# Patient Record
Sex: Female | Born: 1985
Health system: Southern US, Community
[De-identification: ages and names within clinical notes are randomized; demographics above are authoritative.]

## PROBLEM LIST (undated history)

## (undated) ENCOUNTER — Inpatient Hospital Stay (HOSPITAL_COMMUNITY): Payer: Self-pay

## (undated) DIAGNOSIS — IMO0002 Reserved for concepts with insufficient information to code with codable children: Secondary | ICD-10-CM

## (undated) DIAGNOSIS — G621 Alcoholic polyneuropathy: Secondary | ICD-10-CM

## (undated) DIAGNOSIS — Z8489 Family history of other specified conditions: Secondary | ICD-10-CM

## (undated) DIAGNOSIS — F32A Depression, unspecified: Secondary | ICD-10-CM

## (undated) DIAGNOSIS — Z8042 Family history of malignant neoplasm of prostate: Secondary | ICD-10-CM

## (undated) DIAGNOSIS — R000 Tachycardia, unspecified: Secondary | ICD-10-CM

## (undated) DIAGNOSIS — F1090 Alcohol use, unspecified, uncomplicated: Secondary | ICD-10-CM

## (undated) DIAGNOSIS — Z800 Family history of malignant neoplasm of digestive organs: Secondary | ICD-10-CM

## (undated) DIAGNOSIS — F419 Anxiety disorder, unspecified: Secondary | ICD-10-CM

## (undated) DIAGNOSIS — K219 Gastro-esophageal reflux disease without esophagitis: Secondary | ICD-10-CM

## (undated) DIAGNOSIS — R51 Headache: Secondary | ICD-10-CM

## (undated) DIAGNOSIS — B009 Herpesviral infection, unspecified: Secondary | ICD-10-CM

## (undated) DIAGNOSIS — G610 Guillain-Barre syndrome: Secondary | ICD-10-CM

## (undated) DIAGNOSIS — Z803 Family history of malignant neoplasm of breast: Secondary | ICD-10-CM

## (undated) DIAGNOSIS — R87619 Unspecified abnormal cytological findings in specimens from cervix uteri: Secondary | ICD-10-CM

## (undated) DIAGNOSIS — Z973 Presence of spectacles and contact lenses: Secondary | ICD-10-CM

## (undated) DIAGNOSIS — S8980XA Other specified injuries of unspecified lower leg, initial encounter: Secondary | ICD-10-CM

## (undated) DIAGNOSIS — K589 Irritable bowel syndrome without diarrhea: Secondary | ICD-10-CM

## (undated) DIAGNOSIS — F988 Other specified behavioral and emotional disorders with onset usually occurring in childhood and adolescence: Secondary | ICD-10-CM

## (undated) HISTORY — DX: Guillain-Barre syndrome: G61.0

## (undated) HISTORY — DX: Herpesviral infection, unspecified: B00.9

## (undated) HISTORY — DX: Family history of malignant neoplasm of prostate: Z80.42

## (undated) HISTORY — DX: Anxiety disorder, unspecified: F41.9

## (undated) HISTORY — PX: BREAST SURGERY: SHX581

## (undated) HISTORY — DX: Other specified behavioral and emotional disorders with onset usually occurring in childhood and adolescence: F98.8

## (undated) HISTORY — DX: Unspecified abnormal cytological findings in specimens from cervix uteri: R87.619

## (undated) HISTORY — PX: WISDOM TOOTH EXTRACTION: SHX21

## (undated) HISTORY — DX: Family history of malignant neoplasm of digestive organs: Z80.0

## (undated) HISTORY — DX: Reserved for concepts with insufficient information to code with codable children: IMO0002

## (undated) HISTORY — DX: Other specified injuries of unspecified lower leg, initial encounter: S89.80XA

## (undated) HISTORY — DX: Tachycardia, unspecified: R00.0

## (undated) HISTORY — DX: Irritable bowel syndrome, unspecified: K58.9

## (undated) HISTORY — PX: BREAST BIOPSY: SHX20

## (undated) HISTORY — DX: Headache: R51

## (undated) HISTORY — DX: Family history of malignant neoplasm of breast: Z80.3

---
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
## 2003-09-29 ENCOUNTER — Other Ambulatory Visit: Admission: RE | Admit: 2003-09-29 | Discharge: 2003-09-29 | Payer: Self-pay | Admitting: Family Medicine

## 2004-04-02 ENCOUNTER — Emergency Department (HOSPITAL_COMMUNITY): Admission: EM | Admit: 2004-04-02 | Discharge: 2004-04-02 | Payer: Self-pay | Admitting: Family Medicine

## 2004-07-14 ENCOUNTER — Emergency Department (HOSPITAL_COMMUNITY): Admission: EM | Admit: 2004-07-14 | Discharge: 2004-07-15 | Payer: Self-pay | Admitting: Emergency Medicine

## 2004-07-20 ENCOUNTER — Encounter: Admission: RE | Admit: 2004-07-20 | Discharge: 2004-07-20 | Payer: Self-pay | Admitting: Family Medicine

## 2004-11-17 ENCOUNTER — Emergency Department (HOSPITAL_COMMUNITY): Admission: EM | Admit: 2004-11-17 | Discharge: 2004-11-17 | Payer: Self-pay | Admitting: Family Medicine

## 2005-01-24 ENCOUNTER — Ambulatory Visit: Payer: Self-pay | Admitting: Internal Medicine

## 2005-01-25 ENCOUNTER — Ambulatory Visit: Payer: Self-pay | Admitting: Internal Medicine

## 2005-02-08 ENCOUNTER — Ambulatory Visit: Payer: Self-pay | Admitting: Internal Medicine

## 2005-08-18 ENCOUNTER — Other Ambulatory Visit: Admission: RE | Admit: 2005-08-18 | Discharge: 2005-08-18 | Payer: Self-pay | Admitting: Family Medicine

## 2006-08-22 ENCOUNTER — Ambulatory Visit (HOSPITAL_COMMUNITY): Admission: RE | Admit: 2006-08-22 | Discharge: 2006-08-22 | Payer: Self-pay | Admitting: Family Medicine

## 2006-08-22 ENCOUNTER — Emergency Department (HOSPITAL_COMMUNITY): Admission: EM | Admit: 2006-08-22 | Discharge: 2006-08-22 | Payer: Self-pay | Admitting: Family Medicine

## 2006-09-05 ENCOUNTER — Other Ambulatory Visit: Admission: RE | Admit: 2006-09-05 | Discharge: 2006-09-05 | Payer: Self-pay | Admitting: Family Medicine

## 2006-10-11 ENCOUNTER — Emergency Department (HOSPITAL_COMMUNITY): Admission: EM | Admit: 2006-10-11 | Discharge: 2006-10-11 | Payer: Self-pay | Admitting: Family Medicine

## 2007-09-04 ENCOUNTER — Inpatient Hospital Stay (HOSPITAL_COMMUNITY): Admission: EM | Admit: 2007-09-04 | Discharge: 2007-09-06 | Payer: Self-pay | Admitting: Emergency Medicine

## 2007-09-04 ENCOUNTER — Ambulatory Visit: Payer: Self-pay | Admitting: Internal Medicine

## 2008-02-11 ENCOUNTER — Other Ambulatory Visit: Admission: RE | Admit: 2008-02-11 | Discharge: 2008-02-11 | Payer: Self-pay | Admitting: Family Medicine

## 2009-04-27 ENCOUNTER — Emergency Department (HOSPITAL_COMMUNITY): Admission: EM | Admit: 2009-04-27 | Discharge: 2009-04-27 | Payer: Self-pay | Admitting: Emergency Medicine

## 2009-05-01 ENCOUNTER — Other Ambulatory Visit: Admission: RE | Admit: 2009-05-01 | Discharge: 2009-05-01 | Payer: Self-pay | Admitting: Family Medicine

## 2009-06-23 ENCOUNTER — Encounter: Admission: RE | Admit: 2009-06-23 | Discharge: 2009-06-23 | Payer: Self-pay | Admitting: Family Medicine

## 2010-01-28 ENCOUNTER — Emergency Department (HOSPITAL_COMMUNITY): Admission: EM | Admit: 2010-01-28 | Discharge: 2010-01-29 | Payer: Self-pay | Admitting: Emergency Medicine

## 2010-04-06 ENCOUNTER — Other Ambulatory Visit: Admission: RE | Admit: 2010-04-06 | Discharge: 2010-04-06 | Payer: Self-pay | Admitting: Family Medicine

## 2010-06-01 ENCOUNTER — Ambulatory Visit: Payer: Self-pay | Admitting: Gynecology

## 2010-08-27 ENCOUNTER — Emergency Department (HOSPITAL_COMMUNITY): Admission: EM | Admit: 2010-08-27 | Discharge: 2010-08-27 | Payer: Self-pay | Admitting: Family Medicine

## 2010-10-18 ENCOUNTER — Ambulatory Visit: Payer: Self-pay | Admitting: Gynecology

## 2010-10-18 ENCOUNTER — Other Ambulatory Visit
Admission: RE | Admit: 2010-10-18 | Discharge: 2010-10-18 | Payer: Self-pay | Source: Home / Self Care | Admitting: Gynecology

## 2010-11-08 ENCOUNTER — Ambulatory Visit
Admission: RE | Admit: 2010-11-08 | Discharge: 2010-11-08 | Payer: Self-pay | Source: Home / Self Care | Attending: Gynecology | Admitting: Gynecology

## 2010-11-11 ENCOUNTER — Ambulatory Visit: Admit: 2010-11-11 | Payer: Self-pay | Admitting: Gynecology

## 2010-11-17 ENCOUNTER — Ambulatory Visit
Admission: RE | Admit: 2010-11-17 | Discharge: 2010-11-17 | Payer: Self-pay | Source: Home / Self Care | Attending: Gynecology | Admitting: Gynecology

## 2011-02-14 LAB — URINALYSIS, ROUTINE W REFLEX MICROSCOPIC
Bilirubin Urine: NEGATIVE
Glucose, UA: NEGATIVE mg/dL
Ketones, ur: NEGATIVE mg/dL
Leukocytes, UA: NEGATIVE
Nitrite: NEGATIVE
Protein, ur: NEGATIVE mg/dL
Specific Gravity, Urine: 1.002 — ABNORMAL LOW (ref 1.005–1.030)
Urobilinogen, UA: 0.2 mg/dL (ref 0.0–1.0)
pH: 5.5 (ref 5.0–8.0)

## 2011-02-14 LAB — DIFFERENTIAL
Basophils Absolute: 0.1 K/uL (ref 0.0–0.1)
Basophils Relative: 1 % (ref 0–1)
Eosinophils Absolute: 0.4 K/uL (ref 0.0–0.7)
Eosinophils Relative: 5 % (ref 0–5)
Lymphocytes Relative: 37 % (ref 12–46)
Lymphs Abs: 3.6 10*3/uL (ref 0.7–4.0)
Monocytes Absolute: 0.4 10*3/uL (ref 0.1–1.0)
Monocytes Relative: 5 % (ref 3–12)
Neutro Abs: 5.1 10*3/uL (ref 1.7–7.7)
Neutrophils Relative %: 53 % (ref 43–77)

## 2011-02-14 LAB — CBC
HCT: 42.9 % (ref 36.0–46.0)
Hemoglobin: 14.7 g/dL (ref 12.0–15.0)
MCHC: 34.2 g/dL (ref 30.0–36.0)
MCV: 98.7 fL (ref 78.0–100.0)
Platelets: 259 10*3/uL (ref 150–400)
RBC: 4.34 MIL/uL (ref 3.87–5.11)
RDW: 12.4 % (ref 11.5–15.5)
WBC: 9.6 K/uL (ref 4.0–10.5)

## 2011-02-14 LAB — COMPREHENSIVE METABOLIC PANEL
Albumin: 4.2 g/dL (ref 3.5–5.2)
Alkaline Phosphatase: 54 U/L (ref 39–117)
BUN: 8 mg/dL (ref 6–23)
Calcium: 9.4 mg/dL (ref 8.4–10.5)
Glucose, Bld: 96 mg/dL (ref 70–99)
Potassium: 3.7 mEq/L (ref 3.5–5.1)
Sodium: 144 mEq/L (ref 135–145)
Total Protein: 7.1 g/dL (ref 6.0–8.3)

## 2011-02-14 LAB — PROTIME-INR
INR: 1 (ref 0.00–1.49)
Prothrombin Time: 12.8 s (ref 11.6–15.2)

## 2011-02-14 LAB — URINE MICROSCOPIC-ADD ON

## 2011-02-14 LAB — POCT I-STAT, CHEM 8
Calcium, Ion: 1.14 mmol/L (ref 1.12–1.32)
Creatinine, Ser: 0.8 mg/dL (ref 0.4–1.2)
Glucose, Bld: 96 mg/dL (ref 70–99)
HCT: 45 % (ref 36.0–46.0)
Hemoglobin: 15.3 g/dL — ABNORMAL HIGH (ref 12.0–15.0)
TCO2: 22 mmol/L (ref 0–100)

## 2011-02-14 LAB — RAPID URINE DRUG SCREEN, HOSP PERFORMED
Amphetamines: NOT DETECTED
Barbiturates: NOT DETECTED
Benzodiazepines: POSITIVE — AB
Cocaine: NOT DETECTED
Opiates: NOT DETECTED
Tetrahydrocannabinol: NOT DETECTED

## 2011-02-14 LAB — COMPREHENSIVE METABOLIC PANEL WITH GFR
ALT: 15 U/L (ref 0–35)
AST: 20 U/L (ref 0–37)
CO2: 23 meq/L (ref 19–32)
Chloride: 113 meq/L — ABNORMAL HIGH (ref 96–112)
Creatinine, Ser: 0.72 mg/dL (ref 0.4–1.2)
GFR calc Af Amer: >60 mL/min (ref >60)
GFR calc non Af Amer: >60 mL/min (ref >60)
Total Bilirubin: 0.4 mg/dL (ref 0.3–1.2)

## 2011-02-14 LAB — PREGNANCY, URINE: Preg Test, Ur: NEGATIVE

## 2011-02-14 LAB — GLUCOSE, CAPILLARY: Glucose-Capillary: 92 mg/dL (ref 70–99)

## 2011-02-14 LAB — ETHANOL: Alcohol, Ethyl (B): 230 mg/dL — ABNORMAL HIGH (ref 0–10)

## 2011-02-14 LAB — APTT: aPTT: 28 seconds (ref 24–37)

## 2011-03-22 NOTE — Discharge Summary (Signed)
NAMEELEXUS, BARMAN            ACCOUNT NO.:  [id number]   MEDICAL RECORD NO.:  [id number]          PATIENT TYPE:  INP   LOCATION:  1231                         FACILITY:  [facility]   PHYSICIAN:  Corinna L. Lendell Caprice, MDDATE OF BIRTH:  1986/02/04   DATE OF ADMISSION:  09/04/2007  DATE OF DISCHARGE:  09/06/2007                               DISCHARGE SUMMARY   DISCHARGE DIAGNOSES:  1. Accidental overdose of Klonopin.  2. Ventilator-dependent respiratory failure secondary to above.  3. History of migraines.  4. History of attention-deficit disorder. (ADD)  5. History of panic attacks.  6. Urinary tract infection.   DISCHARGE MEDICATIONS:  1. Imitrex 50 mg as needed for migraine.  2. I have recommended that she call her psychiatrist and taper down or      off her Klonopin, but I have instructed her not to stop this      without the assistance of a physician due to likelihood of seizure.  3. Ciprofloxacin 500 mg p.o. b.i.d. until gone.   FOLLOW UP:  Follow up with psychiatrist, see above.  Follow up with Dr.  Valentina Lucks and/or neurologist of choice for migraines.   CONDITION ON DISCHARGE:  Stable.   CONSULTATIONS:  Critical care medicine.   PROCEDURES:  Intubation with mechanical ventilation.   DIET:  Regular.   ACTIVITY:  Ad lib.   LABORATORY DATA AND X-RAY FINDINGS:  Urine drug screen positive for  opiates, benzodiazepines and amphetamines.  Blood alcohol less than 5.  Initial ABG revealed a pH of 7.355, pCO2 41, pO2 79.  CBC on admission  significant for white blood cell count of 15,000, otherwise  unremarkable.  PT/PTT unremarkable.  Complete metabolic panel  unremarkable.  Urine pregnancy negative.  Acetaminophen and salicylate  levels negligible.  Urinalysis revealed positive nitrite, negative  leukocyte esterase, many bacteria, 0-2 wbc's.   CT of brain was normal.  Chest x-ray showed mild bibasilar atelectasis.  EKG showed normal sinus rhythm with low voltage.   HISTORY OF PRESENT ILLNESS:  Please see H&P for complete admission  details.  The patient is a 25 year old, white female who usually sees  Dr. Maurice Small or Corky Mull and was brought to [facility] with a migraine.  She has been suffering from headaches with  photophobia and nausea, but has not seen her primary care physician or  anyone else except for urgent care for this.  She had a migraine and had  taken a Vicodin a few days ago.  She takes Klonopin, but does not recall  taking this other than as prescribed.  She was prescribed Adderall, but  does not take it regularly, according to her mother.  The patient was  noted to be somnolent at the Urgent Care Center and was therefore  referred to the emergency room.  She had normal vital signs and she  became progressively more obtunded and was intubated by the ER physician  to protect her airway.  The EMS notes that she had filled the Klonopin  recently and there were 30 tablets missing.  Critical care was consulted  for vent management.  HOSPITAL COURSE:  The patient was able to be extubated very quickly and  after extubation, she denied depression or suicidal ideation.  She has  never had a history of depression or suicide attempt.  She does suffer  from anxiety and takes Klonopin for this, but she does not recall taking  this improperly.  I questioned whether she may have had a confusional  migraine and took too much Klonopin by accident or possibly the Klonopin  also made her confused.  Apparently, she has been on it daily x3 years.  Due to the severity of this accidental overdose,  I have recommended  that she call her psychiatrist and that they consider tapering down  and/or off her benzodiazepines.  I have also recommended that she follow  up with primary care versus a neurologist for her frequent migraines.   She also was found to have a urinary tract infection and did, in fact,  have dysuria and was started  on Cipro.      Corinna L. Lendell Caprice, MD  Electronically Signed     CLS/MEDQ  D:  09/06/2007  T:  09/07/2007  Job:  [id number]   cc:   Gretta Arab. Valentina Lucks, M.D.  Fax: [phone]

## 2011-03-22 NOTE — H&P (Signed)
Kimberly [name], Kimberly [name]            ACCOUNT NO.:  [id number]   MEDICAL RECORD NO.:  [id number]          PATIENT TYPE:  INP   LOCATION:  1231                         FACILITY:  [facility]   PHYSICIAN:  Della Goo, M.D. DATE OF BIRTH:  [birth year]   DATE OF ADMISSION:  09/04/2007  DATE OF DISCHARGE:                              HISTORY & PHYSICAL   PRIMARY CARE PHYSICIAN:  Dr. Maurice Small, [facility]   CHIEF COMPLAINT:  Unresponsive.   HISTORY OF PRESENT ILLNESS:  This a 25 year old female who was sent  emergently to the emergency department secondary to unresponsiveness  after being evaluated at the [facility].  The  patient has a migraine headache since 2 p.m. and presented to the Urgent  Care Center at 5:30 p.m. for evaluation and treatment.  The patient was  evaluated and treated; however, became increasingly groggy and then  unresponsive.  The patient was sent emergently to the emergency  department at [facility] for further evaluation and treatment.  In the emergency department, the patient was found to have stable vital  signs and stable O2 saturations without respiratory difficulty. However,  the patient initially was responsive to painful stimuli and  progressively became more unresponsive, then unresponsive to painful  stimuli, prompting concern for the patient's airway management, and the  patient was antedated in the emergency department for airway management.   Per the patient's family, who gives the history, the patient has had  migraines since August 2008, and the patient also has had an anxiety  disorder for many years.  They report that the patient just filled a  prescription for Klonopin 1 day ago.  A pill count was performed by the  evaluating ER physician, and 30 tablets are missing.  It is not known  whether the patient took this medication and if this is an overdose on  benzodiazepines.  The patient was administered  Romazicon x1 dose without  changes in her responsiveness.   PAST MEDICAL HISTORY:  Significant for:  1. Migraine headaches.  2. Allergies.  3. Anxiety disorder.   PAST SURGICAL HISTORY:  None.   MEDICATIONS:  Include Imitrex, Maxalt, Midrin, Zyrtec, and clonazepam.  The patient is also on Depo-Provera injections 150 mg every 3 months.   ALLERGIES:  No known drug allergies.   SOCIAL HISTORY:  The patient works in patient care. She is a smoker.  She is occasional drinker per report of her family.   REVIEW OF SYSTEMS:  Pertinent for mentioned above.   PHYSICAL EXAMINATION:  GENERAL:  This is 26 year old, well-nourished,  well-developed female in acute distress.  VITAL SIGNS:  Temperature 98.9, blood pressure 133/86, heart rate 88,  respirations 16, O2 saturation 98-100%.  HEENT:  Normocephalic, atraumatic.  Pupils are sluggish but reactive to  light. Unable to elicit volitional extraocular movements.  Oropharynx is  clear.  Oral mucosa moist.  No exudates or erythema.  NECK:  Supple.  No jugular venous distention.  No thyromegaly.  CARDIOVASCULAR:  Regular rate and rhythm.  No murmurs, gallops or rubs.  LUNGS:  Clear to auscultation bilaterally.  ABDOMEN:  Positive bowel sounds, soft, nontender, nondistended.  EXTREMITIES:  Without cyanosis, clubbing or edema.  NEUROLOGIC:  Unable to perform at this time secondary to the patient's  obtundation.   LABORATORY STUDIES:  White blood cell count 15.0, hemoglobin 12.9,  hematocrit 38.1, platelets 227, neutrophils 85%, MCV 89.6.  Urine  pregnancy negative.  Pro time 14.2, INR 1.1, PTT 29.  Acetaminophen  level 12.2.  Salicylates less than 4.0. Alcohol level less than 5.  Urine drug screen positive opiates, positive benzodiazepines, positive  amphetamines, negative cocaine, negative THC, and negative barbiturates.  Sodium level 135, potassium 3.8, chloride 105, carbon dioxide 20, BUN 8,  creatinine 0.73, glucose 121, albumin 4.0, AST  16, ALT 12. Urinalysis:  Small urine hemoglobin. positive urine nitrites.  Arterial blood gas  prior to intubation was pH 7.355, pCO2 41.4, pO2 79.2, bicarb 22.6 and  O2 saturation 93.4%.   EKG performed reveals a normal sinus rhythm.   Post intubation, the chest x-ray performed revealed bibasilar  atelectasis.   ASSESSMENT:  This is a 25 year old female being admitted with  1. Altered mental status.  2. Possible Klonopin overdose-benzodiazepine overdose.  3. Urinary tract infection.  4. Bibasilar atelectasis-pneumonia  5. Anxiety disorder.  6. History of environmental allergies.   PLAN:  The patient was intubated in the emergency department for airway  management.  She will be monitored for further neurologic changes.  The  patient has been placed on IV antibiotic therapy of Avelox secondary to  findings of bibasilar atelectasis and also secondary to the findings of  a positive urinary tract infection..  Further evaluation will ensue  pending the patient's clinical course.  The patient has been placed on  DVT and GI prophylaxes for now and has been made n.p.o. while she is  intubated.  IV fluids have been ordered for maintenance therapy.  Pulmonary critical care has been consulted for ventilator management  while the patient is intubated.  Further workup will ensue as mentioned  above.      Della Goo, M.D.  Electronically Signed     HJ/MEDQ  D:  09/05/2007  T:  09/05/2007  Job:  [id number]   cc:   Gretta Arab. Valentina Lucks, M.D.  Fax: [phone]

## 2011-03-22 NOTE — H&P (Signed)
NAMEMICAELA, STITH            ACCOUNT NO.:  [id number]   MEDICAL RECORD NO.:  [id number]          PATIENT TYPE:  INP   LOCATION:  1231                         FACILITY:  [facility]   PHYSICIAN:  Della Goo, M.D. DATE OF BIRTH:  Mar 03, 1986   DATE OF ADMISSION:  09/04/2007  DATE OF DISCHARGE:                              HISTORY & PHYSICAL   ADDENDUM:  A CT scan was performed in the emergency department secondary  to patient's unresponsiveness, results of which were negative for any  acute findings.      Della Goo, M.D.  Electronically Signed     HJ/MEDQ  D:  09/05/2007  T:  09/05/2007  Job:  [id number]   cc:   Gretta Arab. Valentina Lucks, M.D.  Fax: [phone]

## 2011-04-07 ENCOUNTER — Other Ambulatory Visit: Payer: Self-pay | Admitting: Gynecology

## 2011-05-12 ENCOUNTER — Encounter: Payer: Self-pay | Admitting: Family Medicine

## 2011-05-12 DIAGNOSIS — B009 Herpesviral infection, unspecified: Secondary | ICD-10-CM | POA: Insufficient documentation

## 2011-08-17 LAB — BLOOD GAS, ARTERIAL
Acid-base deficit: 2.3 — ABNORMAL HIGH
Bicarbonate: 21.5
Bicarbonate: 22.6
MECHVT: 500
O2 Saturation: 100.1
O2 Saturation: 93.4
PEEP: 5
Patient temperature: 98.6
Patient temperature: 98.7
TCO2: 20.4
pH, Arterial: 7.355

## 2011-08-17 LAB — DIFFERENTIAL
Basophils Absolute: 0
Basophils Relative: 0
Eosinophils Absolute: 0
Neutro Abs: 12.8 — ABNORMAL HIGH
Neutrophils Relative %: 85 — ABNORMAL HIGH

## 2011-08-17 LAB — COMPREHENSIVE METABOLIC PANEL
ALT: 12
Alkaline Phosphatase: 53
BUN: 8
CO2: 20
GFR calc non Af Amer: >60
Glucose, Bld: 121 — ABNORMAL HIGH
Potassium: 3.8
Sodium: 135

## 2011-08-17 LAB — URINALYSIS, ROUTINE W REFLEX MICROSCOPIC
Bilirubin Urine: NEGATIVE
Ketones, ur: NEGATIVE
Nitrite: POSITIVE — AB
Specific Gravity, Urine: 1.03
Urobilinogen, UA: 0.2

## 2011-08-17 LAB — PROTIME-INR
INR: 1.1
Prothrombin Time: 14.2

## 2011-08-17 LAB — CBC
HCT: 38.1
Hemoglobin: 12.9
MCHC: 34
RBC: 4.25

## 2011-08-17 LAB — RAPID URINE DRUG SCREEN, HOSP PERFORMED
Amphetamines: POSITIVE — AB
Barbiturates: NOT DETECTED
Cocaine: NOT DETECTED
Opiates: POSITIVE — AB
Tetrahydrocannabinol: NOT DETECTED

## 2011-08-17 LAB — ACETAMINOPHEN LEVEL: Acetaminophen (Tylenol), Serum: 12.2

## 2011-08-17 LAB — URINE MICROSCOPIC-ADD ON

## 2011-08-17 LAB — ETHANOL: Alcohol, Ethyl (B): <5

## 2012-02-20 ENCOUNTER — Ambulatory Visit (HOSPITAL_COMMUNITY): Payer: PRIVATE HEALTH INSURANCE

## 2012-02-20 ENCOUNTER — Ambulatory Visit: Payer: PRIVATE HEALTH INSURANCE | Admitting: Gynecology

## 2012-02-20 VITALS — BP 107/67 | Wt 138.0 lb

## 2012-02-20 DIAGNOSIS — O3680X0 Pregnancy with inconclusive fetal viability, not applicable or unspecified: Secondary | ICD-10-CM

## 2012-02-20 DIAGNOSIS — Z3400 Encounter for supervision of normal first pregnancy, unspecified trimester: Secondary | ICD-10-CM

## 2012-02-21 ENCOUNTER — Ambulatory Visit (HOSPITAL_COMMUNITY)
Admission: RE | Admit: 2012-02-21 | Discharge: 2012-02-21 | Disposition: A | Discharge status: Home / Self Care | Payer: PRIVATE HEALTH INSURANCE | Source: Ambulatory Visit | Attending: Family Medicine | Admitting: Family Medicine

## 2012-02-21 ENCOUNTER — Other Ambulatory Visit: Payer: Self-pay | Admitting: Family Medicine

## 2012-02-21 DIAGNOSIS — O30049 Twin pregnancy, dichorionic/diamniotic, unspecified trimester: Secondary | ICD-10-CM | POA: Insufficient documentation

## 2012-02-21 DIAGNOSIS — Z3689 Encounter for other specified antenatal screening: Secondary | ICD-10-CM | POA: Insufficient documentation

## 2012-02-21 DIAGNOSIS — O30009 Twin pregnancy, unspecified number of placenta and unspecified number of amniotic sacs, unspecified trimester: Secondary | ICD-10-CM | POA: Insufficient documentation

## 2012-02-21 DIAGNOSIS — O3680X0 Pregnancy with inconclusive fetal viability, not applicable or unspecified: Secondary | ICD-10-CM

## 2012-02-21 LAB — OBSTETRIC PANEL
Antibody Screen: NEGATIVE
Basophils Relative: 1 % (ref 0–1)
Eosinophils Absolute: 0.3 10*3/uL (ref 0.0–0.7)
HCT: 38.5 % (ref 36.0–46.0)
Hemoglobin: 12.8 g/dL (ref 12.0–15.0)
MCH: 29.9 pg (ref 26.0–34.0)
MCHC: 33.2 g/dL (ref 30.0–36.0)
Monocytes Absolute: 0.4 10*3/uL (ref 0.1–1.0)
Monocytes Relative: 6 % (ref 3–12)
Neutrophils Relative %: 57 % (ref 43–77)
Rh Type: NEGATIVE

## 2012-02-21 LAB — HIV ANTIBODY (ROUTINE TESTING W REFLEX): HIV: NONREACTIVE

## 2012-02-22 LAB — CULTURE, URINE COMPREHENSIVE: Colony Count: NO GROWTH

## 2012-02-29 ENCOUNTER — Encounter: Payer: Self-pay | Admitting: Obstetrics and Gynecology

## 2012-02-29 ENCOUNTER — Ambulatory Visit (INDEPENDENT_AMBULATORY_CARE_PROVIDER_SITE_OTHER): Payer: PRIVATE HEALTH INSURANCE | Admitting: Obstetrics and Gynecology

## 2012-02-29 VITALS — BP 103/62 | Wt 136.0 lb

## 2012-02-29 DIAGNOSIS — O30009 Twin pregnancy, unspecified number of placenta and unspecified number of amniotic sacs, unspecified trimester: Secondary | ICD-10-CM

## 2012-02-29 DIAGNOSIS — Z124 Encounter for screening for malignant neoplasm of cervix: Secondary | ICD-10-CM

## 2012-02-29 DIAGNOSIS — I499 Cardiac arrhythmia, unspecified: Secondary | ICD-10-CM

## 2012-02-29 DIAGNOSIS — Z113 Encounter for screening for infections with a predominantly sexual mode of transmission: Secondary | ICD-10-CM

## 2012-02-29 DIAGNOSIS — Z3400 Encounter for supervision of normal first pregnancy, unspecified trimester: Secondary | ICD-10-CM

## 2012-02-29 DIAGNOSIS — O0990 Supervision of high risk pregnancy, unspecified, unspecified trimester: Secondary | ICD-10-CM | POA: Insufficient documentation

## 2012-02-29 MED ORDER — PROMETHAZINE HCL 12.5 MG PO TABS: 12.5000 mg | ORAL_TABLET | Freq: Four times a day (QID) | ORAL | Status: DC | PRN

## 2012-02-29 MED ORDER — DOCUSATE SODIUM 100 MG PO CAPS: 100.0000 mg | ORAL_CAPSULE | Freq: Two times a day (BID) | ORAL | Status: AC | PRN

## 2012-02-29 NOTE — Patient Instructions (Signed)
Pregnancy - First Trimester  During sexual intercourse, millions of sperm go into the vagina. Only 1 sperm will penetrate and fertilize the female egg while it is in the Fallopian tube. One week later, the fertilized egg implants into the wall of the uterus. An embryo begins to develop into a baby. At 6 to 8 weeks, the eyes and face are formed and the heartbeat can be seen on ultrasound. At the end of 12 weeks (first trimester), all the baby's organs are formed. Now that you are pregnant, you will want to do everything you can to have a healthy baby. Two of the most important things are to get good prenatal care and follow your caregiver's instructions. Prenatal care is all the medical care you receive before the baby's birth. It is given to prevent, find, and treat problems during the pregnancy and childbirth.  PRENATAL EXAMS   During prenatal visits, your weight, blood pressure and urine are checked. This is done to make sure you are healthy and progressing normally during the pregnancy.   A pregnant woman should gain 25 to 35 pounds during the pregnancy. However, if you are over weight or underweight, your caregiver will advise you regarding your weight.   Your caregiver will ask and answer questions for you.   Blood work, cervical cultures, other necessary tests and a Pap test are done during your prenatal exams. These tests are done to check on your health and the probable health of your baby. Tests are strongly recommended and done for HIV with your permission. This is the virus that causes AIDS. These tests are done because medications can be given to help prevent your baby from being born with this infection should you have been infected without knowing it. Blood work is also used to find out your blood type, previous infections and follow your blood levels (hemoglobin).   Low hemoglobin (anemia) is common during pregnancy. Iron and vitamins are given to help prevent this. Later in the pregnancy, blood  tests for diabetes will be done along with any other tests if any problems develop. You may need tests to make sure you and the baby are doing well.   You may need other tests to make sure you and the baby are doing well.  CHANGES DURING THE FIRST TRIMESTER (THE FIRST 3 MONTHS OF PREGNANCY)  Your body goes through many changes during pregnancy. They vary from person to person. Talk to your caregiver about changes you notice and are concerned about. Changes can include:   Your menstrual period stops.   The egg and sperm carry the genes that determine what you look like. Genes from you and your partner are forming a baby. The female genes determine whether the baby is a boy or a girl.   Your body increases in girth and you may feel bloated.   Feeling sick to your stomach (nauseous) and throwing up (vomiting). If the vomiting is uncontrollable, call your caregiver.   Your breasts will begin to enlarge and become tender.   Your nipples may stick out more and become darker.   The need to urinate more. Painful urination may mean you have a bladder infection.   Tiring easily.   Loss of appetite.   Cravings for certain kinds of food.   At first, you may gain or lose a couple of pounds.   You may have changes in your emotions from day to day (excited to be pregnant or concerned something may go wrong with   the pregnancy and baby).   You may have more vivid and strange dreams.  HOME CARE INSTRUCTIONS    It is very important to avoid all smoking, alcohol and un-prescribed drugs during your pregnancy. These affect the formation and growth of the baby. Avoid chemicals while pregnant to ensure the delivery of a healthy infant.   Start your prenatal visits by the 12th week of pregnancy. They are usually scheduled monthly at first, then more often in the last 2 months before delivery. Keep your caregiver's appointments. Follow your caregiver's instructions regarding medication use, blood and lab tests, exercise, and  diet.   During pregnancy, you are providing food for you and your baby. Eat regular, well-balanced meals. Choose foods such as meat, fish, milk and other low fat dairy products, vegetables, fruits, and whole-grain breads and cereals. Your caregiver will tell you of the ideal weight gain.   You can help morning sickness by keeping soda crackers at the bedside. Eat a couple before arising in the morning. You may want to use the crackers without salt on them.   Eating 4 to 5 small meals rather than 3 large meals a day also may help the nausea and vomiting.   Drinking liquids between meals instead of during meals also seems to help nausea and vomiting.   A physical sexual relationship may be continued throughout pregnancy if there are no other problems. Problems may be early (premature) leaking of amniotic fluid from the membranes, vaginal bleeding, or belly (abdominal) pain.   Exercise regularly if there are no restrictions. Check with your caregiver or physical therapist if you are unsure of the safety of some of your exercises. Greater weight gain will occur in the last 2 trimesters of pregnancy. Exercising will help:   Control your weight.   Keep you in shape.   Prepare you for labor and delivery.   Help you lose your pregnancy weight after you deliver your baby.   Wear a good support or jogging bra for breast tenderness during pregnancy. This may help if worn during sleep too.   Ask when prenatal classes are available. Begin classes when they are offered.   Do not use hot tubs, steam rooms or saunas.   Wear your seat belt when driving. This protects you and your baby if you are in an accident.   Avoid raw meat, uncooked cheese, cat litter boxes and soil used by cats throughout the pregnancy. These carry germs that can cause birth defects in the baby.   The first trimester is a good time to visit your dentist for your dental health. Getting your teeth cleaned is OK. Use a softer toothbrush and brush  gently during pregnancy.   Ask for help if you have financial, counseling or nutritional needs during pregnancy. Your caregiver will be able to offer counseling for these needs as well as refer you for other special needs.   Do not take any medications or herbs unless told by your caregiver.   Inform your caregiver if there is any mental or physical domestic violence.   Make a list of emergency phone numbers of family, friends, hospital, and police and fire departments.   Write down your questions. Take them to your prenatal visit.   Do not douche.   Do not cross your legs.   If you have to stand for long periods of time, rotate you feet or take small steps in a circle.   You may have more vaginal secretions that may   tampons or scented sanitary pads.  MEDICATIONS AND DRUG USE IN PREGNANCY  Take prenatal vitamins as directed. The vitamin should contain 1 milligram of folic acid. Keep all vitamins out of reach of children. Only a couple vitamins or tablets containing iron may be fatal to a baby or young child when ingested.   Avoid use of all medications, including herbs, over-the-counter medications, not prescribed or suggested by your caregiver. Only take over-the-counter or prescription medicines for pain, discomfort, or fever as directed by your caregiver. Do not use aspirin, ibuprofen, or naproxen unless directed by your caregiver.   Let your caregiver also know about herbs you may be using.   Alcohol is related to a number of birth defects. This includes fetal alcohol syndrome. All alcohol, in any form, should be avoided completely. Smoking will cause low birth rate and premature babies.   Street or illegal drugs are very harmful to the baby. They are absolutely forbidden. A baby born to an addicted mother will be addicted at birth. The baby will go through the same withdrawal an adult does.   Let your  caregiver know about any medications that you have to take and for what reason you take them.  MISCARRIAGE IS COMMON DURING PREGNANCY A miscarriage does not mean you did something wrong. It is not a reason to worry about getting pregnant again. Your caregiver will help you with questions you may have. If you have a miscarriage, you may need minor surgery. SEEK MEDICAL CARE IF:  You have any concerns or worries during your pregnancy. It is better to call with your questions if you feel they cannot wait, rather than worry about them. SEEK IMMEDIATE MEDICAL CARE IF:   An unexplained oral temperature above 100.4 F (38 C) develops, or as your caregiver suggests.   You have leaking of fluid from the vagina (birth canal). If leaking membranes are suspected, take your temperature and inform your caregiver of this when you call.   There is vaginal spotting or bleeding. Notify your caregiver of the amount and how many pads are used.   You develop a bad smelling vaginal discharge with a change in the color.   You continue to feel sick to your stomach (nauseated) and have no relief from remedies suggested. You vomit blood or coffee ground-like materials.   You lose more than 2 pounds of weight in 1 week.   You gain more than 2 pounds of weight in 1 week and you notice swelling of your face, hands, feet, or legs.   You gain 5 pounds or more in 1 week (even if you do not have swelling of your hands, face, legs, or feet).   You get exposed to [place] measles and have never had them.   You are exposed to fifth disease or chickenpox.   You develop belly (abdominal) pain. Round ligament discomfort is a common non-cancerous (benign) cause of abdominal pain in pregnancy. Your caregiver still must evaluate this.   You develop headache, fever, diarrhea, pain with urination, or shortness of breath.   You fall or are in a car accident or have any kind of trauma.   There is mental or physical violence in  your home.  Document Released: 10/18/2001 Document Revised: 10/13/2011 Document Reviewed: 04/21/2009 [facility] Patient Information 2012 [place], [place]. Multiple Pregnancy A multiple pregnancy is when a woman is pregnant with two or more fetuses. Multiple pregnancies occur in about 3% of all births. The more babies in a pregnancy, the  greater the risks of problems to the babies and mother. This includes death. Since the use of Assisted Reproductive Technology (ART) and medications that can induce ovulation, multiple fetal gestation has increased.  RISKS TO THE MOTHER  Preeclampsia and eclampsia.   Postpartum bleeding (hemorrhage).   Kidney infection (pyelonephritis).   Develop anemia.   Develop diabetes.   Liver complications.   A blood clot blocks the artery, or branch of the artery leading to the lungs (pulmonary embolism).   Blood clots in the leg.   Placental separation.   Higher rate of Cesarean Section deliveries.   Women over 68 years old have a higher rate of Downs Syndrome babies.  RISKS TO THE BABY  Preterm labor with a premature baby.   Very low birth weight babies that are less than 3 pounds, especially with triplets or mores.   Premature rupture of the membranes.   Twin to twin blood transfusion with one baby anemic and the other baby with too much blood in its system. There may also be heart failure.   With triplets or more, one of the babies is at high risk for cerebral palsy or other neurologic problem.   There is a higher incidence of fetal death.  CARE OF MOTHERS WITH MULTIPLE FETAL GESTATION Multiple pregnancies need more care and special prenatal care.  You will see your caregiver more often.   You will have more tests including ultrasounds, nonstress tests and blood tests.   You will have special tests done called amniocentesis and a biophysical profile.   You may be hospitalized more often during the pregnancy.   You will be encouraged to eat a  balanced and healthy diet with vitamin and mineral supplements as directed.   You will be asked to get more rest and sleep to keep up your energy.   You will be asked to restrict your daily activities, exercise, work, household chores and sexual activity.   If you have preterm labor with small babies, you will be given a steroid injection to help the babies lungs mature and do better when born.   The delivery may have to be by Cesarean delivery, especially if there are triplets or more.   The delivery should be in a hospital with an intensive care nursery and Neonatologists (pediatrician for high risk babies) to care for the newborn babies.  HOME CARE INSTRUCTIONS   Follow the caregiver's recommendations regarding office visits, tests for you and the babies, diet, rest and medications.   Avoid a large amount of physical activity.   Arrange to have help after the babies are born and when you go home from the hospital.   Take classes on how to care for multiple babies before you deliver them.  SEEK IMMEDIATE MEDICAL CARE IF:   You develop a temperature of 100.4 F (38 C) or higher.   You are leaking fluid from the vagina.   You develop vaginal bleeding.   You develop uterine contractions.   You develop a severe headache, severe upper abdominal pain, visual problems or excessive swelling of your face, hands and feet.   You develop severe back pain or leg pain.   You develop severe tiredness.   You develop chest pain.   You have shortness of breath, fall down or pass out.  Document Released: 08/02/2008 Document Revised: 10/13/2011 Document Reviewed: 08/02/2008 [facility] Patient Information 2012 [place], [place].

## 2012-02-29 NOTE — Progress Notes (Signed)
   Subjective:    Kimberly [name] is a G1P0 [redacted]w[redacted]d being seen today for her first obstetrical visit.  Her obstetrical history is significant for Di-Di twin gestation. Patient also diagnosed with irregular heart beat in the past and was placed on Metoprolol for rate control. Patient self discontinued this medication a few months ago. Patient denies chest pain or shortness of breath. Patient does intend to breast feed. Pregnancy history fully reviewed.  Patient reports some nausea and emesis well controlled with Zofran. Patient is complaining of severe constipation.. Otherwise doing well.  Filed Vitals:   02/29/12 1000  BP: 103/62  Weight: 136 lb (61.689 kg)    HISTORY: OB History    Grav Para Term Preterm Abortions TAB SAB Ect Mult Living   1              # Outc Date GA Lbr Len/2nd Wgt Sex Del Anes PTL Lv   1 CUR              Past Medical History  Diagnosis Date  . HSV-1 infection   . Abnormal Pap smear   . Anxiety   . Headache    Past Surgical History  Procedure Date  . Wisdom tooth extraction     x4   Family History  Problem Relation Age of Onset  . Depression Mother     chronic  . Hearing loss Mother   . Hyperlipidemia Mother   . Vision loss Mother     beachets disease  . COPD Father   . Diabetes Father   . Cancer Maternal Grandmother 20    breast cancer  . Parkinsonism Maternal Grandmother   . Heart disease Maternal Grandmother   . Alzheimer's disease Paternal Grandmother   . Heart disease Paternal Grandfather   . Nephrolithiasis Paternal Grandfather   . Cancer Other 60    breast     Exam    Uterus:     Pelvic Exam:    Perineum: Normal Perineum   Vulva: normal   Vagina:  normal mucosa, normal discharge   pH:    Cervix: closed and long   Adnexa: no mass, fullness, tenderness   Bony Pelvis: adequate  System: Breast:  normal appearance, no masses or tenderness   Skin: normal coloration and turgor, no rashes    Neurologic: grossly non-focal   Extremities: no erythema, induration, or nodules   HEENT extra ocular movement intact   Mouth/Teeth mucous membranes moist, pharynx normal without lesions   Neck supple and no masses   Cardiovascular: regular rate and rhythm   Respiratory:  chest clear, no wheezing, crepitations, rhonchi, normal symmetric air entry   Abdomen: soft, non-tender; bowel sounds normal; no masses,  no organomegaly   Urinary:       Assessment:    Pregnancy: G1P0 Patient Active Problem List  Diagnoses  . HSV-1 infection  . Supervision of normal first pregnancy  . Twin pregnancy        Plan:     Initial labs drawn. Prenatal vitamins. Problem list reviewed and updated. Genetic Screening discussed First Screen: requested but would like to confirm coverage with insurance.  Ultrasound discussed; fetal survey: requested. Cardiology referral made to evaluate irregular heart beat  Follow up in 4 weeks.  30 min visit spent on counseling and coordination of care.    Glennon Kopko 02/29/2012

## 2012-02-29 NOTE — Progress Notes (Signed)
Addended by: Catalina Antigua on: 02/29/2012 11:31 AM   Modules accepted: Orders

## 2012-03-02 LAB — CULTURE, OB URINE: Colony Count: 55000

## 2012-03-09 ENCOUNTER — Encounter: Payer: Self-pay | Admitting: Cardiology

## 2012-03-09 ENCOUNTER — Ambulatory Visit (INDEPENDENT_AMBULATORY_CARE_PROVIDER_SITE_OTHER): Payer: PRIVATE HEALTH INSURANCE | Admitting: Cardiology

## 2012-03-09 VITALS — BP 108/74 | HR 113 | Ht 67.0 in | Wt 137.1 lb

## 2012-03-09 DIAGNOSIS — R000 Tachycardia, unspecified: Secondary | ICD-10-CM | POA: Insufficient documentation

## 2012-03-09 LAB — TSH: TSH: 0.12 u[IU]/mL — ABNORMAL LOW (ref 0.35–5.50)

## 2012-03-09 NOTE — Progress Notes (Signed)
HPI The patient presents for evaluation of palpitations. She's had a resting tachycardia for years. She reports wearing the monitor in the past. She reports thyroid studies but no other cardiovascular testing. She was told she had a rapid heart rate and was on a beta blocker. However, now she is [redacted] weeks pregnant with twins. She was taken off the beta blocker and sent here to followup. She does occasionally get isolated skipped heartbeats. This has been a stable pattern over years. She will occasionally get orthostatic type symptoms but she does not have presyncope or syncope. She doesn't feel her rapid heart rate. She is active at work and she denies chest discomfort, neck or arm discomfort. She has no excessive shortness of breath, PND or orthopnea. She has no weight gain or edema.  No Known Allergies  Current Outpatient Prescriptions  Medication Sig Dispense Refill  . almotriptan (AXERT) 6.25 MG tablet Take 6.25 mg by mouth as needed. may repeat in 2 hours if needed       . ALPRAZolam (XANAX) 0.5 MG tablet Take 0.5 mg by mouth at bedtime as needed.        . cetirizine (ZYRTEC) 10 MG tablet Take 10 mg by mouth daily.        . clobetasol (TEMOVATE) 0.05 % ointment Apply topically 2 (two) times daily.        . DiphenhydrAMINE HCl (BENADRYL ALLERGY PO) Take by mouth.      . docusate sodium (COLACE) 100 MG capsule Take 1 capsule (100 mg total) by mouth 2 (two) times daily as needed for constipation.  30 capsule  2  . drospirenone-ethinyl estradiol (YASMIN) 3-0.03 MG per tablet Take 1 tablet by mouth daily.        . methylphenidate (CONCERTA) 36 MG CR tablet Take 36 mg by mouth every morning.        . metoprolol tartrate (LOPRESSOR) 25 MG tablet Take 25 mg by mouth 2 (two) times daily.        . mometasone (NASONEX) 50 MCG/ACT nasal spray Place 2 sprays into the nose daily.        . ondansetron (ZOFRAN) 4 MG tablet Take 4 mg by mouth every 8 (eight) hours as needed.      . Prenatal Vit-Fe  Fumarate-FA (PRENAPLUS) 27-1 MG TABS Take by mouth.      . promethazine (PHENERGAN) 12.5 MG tablet Take 1 tablet (12.5 mg total) by mouth every 6 (six) hours as needed for nausea.  30 tablet  1    Past Medical History  Diagnosis Date  . HSV-1 infection   . Abnormal Pap smear   . Anxiety   . Headache     Past Surgical History  Procedure Date  . Wisdom tooth extraction     x4    Family History  Problem Relation Age of Onset  . Depression Mother     chronic  . Hearing loss Mother   . Hyperlipidemia Mother   . Vision loss Mother     beachets disease  . COPD Father   . Diabetes Father   . Cancer Maternal Grandmother 71    breast cancer  . Parkinsonism Maternal Grandmother   . Heart disease Maternal Grandmother   . Alzheimer's disease Paternal Grandmother   . Heart disease Paternal Grandfather   . Nephrolithiasis Paternal Grandfather   . Cancer Other 60    breast    History   Social History  . Marital Status: Single  Spouse Name: N/A    Number of Children: N/A  . Years of Education: N/A   Occupational History  . Not on file.   Social History Main Topics  . Smoking status: Former Smoker    Quit date: 01/31/2012  . Smokeless tobacco: Not on file  . Alcohol Use: No  . Drug Use: No  . Sexually Active: Yes    Birth Control/ Protection: None   Other Topics Concern  . Not on file   Social History Narrative  . No narrative on file    ROS:  Positive for Nausea, vomiting, constipation, IBS.   Otherwise as stated in the HPI and negative for all other systems.  PHYSICAL EXAM BP 140/92  Pulse 113  Ht 5\' 7"  (1.702 m)  Wt 137 lb 1.6 oz (62.188 kg)  BMI 21.47 kg/m2  LMP 01/02/2012 GENERAL:  Well appearing HEENT:  Pupils equal round and reactive, fundi not visualized, oral mucosa unremarkable NECK:  No jugular venous distention, waveform within normal limits, carotid upstroke brisk and symmetric, no bruits, no thyromegaly LYMPHATICS:  No cervical, inguinal  adenopathy LUNGS:  Clear to auscultation bilaterally BACK:  No CVA tenderness CHEST:  Unremarkable HEART:  PMI not displaced or sustained,S1 and S2 within normal limits, no S3, no S4, no clicks, no rubs, no murmurs ABD:  Flat, positive bowel sounds normal in frequency in pitch, no bruits, no rebound, no guarding, no midline pulsatile mass, no hepatomegaly, no splenomegaly EXT:  2 plus pulses throughout, no edema, no cyanosis no clubbing SKIN:  No rashes no nodules NEURO:  Cranial nerves II through XII grossly intact, motor grossly intact throughout PSYCH:  Cognitively intact, oriented to person place and time  EKG:  Sinus rhythm, rate 113, axis within normal limits, intervals within normal limits, no acute ST-T wave changes.  ASSESSMENT AND PLAN

## 2012-03-09 NOTE — Assessment & Plan Note (Addendum)
At this point I will start with a TSH and an echocardiogram. I would like to avoid a calcium channel blocker or beta blocker in this situation as her heart rate is really at baseline and she's not having any symptoms. However, we will keep a close eye on her for the development of symptoms.  I will plan to see her back in about 6 weeks in followup or sooner if there are any problems.  Of note her heart rate did go up to see if she may have some component of postural orthostatic tachycardia. We discussed this diagnosis.

## 2012-03-09 NOTE — Patient Instructions (Addendum)
The current medical regimen is effective;  continue present plan and medications.  Please have blood work today (TSH)  Your physician has requested that you have an echocardiogram. Echocardiography is a painless test that uses sound waves to create images of your heart. It provides your doctor with information about the size and shape of your heart and how well your heart's chambers and valves are working. This procedure takes approximately one hour. There are no restrictions for this procedure.  Follow up in 2 months with Dr Antoine Poche  Postural Orthostatic Tachycardia Syndrome Postural orthostatic tachycardia syndrome (POTS) is an increased heart rate when going from a lying (supine) position to a standing position. The heart rate may increase more than 30 beats per minute (BPM) above its resting rate when going from a lying to a standing position. POTS occurs more frequently in women than in men.  SYMPTOMS  POTS symptoms may be increased in the morning. Symptoms of POTS include:  Fainting or near fainting.   Inability to think clearly.   Extreme or chronic fatigue.   Exercise intolerance.   Chest pain.   Having the lower legs develop a reddish-blue color due to decreased blood flow (acrocyanosis).  CAUSES POTS can be caused by different conditions. Sometimes, it has no known cause (idiopathic). Some causes of POTS include:  Viral illness.   Pregnancy.   Autoimmune diseases.   Medications.   Major surgery.   Trauma such as a car accident or major injury.   Medical conditions such as anemia, dehydration, and hyperthyroidism.  DIAGNOSIS  POTS is diagnosed by:  Taking a complete history and physical exam.   Measuring the heart rate while lying and then upon standing.   Measuring blood pressure when going from a lying to a standing position. POTS is usually not associated with low blood pressure (othostatic hypotention) when going from a lying to standing position. While  standing, blood pressure should be taken 2, 5, and 10 minutes after getting up.  TREATMENT  Treatment of POTS depends upon the severity of the symptoms. Treatment includes:  Drinking plenty of fluids to avoid getting dehydrated.   Avoiding very hot environments to not get overheated.   Increasing your dietary salt intake as instructed by your caregiver.   Taking different types of medications as prescribed for POTS.   Avoiding some classes of medications such as vasodilators and diuretics.  SEEK IMMEDIATE MEDICAL CARE IF  You have severe chest pain that does not go away. Call your local emergency service immediately.   You feel your heart racing or beating rapidly.   You feel like passing out.   You have very confused thinking.  MAKE SURE YOU  Understand these instructions.   Will watch your condition.   Will get help right away if you are not doing well or get worse.  Document Released: 10/14/2002 Document Revised: 10/13/2011 Document Reviewed: 12/22/2010 [facility] Patient Information 2012 [place], [place].

## 2012-03-14 ENCOUNTER — Other Ambulatory Visit: Payer: Self-pay | Admitting: *Deleted

## 2012-03-14 DIAGNOSIS — R7989 Other specified abnormal findings of blood chemistry: Secondary | ICD-10-CM

## 2012-03-15 ENCOUNTER — Other Ambulatory Visit: Payer: Self-pay | Admitting: *Deleted

## 2012-03-15 ENCOUNTER — Other Ambulatory Visit: Payer: PRIVATE HEALTH INSURANCE

## 2012-03-15 DIAGNOSIS — R7989 Other specified abnormal findings of blood chemistry: Secondary | ICD-10-CM

## 2012-03-16 LAB — T4: T4, Total: 13.4 ug/dL — ABNORMAL HIGH (ref 5.0–12.5)

## 2012-03-20 ENCOUNTER — Telehealth: Payer: Self-pay | Admitting: Cardiology

## 2012-03-20 NOTE — Telephone Encounter (Signed)
New Problem:     Patient called in returning Dr. Jenene Slicker call. Please call back.

## 2012-03-20 NOTE — Telephone Encounter (Signed)
Per pt - sees [facility].  Has only seen Dr Jolayne Panther so far and isn't assigned to one MD in particular as fas as she knows.  Aware Dr Antoine Poche will contact them to discuss follow up of abnormal TSH, T3 and T4.

## 2012-03-20 NOTE — Telephone Encounter (Signed)
Follow up: ° ° ° °Patient called in wanting to speak with you. Please call back. °

## 2012-03-21 ENCOUNTER — Ambulatory Visit (HOSPITAL_COMMUNITY): Payer: PRIVATE HEALTH INSURANCE | Attending: Cardiology

## 2012-03-21 ENCOUNTER — Other Ambulatory Visit: Payer: Self-pay

## 2012-03-21 DIAGNOSIS — O99891 Other specified diseases and conditions complicating pregnancy: Secondary | ICD-10-CM | POA: Insufficient documentation

## 2012-03-21 DIAGNOSIS — R000 Tachycardia, unspecified: Secondary | ICD-10-CM

## 2012-03-22 ENCOUNTER — Ambulatory Visit (INDEPENDENT_AMBULATORY_CARE_PROVIDER_SITE_OTHER): Payer: PRIVATE HEALTH INSURANCE | Admitting: Obstetrics & Gynecology

## 2012-03-22 VITALS — BP 96/68 | Wt 138.0 lb

## 2012-03-22 DIAGNOSIS — O99280 Endocrine, nutritional and metabolic diseases complicating pregnancy, unspecified trimester: Secondary | ICD-10-CM | POA: Insufficient documentation

## 2012-03-22 DIAGNOSIS — E0590 Thyrotoxicosis, unspecified without thyrotoxic crisis or storm: Secondary | ICD-10-CM | POA: Insufficient documentation

## 2012-03-22 DIAGNOSIS — E079 Disorder of thyroid, unspecified: Secondary | ICD-10-CM

## 2012-03-22 DIAGNOSIS — O30009 Twin pregnancy, unspecified number of placenta and unspecified number of amniotic sacs, unspecified trimester: Secondary | ICD-10-CM

## 2012-03-22 DIAGNOSIS — O0990 Supervision of high risk pregnancy, unspecified, unspecified trimester: Secondary | ICD-10-CM

## 2012-03-22 MED ORDER — METHIMAZOLE 5 MG PO TABS: 5.0000 mg | ORAL_TABLET | Freq: Every day | ORAL | Status: DC

## 2012-03-22 NOTE — Patient Instructions (Signed)
Hyperthyroidism The thyroid is a large gland located in the lower front part of your neck. The thyroid helps control metabolism. Metabolism is how your body uses food. It controls metabolism with the hormone thyroxine. When the thyroid is overactive, it produces too much hormone. When this happens, these following problems may occur:   Nervousness   Heat intolerance   Weight loss (in spite of increase food intake)   Diarrhea   Change in hair or skin texture   Palpitations (heart skipping or having extra beats)   Tachycardia (rapid heart rate)   Loss of menstruation (amenorrhea)   Shaking of the hands  CAUSES  Grave's Disease (the immune system attacks the thyroid gland). This is the most common cause.   Inflammation of the thyroid gland.   Tumor (usually benign) in the thyroid gland or elsewhere.   Excessive use of thyroid medications (both prescription and 'natural').   Excessive ingestion of Iodine.  DIAGNOSIS  To prove hyperthyroidism, your caregiver may do blood tests and ultrasound tests. Sometimes the signs are hidden. It may be necessary for your caregiver to watch this illness with blood tests, either before or after diagnosis and treatment. TREATMENT Short-term treatment There are several treatments to control symptoms. Drugs called beta blockers may give some relief. Drugs that decrease hormone production will provide temporary relief in many people. These measures will usually not give permanent relief. Definitive therapy There are treatments available which can be discussed between you and your caregiver which will permanently treat the problem. These treatments range from surgery (removal of the thyroid), to the use of radioactive iodine (destroys the thyroid by radiation), to the use of antithyroid drugs (interfere with hormone synthesis). The first two treatments are permanent and usually successful. They most often require hormone replacement therapy for life.  This is because it is impossible to remove or destroy the exact amount of thyroid required to make a person euthyroid (normal). HOME CARE INSTRUCTIONS  See your caregiver if the problems you are being treated for get worse. Examples of this would be the problems listed above. SEEK MEDICAL CARE IF: Your general condition worsens. MAKE SURE YOU:   Understand these instructions.   Will watch your condition.   Will get help right away if you are not doing well or get worse.  Document Released: 10/24/2005 Document Revised: 10/13/2011 Document Reviewed: 03/07/2007  Thyroid Diseases Your thyroid is a butterfly-shaped gland in your neck. It is located just above your collarbone. It is one of your endocrine glands, which make hormones. The thyroid helps set your metabolism. Metabolism is how your body gets energy from the foods you eat.  Millions of people have thyroid diseases. Women experience thyroid problems more often than men. In fact, overactive thyroid problems (hyperthyroidism) occur in 1% of all women. If you have a thyroid disease, your body may use energy more slowly or quickly than it should.  Thyroid problems also include an immune disease where your body reacts against your thyroid gland (called thyroiditis). A different problem involves lumps and bumps (called nodules) that develop in the gland. The nodules are usually, but not always, noncancerous. THE MOST COMMON THYROID PROBLEMS AND CAUSES ARE DISCUSSED BELOW There are many causes for thyroid problems. Treatment depends upon the exact diagnosis and includes trying to reset your body's metabolism to a normal rate. Hyperthyroidism Too much thyroid hormone from an overactive thyroid gland is called hyperthyroidism. In hyperthyroidism, the body's metabolism speeds up. One of the most frequent forms of  hyperthyroidism is known as Graves' disease. Graves' disease tends to run in families. Although Graves' is thought to be caused by a problem  with the immune system, the exact nature of the genetic problem is unknown. Hypothyroidism Too little thyroid hormone from an underactive thyroid gland is called hypothyroidism. In hypothyroidism, the body's metabolism is slowed. Several things can cause this condition. Most causes affect the thyroid gland directly and hurt its ability to make enough hormone.  Rarely, there may be a pituitary gland tumor (located near the base of the brain). The tumor can block the pituitary from producing thyroid-stimulating hormone (TSH). Your body makes TSH to stimulate the thyroid to work properly. If the pituitary does not make enough TSH, the thyroid fails to make enough hormones needed for good health. Whether the problem is caused by thyroid conditions or by the pituitary gland, the result is that the thyroid is not making enough hormones. Hypothyroidism causes many physical and mental processes to become sluggish. The body consumes less oxygen and produces less body heat. Thyroid Nodules A thyroid nodule is a small swelling or lump in the thyroid gland. They are common. These nodules represent either a growth of thyroid tissue or a fluid-filled cyst. Both form a lump in the thyroid gland. Almost half of all people will have tiny thyroid nodules at some point in their lives. Typically, these are not noticeable until they become large and affect normal thyroid size. Larger nodules that are greater than a half inch across (about 1 centimeter) occur in about 5 percent of people. Although most nodules are not cancerous, people who have them should seek medical care to rule out cancer. Also, some thyroid nodules may produce too much thyroid hormone or become too large. Large nodules or a large gland can interfere with breathing or swallowing or may cause neck discomfort. Other problems Other thyroid problems include cancer and thyroiditis. Thyroiditis is a malfunction of the body's immune system. Normally, the immune  system works to defend the body against infection and other problems. When the immune system is not working properly, it may mistakenly attack normal cells, tissues, and organs. Examples of autoimmune diseases are Hashimoto's thyroiditis (which causes low thyroid function) and Graves' disease (which causes excess thyroid function). SYMPTOMS  Symptoms vary greatly depending upon the exact type of problem with the thyroid. Hyperthyroidism-is when your thyroid is too active and makes more thyroid hormone than your body needs. The most common cause is Graves' Disease. Too much thyroid hormone can cause some or all of the following symptoms:  Anxiety.   Irritability.   Difficulty sleeping.   Fatigue.   A rapid or irregular heartbeat.   A fine tremor of your hands or fingers.   An increase in perspiration.   Sensitivity to heat.   Weight loss, despite normal food intake.   Brittle hair.   Enlargement of your thyroid gland (goiter).   Light menstrual periods.   Frequent bowel movements.  Graves' disease can specifically cause eye and skin problems. The skin problems involve reddening and swelling of the skin, often on your shins and on the top of your feet. Eye problems can include the following:  Excess tearing and sensation of grit or sand in either or both eyes.   Reddened or inflamed eyes.   Widening of the space between your eyelids.   Swelling of the lids and tissues around the eyes.   Light sensitivity.   Ulcers on the cornea.   Double vision.  Limited eye movements.   Blurred or reduced vision.  Hypothyroidism- is when your thyroid gland is not active enough. This is more common than hyperthyroidism. Symptoms can vary a lot depending of the severity of the hormone deficiency. Symptoms may develop over a long period of time and can include several of the following:  Fatigue.   Sluggishness.   Increased sensitivity to cold.   Constipation.   Pale, dry skin.     A puffy face.   Hoarse voice.   High blood cholesterol level.   Unexplained weight gain.   Muscle aches, tenderness and stiffness.   Pain, stiffness or swelling in your joints.   Muscle weakness.   Heavier than normal menstrual periods.   Brittle fingernails and hair.   Depression.  Thyroid Nodules - most do not cause signs or symptoms. Occasionally, some may become so large that you can feel or even see the swelling at the base of your neck. You may realize a lump or swelling is there when you are shaving or putting on makeup. Men might become aware of a nodule when shirt collars suddenly feel too tight. Some nodules produce too much thyroid hormone. This can produce the same symptoms as hyperthyroidism (see above). Thyroid nodules are seldom cancerous. However, a nodule is more likely to be malignant (cancerous) if it:  Grows quickly or feels hard.   Causes you to become hoarse or to have trouble swallowing or breathing.   Causes enlarged lymph nodes under your jaw or in your neck.  DIAGNOSIS  Because there are so many possible thyroid conditions, your caregiver may ask for a number of tests. They will do this in order to narrow down the exact diagnosis. These tests can include:  Blood and antibody tests.   Special thyroid scans using small, safe amounts of radioactive iodine.   Ultrasound of the thyroid gland (particularly if there is a nodule or lump).   Biopsy. This is usually done with a special needle. A needle biopsy is a procedure to obtain a sample of cells from the thyroid. The tissue will be tested in a lab and examined under a microscope.  TREATMENT  Treatment depends on the exact diagnosis. Hyperthyroidism  Beta-blockers help relieve many of the symptoms.   Anti-thyroid medications prevent the thyroid from making excess hormones.   Radioactive iodine treatment can destroy overactive thyroid cells. The iodine can permanently decrease the amount of  hormone produced.   Surgery to remove the thyroid gland.   Treatments for eye problems that come from Graves' disease also include medications and special eye surgery, if felt to be appropriate.  Hypothyroidism Thyroid replacement with levothyroxine is the mainstay of treatment. Treatment with thyroid replacement is usually lifelong and will require monitoring and adjustment from time to time. Thyroid Nodules  Watchful waiting. If a small nodule causes no symptoms or signs of cancer on biopsy, then no treatment may be chosen at first. Re-exam and re-checking blood tests would be the recommended follow-up.   Anti-thyroid medications or radioactive iodine treatment may be recommended if the nodules produce too much thyroid hormone (see Treatment for Hyperthyroidism above).   Alcohol ablation. Injections of small amounts of ethyl alcohol (ethanol) can cause a non-cancerous nodule to shrink in size.   Surgery (see Treatment for Hyperthyroidism above).  HOME CARE INSTRUCTIONS   Take medications as instructed.   Follow through on recommended testing.  SEEK MEDICAL CARE IF:   You feel that you are developing symptoms of Hyperthyroidism  or Hypothyroidism as described above.   You develop a new lump/nodule in the neck/thyroid area that you had not noticed before.   You feel that you are having side effects from medicines prescribed.   You develop trouble breathing or swallowing.  SEEK IMMEDIATE MEDICAL CARE IF:   You develop a fever of 102 F (38.9 C) or higher.   You develop severe sweating.   You develop palpitations and/or rapid heart beat.   You develop shortness of breath.   You develop nausea and vomiting.   You develop extreme shakiness.   You develop agitation.   You develop lightheadedness or have a fainting episode.  Document Released: 08/21/2007 Document Revised: 10/13/2011 Document Reviewed: 08/21/2007 [facility] Patient Information 2012 [place], [place].

## 2012-03-22 NOTE — Progress Notes (Signed)
Patient was seen by [facility]. EKG showed sinus tachycardia, normal ECHO with EF of 65%.  03/15/12 TSH 0.12, T3 233.9, T4 13.4, which is diagnostic of hyperthyroidism.  Patient reports occasional palpitations; Cardiology did not want her on beta-blockers because of concern about SGA/IUGR infants.  Discussed patient with Dr. Tinnie Gens (Family Medicine/Obstetrician) and Dr. Rema Fendt (Maternal Fetal Medicine) who recommended starting Methimazole 5 mg po daily in second trimester and serial surveillance of thyroid function tests.  Referral to Endocrinology also ordered; she will also have formal consultation with MFM during her first trimester screening.  Patient denies any chest pain or SOB. Signs/symptoms of thyrotoxicosis reviewed.  Methimazole ordered to start at [redacted] weeks GA. Return to clinic in 2 weeks for further discussion and evaluation.

## 2012-03-27 ENCOUNTER — Telehealth: Payer: Self-pay | Admitting: *Deleted

## 2012-03-27 ENCOUNTER — Other Ambulatory Visit: Payer: Self-pay | Admitting: Obstetrics and Gynecology

## 2012-03-27 ENCOUNTER — Ambulatory Visit: Payer: PRIVATE HEALTH INSURANCE | Admitting: Cardiology

## 2012-03-27 DIAGNOSIS — Z3682 Encounter for antenatal screening for nuchal translucency: Secondary | ICD-10-CM

## 2012-03-27 NOTE — Telephone Encounter (Signed)
Called patient and left message for her to call us back regarding the medication Dr. Macon Large called in for her.  Also to let her know that we put in her referral to endocrinology and are awaiting a phone call back from them.

## 2012-03-30 ENCOUNTER — Ambulatory Visit (HOSPITAL_COMMUNITY)
Admission: RE | Admit: 2012-03-30 | Discharge: 2012-03-30 | Disposition: A | Discharge status: Home / Self Care | Payer: PRIVATE HEALTH INSURANCE | Source: Ambulatory Visit | Attending: Obstetrics and Gynecology | Admitting: Obstetrics and Gynecology

## 2012-03-30 ENCOUNTER — Other Ambulatory Visit: Payer: Self-pay | Admitting: Obstetrics and Gynecology

## 2012-03-30 VITALS — BP 101/65 | Wt 138.0 lb

## 2012-03-30 DIAGNOSIS — Z3682 Encounter for antenatal screening for nuchal translucency: Secondary | ICD-10-CM

## 2012-03-30 DIAGNOSIS — O30009 Twin pregnancy, unspecified number of placenta and unspecified number of amniotic sacs, unspecified trimester: Secondary | ICD-10-CM

## 2012-03-30 DIAGNOSIS — O3510X0 Maternal care for (suspected) chromosomal abnormality in fetus, unspecified, not applicable or unspecified: Secondary | ICD-10-CM | POA: Insufficient documentation

## 2012-03-30 DIAGNOSIS — O0990 Supervision of high risk pregnancy, unspecified, unspecified trimester: Secondary | ICD-10-CM

## 2012-03-30 DIAGNOSIS — E0590 Thyrotoxicosis, unspecified without thyrotoxic crisis or storm: Secondary | ICD-10-CM

## 2012-03-30 DIAGNOSIS — O351XX0 Maternal care for (suspected) chromosomal abnormality in fetus, not applicable or unspecified: Secondary | ICD-10-CM | POA: Insufficient documentation

## 2012-03-30 DIAGNOSIS — Z3689 Encounter for other specified antenatal screening: Secondary | ICD-10-CM | POA: Insufficient documentation

## 2012-04-09 ENCOUNTER — Other Ambulatory Visit: Payer: Self-pay | Admitting: Obstetrics and Gynecology

## 2012-04-09 DIAGNOSIS — O0990 Supervision of high risk pregnancy, unspecified, unspecified trimester: Secondary | ICD-10-CM

## 2012-04-19 ENCOUNTER — Ambulatory Visit (INDEPENDENT_AMBULATORY_CARE_PROVIDER_SITE_OTHER): Payer: PRIVATE HEALTH INSURANCE | Admitting: Obstetrics & Gynecology

## 2012-04-19 ENCOUNTER — Encounter: Payer: Self-pay | Admitting: Obstetrics & Gynecology

## 2012-04-19 ENCOUNTER — Other Ambulatory Visit: Payer: Self-pay | Admitting: Obstetrics & Gynecology

## 2012-04-19 VITALS — BP 116/64 | Wt 140.0 lb

## 2012-04-19 DIAGNOSIS — Z3480 Encounter for supervision of other normal pregnancy, unspecified trimester: Secondary | ICD-10-CM

## 2012-04-19 DIAGNOSIS — O30009 Twin pregnancy, unspecified number of placenta and unspecified number of amniotic sacs, unspecified trimester: Secondary | ICD-10-CM

## 2012-04-19 DIAGNOSIS — E0590 Thyrotoxicosis, unspecified without thyrotoxic crisis or storm: Secondary | ICD-10-CM

## 2012-04-19 DIAGNOSIS — E079 Disorder of thyroid, unspecified: Secondary | ICD-10-CM

## 2012-04-19 NOTE — Progress Notes (Signed)
Patient is here for routine prenatal check for twins.  Methamizole is helping her a lot with symptoms.

## 2012-04-19 NOTE — Progress Notes (Signed)
Routine visit. No complaints. Methimizole helping symptoms. Darl Pikes is calling again today to get a endocrinology appt. I will check TSH today as well as her MSAFP. I am ordering her anatomy scan for 18 1/[redacted] weeks EGA.

## 2012-04-25 LAB — ALPHA FETOPROTEIN, MATERNAL: MoM for AFP: 2.29

## 2012-04-26 ENCOUNTER — Other Ambulatory Visit: Payer: Self-pay | Admitting: Obstetrics and Gynecology

## 2012-05-14 ENCOUNTER — Ambulatory Visit (HOSPITAL_COMMUNITY)
Admission: RE | Admit: 2012-05-14 | Discharge: 2012-05-14 | Disposition: A | Discharge status: Home / Self Care | Payer: PRIVATE HEALTH INSURANCE | Source: Ambulatory Visit | Attending: Obstetrics & Gynecology | Admitting: Obstetrics & Gynecology

## 2012-05-14 DIAGNOSIS — Z363 Encounter for antenatal screening for malformations: Secondary | ICD-10-CM | POA: Insufficient documentation

## 2012-05-14 DIAGNOSIS — Z1389 Encounter for screening for other disorder: Secondary | ICD-10-CM | POA: Insufficient documentation

## 2012-05-14 DIAGNOSIS — O30009 Twin pregnancy, unspecified number of placenta and unspecified number of amniotic sacs, unspecified trimester: Secondary | ICD-10-CM

## 2012-05-14 DIAGNOSIS — O358XX0 Maternal care for other (suspected) fetal abnormality and damage, not applicable or unspecified: Secondary | ICD-10-CM | POA: Insufficient documentation

## 2012-05-15 ENCOUNTER — Ambulatory Visit: Payer: PRIVATE HEALTH INSURANCE | Admitting: Cardiology

## 2012-05-21 ENCOUNTER — Encounter: Payer: Self-pay | Admitting: Obstetrics & Gynecology

## 2012-05-22 ENCOUNTER — Ambulatory Visit (INDEPENDENT_AMBULATORY_CARE_PROVIDER_SITE_OTHER): Payer: PRIVATE HEALTH INSURANCE | Admitting: Obstetrics & Gynecology

## 2012-05-22 VITALS — BP 89/60 | Wt 147.4 lb

## 2012-05-22 DIAGNOSIS — O0990 Supervision of high risk pregnancy, unspecified, unspecified trimester: Secondary | ICD-10-CM

## 2012-05-22 DIAGNOSIS — O99280 Endocrine, nutritional and metabolic diseases complicating pregnancy, unspecified trimester: Secondary | ICD-10-CM

## 2012-05-22 DIAGNOSIS — E0590 Thyrotoxicosis, unspecified without thyrotoxic crisis or storm: Secondary | ICD-10-CM

## 2012-05-22 DIAGNOSIS — O30009 Twin pregnancy, unspecified number of placenta and unspecified number of amniotic sacs, unspecified trimester: Secondary | ICD-10-CM

## 2012-05-22 DIAGNOSIS — E079 Disorder of thyroid, unspecified: Secondary | ICD-10-CM

## 2012-05-22 NOTE — Progress Notes (Signed)
Patient was reassured that leaking from breast is normal during pregnancy.  Has not seen endocrinology yet due to insurance concerns, she will call them to find out which specialists are covered under her plan. Will check TSH today, continue Methimazole for now.  Normal anatomy scan x 2.  Patient asking for work restriction letter; she was advised to talk to her employer and bring back any pertinent paperwork for [name] to fill out.  Follow up growth scan scheduled for around [redacted] weeks GA at MFM.  No other complaints or concerns.  Fetal movement and labor precautions reviewed.

## 2012-05-22 NOTE — Progress Notes (Signed)
Started leaking out of breast.

## 2012-05-22 NOTE — Patient Instructions (Signed)
Breastfeeding BENEFITS OF BREASTFEEDING For the baby  The first milk (colostrum) helps the baby's digestive system function better.   There are antibodies from the mother in the milk that help the baby fight off infections.   The baby has a lower incidence of asthma, allergies, and SIDS (sudden infant death syndrome).   The nutrients in breast milk are better than formulas for the baby and helps the baby's brain grow better.   Babies who breastfeed have less gas, colic, and constipation.  For the mother  Breastfeeding helps develop a very special bond between mother and baby.   It is more convenient, always available at the correct temperature and cheaper than formula feeding.   It burns calories in the mother and helps with losing weight that was gained during pregnancy.   It makes the uterus contract back down to normal size faster and slows bleeding following delivery.   Breastfeeding mothers have a lower risk of developing breast cancer.  NURSE FREQUENTLY  A healthy, full-term baby may breastfeed as often as every hour or space his or her feedings to every 3 hours.   How often to nurse will vary from baby to baby. Watch your baby for signs of hunger, not the clock.   Nurse as often as the baby requests, or when you feel the need to reduce the fullness of your breasts.   Awaken the baby if it has been 3 to 4 hours since the last feeding.   Frequent feeding will help the mother make more milk and will prevent problems like sore nipples and engorgement of the breasts.  BABY'S POSITION AT THE BREAST  Whether lying down or sitting, be sure that the baby's tummy is facing your tummy.   Support the breast with 4 fingers underneath the breast and the thumb above. Make sure your fingers are well away from the nipple and baby's mouth.   Stroke the baby's lips and cheek closest to the breast gently with your finger or nipple.   When the baby's mouth is open wide enough, place all  of your nipple and as much of the dark area around the nipple as possible into your baby's mouth.   Pull the baby in close so the tip of the nose and the baby's cheeks touch the breast during the feeding.  FEEDINGS  The length of each feeding varies from baby to baby and from feeding to feeding.   The baby must suck about 2 to 3 minutes for your milk to get to him or her. This is called a "let down." For this reason, allow the baby to feed on each breast as long as he or she wants. Your baby will end the feeding when he or she has received the right balance of nutrients.   To break the suction, put your finger into the corner of the baby's mouth and slide it between his or her gums before removing your breast from his or her mouth. This will help prevent sore nipples.  REDUCING BREAST ENGORGEMENT  In the first week after your baby is born, you may experience signs of breast engorgement. When breasts are engorged, they feel heavy, warm, full, and may be tender to the touch. You can reduce engorgement if you:   Nurse frequently, every 2 to 3 hours. Mothers who breastfeed early and often have fewer problems with engorgement.   Place light ice packs on your breasts between feedings. This reduces swelling. Wrap the ice packs in a   lightweight towel to protect your skin.   Apply moist hot packs to your breast for 5 to 10 minutes before each feeding. This increases circulation and helps the milk flow.   Gently massage your breast before and during the feeding.   Make sure that the baby empties at least one breast at every feeding before switching sides.   Use a breast pump to empty the breasts if your baby is sleepy or not nursing well. You may also want to pump if you are returning to work or or you feel you are getting engorged.   Avoid bottle feeds, pacifiers or supplemental feedings of water or juice in place of breastfeeding.   Be sure the baby is latched on and positioned properly while  breastfeeding.   Prevent fatigue, stress, and anemia.   Wear a supportive bra, avoiding underwire styles.   Eat a balanced diet with enough fluids.  If you follow these suggestions, your engorgement should improve in 24 to 48 hours. If you are still experiencing difficulty, call your lactation consultant or caregiver. IS MY BABY GETTING ENOUGH MILK? Sometimes, mothers worry about whether their babies are getting enough milk. You can be assured that your baby is getting enough milk if:  The baby is actively sucking and you hear swallowing.   The baby nurses at least 8 to 12 times in a 24 hour time period. Nurse your baby until he or she unlatches or falls asleep at the first breast (at least 10 to 20 minutes), then offer the second side.   The baby is wetting 5 to 6 disposable diapers (6 to 8 cloth diapers) in a 24 hour period by 5 to 6 days of age.   The baby is having at least 2 to 3 stools every 24 hours for the first few months. Breast milk is all the food your baby needs. It is not necessary for your baby to have water or formula. In fact, to help your breasts make more milk, it is best not to give your baby supplemental feedings during the early weeks.   The stool should be soft and yellow.   The baby should gain 4 to 7 ounces per week after he is 4 days old.  TAKE CARE OF YOURSELF Take care of your breasts by:  Bathing or showering daily.   Avoiding the use of soaps on your nipples.   Start feedings on your left breast at one feeding and on your right breast at the next feeding.   You will notice an increase in your milk supply 2 to 5 days after delivery. You may feel some discomfort from engorgement, which makes your breasts very firm and often tender. Engorgement "peaks" out within 24 to 48 hours. In the meantime, apply warm moist towels to your breasts for 5 to 10 minutes before feeding. Gentle massage and expression of some milk before feeding will soften your breasts, making  it easier for your baby to latch on. Wear a well fitting nursing bra and air dry your nipples for 10 to 15 minutes after each feeding.   Only use cotton bra pads.   Only use pure lanolin on your nipples after nursing. You do not need to wash it off before nursing.  Take care of yourself by:   Eating well-balanced meals and nutritious snacks.   Drinking milk, fruit juice, and water to satisfy your thirst (about 8 glasses a day).   Getting plenty of rest.   Increasing calcium in   your diet (1200 mg a day).   Avoiding foods that you notice affect the baby in a bad way.  SEEK MEDICAL CARE IF:   You have any questions or difficulty with breastfeeding.   You need help.   You have a hard, red, sore area on your breast, accompanied by a fever of 100.5 F (38.1 C) or more.   Your baby is too sleepy to eat well or is having trouble sleeping.   Your baby is wetting less than 6 diapers per day, by 5 days of age.   Your baby's skin or white part of his or her eyes is more yellow than it was in the hospital.   You feel depressed.  Document Released: 10/24/2005 Document Revised: 10/13/2011 Document Reviewed: 06/08/2009 [employer] Patient Information 2012 [employer]. 

## 2012-05-23 ENCOUNTER — Encounter: Payer: Self-pay | Admitting: Obstetrics & Gynecology

## 2012-05-23 LAB — TSH: TSH: 3.438 u[IU]/mL (ref 0.350–4.500)

## 2012-05-31 ENCOUNTER — Encounter (HOSPITAL_COMMUNITY): Payer: Self-pay | Admitting: *Deleted

## 2012-05-31 ENCOUNTER — Inpatient Hospital Stay (HOSPITAL_COMMUNITY)
Admission: AD | Admit: 2012-05-31 | Discharge: 2012-05-31 | Disposition: A | Discharge status: Home / Self Care | Payer: PRIVATE HEALTH INSURANCE | Source: Ambulatory Visit | Attending: Obstetrics & Gynecology | Admitting: Obstetrics & Gynecology

## 2012-05-31 DIAGNOSIS — O2390 Unspecified genitourinary tract infection in pregnancy, unspecified trimester: Secondary | ICD-10-CM | POA: Insufficient documentation

## 2012-05-31 DIAGNOSIS — N949 Unspecified condition associated with female genital organs and menstrual cycle: Secondary | ICD-10-CM | POA: Insufficient documentation

## 2012-05-31 DIAGNOSIS — O0990 Supervision of high risk pregnancy, unspecified, unspecified trimester: Secondary | ICD-10-CM

## 2012-05-31 DIAGNOSIS — B3731 Acute candidiasis of vulva and vagina: Secondary | ICD-10-CM | POA: Insufficient documentation

## 2012-05-31 DIAGNOSIS — O30009 Twin pregnancy, unspecified number of placenta and unspecified number of amniotic sacs, unspecified trimester: Secondary | ICD-10-CM

## 2012-05-31 DIAGNOSIS — B373 Candidiasis of vulva and vagina: Secondary | ICD-10-CM

## 2012-05-31 DIAGNOSIS — E079 Disorder of thyroid, unspecified: Secondary | ICD-10-CM

## 2012-05-31 DIAGNOSIS — E0590 Thyrotoxicosis, unspecified without thyrotoxic crisis or storm: Secondary | ICD-10-CM

## 2012-05-31 LAB — WET PREP, GENITAL
Clue Cells Wet Prep HPF POC: NONE SEEN
Trich, Wet Prep: NONE SEEN

## 2012-05-31 MED ORDER — FLUCONAZOLE 150 MG PO TABS: 150.0000 mg | ORAL_TABLET | Freq: Once | ORAL | Status: AC

## 2012-05-31 NOTE — MAU Note (Signed)
Pt presents to MAU for watery discharge that started yesterday.  Pt says yesterday the discharge was running down her leg, but today there is just an increase in the discharge with damp pantyliners.

## 2012-05-31 NOTE — MAU Note (Signed)
Patient states she has had a lot of vaginal discharge for a while, has gotten a lot heavier and watery for the past 2 days. Patient is having twins. Denies any pain or bleeding.

## 2012-05-31 NOTE — MAU Provider Note (Signed)
Attestation of Attending Supervision of Advanced Practitioner (CNM/NP): Evaluation and management procedures were performed by the Advanced Practitioner under my supervision and collaboration.  I have reviewed the Advanced Practitioner's note and chart, and I agree with the management and plan.  Jaynie Collins, M.D. 05/31/2012 4:47 PM

## 2012-05-31 NOTE — MAU Provider Note (Signed)
Chief Complaint:  Vaginal Discharge   None     HPI  [name] is a 26 y.o. G1P0 at [redacted]w[redacted]d presenting with leakage of clear fluid since yesterday.  It has been enough to wet her underwear x2 and she is wearing a pantyliner but has not required a pad.  She reports good fetal movement, denies uterine cramping/contractions, vaginal bleeding, vaginal itching/burning, urinary symptoms, h/a, dizziness, n/v, or fever/chills.    Pregnancy Course: uncomplicated  Past Medical History: Past Medical History  Diagnosis Date  . HSV-1 infection   . Abnormal Pap smear   . Anxiety   . Headache   . Hyperthyroidism     Past Surgical History: Past Surgical History  Procedure Date  . Wisdom tooth extraction     x4    Family History: Family History  Problem Relation Age of Onset  . Depression Mother     chronic  . Hearing loss Mother   . Hyperlipidemia Mother   . Vision loss Mother     beachets disease  . COPD Father   . Diabetes Father   . Cancer Maternal Grandmother 31    breast cancer  . Parkinsonism Maternal Grandmother   . Heart disease Maternal Grandmother   . Alzheimer's disease Paternal Grandmother   . Heart disease Paternal Grandfather     Died age 86  . Nephrolithiasis Paternal Grandfather   . Cancer Other 60    breast    Social History: History  Substance Use Topics  . Smoking status: Former Smoker -- 1.0 packs/day for 10 years    Types: Cigarettes    Quit date: 01/31/2012  . Smokeless tobacco: Not on file  . Alcohol Use: No    Allergies: No Known Allergies  Meds:  Prescriptions prior to admission  Medication Sig Dispense Refill  . almotriptan (AXERT) 6.25 MG tablet Take 6.25 mg by mouth as needed. may repeat in 2 hours if needed       . ALPRAZolam (XANAX) 0.5 MG tablet Take 0.5 mg by mouth at bedtime as needed.        . cetirizine (ZYRTEC) 10 MG tablet Take 10 mg by mouth daily.        . clobetasol (TEMOVATE) 0.05 % ointment Apply topically 2 (two)  times daily.        . DiphenhydrAMINE HCl (BENADRYL ALLERGY PO) Take by mouth.      . drospirenone-ethinyl estradiol (YASMIN) 3-0.03 MG per tablet Take 1 tablet by mouth daily.        . methimazole (TAPAZOLE) 5 MG tablet Take 1 tablet (5 mg total) by mouth daily. Start on 03/26/12 ([redacted] weeks GA)  30 tablet  3  . methylphenidate (CONCERTA) 36 MG CR tablet Take 36 mg by mouth every morning.        . mometasone (NASONEX) 50 MCG/ACT nasal spray Place 2 sprays into the nose daily.        . ondansetron (ZOFRAN) 4 MG tablet Take 4 mg by mouth every 8 (eight) hours as needed.      . Prenatal Vit-Fe Fumarate-FA (PRENAPLUS) 27-1 MG TABS Take by mouth.      . promethazine (PHENERGAN) 12.5 MG tablet Take 1 tablet (12.5 mg total) by mouth every 6 (six) hours as needed for nausea.  30 tablet  1      Physical Exam  Blood pressure 124/81, pulse 103, temperature 98.7 F (37.1 C), temperature source Oral, resp. rate 16, height 5' 7.5" (1.715 m), weight 66.497  kg (146 lb 9.6 oz), last menstrual period 01/02/2012, SpO2 100.00%. GENERAL: Well-developed, well-nourished female in no acute distress.  HEENT: normocephalic, good dentition HEART: normal rate RESP: normal effort ABDOMEN: Soft, nontender, gravid appropriate for gestational age EXTREMITIES: Nontender, no edema NEURO: alert and oriented  Pelvic exam: Cervix pink, with some erythema, visually closed, scant white creamy discharge, no pooling of fluid, no fluid from cervical os when pt coughs/bears down, vaginal walls and external genitalia normal Cervix 0/long/high, firm, posterior   FHT Baby A 144,  Baby B 139   Labs: Results for orders placed during the hospital encounter of 05/31/12 (from the past 24 hour(s))  WET PREP, GENITAL     Status: Abnormal   Collection Time   05/31/12  2:10 PM      Component Value Range   Yeast Wet Prep HPF POC FEW (*) NONE SEEN   Trich, Wet Prep NONE SEEN  NONE SEEN   Clue Cells Wet Prep HPF POC NONE SEEN  NONE SEEN    WBC, Wet Prep HPF POC NONE SEEN  NONE SEEN     Assessment: Candida vaginitis   Plan: D/C home with PTL precautions Diflucan 150 mg x2 doses 48 hours apart F/U in [facility] Return to MAU as needed  LEFTWICH-KIRBY, [name] 7/25/20132:18 PM

## 2012-06-19 ENCOUNTER — Ambulatory Visit (HOSPITAL_COMMUNITY)
Admission: RE | Admit: 2012-06-19 | Discharge: 2012-06-19 | Disposition: A | Discharge status: Home / Self Care | Payer: PRIVATE HEALTH INSURANCE | Source: Ambulatory Visit | Attending: Obstetrics & Gynecology | Admitting: Obstetrics & Gynecology

## 2012-06-19 ENCOUNTER — Encounter (HOSPITAL_COMMUNITY): Payer: Self-pay

## 2012-06-19 ENCOUNTER — Encounter: Payer: PRIVATE HEALTH INSURANCE | Admitting: Obstetrics & Gynecology

## 2012-06-19 ENCOUNTER — Other Ambulatory Visit: Payer: Self-pay | Admitting: Obstetrics & Gynecology

## 2012-06-19 DIAGNOSIS — E079 Disorder of thyroid, unspecified: Secondary | ICD-10-CM | POA: Insufficient documentation

## 2012-06-19 DIAGNOSIS — O30009 Twin pregnancy, unspecified number of placenta and unspecified number of amniotic sacs, unspecified trimester: Secondary | ICD-10-CM

## 2012-06-19 DIAGNOSIS — Z1389 Encounter for screening for other disorder: Secondary | ICD-10-CM | POA: Insufficient documentation

## 2012-06-19 DIAGNOSIS — O99280 Endocrine, nutritional and metabolic diseases complicating pregnancy, unspecified trimester: Secondary | ICD-10-CM

## 2012-06-19 DIAGNOSIS — E0590 Thyrotoxicosis, unspecified without thyrotoxic crisis or storm: Secondary | ICD-10-CM

## 2012-06-19 DIAGNOSIS — Z363 Encounter for antenatal screening for malformations: Secondary | ICD-10-CM | POA: Insufficient documentation

## 2012-06-19 DIAGNOSIS — O0990 Supervision of high risk pregnancy, unspecified, unspecified trimester: Secondary | ICD-10-CM

## 2012-06-19 DIAGNOSIS — O358XX0 Maternal care for other (suspected) fetal abnormality and damage, not applicable or unspecified: Secondary | ICD-10-CM | POA: Insufficient documentation

## 2012-06-20 ENCOUNTER — Encounter: Payer: Self-pay | Admitting: Obstetrics & Gynecology

## 2012-06-20 ENCOUNTER — Ambulatory Visit (INDEPENDENT_AMBULATORY_CARE_PROVIDER_SITE_OTHER): Payer: PRIVATE HEALTH INSURANCE | Admitting: Obstetrics & Gynecology

## 2012-06-20 VITALS — BP 95/64 | Wt 151.0 lb

## 2012-06-20 DIAGNOSIS — O0990 Supervision of high risk pregnancy, unspecified, unspecified trimester: Secondary | ICD-10-CM

## 2012-06-20 DIAGNOSIS — O30009 Twin pregnancy, unspecified number of placenta and unspecified number of amniotic sacs, unspecified trimester: Secondary | ICD-10-CM

## 2012-06-20 MED ORDER — ONDANSETRON HCL 4 MG PO TABS: 4.0000 mg | ORAL_TABLET | Freq: Three times a day (TID) | ORAL | Status: DC | PRN

## 2012-06-20 NOTE — Progress Notes (Signed)
Occasional cramping

## 2012-06-20 NOTE — Progress Notes (Signed)
Routine visit. Good FM x 2. U/S 06/19/12 at MFM normal with concordant growth (42 and 35%).  1 hour glucola, labs, TSH, rhophylac, and TDAP at [facility]. She is requesting a refill of zofran.

## 2012-06-21 ENCOUNTER — Encounter: Payer: Self-pay | Admitting: Obstetrics & Gynecology

## 2012-06-21 DIAGNOSIS — O358XX0 Maternal care for other (suspected) fetal abnormality and damage, not applicable or unspecified: Secondary | ICD-10-CM | POA: Insufficient documentation

## 2012-07-04 ENCOUNTER — Ambulatory Visit (INDEPENDENT_AMBULATORY_CARE_PROVIDER_SITE_OTHER): Payer: PRIVATE HEALTH INSURANCE | Admitting: Obstetrics & Gynecology

## 2012-07-04 VITALS — BP 97/61 | Wt 152.0 lb

## 2012-07-04 DIAGNOSIS — Z3480 Encounter for supervision of other normal pregnancy, unspecified trimester: Secondary | ICD-10-CM

## 2012-07-04 DIAGNOSIS — O30009 Twin pregnancy, unspecified number of placenta and unspecified number of amniotic sacs, unspecified trimester: Secondary | ICD-10-CM

## 2012-07-04 DIAGNOSIS — O0990 Supervision of high risk pregnancy, unspecified, unspecified trimester: Secondary | ICD-10-CM

## 2012-07-04 DIAGNOSIS — Z23 Encounter for immunization: Secondary | ICD-10-CM

## 2012-07-04 DIAGNOSIS — O361190 Maternal care for Anti-A sensitization, unspecified trimester, not applicable or unspecified: Secondary | ICD-10-CM

## 2012-07-04 LAB — CBC
HCT: 33.5 % — ABNORMAL LOW (ref 36.0–46.0)
Hemoglobin: 11.3 g/dL — ABNORMAL LOW (ref 12.0–15.0)
MCH: 31.9 pg (ref 26.0–34.0)
MCHC: 33.7 g/dL (ref 30.0–36.0)
MCV: 94.6 fL (ref 78.0–100.0)

## 2012-07-04 MED ORDER — RHO D IMMUNE GLOBULIN 1500 UNIT/2ML IJ SOLN: 300.0000 ug | Freq: Once | INTRAMUSCULAR | Status: DC

## 2012-07-04 NOTE — Patient Instructions (Signed)

## 2012-07-04 NOTE — Addendum Note (Signed)
Addended by: Vinnie Langton C on: 07/04/2012 12:27 PM   Modules accepted: Orders

## 2012-07-04 NOTE — Progress Notes (Signed)
No c/o PTL symptoms. Good fetal movement.

## 2012-07-05 LAB — HIV ANTIBODY (ROUTINE TESTING W REFLEX): HIV: NONREACTIVE

## 2012-07-05 LAB — TSH: TSH: 3.205 u[IU]/mL (ref 0.350–4.500)

## 2012-07-19 ENCOUNTER — Other Ambulatory Visit (HOSPITAL_COMMUNITY): Payer: Self-pay | Admitting: Maternal and Fetal Medicine

## 2012-07-19 DIAGNOSIS — IMO0001 Reserved for inherently not codable concepts without codable children: Secondary | ICD-10-CM

## 2012-07-23 ENCOUNTER — Ambulatory Visit (HOSPITAL_COMMUNITY)
Admission: RE | Admit: 2012-07-23 | Discharge: 2012-07-23 | Disposition: A | Discharge status: Home / Self Care | Payer: PRIVATE HEALTH INSURANCE | Source: Ambulatory Visit | Attending: Obstetrics & Gynecology | Admitting: Obstetrics & Gynecology

## 2012-07-23 ENCOUNTER — Other Ambulatory Visit (HOSPITAL_COMMUNITY): Payer: Self-pay | Admitting: Maternal and Fetal Medicine

## 2012-07-23 VITALS — BP 105/70 | HR 115 | Wt 155.0 lb

## 2012-07-23 DIAGNOSIS — O0990 Supervision of high risk pregnancy, unspecified, unspecified trimester: Secondary | ICD-10-CM

## 2012-07-23 DIAGNOSIS — Z363 Encounter for antenatal screening for malformations: Secondary | ICD-10-CM | POA: Insufficient documentation

## 2012-07-23 DIAGNOSIS — E079 Disorder of thyroid, unspecified: Secondary | ICD-10-CM | POA: Insufficient documentation

## 2012-07-23 DIAGNOSIS — Z1389 Encounter for screening for other disorder: Secondary | ICD-10-CM | POA: Insufficient documentation

## 2012-07-23 DIAGNOSIS — IMO0002 Reserved for concepts with insufficient information to code with codable children: Secondary | ICD-10-CM

## 2012-07-23 DIAGNOSIS — IMO0001 Reserved for inherently not codable concepts without codable children: Secondary | ICD-10-CM

## 2012-07-23 DIAGNOSIS — E0590 Thyrotoxicosis, unspecified without thyrotoxic crisis or storm: Secondary | ICD-10-CM

## 2012-07-23 DIAGNOSIS — O358XX0 Maternal care for other (suspected) fetal abnormality and damage, not applicable or unspecified: Secondary | ICD-10-CM | POA: Insufficient documentation

## 2012-07-23 DIAGNOSIS — O99280 Endocrine, nutritional and metabolic diseases complicating pregnancy, unspecified trimester: Secondary | ICD-10-CM | POA: Insufficient documentation

## 2012-07-23 DIAGNOSIS — O30009 Twin pregnancy, unspecified number of placenta and unspecified number of amniotic sacs, unspecified trimester: Secondary | ICD-10-CM

## 2012-07-23 DIAGNOSIS — O365990 Maternal care for other known or suspected poor fetal growth, unspecified trimester, not applicable or unspecified: Secondary | ICD-10-CM | POA: Insufficient documentation

## 2012-07-23 DIAGNOSIS — O09299 Supervision of pregnancy with other poor reproductive or obstetric history, unspecified trimester: Secondary | ICD-10-CM

## 2012-07-23 DIAGNOSIS — O35EXX0 Maternal care for other (suspected) fetal abnormality and damage, fetal genitourinary anomalies, not applicable or unspecified: Secondary | ICD-10-CM

## 2012-07-23 NOTE — Progress Notes (Signed)
Kimberly [name]  was seen today for an ultrasound appointment.  See full report in [facility].  Alpha Gula, MD  DC/DA twin gestation with best dates of 29 0/7 weeks 25% growth discordance noted with Twin B being the smaller twin Normal amniotic fluid volume x 2 noted  A: cephalic presentation, posterior placenta     Fetal growth is appropriate (37th %tile)     Normal interval anatomy     Umbilical artery Doppler studies within normal limits for gestational age  B: transverse lie with fetal head on left, anterior placenta     Normal interval anatomy     IUGR noted (EFW at the 10th %tile), AC < 3rd % and remainder of biometry at or less that the 5th %tile     Elevated umbilical artery S/D ratios (> 97th %tile).  Intermittently absent diastolic flow noted, but no evidence of reversed flow seen  Recommend course of betamethasone - to be seen in [facility] and will be given first dose then 2x weekly BPPs and Doppler studies - will follow up here Thursday Follow up growth scan in 2-3 weeks Would recommend inpatient observation if persistent absent diastolic flow noted on Umbilical artery Dopplers

## 2012-07-24 ENCOUNTER — Encounter: Payer: Self-pay | Admitting: Obstetrics & Gynecology

## 2012-07-24 ENCOUNTER — Ambulatory Visit (INDEPENDENT_AMBULATORY_CARE_PROVIDER_SITE_OTHER): Payer: PRIVATE HEALTH INSURANCE | Admitting: Obstetrics & Gynecology

## 2012-07-24 VITALS — BP 114/63 | Wt 155.0 lb

## 2012-07-24 DIAGNOSIS — O0990 Supervision of high risk pregnancy, unspecified, unspecified trimester: Secondary | ICD-10-CM

## 2012-07-24 DIAGNOSIS — E079 Disorder of thyroid, unspecified: Secondary | ICD-10-CM

## 2012-07-24 DIAGNOSIS — O358XX0 Maternal care for other (suspected) fetal abnormality and damage, not applicable or unspecified: Secondary | ICD-10-CM

## 2012-07-24 DIAGNOSIS — O99280 Endocrine, nutritional and metabolic diseases complicating pregnancy, unspecified trimester: Secondary | ICD-10-CM

## 2012-07-24 DIAGNOSIS — O30009 Twin pregnancy, unspecified number of placenta and unspecified number of amniotic sacs, unspecified trimester: Secondary | ICD-10-CM

## 2012-07-24 DIAGNOSIS — O365990 Maternal care for other known or suspected poor fetal growth, unspecified trimester, not applicable or unspecified: Secondary | ICD-10-CM

## 2012-07-24 DIAGNOSIS — E0590 Thyrotoxicosis, unspecified without thyrotoxic crisis or storm: Secondary | ICD-10-CM

## 2012-07-24 DIAGNOSIS — Z1389 Encounter for screening for other disorder: Secondary | ICD-10-CM

## 2012-07-24 MED ORDER — BETAMETHASONE SOD PHOS & ACET 6 (3-3) MG/ML IJ SUSP: 12.5000 mg | Freq: Every day | INTRAMUSCULAR | Status: DC

## 2012-07-24 MED ORDER — BETAMETHASONE SOD PHOS & ACET 6 (3-3) MG/ML IJ SUSP
12.0000 mg | Freq: Every day | INTRAMUSCULAR | Status: AC
  Administered 2012-07-24: 12 mg via INTRAMUSCULAR

## 2012-07-24 NOTE — Progress Notes (Signed)
Having increased back pain and sometimes feels like she has to urinate very bad and can't.  Urine dip is negative for leukocytes and blood, patient  Is reassured.

## 2012-07-24 NOTE — Progress Notes (Signed)
Please see Dr. Fredda Hammed note on 07/23/12 for more detatils. New diagnosis of IUGR of Twin B with elevated umbilical artery dopplers, intermittent absent EDF. Undergoing twice weekly BPP and dopplers at MFM, next study scheduled on Thursday.  Betamethasone injection given today, to be repeated tomorrow.  Fetal movement and preterm labor precautions reviewed. Appropriate support given to patient.

## 2012-07-24 NOTE — Patient Instructions (Signed)
Return to clinic for any obstetric concerns or go to MAU for evaluation  

## 2012-07-24 NOTE — Addendum Note (Signed)
Addended by: Barbara Cower on: 07/24/2012 01:49 PM   Modules accepted: Orders

## 2012-07-25 ENCOUNTER — Ambulatory Visit (INDEPENDENT_AMBULATORY_CARE_PROVIDER_SITE_OTHER): Payer: PRIVATE HEALTH INSURANCE | Admitting: *Deleted

## 2012-07-25 DIAGNOSIS — O365990 Maternal care for other known or suspected poor fetal growth, unspecified trimester, not applicable or unspecified: Secondary | ICD-10-CM

## 2012-07-25 MED ORDER — BETAMETHASONE SOD PHOS & ACET 6 (3-3) MG/ML IJ SUSP
12.5000 mg | Freq: Once | INTRAMUSCULAR | Status: AC
  Administered 2012-07-25: 12.5 mg via INTRAMUSCULAR

## 2012-07-25 NOTE — Progress Notes (Signed)
Patient is here for second dose of betamethasone.

## 2012-07-26 ENCOUNTER — Ambulatory Visit (HOSPITAL_COMMUNITY)
Admission: RE | Admit: 2012-07-26 | Discharge: 2012-07-26 | Disposition: A | Discharge status: Home / Self Care | Payer: PRIVATE HEALTH INSURANCE | Source: Ambulatory Visit | Attending: Obstetrics & Gynecology | Admitting: Obstetrics & Gynecology

## 2012-07-26 ENCOUNTER — Other Ambulatory Visit (HOSPITAL_COMMUNITY): Payer: Self-pay | Admitting: Maternal and Fetal Medicine

## 2012-07-26 VITALS — BP 106/70 | HR 104 | Wt 155.8 lb

## 2012-07-26 DIAGNOSIS — O358XX0 Maternal care for other (suspected) fetal abnormality and damage, not applicable or unspecified: Secondary | ICD-10-CM

## 2012-07-26 DIAGNOSIS — IMO0001 Reserved for inherently not codable concepts without codable children: Secondary | ICD-10-CM

## 2012-07-26 DIAGNOSIS — O30049 Twin pregnancy, dichorionic/diamniotic, unspecified trimester: Secondary | ICD-10-CM | POA: Insufficient documentation

## 2012-07-26 DIAGNOSIS — O0990 Supervision of high risk pregnancy, unspecified, unspecified trimester: Secondary | ICD-10-CM

## 2012-07-26 DIAGNOSIS — O99280 Endocrine, nutritional and metabolic diseases complicating pregnancy, unspecified trimester: Secondary | ICD-10-CM

## 2012-07-26 DIAGNOSIS — O365990 Maternal care for other known or suspected poor fetal growth, unspecified trimester, not applicable or unspecified: Secondary | ICD-10-CM

## 2012-07-26 DIAGNOSIS — O30009 Twin pregnancy, unspecified number of placenta and unspecified number of amniotic sacs, unspecified trimester: Secondary | ICD-10-CM

## 2012-07-26 DIAGNOSIS — E079 Disorder of thyroid, unspecified: Secondary | ICD-10-CM | POA: Insufficient documentation

## 2012-07-26 DIAGNOSIS — E0590 Thyrotoxicosis, unspecified without thyrotoxic crisis or storm: Secondary | ICD-10-CM

## 2012-07-26 DIAGNOSIS — IMO0002 Reserved for concepts with insufficient information to code with codable children: Secondary | ICD-10-CM

## 2012-07-27 ENCOUNTER — Other Ambulatory Visit (HOSPITAL_COMMUNITY): Payer: Self-pay | Admitting: Maternal and Fetal Medicine

## 2012-07-27 DIAGNOSIS — IMO0001 Reserved for inherently not codable concepts without codable children: Secondary | ICD-10-CM

## 2012-07-30 ENCOUNTER — Encounter (HOSPITAL_COMMUNITY): Payer: Self-pay | Admitting: *Deleted

## 2012-07-30 ENCOUNTER — Ambulatory Visit (HOSPITAL_COMMUNITY)
Admission: RE | Admit: 2012-07-30 | Discharge: 2012-07-30 | Disposition: A | Discharge status: Home / Self Care | Payer: PRIVATE HEALTH INSURANCE | Source: Ambulatory Visit | Attending: Family Medicine | Admitting: Family Medicine

## 2012-07-30 ENCOUNTER — Observation Stay (HOSPITAL_COMMUNITY)
Admission: AD | Admit: 2012-07-30 | Discharge: 2012-07-30 | Disposition: A | Discharge status: Home / Self Care | Payer: PRIVATE HEALTH INSURANCE | Source: Ambulatory Visit | Attending: Obstetrics & Gynecology | Admitting: Obstetrics & Gynecology

## 2012-07-30 ENCOUNTER — Ambulatory Visit (HOSPITAL_COMMUNITY)
Admission: RE | Admit: 2012-07-30 | Discharge: 2012-07-30 | Disposition: A | Discharge status: Home / Self Care | Payer: PRIVATE HEALTH INSURANCE | Source: Ambulatory Visit | Attending: Obstetrics & Gynecology | Admitting: Obstetrics & Gynecology

## 2012-07-30 ENCOUNTER — Observation Stay (HOSPITAL_COMMUNITY): Payer: PRIVATE HEALTH INSURANCE

## 2012-07-30 VITALS — BP 110/67 | HR 110 | Wt 157.8 lb

## 2012-07-30 DIAGNOSIS — O99891 Other specified diseases and conditions complicating pregnancy: Secondary | ICD-10-CM

## 2012-07-30 DIAGNOSIS — O30009 Twin pregnancy, unspecified number of placenta and unspecified number of amniotic sacs, unspecified trimester: Secondary | ICD-10-CM

## 2012-07-30 DIAGNOSIS — O365990 Maternal care for other known or suspected poor fetal growth, unspecified trimester, not applicable or unspecified: Secondary | ICD-10-CM

## 2012-07-30 DIAGNOSIS — O358XX0 Maternal care for other (suspected) fetal abnormality and damage, not applicable or unspecified: Secondary | ICD-10-CM

## 2012-07-30 DIAGNOSIS — E0590 Thyrotoxicosis, unspecified without thyrotoxic crisis or storm: Secondary | ICD-10-CM | POA: Insufficient documentation

## 2012-07-30 DIAGNOSIS — O0990 Supervision of high risk pregnancy, unspecified, unspecified trimester: Secondary | ICD-10-CM

## 2012-07-30 DIAGNOSIS — O30049 Twin pregnancy, dichorionic/diamniotic, unspecified trimester: Secondary | ICD-10-CM

## 2012-07-30 DIAGNOSIS — IMO0001 Reserved for inherently not codable concepts without codable children: Secondary | ICD-10-CM

## 2012-07-30 DIAGNOSIS — E079 Disorder of thyroid, unspecified: Secondary | ICD-10-CM | POA: Insufficient documentation

## 2012-07-30 DIAGNOSIS — O99280 Endocrine, nutritional and metabolic diseases complicating pregnancy, unspecified trimester: Secondary | ICD-10-CM | POA: Insufficient documentation

## 2012-07-30 DIAGNOSIS — O9989 Other specified diseases and conditions complicating pregnancy, childbirth and the puerperium: Secondary | ICD-10-CM

## 2012-07-30 MED ORDER — DOCUSATE SODIUM 100 MG PO CAPS: 100.0000 mg | ORAL_CAPSULE | Freq: Every day | ORAL | Status: DC

## 2012-07-30 MED ORDER — CALCIUM CARBONATE ANTACID 500 MG PO CHEW: 2.0000 | CHEWABLE_TABLET | ORAL | Status: DC | PRN

## 2012-07-30 MED ORDER — PRENATAL MULTIVITAMIN CH: 1.0000 | ORAL_TABLET | Freq: Every day | ORAL | Status: DC

## 2012-07-30 MED ORDER — ZOLPIDEM TARTRATE 5 MG PO TABS: 5.0000 mg | ORAL_TABLET | Freq: Every evening | ORAL | Status: DC | PRN

## 2012-07-30 MED ORDER — ACETAMINOPHEN 325 MG PO TABS: 650.0000 mg | ORAL_TABLET | ORAL | Status: DC | PRN

## 2012-07-30 NOTE — ED Notes (Signed)
Pt taken to MAU for admission per Dr. Alen Bleacher as no bed is available on ante at this time.  Report given to charge RN.

## 2012-07-30 NOTE — Discharge Summary (Signed)
  ADMIT DATE:  07/30/12  DISCHARGE DATE:  07/30/12  ATTENDING:  Elsie Lincoln, MD  DIAGNOSIS: Di/Di Twins--increased dopplers Baby B with 6/8 BPP  [name] is a 26 y.o. G1P0000 at [redacted]w[redacted]d with twin intrauterine pregnancy who is being followed by Maternal Fetal Medicine with serial ultrasounds. Her BPP today showed Twin B with umbilical artery Doppler S/D is >2SD above the mean for the assigned gestational age and persistent diastolic flow and no reversal of diastolic flow. BPP was 6/8 (-2 no breathing).  The patient was admitted for observation, fetal monitoring and repeat BPP after 6 hours. Fetal monitoring was reassuring with reactive NST for both twins. Repeat BPP was 8/8 for both twins. Patient denies contractions, bleeding or loss of fluid.  DISCHARGE INSTRUCTIONS:  Discharge Home.  Follow-Up ultrasound on 08/03/12 with MFM.  Medications Prior to Admission  Medication Sig Dispense Refill  . calcium carbonate (TUMS - DOSED IN MG ELEMENTAL CALCIUM) 500 MG chewable tablet Chew 1 tablet by mouth daily as needed. For heartburn      . cetirizine-pseudoephedrine (ZYRTEC-D) 5-120 MG per tablet Take 1 tablet by mouth daily.      . diphenhydrAMINE (BENADRYL) 25 MG tablet Take 25 mg by mouth at bedtime as needed. For allergies      . folic acid (FOLVITE) 400 MCG tablet Take 400 mcg by mouth daily.      . methimazole (TAPAZOLE) 5 MG tablet Take 5 mg by mouth daily.      . ondansetron (ZOFRAN) 4 MG tablet Take 4 mg by mouth every 8 (eight) hours as needed. For nausea      . Prenatal Vit-Fe Fumarate-FA (PRENATAL MULTIVITAMIN) TABS Take 1 tablet by mouth daily.      Marland Kitchen DISCONTD: betamethasone acetate-betamethasone sodium phosphate (CELESTONE) 6 (3-3) MG/ML injection Inject 12 mg into the muscle See admin instructions. Pt has received 2 injections in the office last week       Napoleon Form, MD 07/30/2012 9:17 PM

## 2012-07-30 NOTE — H&P (Signed)
Kimberly [name] is a 26 y.o. female @ [redacted]w[redacted]d with di/di twins presenting from [facility] for increased dopplers and BPP of 6/8 for Baby B.    Maternal Medical History:  Reason for admission: Reason for Admission:   nauseaDi/Di Twins--increased dopplers Baby B with 6/8 BPP  Contractions: None  Fetal activity: Perceived fetal activity is normal.   Last perceived fetal movement was within the past hour.    Prenatal complications: Hyperthyroidism with tachycardia, currently managed with medications  Prenatal Complications - Diabetes: none.    OB History    Grav Para Term Preterm Abortions TAB SAB Ect Mult Living   1              Past Medical History  Diagnosis Date  . HSV-1 infection   . Abnormal Pap smear   . Anxiety   . Headache   . Hyperthyroidism    Past Surgical History  Procedure Date  . Wisdom tooth extraction     x4   Family History: family history includes Alzheimer's disease in her paternal grandmother; COPD in her father; Cancer (age of onset:49) in her maternal grandmother; Cancer (age of onset:60) in her other; Depression in her mother; Diabetes in her father; Hearing loss in her mother; Heart disease in her maternal grandmother and paternal grandfather; Hyperlipidemia in her mother; Nephrolithiasis in her paternal grandfather; Parkinsonism in her maternal grandmother; and Vision loss in her mother. Social History:  reports that she quit smoking about 5 months ago. Her smoking use included Cigarettes. She has a 10 pack-year smoking history. She does not have any smokeless tobacco history on file. She reports that she does not drink alcohol or use illicit drugs.   Prenatal Transfer Tool  Maternal Diabetes: No Genetic Screening: Normal Maternal Ultrasounds/Referrals: Normal, except BPP 6/8 07/30/12  Fetal Ultrasounds or other Referrals:  None Maternal Substance Abuse:  No Significant Maternal Medications:  Meds include: Other: methimazole 5 mg daily Significant Maternal  Lab Results:  None Other Comments:  None  Review of Systems  Constitutional: Negative for fever, chills and malaise/fatigue.  Eyes: Negative for blurred vision.  Respiratory: Negative for cough and shortness of breath.   Cardiovascular: Negative for chest pain.  Gastrointestinal: Negative for heartburn, nausea, vomiting and abdominal pain.  Genitourinary: Negative for dysuria, urgency and frequency.  Musculoskeletal: Negative.   Neurological: Negative for dizziness and headaches.  Psychiatric/Behavioral: Negative for depression.      Last menstrual period 01/02/2012. Maternal Exam:  Uterine Assessment: Contraction duration is 60 seconds. Contraction frequency is rare.   Abdomen: Patient reports no abdominal tenderness.   Fetal Exam Fetal Monitor Review: Mode: ultrasound.   Baseline rate: Baby A 135 with accels, Baby B 135, no accels.  Variability: moderate (6-25 bpm).   Pattern: no decelerations.    Fetal State Assessment: Category I - tracings are normal.     Physical Exam  Nursing note and vitals reviewed. Constitutional: She is oriented to person, place, and time. She appears well-developed and well-nourished.  Neck: Normal range of motion.  Cardiovascular: Normal rate, regular rhythm and normal heart sounds.   Respiratory: Effort normal and breath sounds normal.  GI: Soft.  Musculoskeletal: Normal range of motion.  Neurological: She is alert and oriented to person, place, and time.  Skin: Skin is warm and dry.  Psychiatric: She has a normal mood and affect. Her behavior is normal. Judgment and thought content normal.    Prenatal labs: ABO, Rh: O/NEG/-- (04/15 1134) Antibody: NEG (04/15 1134) Rubella:  189.4 (04/15 1134) RPR: NON REAC (08/28 1324)  HBsAg: NEGATIVE (04/15 1134)  HIV: NON REACTIVE (08/28 1324)  GBS:   unknown  Assessment/Plan: [redacted]w[redacted]d Di/Di twins sent for EFM/repeat BPP from MFM for increased dopplers on Baby B BPP 6/8 for Baby B  Admit for  observation Regular diet Continuous EFM Repeat BPP in 4-6 hours   LEFTWICH-KIRBY, LISA 07/30/2012, 3:11 PM

## 2012-07-30 NOTE — Progress Notes (Signed)
[name] had an ultrasound appointment today.  Please see AS-OB/GYN report for details.  Impression Active dichorionic diamniotic twins.  No apparent dysmorphic features are identified in either fetus on limited anatomic re-examination.  The amniotic fluid volumes are concordant.   Twin A:  The umbilical artery Doppler S/D is normal for the assigned gestational age.  BPP is 6/8 (-2 no breathing) Twin B:  The umbilical artery Doppler S/D is >2SD above the mean for the assigned gestational age.  There is persistent diastolic flow and no reversal of diastolic flow.  BPP is 6/8 (-2 no breathing)  Recommendations 1. The patient was sent to L&D for prolonged monitoring of 4-6 hours with repeat BPP after 4-6 hours of reassuring monitoring.  If 8/10 or better in both fetuses, then I recommend continued antenatal surveillance by twice weekly BPP. 2. Dr. Penne Lash of labor and delivery was given message of the patient's needed evaluation.  Rogelia Boga, MD, MS, FACOG Assistant Professor Section of Maternal-Fetal Medicine [facility]

## 2012-07-31 ENCOUNTER — Other Ambulatory Visit (HOSPITAL_COMMUNITY): Payer: Self-pay | Admitting: Obstetrics and Gynecology

## 2012-07-31 DIAGNOSIS — IMO0001 Reserved for inherently not codable concepts without codable children: Secondary | ICD-10-CM

## 2012-08-01 NOTE — H&P (Signed)
Medical Screening exam and patient care preformed by advanced practice provider.  Agree with the above management.  

## 2012-08-02 ENCOUNTER — Ambulatory Visit (HOSPITAL_COMMUNITY)
Admission: RE | Admit: 2012-08-02 | Discharge: 2012-08-02 | Disposition: A | Discharge status: Home / Self Care | Payer: PRIVATE HEALTH INSURANCE | Source: Ambulatory Visit | Attending: Obstetrics & Gynecology | Admitting: Obstetrics & Gynecology

## 2012-08-02 VITALS — BP 104/62 | HR 110 | Wt 155.0 lb

## 2012-08-02 DIAGNOSIS — O358XX0 Maternal care for other (suspected) fetal abnormality and damage, not applicable or unspecified: Secondary | ICD-10-CM

## 2012-08-02 DIAGNOSIS — E0590 Thyrotoxicosis, unspecified without thyrotoxic crisis or storm: Secondary | ICD-10-CM

## 2012-08-02 DIAGNOSIS — O0990 Supervision of high risk pregnancy, unspecified, unspecified trimester: Secondary | ICD-10-CM

## 2012-08-02 DIAGNOSIS — O365990 Maternal care for other known or suspected poor fetal growth, unspecified trimester, not applicable or unspecified: Secondary | ICD-10-CM

## 2012-08-02 DIAGNOSIS — IMO0001 Reserved for inherently not codable concepts without codable children: Secondary | ICD-10-CM

## 2012-08-02 DIAGNOSIS — O99280 Endocrine, nutritional and metabolic diseases complicating pregnancy, unspecified trimester: Secondary | ICD-10-CM

## 2012-08-02 DIAGNOSIS — O30009 Twin pregnancy, unspecified number of placenta and unspecified number of amniotic sacs, unspecified trimester: Secondary | ICD-10-CM

## 2012-08-02 DIAGNOSIS — E079 Disorder of thyroid, unspecified: Secondary | ICD-10-CM | POA: Insufficient documentation

## 2012-08-02 NOTE — Progress Notes (Signed)
Ms. Ulysees Barns had an ultrasound appointment today.  Please see AS-OB/GYN report for details.  Impression Active dichorionic diamniotic twins at 26 w 3d.  No apparent dysmorphic features are identified in either fetus on limited anatomic re-examination.  The amniotic fluid volumes are concordant.   Twin A:  The umbilical artery Doppler S/D is normal for the assigned gestational age.  BPP is 8/8 Twin B:  The umbilical artery Doppler S/D is >2SD above the mean for the assigned gestational age.  There is persistent diastolic flow and no reversal of diastolic flow.  BPP is 8/8.  Recommendations Continue antenatal surveillance and interval growth as previously scheduled.  Rogelia Boga, MD, MS, FACOG Assistant Professor Section of Maternal-Fetal Medicine [facility]

## 2012-08-03 ENCOUNTER — Other Ambulatory Visit: Payer: Self-pay | Admitting: Obstetrics & Gynecology

## 2012-08-03 DIAGNOSIS — O99280 Endocrine, nutritional and metabolic diseases complicating pregnancy, unspecified trimester: Secondary | ICD-10-CM

## 2012-08-03 DIAGNOSIS — E079 Disorder of thyroid, unspecified: Secondary | ICD-10-CM

## 2012-08-03 DIAGNOSIS — O365990 Maternal care for other known or suspected poor fetal growth, unspecified trimester, not applicable or unspecified: Secondary | ICD-10-CM

## 2012-08-03 DIAGNOSIS — O30009 Twin pregnancy, unspecified number of placenta and unspecified number of amniotic sacs, unspecified trimester: Secondary | ICD-10-CM

## 2012-08-06 ENCOUNTER — Encounter (HOSPITAL_COMMUNITY): Payer: Self-pay

## 2012-08-06 ENCOUNTER — Ambulatory Visit (HOSPITAL_COMMUNITY)
Admission: RE | Admit: 2012-08-06 | Discharge: 2012-08-06 | Disposition: A | Discharge status: Home / Self Care | Payer: PRIVATE HEALTH INSURANCE | Source: Ambulatory Visit | Attending: Obstetrics & Gynecology | Admitting: Obstetrics & Gynecology

## 2012-08-06 VITALS — BP 111/71 | HR 115 | Wt 154.8 lb

## 2012-08-06 DIAGNOSIS — O30009 Twin pregnancy, unspecified number of placenta and unspecified number of amniotic sacs, unspecified trimester: Secondary | ICD-10-CM

## 2012-08-06 DIAGNOSIS — E0590 Thyrotoxicosis, unspecified without thyrotoxic crisis or storm: Secondary | ICD-10-CM

## 2012-08-06 DIAGNOSIS — E079 Disorder of thyroid, unspecified: Secondary | ICD-10-CM | POA: Insufficient documentation

## 2012-08-06 DIAGNOSIS — O365990 Maternal care for other known or suspected poor fetal growth, unspecified trimester, not applicable or unspecified: Secondary | ICD-10-CM

## 2012-08-06 DIAGNOSIS — O0990 Supervision of high risk pregnancy, unspecified, unspecified trimester: Secondary | ICD-10-CM

## 2012-08-06 DIAGNOSIS — O358XX0 Maternal care for other (suspected) fetal abnormality and damage, not applicable or unspecified: Secondary | ICD-10-CM

## 2012-08-06 DIAGNOSIS — O99280 Endocrine, nutritional and metabolic diseases complicating pregnancy, unspecified trimester: Secondary | ICD-10-CM

## 2012-08-06 NOTE — Progress Notes (Signed)
Kimberly [name]  was seen today for an ultrasound appointment.  See full report in [facility]-OB/GYN.  Alpha Gula, MD  Dichorionic/diamniotic twin pregnancy at 13 0/7weeks Twin B: FGR Normal amniotic fluid volume x 2 Twin A: Umbilical artery Dopplers elevated for this gestation (94th %tile) but no evidence of absent or reversed diastolic flow BPP 6/8 for non sustained fetal breathing activity.  NST reactive  (BPP 8/10) Twin B: Umbilical artery Dopplers elevated for this gestational age (> 97th%tile), but no evidence of absent or reversed diatolic flow BPP 8/8, reactive NST (BPP 10/10)  Patient completed course of betamethasone on 17 Sept  Continue 2x weekly BPPs and Doppler studies Growth scan next week (7 October)

## 2012-08-09 ENCOUNTER — Ambulatory Visit (HOSPITAL_COMMUNITY)
Admission: RE | Admit: 2012-08-09 | Discharge: 2012-08-09 | Disposition: A | Discharge status: Home / Self Care | Payer: PRIVATE HEALTH INSURANCE | Source: Ambulatory Visit | Attending: Family Medicine | Admitting: Family Medicine

## 2012-08-09 VITALS — BP 99/66 | HR 100 | Wt 157.0 lb

## 2012-08-09 DIAGNOSIS — E079 Disorder of thyroid, unspecified: Secondary | ICD-10-CM

## 2012-08-09 DIAGNOSIS — O99280 Endocrine, nutritional and metabolic diseases complicating pregnancy, unspecified trimester: Secondary | ICD-10-CM

## 2012-08-09 DIAGNOSIS — O30009 Twin pregnancy, unspecified number of placenta and unspecified number of amniotic sacs, unspecified trimester: Secondary | ICD-10-CM | POA: Insufficient documentation

## 2012-08-09 DIAGNOSIS — O365990 Maternal care for other known or suspected poor fetal growth, unspecified trimester, not applicable or unspecified: Secondary | ICD-10-CM | POA: Insufficient documentation

## 2012-08-09 DIAGNOSIS — O358XX0 Maternal care for other (suspected) fetal abnormality and damage, not applicable or unspecified: Secondary | ICD-10-CM

## 2012-08-09 DIAGNOSIS — O0990 Supervision of high risk pregnancy, unspecified, unspecified trimester: Secondary | ICD-10-CM

## 2012-08-09 DIAGNOSIS — E0590 Thyrotoxicosis, unspecified without thyrotoxic crisis or storm: Secondary | ICD-10-CM

## 2012-08-09 NOTE — Progress Notes (Signed)
[facility] ultrasound  Indication: 26 yr old G1P0 at [redacted]w[redacted]d with dichorionic/diamniotic twin gestation with fetal growth restriction and abnormal Doppler studies in twin B for biophysical profile.  Findings: 1. Dichorionic/diamniotic twin gestation; the dividing membrane is seen. 2. Twin A with a posterior placenta; twin B with an anterior placenta; there is no evidence of previa. 3. Normal amniotic fluid volume for both fetuses. 4. Normal biophysical profiles of 8/8. 5. Normal umbilical artery Doppler studies in twin A; again seen in twin B is elevated systolic:diastolic ratio on umbilical artery Doppler studies. There is persistent forward flow.  Recommendations:  1. Fetal growth restriction of twin B: previously counseled. Elevated umbilical artery Doppler studies but persistent forward flow. Continue twice weekly BPPs and Doppler studies. Has received a course of betamethasone. For fetal growth on 08/13/12. 2. Previous finding of pyelectasis in twin A: not evaluated on today's exam; will be evalauted on the next exam. 3. Hyperthyroidism: well controlled. 4. Recommend continue twice weekly BPPs and Doppler studies and fetal growth every 2-3 weeks.  Eulis Foster, MD

## 2012-08-10 ENCOUNTER — Encounter: Payer: Self-pay | Admitting: Family Medicine

## 2012-08-10 ENCOUNTER — Ambulatory Visit (INDEPENDENT_AMBULATORY_CARE_PROVIDER_SITE_OTHER): Payer: PRIVATE HEALTH INSURANCE | Admitting: Family Medicine

## 2012-08-10 VITALS — BP 97/73 | Wt 154.0 lb

## 2012-08-10 DIAGNOSIS — Z23 Encounter for immunization: Secondary | ICD-10-CM

## 2012-08-10 DIAGNOSIS — Z3480 Encounter for supervision of other normal pregnancy, unspecified trimester: Secondary | ICD-10-CM

## 2012-08-10 DIAGNOSIS — E079 Disorder of thyroid, unspecified: Secondary | ICD-10-CM

## 2012-08-10 DIAGNOSIS — O99280 Endocrine, nutritional and metabolic diseases complicating pregnancy, unspecified trimester: Secondary | ICD-10-CM

## 2012-08-10 DIAGNOSIS — E0590 Thyrotoxicosis, unspecified without thyrotoxic crisis or storm: Secondary | ICD-10-CM

## 2012-08-10 LAB — TSH: TSH: 5.244 u[IU]/mL — ABNORMAL HIGH (ref 0.350–4.500)

## 2012-08-10 LAB — T4, FREE: Free T4: 0.78 ng/dL — ABNORMAL LOW (ref 0.80–1.80)

## 2012-08-10 MED ORDER — INFLUENZA VIRUS VACC SPLIT PF IM SUSP: 0.5000 mL | Freq: Once | INTRAMUSCULAR | Status: DC

## 2012-08-10 NOTE — Progress Notes (Signed)
TDAP given on 07/04/12 at same time as Rhogam.  Patient would like to get flu shot today.  Wants to know if she needs thyroid checked today.

## 2012-08-10 NOTE — Patient Instructions (Signed)
Contraception Choices Contraception (birth control) is the use of any methods or devices to prevent pregnancy. Below are some methods to help avoid pregnancy. HORMONAL METHODS   Contraceptive implant. This is a thin, plastic tube containing progesterone hormone. It does not contain estrogen hormone. Your caregiver inserts the tube in the inner part of the upper arm. The tube can remain in place for up to 3 years. After 3 years, the implant must be removed. The implant prevents the ovaries from releasing an egg (ovulation), thickens the cervical mucus which prevents sperm from entering the uterus, and thins the lining of the inside of the uterus.  Progesterone-only injections. These injections are given every 3 months by your caregiver to prevent pregnancy. This synthetic progesterone hormone stops the ovaries from releasing eggs. It also thickens cervical mucus and changes the uterine lining. This makes it harder for sperm to survive in the uterus.  Birth control pills. These pills contain estrogen and progesterone hormone. They work by stopping the egg from forming in the ovary (ovulation). Birth control pills are prescribed by a caregiver.Birth control pills can also be used to treat heavy periods.  Minipill. This type of birth control pill contains only the progesterone hormone. They are taken every day of each month and must be prescribed by your caregiver.  Birth control patch. The patch contains hormones similar to those in birth control pills. It must be changed once a week and is prescribed by a caregiver.  Vaginal ring. The ring contains hormones similar to those in birth control pills. It is left in the vagina for 3 weeks, removed for 1 week, and then a new one is put back in place. The patient must be comfortable inserting and removing the ring from the vagina.A caregiver's prescription is necessary.  Emergency contraception. Emergency contraceptives prevent pregnancy after unprotected  sexual intercourse. This pill can be taken right after sex or up to 5 days after unprotected sex. It is most effective the sooner you take the pills after having sexual intercourse. Emergency contraceptive pills are available without a prescription. Check with your pharmacist. Do not use emergency contraception as your only form of birth control. BARRIER METHODS   Female condom. This is a thin sheath (latex or rubber) that is worn over the penis during sexual intercourse. It can be used with spermicide to increase effectiveness.  Female condom. This is a soft, loose-fitting sheath that is put into the vagina before sexual intercourse.  Diaphragm. This is a soft, latex, dome-shaped barrier that must be fitted by a caregiver. It is inserted into the vagina, along with a spermicidal jelly. It is inserted before intercourse. The diaphragm should be left in the vagina for 6 to 8 hours after intercourse.  Cervical cap. This is a round, soft, latex or plastic cup that fits over the cervix and must be fitted by a caregiver. The cap can be left in place for up to 48 hours after intercourse.  Sponge. This is a soft, circular piece of polyurethane foam. The sponge has spermicide in it. It is inserted into the vagina after wetting it and before sexual intercourse.  Spermicides. These are chemicals that kill or block sperm from entering the cervix and uterus. They come in the form of creams, jellies, suppositories, foam, or tablets. They do not require a prescription. They are inserted into the vagina with an applicator before having sexual intercourse. The process must be repeated every time you have sexual intercourse. INTRAUTERINE CONTRACEPTION  Intrauterine device (  IUD). This is a T-shaped device that is put in a woman's uterus during a menstrual period to prevent pregnancy. There are 2 types:  Copper IUD. This type of IUD is wrapped in copper wire and is placed inside the uterus. Copper makes the uterus and  fallopian tubes produce a fluid that kills sperm. It can stay in place for 10 years.  Hormone IUD. This type of IUD contains the hormone progestin (synthetic progesterone). The hormone thickens the cervical mucus and prevents sperm from entering the uterus, and it also thins the uterine lining to prevent implantation of a fertilized egg. The hormone can weaken or kill the sperm that get into the uterus. It can stay in place for 5 years. PERMANENT METHODS OF CONTRACEPTION  Female tubal ligation. This is when the woman's fallopian tubes are surgically sealed, tied, or blocked to prevent the egg from traveling to the uterus.  Female sterilization. This is when the female has the tubes that carry sperm tied off (vasectomy).This blocks sperm from entering the vagina during sexual intercourse. After the procedure, the man can still ejaculate fluid (semen). NATURAL PLANNING METHODS  Natural family planning. This is not having sexual intercourse or using a barrier method (condom, diaphragm, cervical cap) on days the woman could become pregnant.  Calendar method. This is keeping track of the length of each menstrual cycle and identifying when you are fertile.  Ovulation method. This is avoiding sexual intercourse during ovulation.  Symptothermal method. This is avoiding sexual intercourse during ovulation, using a thermometer and ovulation symptoms.  Post-ovulation method. This is timing sexual intercourse after you have ovulated. Regardless of which type or method of contraception you choose, it is important that you use condoms to protect against the transmission of sexually transmitted diseases (STDs). Talk with your caregiver about which form of contraception is most appropriate for you. Document Released: 10/24/2005 Document Revised: 01/16/2012 Document Reviewed: 03/02/2011 [employer] Patient Information 2013 [employer]. Breastfeeding Deciding to breastfeed is one of the best choices you can make  for you and your baby. The information that follows gives a brief overview of the benefits of breastfeeding as well as common topics surrounding breastfeeding. BENEFITS OF BREASTFEEDING For the baby  The first milk (colostrum) helps the baby's digestive system function better.   There are antibodies in the mother's milk that help the baby fight off infections.   The baby has a lower incidence of asthma, allergies, and sudden infant death syndrome (SIDS).   The nutrients in breast milk are better for the baby than infant formulas, and breast milk helps the baby's brain grow better.   Babies who breastfeed have less gas, colic, and constipation.  For the mother  Breastfeeding helps develop a very special bond between the mother and her baby.   Breastfeeding is convenient, always available at the correct temperature, and costs nothing.   Breastfeeding burns calories in the mother and helps her lose weight that was gained during pregnancy.   Breastfeeding makes the uterus contract back down to normal size faster and slows bleeding following delivery.   Breastfeeding mothers have a lower risk of developing breast cancer.  BREASTFEEDING FREQUENCY  A healthy, full-term baby may breastfeed as often as every hour or space his or her feedings to every 3 hours.   Watch your baby for signs of hunger. Nurse your baby if he or she shows signs of hunger. How often you nurse will vary from baby to baby.   Nurse as often as   the baby requests, or when you feel the need to reduce the fullness of your breasts.   Awaken the baby if it has been 3 4 hours since the last feeding.   Frequent feeding will help the mother make more milk and will help prevent problems, such as sore nipples and engorgement of the breasts.  BABY'S POSITION AT THE BREAST  Whether lying down or sitting, be sure that the baby's tummy is facing your tummy.   Support the breast with 4 fingers underneath the  breast and the thumb above. Make sure your fingers are well away from the nipple and baby's mouth.   Stroke the baby's lips gently with your finger or nipple.   When the baby's mouth is open wide enough, place all of your nipple and as much of the areola as possible into your baby's mouth.   Pull the baby in close so the tip of the nose and the baby's cheeks touch the breast during the feeding.  FEEDINGS AND SUCTION  The length of each feeding varies from baby to baby and from feeding to feeding.   The baby must suck about 2 3 minutes for your milk to get to him or her. This is called a "let down." For this reason, allow the baby to feed on each breast as long as he or she wants. Your baby will end the feeding when he or she has received the right balance of nutrients.   To break the suction, put your finger into the corner of the baby's mouth and slide it between his or her gums before removing your breast from his or her mouth. This will help prevent sore nipples.  HOW TO TELL WHETHER YOUR BABY IS GETTING ENOUGH BREAST MILK. Wondering whether or not your baby is getting enough milk is a common concern among mothers. You can be assured that your baby is getting enough milk if:   Your baby is actively sucking and you hear swallowing.   Your baby seems relaxed and satisfied after a feeding.   Your baby nurses at least 8 12 times in a 24 hour time period. Nurse your baby until he or she unlatches or falls asleep at the first breast (at least 10 20 minutes), then offer the second side.   Your baby is wetting 5 6 disposable diapers (6 8 cloth diapers) in a 24 hour period by 5 6 days of age.   Your baby is having at least 3 4 stools every 24 hours for the first 6 weeks. The stool should be soft and yellow.   Your baby should gain 4 7 ounces per week after he or she is 4 days old.   Your breasts feel softer after nursing.  REDUCING BREAST ENGORGEMENT  In the first week after  your baby is born, you may experience signs of breast engorgement. When breasts are engorged, they feel heavy, warm, full, and may be tender to the touch. You can reduce engorgement if you:   Nurse frequently, every 2 3 hours. Mothers who breastfeed early and often have fewer problems with engorgement.   Place light ice packs on your breasts for 10 20 minutes between feedings. This reduces swelling. Wrap the ice packs in a lightweight towel to protect your skin. Bags of frozen vegetables work well for this purpose.   Take a warm shower or apply warm, moist heat to your breast for 5 10 minutes just before each feeding. This increases circulation and helps the   milk flow.   Gently massage your breast before and during the feeding. Using your finger tips, massage from the chest wall towards your nipple in a circular motion.   Make sure that the baby empties at least one breast at every feeding before switching sides.   Use a breast pump to empty the breasts if your baby is sleepy or not nursing well. You may also want to pump if you are returning to work oryou feel you are getting engorged.   Avoid bottle feeds, pacifiers, or supplemental feedings of water or juice in place of breastfeeding. Breast milk is all the food your baby needs. It is not necessary for your baby to have water or formula. In fact, to help your breasts make more milk, it is best not to give your baby supplemental feedings during the early weeks.   Be sure the baby is latched on and positioned properly while breastfeeding.   Wear a supportive bra, avoiding underwire styles.   Eat a balanced diet with enough fluids.   Rest often, relax, and take your prenatal vitamins to prevent fatigue, stress, and anemia.  If you follow these suggestions, your engorgement should improve in 24 48 hours. If you are still experiencing difficulty, call your lactation consultant or caregiver.  CARING FOR YOURSELF Take care of your  breasts  Bathe or shower daily.   Avoid using soap on your nipples.   Start feedings on your left breast at one feeding and on your right breast at the next feeding.   You will notice an increase in your milk supply 2 5 days after delivery. You may feel some discomfort from engorgement, which makes your breasts very firm and often tender. Engorgement "peaks" out within 24 48 hours. In the meantime, apply warm moist towels to your breasts for 5 10 minutes before feeding. Gentle massage and expression of some milk before feeding will soften your breasts, making it easier for your baby to latch on.   Wear a well-fitting nursing bra, and air dry your nipples for a 3 4minutes after each feeding.   Only use cotton bra pads.   Only use pure lanolin on your nipples after nursing. You do not need to wash it off before feeding the baby again. Another option is to express a few drops of breast milk and gently massage it into your nipples.  Take care of yourself  Eat well-balanced meals and nutritious snacks.   Drinking milk, fruit juice, and water to satisfy your thirst (about 8 glasses a day).   Get plenty of rest.  Avoid foods that you notice affect the baby in a bad way.  SEEK MEDICAL CARE IF:   You have difficulty with breastfeeding and need help.   You have a hard, red, sore area on your breast that is accompanied by a fever.   Your baby is too sleepy to eat well or is having trouble sleeping.   Your baby is wetting less than 6 diapers a day, by 5 days of age.   Your baby's skin or white part of his or her eyes is more yellow than it was in the hospital.   You feel depressed.  Document Released: 10/24/2005 Document Revised: 04/24/2012 Document Reviewed: 01/22/2012 [employer] Patient Information 2013 [employer].  

## 2012-08-10 NOTE — Progress Notes (Signed)
Doing well--in 2x/wk testing secondary to poor growth in baby B.  Elevated Dopplers but no reverse or absent flow.  For growth next wk. Check TSH and Free T3 and Free T4 today. Flu shot.

## 2012-08-13 ENCOUNTER — Other Ambulatory Visit: Payer: Self-pay | Admitting: Obstetrics & Gynecology

## 2012-08-13 ENCOUNTER — Ambulatory Visit (HOSPITAL_COMMUNITY)
Admission: RE | Admit: 2012-08-13 | Discharge: 2012-08-13 | Disposition: A | Discharge status: Home / Self Care | Payer: PRIVATE HEALTH INSURANCE | Source: Ambulatory Visit | Attending: Family Medicine | Admitting: Family Medicine

## 2012-08-13 ENCOUNTER — Other Ambulatory Visit: Payer: Self-pay | Admitting: Family Medicine

## 2012-08-13 VITALS — BP 104/71 | HR 104 | Wt 159.0 lb

## 2012-08-13 DIAGNOSIS — E0590 Thyrotoxicosis, unspecified without thyrotoxic crisis or storm: Secondary | ICD-10-CM

## 2012-08-13 DIAGNOSIS — O365990 Maternal care for other known or suspected poor fetal growth, unspecified trimester, not applicable or unspecified: Secondary | ICD-10-CM

## 2012-08-13 DIAGNOSIS — O30009 Twin pregnancy, unspecified number of placenta and unspecified number of amniotic sacs, unspecified trimester: Secondary | ICD-10-CM

## 2012-08-13 DIAGNOSIS — O99280 Endocrine, nutritional and metabolic diseases complicating pregnancy, unspecified trimester: Secondary | ICD-10-CM

## 2012-08-13 DIAGNOSIS — E079 Disorder of thyroid, unspecified: Secondary | ICD-10-CM | POA: Insufficient documentation

## 2012-08-13 DIAGNOSIS — O358XX0 Maternal care for other (suspected) fetal abnormality and damage, not applicable or unspecified: Secondary | ICD-10-CM

## 2012-08-13 DIAGNOSIS — O0990 Supervision of high risk pregnancy, unspecified, unspecified trimester: Secondary | ICD-10-CM

## 2012-08-13 MED ORDER — METHIMAZOLE 5 MG PO TABS: 2.5000 mg | ORAL_TABLET | Freq: Every day | ORAL | Status: DC

## 2012-08-13 NOTE — Progress Notes (Signed)
TSH is high--showing overtreatment--will drop her tapazole to 1/2 tab daily..... Spoke with patient and she will decrease her dose and follow up at her next regular appointment.

## 2012-08-16 ENCOUNTER — Ambulatory Visit (HOSPITAL_COMMUNITY): Payer: PRIVATE HEALTH INSURANCE

## 2012-08-16 ENCOUNTER — Ambulatory Visit (HOSPITAL_COMMUNITY)
Admission: RE | Admit: 2012-08-16 | Discharge: 2012-08-16 | Disposition: A | Discharge status: Home / Self Care | Payer: PRIVATE HEALTH INSURANCE | Source: Ambulatory Visit | Attending: Obstetrics & Gynecology | Admitting: Obstetrics & Gynecology

## 2012-08-16 VITALS — BP 106/68 | HR 106 | Wt 158.5 lb

## 2012-08-16 DIAGNOSIS — O30009 Twin pregnancy, unspecified number of placenta and unspecified number of amniotic sacs, unspecified trimester: Secondary | ICD-10-CM

## 2012-08-16 DIAGNOSIS — O0990 Supervision of high risk pregnancy, unspecified, unspecified trimester: Secondary | ICD-10-CM

## 2012-08-16 DIAGNOSIS — O365990 Maternal care for other known or suspected poor fetal growth, unspecified trimester, not applicable or unspecified: Secondary | ICD-10-CM | POA: Insufficient documentation

## 2012-08-16 DIAGNOSIS — O99280 Endocrine, nutritional and metabolic diseases complicating pregnancy, unspecified trimester: Secondary | ICD-10-CM

## 2012-08-16 DIAGNOSIS — E0590 Thyrotoxicosis, unspecified without thyrotoxic crisis or storm: Secondary | ICD-10-CM

## 2012-08-16 DIAGNOSIS — O358XX0 Maternal care for other (suspected) fetal abnormality and damage, not applicable or unspecified: Secondary | ICD-10-CM

## 2012-08-16 DIAGNOSIS — E079 Disorder of thyroid, unspecified: Secondary | ICD-10-CM | POA: Insufficient documentation

## 2012-08-16 DIAGNOSIS — O35EXX0 Maternal care for other (suspected) fetal abnormality and damage, fetal genitourinary anomalies, not applicable or unspecified: Secondary | ICD-10-CM

## 2012-08-16 NOTE — Progress Notes (Signed)
Kimberly [name] had an ultrasound appointment today.  Please see AS-OB/GYN report for details.  Comments Indication: 26 yr old G1P0 at [redacted]w[redacted]d with dichorionic/diamniotic twin gestation with fetal growth restriction and abnormal Doppler studies in twin B for biophysical profile. NST was added due to initial BPP of 6/8 in twin B.  Twin A: Active fetus Normal amniotic fluid volume No dysmorphic features noted on today's images BPP 10/10; reactive NST The umbilical artery S/D is normal for gestational age. Pyelectasis (previously noted) resolved as of August 15, 2012  Twin B: Active fetus, with previously noted growth restriction Normal amniotic fluid volume No dysmorphic features noted on today's images BPP 8/10 (-2 Breathing); reactive NST The umbilical artery S/D is >2SD above the mean for gestational age (elevated).  There is no notation of either absence or reversal of diastolic flow in any of the umbilical artery Doppler waveforms.  Impressions: 1. Active dichorionic/diamniotic twin gestation; the dividing membrane is seen. 2. Twin A with a posterior placenta; twin B with an anterior placenta; there is no evidence of previa. 3. Normal amniotic fluid volume for both fetuses. 4. Reassuring biophysical profiles in Twin A  of 10/10 and in Twin B of 8/10 (-2 no breathing). 5. Fetal growth restriction of twin B.   6. Normal umbilical artery Doppler studies in twin A; again seen in twin B is elevated systolic:diastolic ratio on umbilical artery Doppler studies. There is persistent forward flow.  Recommendations 1. Continue twice weekly BPPs and Doppler studies. 2. Last fetal growth on Aug 15, 2012 and will need a repeat evaluation in about 3 weeks. 3. Has received a course of betamethasone (07/24/12). 4. Tentativley plan delivery by 37 weeks for this twin pair with growth restriction of twin B, provided that testing remains reassuring in the interim.  Rogelia Boga, MD, MS,  FACOG Assistant Professor Section of Maternal-Fetal Medicine [facility]

## 2012-08-16 NOTE — Progress Notes (Signed)
Review of the fetal heart rate tracings in this di/di twin pair demonstrates the following:  Fetus A:  Reactive NST Fetus B:  Reactive NST  Rogelia Boga, MD, MS, [facility]

## 2012-08-16 NOTE — ED Notes (Signed)
Pt placed on fetal monitor for NST at this time.  BPP A-8/8, B-6/8.

## 2012-08-17 ENCOUNTER — Other Ambulatory Visit: Payer: Self-pay | Admitting: Obstetrics & Gynecology

## 2012-08-17 DIAGNOSIS — O30009 Twin pregnancy, unspecified number of placenta and unspecified number of amniotic sacs, unspecified trimester: Secondary | ICD-10-CM

## 2012-08-17 DIAGNOSIS — E079 Disorder of thyroid, unspecified: Secondary | ICD-10-CM

## 2012-08-17 DIAGNOSIS — O365990 Maternal care for other known or suspected poor fetal growth, unspecified trimester, not applicable or unspecified: Secondary | ICD-10-CM

## 2012-08-20 ENCOUNTER — Ambulatory Visit (HOSPITAL_COMMUNITY)
Admission: RE | Admit: 2012-08-20 | Discharge: 2012-08-20 | Disposition: A | Discharge status: Home / Self Care | Payer: PRIVATE HEALTH INSURANCE | Source: Ambulatory Visit | Attending: Obstetrics & Gynecology | Admitting: Obstetrics & Gynecology

## 2012-08-20 VITALS — BP 101/58 | HR 108 | Wt 157.5 lb

## 2012-08-20 DIAGNOSIS — O30009 Twin pregnancy, unspecified number of placenta and unspecified number of amniotic sacs, unspecified trimester: Secondary | ICD-10-CM

## 2012-08-20 DIAGNOSIS — O30049 Twin pregnancy, dichorionic/diamniotic, unspecified trimester: Secondary | ICD-10-CM | POA: Insufficient documentation

## 2012-08-20 DIAGNOSIS — E079 Disorder of thyroid, unspecified: Secondary | ICD-10-CM

## 2012-08-20 DIAGNOSIS — O35EXX0 Maternal care for other (suspected) fetal abnormality and damage, fetal genitourinary anomalies, not applicable or unspecified: Secondary | ICD-10-CM

## 2012-08-20 DIAGNOSIS — O365990 Maternal care for other known or suspected poor fetal growth, unspecified trimester, not applicable or unspecified: Secondary | ICD-10-CM

## 2012-08-20 DIAGNOSIS — O99280 Endocrine, nutritional and metabolic diseases complicating pregnancy, unspecified trimester: Secondary | ICD-10-CM

## 2012-08-20 DIAGNOSIS — O358XX0 Maternal care for other (suspected) fetal abnormality and damage, not applicable or unspecified: Secondary | ICD-10-CM

## 2012-08-20 DIAGNOSIS — E0590 Thyrotoxicosis, unspecified without thyrotoxic crisis or storm: Secondary | ICD-10-CM

## 2012-08-20 DIAGNOSIS — O0990 Supervision of high risk pregnancy, unspecified, unspecified trimester: Secondary | ICD-10-CM

## 2012-08-20 NOTE — Progress Notes (Signed)
Kimberly [name]  was seen today for an ultrasound appointment.  See full report in [facility]-OB/GYN.  Alpha Gula, MD  Dichorionic/diamniotic twin pregnancy at 33+0 weeks  Normal amniotic fluid volume x 2  Twin A:  EFW at 44th %tile (on 10/7) Active fetus with BPP 8/8 Normal umbilical artery Doppler studies for gestational age  28 B:  EFW < 10th %tile (on 10/7) Elevated UA Doppler studies (> 97th %tile) but no evidence of absent or reversed diastolic flow BPP 8/8  Completed course of betamethasone on 9/17.  Continue twice weekly testing Growth in 1-2 weeks

## 2012-08-23 ENCOUNTER — Ambulatory Visit (HOSPITAL_COMMUNITY)
Admission: RE | Admit: 2012-08-23 | Discharge: 2012-08-23 | Disposition: A | Discharge status: Home / Self Care | Payer: PRIVATE HEALTH INSURANCE | Source: Ambulatory Visit | Attending: Obstetrics & Gynecology | Admitting: Obstetrics & Gynecology

## 2012-08-23 VITALS — BP 115/65 | HR 112 | Wt 160.5 lb

## 2012-08-23 DIAGNOSIS — O358XX0 Maternal care for other (suspected) fetal abnormality and damage, not applicable or unspecified: Secondary | ICD-10-CM

## 2012-08-23 DIAGNOSIS — O30049 Twin pregnancy, dichorionic/diamniotic, unspecified trimester: Secondary | ICD-10-CM | POA: Insufficient documentation

## 2012-08-23 DIAGNOSIS — E079 Disorder of thyroid, unspecified: Secondary | ICD-10-CM

## 2012-08-23 DIAGNOSIS — O0990 Supervision of high risk pregnancy, unspecified, unspecified trimester: Secondary | ICD-10-CM

## 2012-08-23 DIAGNOSIS — O99280 Endocrine, nutritional and metabolic diseases complicating pregnancy, unspecified trimester: Secondary | ICD-10-CM

## 2012-08-23 DIAGNOSIS — O365990 Maternal care for other known or suspected poor fetal growth, unspecified trimester, not applicable or unspecified: Secondary | ICD-10-CM

## 2012-08-23 DIAGNOSIS — E0590 Thyrotoxicosis, unspecified without thyrotoxic crisis or storm: Secondary | ICD-10-CM

## 2012-08-23 DIAGNOSIS — O35EXX0 Maternal care for other (suspected) fetal abnormality and damage, fetal genitourinary anomalies, not applicable or unspecified: Secondary | ICD-10-CM

## 2012-08-23 DIAGNOSIS — O30009 Twin pregnancy, unspecified number of placenta and unspecified number of amniotic sacs, unspecified trimester: Secondary | ICD-10-CM

## 2012-08-23 NOTE — Progress Notes (Signed)
Kimberly [name]  was seen today for an ultrasound appointment.  See full report in [facility]-OB/GYN.  Alpha Gula, MD  Dichorionic/diamniotic twin pregnancy at 33+3 weeks  Normal amniotic fluid volume x 2  Twin A:  Active fetus with BPP 8/10 (-2 to absent breathing activity) Normal umbilical artery Doppler studies for gestational age  28 B:  EFW < 10th %tile (on 10/7) Elevated UA Doppler studies (> 97th %tile) but no evidence of absent or reversed diastolic flow BPP 8/8  Completed course of betamethasone on 9/17.  Continue 2x weekly BPPs/ UA Dopplers Follow up growth scan on 10/28

## 2012-08-24 ENCOUNTER — Other Ambulatory Visit: Payer: Self-pay | Admitting: Obstetrics & Gynecology

## 2012-08-24 ENCOUNTER — Ambulatory Visit (INDEPENDENT_AMBULATORY_CARE_PROVIDER_SITE_OTHER): Payer: PRIVATE HEALTH INSURANCE | Admitting: Obstetrics & Gynecology

## 2012-08-24 ENCOUNTER — Encounter: Payer: Self-pay | Admitting: Obstetrics & Gynecology

## 2012-08-24 ENCOUNTER — Ambulatory Visit (HOSPITAL_COMMUNITY)
Admission: RE | Admit: 2012-08-24 | Discharge: 2012-08-24 | Disposition: A | Discharge status: Home / Self Care | Payer: PRIVATE HEALTH INSURANCE | Source: Ambulatory Visit | Attending: Obstetrics & Gynecology | Admitting: Obstetrics & Gynecology

## 2012-08-24 VITALS — BP 113/74 | HR 111 | Wt 160.0 lb

## 2012-08-24 VITALS — BP 115/70 | Wt 159.0 lb

## 2012-08-24 DIAGNOSIS — O365990 Maternal care for other known or suspected poor fetal growth, unspecified trimester, not applicable or unspecified: Secondary | ICD-10-CM

## 2012-08-24 DIAGNOSIS — O0990 Supervision of high risk pregnancy, unspecified, unspecified trimester: Secondary | ICD-10-CM

## 2012-08-24 DIAGNOSIS — O30009 Twin pregnancy, unspecified number of placenta and unspecified number of amniotic sacs, unspecified trimester: Secondary | ICD-10-CM

## 2012-08-24 DIAGNOSIS — O288 Other abnormal findings on antenatal screening of mother: Secondary | ICD-10-CM

## 2012-08-24 DIAGNOSIS — E0590 Thyrotoxicosis, unspecified without thyrotoxic crisis or storm: Secondary | ICD-10-CM

## 2012-08-24 DIAGNOSIS — E079 Disorder of thyroid, unspecified: Secondary | ICD-10-CM | POA: Insufficient documentation

## 2012-08-24 DIAGNOSIS — O358XX0 Maternal care for other (suspected) fetal abnormality and damage, not applicable or unspecified: Secondary | ICD-10-CM

## 2012-08-24 DIAGNOSIS — O99280 Endocrine, nutritional and metabolic diseases complicating pregnancy, unspecified trimester: Secondary | ICD-10-CM

## 2012-08-24 DIAGNOSIS — Z3480 Encounter for supervision of other normal pregnancy, unspecified trimester: Secondary | ICD-10-CM

## 2012-08-24 DIAGNOSIS — O289 Unspecified abnormal findings on antenatal screening of mother: Secondary | ICD-10-CM | POA: Insufficient documentation

## 2012-08-24 LAB — OB RESULTS CONSOLE GBS: GBS: NEGATIVE

## 2012-08-24 NOTE — Progress Notes (Signed)
[facility]  Indication: 26 yr old G1P0 at [redacted]w[redacted]d with dichorionic/diamniotic twin gestation with fetal growth restriction and abnormal Doppler studies in twin B for biophysical profile secondary to nonreactive NST in the office.  Findings: 1. Dichorionic/diamniotic twin gestation; the dividing membrane is seen. 2. Twin A with a posterior placenta; twin B with an anterior placenta; there is no evidence of previa. 3. Normal amniotic fluid volume for both fetuses. 4. Normal biophysical profiles of 8/8.  Recommendations: 1. Fetal growth restriction of twin B: previously counseled. Elevated umbilical artery Doppler studies but persistent forward flow- on exam yesterday. Continue twice weekly BPPs and Doppler studies. Has received a course of betamethasone. For fetal growth on 09/03/12. 2. Previous finding of pyelectasis in twin A: not evaluated on today's exam; will be evalauted on the next exam. 3. Hyperthyroidism: well controlled. 4. Recommend delivery at 37 weeks or sooner for nonreassuring fetal testing, poor interval fetal growth, or reversal of flow on Doppler studies.  Eulis Foster, MD

## 2012-08-24 NOTE — Progress Notes (Signed)
Routine visit. Thyroid studies stable/ok. Good FM x 2. She goes to MFM weekly and gets twice weekly testing. Cultures today. Labor precautions. NST today NR x 2. She will go to get a BPP now.

## 2012-08-25 LAB — GC/CHLAMYDIA PROBE AMP, GENITAL: Chlamydia, DNA Probe: NEGATIVE

## 2012-08-27 ENCOUNTER — Other Ambulatory Visit (HOSPITAL_COMMUNITY): Payer: Self-pay | Admitting: Obstetrics and Gynecology

## 2012-08-27 ENCOUNTER — Ambulatory Visit (HOSPITAL_COMMUNITY)
Admission: RE | Admit: 2012-08-27 | Discharge: 2012-08-27 | Disposition: A | Discharge status: Home / Self Care | Payer: PRIVATE HEALTH INSURANCE | Source: Ambulatory Visit | Attending: Obstetrics & Gynecology | Admitting: Obstetrics & Gynecology

## 2012-08-27 VITALS — BP 122/74 | HR 122 | Wt 161.5 lb

## 2012-08-27 DIAGNOSIS — O365990 Maternal care for other known or suspected poor fetal growth, unspecified trimester, not applicable or unspecified: Secondary | ICD-10-CM

## 2012-08-27 DIAGNOSIS — O30009 Twin pregnancy, unspecified number of placenta and unspecified number of amniotic sacs, unspecified trimester: Secondary | ICD-10-CM

## 2012-08-27 DIAGNOSIS — O99280 Endocrine, nutritional and metabolic diseases complicating pregnancy, unspecified trimester: Secondary | ICD-10-CM

## 2012-08-27 DIAGNOSIS — E0590 Thyrotoxicosis, unspecified without thyrotoxic crisis or storm: Secondary | ICD-10-CM

## 2012-08-27 DIAGNOSIS — O358XX0 Maternal care for other (suspected) fetal abnormality and damage, not applicable or unspecified: Secondary | ICD-10-CM

## 2012-08-27 DIAGNOSIS — O35EXX0 Maternal care for other (suspected) fetal abnormality and damage, fetal genitourinary anomalies, not applicable or unspecified: Secondary | ICD-10-CM

## 2012-08-27 DIAGNOSIS — O0990 Supervision of high risk pregnancy, unspecified, unspecified trimester: Secondary | ICD-10-CM

## 2012-08-27 DIAGNOSIS — E079 Disorder of thyroid, unspecified: Secondary | ICD-10-CM | POA: Insufficient documentation

## 2012-08-27 LAB — CULTURE, BETA STREP (GROUP B ONLY)

## 2012-08-27 NOTE — Progress Notes (Signed)
Eloisa Northern  was seen today for an ultrasound appointment.  See full report in AS-OB/GYN.  Alpha Gula, MD  Dichorionic/diamniotic twin pregnancy at 53 0/7 weeks  Normal amniotic fluid volume x 2  Twin A:  Active fetus with BPP 8/8 Normal umbilical artery Doppler studies for gestational age  28 B:  EFW < 10th %tile (on 10/7) Elevated UA Doppler studies (> 97th %tile) but no evidence of absent or reversed diastolic flow. BPP 8/8  Completed course of betamethasone on 9/17.  Continue 2x weekly BPPs/ UA Dopplers Follow up growth scan on 10/28

## 2012-08-28 ENCOUNTER — Other Ambulatory Visit (HOSPITAL_COMMUNITY): Payer: Self-pay | Admitting: Maternal and Fetal Medicine

## 2012-08-28 DIAGNOSIS — O30009 Twin pregnancy, unspecified number of placenta and unspecified number of amniotic sacs, unspecified trimester: Secondary | ICD-10-CM

## 2012-08-28 DIAGNOSIS — O365990 Maternal care for other known or suspected poor fetal growth, unspecified trimester, not applicable or unspecified: Secondary | ICD-10-CM

## 2012-08-30 ENCOUNTER — Ambulatory Visit (HOSPITAL_COMMUNITY)
Admission: RE | Admit: 2012-08-30 | Discharge: 2012-08-30 | Disposition: A | Discharge status: Home / Self Care | Payer: PRIVATE HEALTH INSURANCE | Source: Ambulatory Visit | Attending: Obstetrics & Gynecology | Admitting: Obstetrics & Gynecology

## 2012-08-30 VITALS — BP 104/67 | HR 117 | Wt 161.5 lb

## 2012-08-30 DIAGNOSIS — Z1389 Encounter for screening for other disorder: Secondary | ICD-10-CM | POA: Insufficient documentation

## 2012-08-30 DIAGNOSIS — E0590 Thyrotoxicosis, unspecified without thyrotoxic crisis or storm: Secondary | ICD-10-CM

## 2012-08-30 DIAGNOSIS — O35EXX0 Maternal care for other (suspected) fetal abnormality and damage, fetal genitourinary anomalies, not applicable or unspecified: Secondary | ICD-10-CM

## 2012-08-30 DIAGNOSIS — O365990 Maternal care for other known or suspected poor fetal growth, unspecified trimester, not applicable or unspecified: Secondary | ICD-10-CM

## 2012-08-30 DIAGNOSIS — O0990 Supervision of high risk pregnancy, unspecified, unspecified trimester: Secondary | ICD-10-CM

## 2012-08-30 DIAGNOSIS — Z363 Encounter for antenatal screening for malformations: Secondary | ICD-10-CM | POA: Insufficient documentation

## 2012-08-30 DIAGNOSIS — O30009 Twin pregnancy, unspecified number of placenta and unspecified number of amniotic sacs, unspecified trimester: Secondary | ICD-10-CM

## 2012-08-30 DIAGNOSIS — O358XX0 Maternal care for other (suspected) fetal abnormality and damage, not applicable or unspecified: Secondary | ICD-10-CM

## 2012-08-30 DIAGNOSIS — O99280 Endocrine, nutritional and metabolic diseases complicating pregnancy, unspecified trimester: Secondary | ICD-10-CM

## 2012-08-30 DIAGNOSIS — E079 Disorder of thyroid, unspecified: Secondary | ICD-10-CM | POA: Insufficient documentation

## 2012-09-03 ENCOUNTER — Other Ambulatory Visit (HOSPITAL_COMMUNITY): Payer: Self-pay | Admitting: Maternal and Fetal Medicine

## 2012-09-03 ENCOUNTER — Ambulatory Visit (HOSPITAL_COMMUNITY)
Admission: RE | Admit: 2012-09-03 | Discharge: 2012-09-03 | Disposition: A | Discharge status: Home / Self Care | Payer: PRIVATE HEALTH INSURANCE | Source: Ambulatory Visit | Attending: Obstetrics & Gynecology | Admitting: Obstetrics & Gynecology

## 2012-09-03 ENCOUNTER — Encounter: Payer: Self-pay | Admitting: Obstetrics & Gynecology

## 2012-09-03 VITALS — BP 102/62 | HR 104 | Wt 162.5 lb

## 2012-09-03 DIAGNOSIS — O365990 Maternal care for other known or suspected poor fetal growth, unspecified trimester, not applicable or unspecified: Secondary | ICD-10-CM

## 2012-09-03 DIAGNOSIS — E079 Disorder of thyroid, unspecified: Secondary | ICD-10-CM | POA: Insufficient documentation

## 2012-09-03 DIAGNOSIS — O358XX0 Maternal care for other (suspected) fetal abnormality and damage, not applicable or unspecified: Secondary | ICD-10-CM

## 2012-09-03 DIAGNOSIS — O0990 Supervision of high risk pregnancy, unspecified, unspecified trimester: Secondary | ICD-10-CM

## 2012-09-03 DIAGNOSIS — O30009 Twin pregnancy, unspecified number of placenta and unspecified number of amniotic sacs, unspecified trimester: Secondary | ICD-10-CM

## 2012-09-03 DIAGNOSIS — O30049 Twin pregnancy, dichorionic/diamniotic, unspecified trimester: Secondary | ICD-10-CM | POA: Insufficient documentation

## 2012-09-03 DIAGNOSIS — O99280 Endocrine, nutritional and metabolic diseases complicating pregnancy, unspecified trimester: Secondary | ICD-10-CM

## 2012-09-03 DIAGNOSIS — O35EXX0 Maternal care for other (suspected) fetal abnormality and damage, fetal genitourinary anomalies, not applicable or unspecified: Secondary | ICD-10-CM

## 2012-09-03 DIAGNOSIS — E0590 Thyrotoxicosis, unspecified without thyrotoxic crisis or storm: Secondary | ICD-10-CM

## 2012-09-03 NOTE — Progress Notes (Signed)
[name] had an ultrasound appointment today.  Please see [facility] report for details.  Comments Indication: 26 yr old G1P0 at [redacted]w[redacted]d with dichorionic/diamniotic twin gestation with fetal growth restriction and abnormal Doppler studies in twin B for biophysical profile.  Twin A: Active fetus EFW 28th percentile Normal amniotic fluid volume No dysmorphic features noted on today's images BPP 8/8;  The umbilical artery S/D is normal for gestational age.   Twin B: Active fetus, with previously noted growth restriction in Twin B Today's biometry demonstrate interval growth that continues to lag behind that expected with symmetrically small pattern and EFW <10th percentile Normal amniotic fluid volume No dysmorphic features noted on today's images BPP 8/8 The umbilical artery S/D is >2SD above the mean for gestational age (elevated).  There is no notation of either absence or reversal of diastolic flow in any of the umbilical artery Doppler waveforms.  Impressions: 1. Active dichorionic/diamniotic twin gestation; the dividing membrane is seen in this discordant (40%) twin pair 2. Twin A with a posterior placenta; twin B with an anterior placenta; there is no evidence of previa. 3. Normal amniotic fluid volume for both fetuses. 4. Reassuring biophysical profiles in Twin A  of 8/8 and in Twin B of 8/8. 5. Fetal growth restriction of twin B.   6. Normal umbilical artery Doppler studies in twin A; again seen in twin B is elevated systolic:diastolic ratio on umbilical artery Doppler studies. There is persistent forward flow.  Recommendations 1. Continue twice weekly BPPs and Doppler studies. 2.Tentativley plan delivery between 36-37 weeks for this twin pair with growth restriction of twin B, provided that testing remains reassuring in the interim.  Rogelia Boga, MD, MS, FACOG Assistant Professor Section of Maternal-Fetal Medicine [facility]

## 2012-09-04 ENCOUNTER — Telehealth (HOSPITAL_COMMUNITY): Payer: Self-pay | Admitting: *Deleted

## 2012-09-04 ENCOUNTER — Other Ambulatory Visit: Payer: Self-pay | Admitting: Obstetrics & Gynecology

## 2012-09-04 ENCOUNTER — Ambulatory Visit (INDEPENDENT_AMBULATORY_CARE_PROVIDER_SITE_OTHER): Payer: PRIVATE HEALTH INSURANCE | Admitting: Family Medicine

## 2012-09-04 VITALS — BP 91/68 | Wt 161.0 lb

## 2012-09-04 DIAGNOSIS — O0990 Supervision of high risk pregnancy, unspecified, unspecified trimester: Secondary | ICD-10-CM

## 2012-09-04 DIAGNOSIS — Z6791 Unspecified blood type, Rh negative: Secondary | ICD-10-CM | POA: Insufficient documentation

## 2012-09-04 DIAGNOSIS — O360990 Maternal care for other rhesus isoimmunization, unspecified trimester, not applicable or unspecified: Secondary | ICD-10-CM

## 2012-09-04 DIAGNOSIS — Z3480 Encounter for supervision of other normal pregnancy, unspecified trimester: Secondary | ICD-10-CM

## 2012-09-04 DIAGNOSIS — O26899 Other specified pregnancy related conditions, unspecified trimester: Secondary | ICD-10-CM

## 2012-09-04 DIAGNOSIS — O30009 Twin pregnancy, unspecified number of placenta and unspecified number of amniotic sacs, unspecified trimester: Secondary | ICD-10-CM

## 2012-09-04 NOTE — Patient Instructions (Signed)
Breastfeeding Deciding to breastfeed is one of the best choices you can make for you and your baby. The information that follows gives a brief overview of the benefits of breastfeeding as well as common topics surrounding breastfeeding. BENEFITS OF BREASTFEEDING For the baby  The first milk (colostrum) helps the baby's digestive system function better.   There are antibodies in the mother's milk that help the baby fight off infections.   The baby has a lower incidence of asthma, allergies, and sudden infant death syndrome (SIDS).   The nutrients in breast milk are better for the baby than infant formulas, and breast milk helps the baby's brain grow better.   Babies who breastfeed have less gas, colic, and constipation.  For the mother  Breastfeeding helps develop a very special bond between the mother and her baby.   Breastfeeding is convenient, always available at the correct temperature, and costs nothing.   Breastfeeding burns calories in the mother and helps her lose weight that was gained during pregnancy.   Breastfeeding makes the uterus contract back down to normal size faster and slows bleeding following delivery.   Breastfeeding mothers have a lower risk of developing breast cancer.  BREASTFEEDING FREQUENCY  A healthy, full-term baby may breastfeed as often as every hour or space his or her feedings to every 3 hours.   Watch your baby for signs of hunger. Nurse your baby if he or she shows signs of hunger. How often you nurse will vary from baby to baby.   Nurse as often as the baby requests, or when you feel the need to reduce the fullness of your breasts.   Awaken the baby if it has been 3 4 hours since the last feeding.   Frequent feeding will help the mother make more milk and will help prevent problems, such as sore nipples and engorgement of the breasts.  BABY'S POSITION AT THE BREAST  Whether lying down or sitting, be sure that the baby's tummy is  facing your tummy.   Support the breast with 4 fingers underneath the breast and the thumb above. Make sure your fingers are well away from the nipple and baby's mouth.   Stroke the baby's lips gently with your finger or nipple.   When the baby's mouth is open wide enough, place all of your nipple and as much of the areola as possible into your baby's mouth.   Pull the baby in close so the tip of the nose and the baby's cheeks touch the breast during the feeding.  FEEDINGS AND SUCTION  The length of each feeding varies from baby to baby and from feeding to feeding.   The baby must suck about 2 3 minutes for your milk to get to him or her. This is called a "let down." For this reason, allow the baby to feed on each breast as long as he or she wants. Your baby will end the feeding when he or she has received the right balance of nutrients.   To break the suction, put your finger into the corner of the baby's mouth and slide it between his or her gums before removing your breast from his or her mouth. This will help prevent sore nipples.  HOW TO TELL WHETHER YOUR BABY IS GETTING ENOUGH BREAST MILK. Wondering whether or not your baby is getting enough milk is a common concern among mothers. You can be assured that your baby is getting enough milk if:   Your baby is actively   sucking and you hear swallowing.   Your baby seems relaxed and satisfied after a feeding.   Your baby nurses at least 8 12 times in a 24 hour time period. Nurse your baby until he or she unlatches or falls asleep at the first breast (at least 10 20 minutes), then offer the second side.   Your baby is wetting 5 6 disposable diapers (6 8 cloth diapers) in a 24 hour period by 5 6 days of age.   Your baby is having at least 3 4 stools every 24 hours for the first 6 weeks. The stool should be soft and yellow.   Your baby should gain 4 7 ounces per week after he or she is 4 days old.   Your breasts feel softer  after nursing.  REDUCING BREAST ENGORGEMENT  In the first week after your baby is born, you may experience signs of breast engorgement. When breasts are engorged, they feel heavy, warm, full, and may be tender to the touch. You can reduce engorgement if you:   Nurse frequently, every 2 3 hours. Mothers who breastfeed early and often have fewer problems with engorgement.   Place light ice packs on your breasts for 10 20 minutes between feedings. This reduces swelling. Wrap the ice packs in a lightweight towel to protect your skin. Bags of frozen vegetables work well for this purpose.   Take a warm shower or apply warm, moist heat to your breast for 5 10 minutes just before each feeding. This increases circulation and helps the milk flow.   Gently massage your breast before and during the feeding. Using your finger tips, massage from the chest wall towards your nipple in a circular motion.   Make sure that the baby empties at least one breast at every feeding before switching sides.   Use a breast pump to empty the breasts if your baby is sleepy or not nursing well. You may also want to pump if you are returning to work oryou feel you are getting engorged.   Avoid bottle feeds, pacifiers, or supplemental feedings of water or juice in place of breastfeeding. Breast milk is all the food your baby needs. It is not necessary for your baby to have water or formula. In fact, to help your breasts make more milk, it is best not to give your baby supplemental feedings during the early weeks.   Be sure the baby is latched on and positioned properly while breastfeeding.   Wear a supportive bra, avoiding underwire styles.   Eat a balanced diet with enough fluids.   Rest often, relax, and take your prenatal vitamins to prevent fatigue, stress, and anemia.  If you follow these suggestions, your engorgement should improve in 24 48 hours. If you are still experiencing difficulty, call your  lactation consultant or caregiver.  CARING FOR YOURSELF Take care of your breasts  Bathe or shower daily.   Avoid using soap on your nipples.   Start feedings on your left breast at one feeding and on your right breast at the next feeding.   You will notice an increase in your milk supply 2 5 days after delivery. You may feel some discomfort from engorgement, which makes your breasts very firm and often tender. Engorgement "peaks" out within 24 48 hours. In the meantime, apply warm moist towels to your breasts for 5 10 minutes before feeding. Gentle massage and expression of some milk before feeding will soften your breasts, making it easier for your   baby to latch on.   Wear a well-fitting nursing bra, and air dry your nipples for a 3 4minutes after each feeding.   Only use cotton bra pads.   Only use pure lanolin on your nipples after nursing. You do not need to wash it off before feeding the baby again. Another option is to express a few drops of breast milk and gently massage it into your nipples.  Take care of yourself  Eat well-balanced meals and nutritious snacks.   Drinking milk, fruit juice, and water to satisfy your thirst (about 8 glasses a day).   Get plenty of rest.  Avoid foods that you notice affect the baby in a bad way.  SEEK MEDICAL CARE IF:   You have difficulty with breastfeeding and need help.   You have a hard, red, sore area on your breast that is accompanied by a fever.   Your baby is too sleepy to eat well or is having trouble sleeping.   Your baby is wetting less than 6 diapers a day, by 5 days of age.   Your baby's skin or white part of his or her eyes is more yellow than it was in the hospital.   You feel depressed.  Document Released: 10/24/2005 Document Revised: 04/24/2012 Document Reviewed: 01/22/2012 [employer] Patient Information 2013 [employer].  

## 2012-09-04 NOTE — Telephone Encounter (Signed)
Preadmission screen  

## 2012-09-04 NOTE — Progress Notes (Signed)
U/S from 10/28 shows A-vtx 4 # 12 oz, nml fluid, nml dopplers B-vtx 2# 14 oz, nml fluid, abnl Dopplers with NO absent or reverse flow Plan for delivery at 37 wks via IOL. GBS negative.

## 2012-09-06 ENCOUNTER — Encounter (HOSPITAL_COMMUNITY): Payer: Self-pay

## 2012-09-06 ENCOUNTER — Other Ambulatory Visit: Payer: Self-pay | Admitting: Obstetrics & Gynecology

## 2012-09-06 ENCOUNTER — Ambulatory Visit (HOSPITAL_COMMUNITY)
Admission: RE | Admit: 2012-09-06 | Discharge: 2012-09-06 | Disposition: A | Discharge status: Home / Self Care | Payer: PRIVATE HEALTH INSURANCE | Source: Ambulatory Visit | Attending: Obstetrics & Gynecology | Admitting: Obstetrics & Gynecology

## 2012-09-06 VITALS — BP 103/71 | HR 110 | Wt 163.0 lb

## 2012-09-06 DIAGNOSIS — O358XX0 Maternal care for other (suspected) fetal abnormality and damage, not applicable or unspecified: Secondary | ICD-10-CM

## 2012-09-06 DIAGNOSIS — O365990 Maternal care for other known or suspected poor fetal growth, unspecified trimester, not applicable or unspecified: Secondary | ICD-10-CM

## 2012-09-06 DIAGNOSIS — O30009 Twin pregnancy, unspecified number of placenta and unspecified number of amniotic sacs, unspecified trimester: Secondary | ICD-10-CM

## 2012-09-06 DIAGNOSIS — E0590 Thyrotoxicosis, unspecified without thyrotoxic crisis or storm: Secondary | ICD-10-CM

## 2012-09-06 DIAGNOSIS — E079 Disorder of thyroid, unspecified: Secondary | ICD-10-CM | POA: Insufficient documentation

## 2012-09-06 DIAGNOSIS — O26899 Other specified pregnancy related conditions, unspecified trimester: Secondary | ICD-10-CM

## 2012-09-06 DIAGNOSIS — O0990 Supervision of high risk pregnancy, unspecified, unspecified trimester: Secondary | ICD-10-CM

## 2012-09-06 DIAGNOSIS — O99280 Endocrine, nutritional and metabolic diseases complicating pregnancy, unspecified trimester: Secondary | ICD-10-CM

## 2012-09-06 DIAGNOSIS — O35EXX0 Maternal care for other (suspected) fetal abnormality and damage, fetal genitourinary anomalies, not applicable or unspecified: Secondary | ICD-10-CM

## 2012-09-07 ENCOUNTER — Other Ambulatory Visit (HOSPITAL_COMMUNITY): Payer: Self-pay | Admitting: Maternal and Fetal Medicine

## 2012-09-07 DIAGNOSIS — O30009 Twin pregnancy, unspecified number of placenta and unspecified number of amniotic sacs, unspecified trimester: Secondary | ICD-10-CM

## 2012-09-10 ENCOUNTER — Ambulatory Visit (HOSPITAL_COMMUNITY)
Admission: RE | Admit: 2012-09-10 | Discharge: 2012-09-10 | Disposition: A | Discharge status: Home / Self Care | Payer: PRIVATE HEALTH INSURANCE | Source: Ambulatory Visit | Attending: Obstetrics & Gynecology | Admitting: Obstetrics & Gynecology

## 2012-09-10 VITALS — BP 108/71 | HR 110 | Wt 163.5 lb

## 2012-09-10 DIAGNOSIS — O26899 Other specified pregnancy related conditions, unspecified trimester: Secondary | ICD-10-CM

## 2012-09-10 DIAGNOSIS — E0590 Thyrotoxicosis, unspecified without thyrotoxic crisis or storm: Secondary | ICD-10-CM

## 2012-09-10 DIAGNOSIS — O30049 Twin pregnancy, dichorionic/diamniotic, unspecified trimester: Secondary | ICD-10-CM | POA: Insufficient documentation

## 2012-09-10 DIAGNOSIS — O0990 Supervision of high risk pregnancy, unspecified, unspecified trimester: Secondary | ICD-10-CM

## 2012-09-10 DIAGNOSIS — O99280 Endocrine, nutritional and metabolic diseases complicating pregnancy, unspecified trimester: Secondary | ICD-10-CM

## 2012-09-10 DIAGNOSIS — O30009 Twin pregnancy, unspecified number of placenta and unspecified number of amniotic sacs, unspecified trimester: Secondary | ICD-10-CM

## 2012-09-10 DIAGNOSIS — O365990 Maternal care for other known or suspected poor fetal growth, unspecified trimester, not applicable or unspecified: Secondary | ICD-10-CM

## 2012-09-10 DIAGNOSIS — E079 Disorder of thyroid, unspecified: Secondary | ICD-10-CM | POA: Insufficient documentation

## 2012-09-10 DIAGNOSIS — O358XX0 Maternal care for other (suspected) fetal abnormality and damage, not applicable or unspecified: Secondary | ICD-10-CM

## 2012-09-10 NOTE — Progress Notes (Signed)
[facility]  Indication: 26 yr old G1P0 at [redacted]w[redacted]d with dichorionic/diamniotic twin gestation with fetal growth restriction and abnormal Doppler studies in twin B for biophysical profile and Doppler studies.  Findings: 1. Dichorionic/diamniotic twin gestation; the dividing membrane is seen. 2. Twin A with a posterior placenta; twin B with an anterior placenta; there is no evidence of previa. 3. Normal amniotic fluid volume for both fetuses. 4. Normal biophysical profiles of 8/8. 5. Twin A with normal  umbilical artery Doppler studies; twin B has elevated systolic:diastolic ratio. There is persistent forward flow.  Recommendations: 1. Fetal growth restriction of twin B: previously counseled. Elevated umbilical artery Doppler studies but persistent forward flow. Continue twice weekly BPPs and Doppler studies. Has received a course of betamethasone.. 2. Previous finding of pyelectasis in twin A: resolved. 3. Hyperthyroidism: well controlled. 4. Recommend delivery at 37 weeks or sooner for nonreassuring fetal testing, poor interval fetal growth, or reversal of flow on Doppler studies. Patient is scheduled for delivery in 1 week. Follow up BPP and Doppler studies in 3 days.  Eulis Foster, MD

## 2012-09-12 ENCOUNTER — Ambulatory Visit (INDEPENDENT_AMBULATORY_CARE_PROVIDER_SITE_OTHER): Payer: PRIVATE HEALTH INSURANCE | Admitting: Obstetrics & Gynecology

## 2012-09-12 VITALS — BP 111/75 | Wt 163.0 lb

## 2012-09-12 DIAGNOSIS — Z3480 Encounter for supervision of other normal pregnancy, unspecified trimester: Secondary | ICD-10-CM

## 2012-09-12 DIAGNOSIS — O0990 Supervision of high risk pregnancy, unspecified, unspecified trimester: Secondary | ICD-10-CM

## 2012-09-12 DIAGNOSIS — O30009 Twin pregnancy, unspecified number of placenta and unspecified number of amniotic sacs, unspecified trimester: Secondary | ICD-10-CM

## 2012-09-12 NOTE — Progress Notes (Signed)
Routine visit. Good FM x 2. Labor precautions reviewed. Keep appt for IOL

## 2012-09-13 ENCOUNTER — Other Ambulatory Visit: Payer: Self-pay | Admitting: Obstetrics & Gynecology

## 2012-09-13 ENCOUNTER — Encounter (HOSPITAL_COMMUNITY): Payer: Self-pay | Admitting: Anesthesiology

## 2012-09-13 ENCOUNTER — Inpatient Hospital Stay (HOSPITAL_COMMUNITY)
Admission: AD | Admit: 2012-09-13 | Discharge: 2012-09-16 | DRG: 765 | Disposition: A | Discharge status: Home / Self Care | Payer: PRIVATE HEALTH INSURANCE | Source: Ambulatory Visit | Attending: Obstetrics & Gynecology | Admitting: Obstetrics & Gynecology

## 2012-09-13 ENCOUNTER — Ambulatory Visit (HOSPITAL_COMMUNITY)
Admission: RE | Admit: 2012-09-13 | Discharge: 2012-09-13 | Disposition: A | Discharge status: Home / Self Care | Payer: PRIVATE HEALTH INSURANCE | Source: Ambulatory Visit | Attending: Obstetrics & Gynecology | Admitting: Obstetrics & Gynecology

## 2012-09-13 ENCOUNTER — Inpatient Hospital Stay (HOSPITAL_COMMUNITY): Payer: PRIVATE HEALTH INSURANCE | Admitting: Anesthesiology

## 2012-09-13 ENCOUNTER — Encounter (HOSPITAL_COMMUNITY): Payer: Self-pay | Admitting: *Deleted

## 2012-09-13 ENCOUNTER — Encounter (HOSPITAL_COMMUNITY)
Admission: AD | Disposition: A | Discharge status: Home / Self Care | Payer: Self-pay | Source: Ambulatory Visit | Attending: Obstetrics & Gynecology

## 2012-09-13 VITALS — BP 122/73 | HR 108 | Wt 165.0 lb

## 2012-09-13 DIAGNOSIS — E0590 Thyrotoxicosis, unspecified without thyrotoxic crisis or storm: Secondary | ICD-10-CM

## 2012-09-13 DIAGNOSIS — O30009 Twin pregnancy, unspecified number of placenta and unspecified number of amniotic sacs, unspecified trimester: Secondary | ICD-10-CM

## 2012-09-13 DIAGNOSIS — E079 Disorder of thyroid, unspecified: Secondary | ICD-10-CM | POA: Insufficient documentation

## 2012-09-13 DIAGNOSIS — O99284 Endocrine, nutritional and metabolic diseases complicating childbirth: Secondary | ICD-10-CM | POA: Diagnosis present

## 2012-09-13 DIAGNOSIS — O365990 Maternal care for other known or suspected poor fetal growth, unspecified trimester, not applicable or unspecified: Secondary | ICD-10-CM | POA: Diagnosis present

## 2012-09-13 DIAGNOSIS — O0990 Supervision of high risk pregnancy, unspecified, unspecified trimester: Secondary | ICD-10-CM

## 2012-09-13 DIAGNOSIS — O35EXX0 Maternal care for other (suspected) fetal abnormality and damage, fetal genitourinary anomalies, not applicable or unspecified: Secondary | ICD-10-CM

## 2012-09-13 DIAGNOSIS — O26899 Other specified pregnancy related conditions, unspecified trimester: Secondary | ICD-10-CM

## 2012-09-13 DIAGNOSIS — O30049 Twin pregnancy, dichorionic/diamniotic, unspecified trimester: Secondary | ICD-10-CM | POA: Insufficient documentation

## 2012-09-13 DIAGNOSIS — Z6791 Unspecified blood type, Rh negative: Secondary | ICD-10-CM

## 2012-09-13 DIAGNOSIS — O358XX0 Maternal care for other (suspected) fetal abnormality and damage, not applicable or unspecified: Secondary | ICD-10-CM

## 2012-09-13 LAB — CBC
Hemoglobin: 12.9 g/dL (ref 12.0–15.0)
Platelets: 208 10*3/uL (ref 150–400)
RBC: 3.94 MIL/uL (ref 3.87–5.11)
WBC: 14 10*3/uL — ABNORMAL HIGH (ref 4.0–10.5)

## 2012-09-13 LAB — TYPE AND SCREEN
ABO/RH(D): O NEG
Antibody Screen: POSITIVE

## 2012-09-13 SURGERY — Surgical Case
Anesthesia: Spinal | Site: Abdomen | Wound class: Clean Contaminated

## 2012-09-13 MED ORDER — SIMETHICONE 80 MG PO CHEW
80.0000 mg | CHEWABLE_TABLET | Freq: Three times a day (TID) | ORAL | Status: DC
  Administered 2012-09-14 – 2012-09-16 (×10): 80 mg via ORAL

## 2012-09-13 MED ORDER — DIPHENHYDRAMINE HCL 50 MG/ML IJ SOLN
12.5000 mg | INTRAMUSCULAR | Status: DC | PRN
  Administered 2012-09-13: 12.5 mg via INTRAVENOUS

## 2012-09-13 MED ORDER — IBUPROFEN 600 MG PO TABS
600.0000 mg | ORAL_TABLET | Freq: Four times a day (QID) | ORAL | Status: DC
  Administered 2012-09-14 – 2012-09-16 (×10): 600 mg via ORAL
  Filled 2012-09-13 (×11): qty 1

## 2012-09-13 MED ORDER — MENTHOL 3 MG MT LOZG: 1.0000 | LOZENGE | OROMUCOSAL | Status: DC | PRN

## 2012-09-13 MED ORDER — PHENYLEPHRINE HCL 10 MG/ML IJ SOLN
INTRAMUSCULAR | Status: DC | PRN
  Administered 2012-09-13: 80 ug via INTRAVENOUS
  Administered 2012-09-13: 40 ug via INTRAVENOUS
  Administered 2012-09-13 (×3): 80 ug via INTRAVENOUS

## 2012-09-13 MED ORDER — METOCLOPRAMIDE HCL 5 MG/ML IJ SOLN: 10.0000 mg | Freq: Three times a day (TID) | INTRAMUSCULAR | Status: DC | PRN

## 2012-09-13 MED ORDER — LACTATED RINGERS IV SOLN: 500.0000 mL | INTRAVENOUS | Status: DC | PRN

## 2012-09-13 MED ORDER — SODIUM CHLORIDE 0.9 % IJ SOLN: 3.0000 mL | INTRAMUSCULAR | Status: DC | PRN

## 2012-09-13 MED ORDER — SCOPOLAMINE 1 MG/3DAYS TD PT72
1.0000 | MEDICATED_PATCH | Freq: Once | TRANSDERMAL | Status: DC
  Administered 2012-09-13: 1.5 mg via TRANSDERMAL

## 2012-09-13 MED ORDER — LACTATED RINGERS IV SOLN
INTRAVENOUS | Status: DC | PRN
  Administered 2012-09-13 (×3): via INTRAVENOUS

## 2012-09-13 MED ORDER — OXYTOCIN 10 UNIT/ML IJ SOLN
INTRAMUSCULAR | Status: AC
  Filled 2012-09-13: qty 4

## 2012-09-13 MED ORDER — MEPERIDINE HCL 25 MG/ML IJ SOLN
6.2500 mg | INTRAMUSCULAR | Status: DC | PRN
  Administered 2012-09-13: 6.25 mg via INTRAVENOUS

## 2012-09-13 MED ORDER — ONDANSETRON HCL 4 MG/2ML IJ SOLN
INTRAMUSCULAR | Status: DC | PRN
  Administered 2012-09-13: 4 mg via INTRAVENOUS

## 2012-09-13 MED ORDER — NALOXONE HCL 0.4 MG/ML IJ SOLN: 0.4000 mg | INTRAMUSCULAR | Status: DC | PRN

## 2012-09-13 MED ORDER — CITRIC ACID-SODIUM CITRATE 334-500 MG/5ML PO SOLN
30.0000 mL | ORAL | Status: DC | PRN
  Administered 2012-09-13: 30 mL via ORAL
  Filled 2012-09-13: qty 15

## 2012-09-13 MED ORDER — ONDANSETRON HCL 4 MG/2ML IJ SOLN: 4.0000 mg | INTRAMUSCULAR | Status: DC | PRN

## 2012-09-13 MED ORDER — DIBUCAINE 1 % RE OINT: 1.0000 "application " | TOPICAL_OINTMENT | RECTAL | Status: DC | PRN

## 2012-09-13 MED ORDER — MORPHINE SULFATE 0.5 MG/ML IJ SOLN
INTRAMUSCULAR | Status: AC
  Filled 2012-09-13: qty 10

## 2012-09-13 MED ORDER — DIPHENHYDRAMINE HCL 25 MG PO CAPS
25.0000 mg | ORAL_CAPSULE | ORAL | Status: DC | PRN
  Filled 2012-09-13: qty 1

## 2012-09-13 MED ORDER — FENTANYL CITRATE 0.05 MG/ML IJ SOLN: 25.0000 ug | INTRAMUSCULAR | Status: DC | PRN

## 2012-09-13 MED ORDER — ACETAMINOPHEN 325 MG PO TABS: 650.0000 mg | ORAL_TABLET | ORAL | Status: DC | PRN

## 2012-09-13 MED ORDER — OXYTOCIN 10 UNIT/ML IJ SOLN
40.0000 [IU] | INTRAVENOUS | Status: DC | PRN
  Administered 2012-09-13: 40 [IU] via INTRAVENOUS

## 2012-09-13 MED ORDER — OXYCODONE-ACETAMINOPHEN 5-325 MG PO TABS: 1.0000 | ORAL_TABLET | ORAL | Status: DC | PRN

## 2012-09-13 MED ORDER — NALBUPHINE HCL 10 MG/ML IJ SOLN: 5.0000 mg | INTRAMUSCULAR | Status: DC | PRN

## 2012-09-13 MED ORDER — ACETAMINOPHEN 10 MG/ML IV SOLN: 1000.0000 mg | Freq: Four times a day (QID) | INTRAVENOUS | Status: AC | PRN

## 2012-09-13 MED ORDER — BUPIVACAINE IN DEXTROSE 0.75-8.25 % IT SOLN
INTRATHECAL | Status: DC | PRN
  Administered 2012-09-13: 1.6 mL via INTRATHECAL

## 2012-09-13 MED ORDER — KETOROLAC TROMETHAMINE 30 MG/ML IJ SOLN
INTRAMUSCULAR | Status: AC
  Filled 2012-09-13: qty 1

## 2012-09-13 MED ORDER — FENTANYL CITRATE 0.05 MG/ML IJ SOLN
INTRAMUSCULAR | Status: DC | PRN
  Administered 2012-09-13: 25 ug via INTRATHECAL

## 2012-09-13 MED ORDER — PROMETHAZINE HCL 25 MG/ML IJ SOLN: 6.2500 mg | INTRAMUSCULAR | Status: DC | PRN

## 2012-09-13 MED ORDER — EPHEDRINE 5 MG/ML INJ
INTRAVENOUS | Status: AC
  Filled 2012-09-13: qty 10

## 2012-09-13 MED ORDER — MIDAZOLAM HCL 2 MG/2ML IJ SOLN: 0.5000 mg | Freq: Once | INTRAMUSCULAR | Status: DC | PRN

## 2012-09-13 MED ORDER — OXYTOCIN BOLUS FROM INFUSION: 500.0000 mL | INTRAVENOUS | Status: DC

## 2012-09-13 MED ORDER — KETOROLAC TROMETHAMINE 30 MG/ML IJ SOLN: 30.0000 mg | Freq: Four times a day (QID) | INTRAMUSCULAR | Status: DC | PRN

## 2012-09-13 MED ORDER — OXYCODONE-ACETAMINOPHEN 5-325 MG PO TABS
1.0000 | ORAL_TABLET | ORAL | Status: DC | PRN
  Administered 2012-09-14: 1 via ORAL
  Administered 2012-09-14: 2 via ORAL
  Administered 2012-09-14 – 2012-09-15 (×2): 1 via ORAL
  Administered 2012-09-15 (×3): 2 via ORAL
  Administered 2012-09-15 – 2012-09-16 (×3): 1 via ORAL
  Filled 2012-09-13: qty 2
  Filled 2012-09-13 (×6): qty 1
  Filled 2012-09-13 (×3): qty 2

## 2012-09-13 MED ORDER — CEFAZOLIN SODIUM-DEXTROSE 2-3 GM-% IV SOLR
INTRAVENOUS | Status: AC
  Filled 2012-09-13: qty 50

## 2012-09-13 MED ORDER — IBUPROFEN 600 MG PO TABS: 600.0000 mg | ORAL_TABLET | Freq: Four times a day (QID) | ORAL | Status: DC | PRN

## 2012-09-13 MED ORDER — TETANUS-DIPHTH-ACELL PERTUSSIS 5-2.5-18.5 LF-MCG/0.5 IM SUSP: 0.5000 mL | Freq: Once | INTRAMUSCULAR | Status: DC

## 2012-09-13 MED ORDER — PNEUMOCOCCAL VAC POLYVALENT 25 MCG/0.5ML IJ INJ: 0.5000 mL | INJECTION | Freq: Once | INTRAMUSCULAR | Status: DC

## 2012-09-13 MED ORDER — MORPHINE SULFATE (PF) 0.5 MG/ML IJ SOLN
INTRAMUSCULAR | Status: DC | PRN
  Administered 2012-09-13: .1 mg via INTRATHECAL

## 2012-09-13 MED ORDER — MEPERIDINE HCL 25 MG/ML IJ SOLN: 6.2500 mg | INTRAMUSCULAR | Status: DC | PRN

## 2012-09-13 MED ORDER — SCOPOLAMINE 1 MG/3DAYS TD PT72
MEDICATED_PATCH | TRANSDERMAL | Status: AC
  Filled 2012-09-13: qty 1

## 2012-09-13 MED ORDER — OXYTOCIN 40 UNITS IN LACTATED RINGERS INFUSION - SIMPLE MED: 62.5000 mL/h | INTRAVENOUS | Status: AC

## 2012-09-13 MED ORDER — LIDOCAINE HCL (PF) 1 % IJ SOLN: 30.0000 mL | INTRAMUSCULAR | Status: DC | PRN

## 2012-09-13 MED ORDER — METHIMAZOLE 5 MG PO TABS
2.5000 mg | ORAL_TABLET | Freq: Every day | ORAL | Status: DC
  Filled 2012-09-13: qty 1

## 2012-09-13 MED ORDER — PHENYLEPHRINE 40 MCG/ML (10ML) SYRINGE FOR IV PUSH (FOR BLOOD PRESSURE SUPPORT)
PREFILLED_SYRINGE | INTRAVENOUS | Status: AC
  Filled 2012-09-13: qty 5

## 2012-09-13 MED ORDER — SIMETHICONE 80 MG PO CHEW: 80.0000 mg | CHEWABLE_TABLET | ORAL | Status: DC | PRN

## 2012-09-13 MED ORDER — ONDANSETRON HCL 4 MG/2ML IJ SOLN: 4.0000 mg | Freq: Three times a day (TID) | INTRAMUSCULAR | Status: DC | PRN

## 2012-09-13 MED ORDER — MEPERIDINE HCL 25 MG/ML IJ SOLN
INTRAMUSCULAR | Status: AC
  Filled 2012-09-13: qty 1

## 2012-09-13 MED ORDER — ONDANSETRON HCL 4 MG/2ML IJ SOLN
INTRAMUSCULAR | Status: AC
  Filled 2012-09-13: qty 2

## 2012-09-13 MED ORDER — WITCH HAZEL-GLYCERIN EX PADS: 1.0000 "application " | MEDICATED_PAD | CUTANEOUS | Status: DC | PRN

## 2012-09-13 MED ORDER — SENNOSIDES-DOCUSATE SODIUM 8.6-50 MG PO TABS
2.0000 | ORAL_TABLET | Freq: Every day | ORAL | Status: DC
  Administered 2012-09-13 – 2012-09-15 (×3): 2 via ORAL

## 2012-09-13 MED ORDER — SODIUM CHLORIDE 0.9 % IV SOLN: 1.0000 ug/kg/h | INTRAVENOUS | Status: DC | PRN

## 2012-09-13 MED ORDER — KETOROLAC TROMETHAMINE 30 MG/ML IJ SOLN
30.0000 mg | Freq: Four times a day (QID) | INTRAMUSCULAR | Status: DC | PRN
  Administered 2012-09-13: 30 mg via INTRAVENOUS

## 2012-09-13 MED ORDER — DIPHENHYDRAMINE HCL 25 MG PO CAPS
25.0000 mg | ORAL_CAPSULE | Freq: Four times a day (QID) | ORAL | Status: DC | PRN
  Administered 2012-09-14: 25 mg via ORAL
  Filled 2012-09-13: qty 1

## 2012-09-13 MED ORDER — NALBUPHINE HCL 10 MG/ML IJ SOLN
5.0000 mg | INTRAMUSCULAR | Status: DC | PRN
Start: 2012-09-13 — End: 2012-09-16

## 2012-09-13 MED ORDER — PRENATAL MULTIVITAMIN CH
1.0000 | ORAL_TABLET | Freq: Every day | ORAL | Status: DC
  Administered 2012-09-14 – 2012-09-16 (×3): 1 via ORAL
  Filled 2012-09-13 (×3): qty 1

## 2012-09-13 MED ORDER — FAMOTIDINE IN NACL 20-0.9 MG/50ML-% IV SOLN
20.0000 mg | Freq: Once | INTRAVENOUS | Status: AC
  Administered 2012-09-13: 20 mg via INTRAVENOUS
  Filled 2012-09-13: qty 50

## 2012-09-13 MED ORDER — LACTATED RINGERS IV SOLN: INTRAVENOUS | Status: DC

## 2012-09-13 MED ORDER — CEFAZOLIN SODIUM-DEXTROSE 2-3 GM-% IV SOLR
INTRAVENOUS | Status: DC | PRN
  Administered 2012-09-13: 2 g via INTRAVENOUS

## 2012-09-13 MED ORDER — ONDANSETRON HCL 4 MG PO TABS: 4.0000 mg | ORAL_TABLET | ORAL | Status: DC | PRN

## 2012-09-13 MED ORDER — OXYTOCIN 40 UNITS IN LACTATED RINGERS INFUSION - SIMPLE MED: 62.5000 mL/h | INTRAVENOUS | Status: DC

## 2012-09-13 MED ORDER — ZOLPIDEM TARTRATE 5 MG PO TABS: 5.0000 mg | ORAL_TABLET | Freq: Every evening | ORAL | Status: DC | PRN

## 2012-09-13 MED ORDER — LANOLIN HYDROUS EX OINT: 1.0000 "application " | TOPICAL_OINTMENT | CUTANEOUS | Status: DC | PRN

## 2012-09-13 MED ORDER — DIPHENHYDRAMINE HCL 50 MG/ML IJ SOLN: 25.0000 mg | INTRAMUSCULAR | Status: DC | PRN

## 2012-09-13 MED ORDER — ONDANSETRON HCL 4 MG/2ML IJ SOLN: 4.0000 mg | Freq: Four times a day (QID) | INTRAMUSCULAR | Status: DC | PRN

## 2012-09-13 MED ORDER — BUPIVACAINE HCL (PF) 0.5 % IJ SOLN
INTRAMUSCULAR | Status: DC | PRN
  Administered 2012-09-13: 30 mL

## 2012-09-13 MED ORDER — FENTANYL CITRATE 0.05 MG/ML IJ SOLN
INTRAMUSCULAR | Status: AC
  Filled 2012-09-13: qty 2

## 2012-09-13 MED ORDER — BUPIVACAINE HCL (PF) 0.5 % IJ SOLN
INTRAMUSCULAR | Status: AC
  Filled 2012-09-13: qty 30

## 2012-09-13 MED ORDER — DIPHENHYDRAMINE HCL 50 MG/ML IJ SOLN
INTRAMUSCULAR | Status: AC
  Filled 2012-09-13: qty 1

## 2012-09-13 MED ORDER — LACTATED RINGERS IV SOLN
INTRAVENOUS | Status: DC
  Administered 2012-09-14: via INTRAVENOUS

## 2012-09-13 SURGICAL SUPPLY — 35 items
APL SKNCLS STERI-STRIP NONHPOA (GAUZE/BANDAGES/DRESSINGS) ×1
BENZOIN TINCTURE PRP APPL 2/3 (GAUZE/BANDAGES/DRESSINGS) ×2 IMPLANT
CLOTH BEACON ORANGE TIMEOUT ST (SAFETY) ×2 IMPLANT
DRAPE PROXIMA HALF (DRAPES) ×1 IMPLANT
DRAPE SURG 17X23 STRL (DRAPES) ×2 IMPLANT
DRESSING TELFA 8X3 (GAUZE/BANDAGES/DRESSINGS) ×3 IMPLANT
DURAPREP 26ML APPLICATOR (WOUND CARE) ×2 IMPLANT
ELECT REM PT RETURN 9FT ADLT (ELECTROSURGICAL) ×2
ELECTRODE REM PT RTRN 9FT ADLT (ELECTROSURGICAL) ×1 IMPLANT
GAUZE SPONGE 4X4 12PLY STRL LF (GAUZE/BANDAGES/DRESSINGS) ×1 IMPLANT
GLOVE BIO SURGEON STRL SZ7 (GLOVE) ×2 IMPLANT
GLOVE BIOGEL PI IND STRL 7.0 (GLOVE) ×2 IMPLANT
GLOVE BIOGEL PI INDICATOR 7.0 (GLOVE) ×2
GOWN STRL REIN XL XLG (GOWN DISPOSABLE) ×4 IMPLANT
KIT ABG SYR 3ML LUER SLIP (SYRINGE) ×2 IMPLANT
NDL HYPO 25X5/8 SAFETYGLIDE (NEEDLE) IMPLANT
NDL SAFETY ECLIPSE 18X1.5 (NEEDLE) IMPLANT
NEEDLE HYPO 18GX1.5 SHARP (NEEDLE) ×2
NEEDLE HYPO 22GX1.5 SAFETY (NEEDLE) ×2 IMPLANT
NEEDLE HYPO 25X5/8 SAFETYGLIDE (NEEDLE) ×4 IMPLANT
NS IRRIG 1000ML POUR BTL (IV SOLUTION) ×2 IMPLANT
PACK C SECTION WH (CUSTOM PROCEDURE TRAY) ×2 IMPLANT
PAD ABD 7.5X8 STRL (GAUZE/BANDAGES/DRESSINGS) ×3 IMPLANT
PAD OB MATERNITY 4.3X12.25 (PERSONAL CARE ITEMS) ×1 IMPLANT
STRIP CLOSURE SKIN 1/2X4 (GAUZE/BANDAGES/DRESSINGS) ×2 IMPLANT
SUT PDS AB 0 CTX 60 (SUTURE) ×1 IMPLANT
SUT VIC AB 0 CT1 36 (SUTURE) ×5 IMPLANT
SUT VIC AB 4-0 KS 27 (SUTURE) ×1 IMPLANT
SYR BULB 3OZ (MISCELLANEOUS) ×1 IMPLANT
SYR CONTROL 10ML LL (SYRINGE) ×2 IMPLANT
SYRINGE 10CC LL (SYRINGE) ×1 IMPLANT
TAPE CLOTH SURG 4X10 WHT LF (GAUZE/BANDAGES/DRESSINGS) ×1 IMPLANT
TOWEL OR 17X24 6PK STRL BLUE (TOWEL DISPOSABLE) ×4 IMPLANT
TRAY FOLEY CATH 14FR (SET/KITS/TRAYS/PACK) ×2 IMPLANT
WATER STERILE IRR 1000ML POUR (IV SOLUTION) ×2 IMPLANT

## 2012-09-13 NOTE — Consult Note (Signed)
Neonatology Note:   Attendance at C-section:    I was asked to attend this primary C/S at 36 3/7 weeks. The mother is a G1P0 O neg, GBS neg with twin gestation and severe IUGR Twin B with intermittent absent doppler flow. The mother has hyperthyroidism, anxiety, and a history HSV-1 infection.  ROM at delivery, fluid clear.  Twin A delivered vertex through clear fluid. Infant vigorous with good spontaneous cry and tone. Needed only minimal bulb suctioning. Ap 9/9. Lungs clear to ausc in DR. To CN to care of Pediatrician.  Twin B was also delivered vertex through clear fluid and had a good cry and tone, but was dusky. He pinked up without supplemental O2. Ap 8/9. He is IUGR and will be transferred to the NICU for further care.   Deatra James, MD

## 2012-09-13 NOTE — Anesthesia Postprocedure Evaluation (Signed)
Anesthesia Post Note  Patient: Kimberly [name]  Procedure(s) Performed: Procedure(s) (LRB): CESAREAN SECTION (N/A)  Anesthesia type: Spinal  Patient location: PACU  Post pain: Pain level controlled  Post assessment: Post-op Vital signs reviewed  Last Vitals:  Filed Vitals:   09/13/12 1915  BP: 112/39  Pulse: 65  Temp:   Resp: 18    Post vital signs: Reviewed  Level of consciousness: awake  Complications: No apparent anesthesia complications

## 2012-09-13 NOTE — Anesthesia Preprocedure Evaluation (Signed)
Anesthesia Evaluation  Patient identified by MRN, date of birth, ID band Patient awake    Reviewed: Allergy & Precautions, H&P , Patient's Chart, lab work & pertinent test results  Airway Mallampati: II TM Distance: >3 FB Neck ROM: full    Dental No notable dental hx.    Pulmonary neg pulmonary ROS,  breath sounds clear to auscultation  Pulmonary exam normal       Cardiovascular negative cardio ROS  Rhythm:regular Rate:Normal     Neuro/Psych negative neurological ROS  negative psych ROS   GI/Hepatic negative GI ROS, Neg liver ROS,   Endo/Other  negative endocrine ROSHyperthyroidism   Renal/GU Renal diseasenegative Renal ROS     Musculoskeletal   Abdominal   Peds  Hematology negative hematology ROS (+)   Anesthesia Other Findings HSV-1 infection     Abnormal Pap smear        Anxiety     Headache        Hyperthyroidism    Reproductive/Obstetrics (+) Pregnancy                           Anesthesia Physical Anesthesia Plan  ASA: II and emergent  Anesthesia Plan: Spinal   Post-op Pain Management:    Induction:   Airway Management Planned:   Additional Equipment:   Intra-op Plan:   Post-operative Plan:   Informed Consent: I have reviewed the patients History and Physical, chart, labs and discussed the procedure including the risks, benefits and alternatives for the proposed anesthesia with the patient or authorized representative who has indicated his/her understanding and acceptance.     Plan Discussed with:   Anesthesia Plan Comments:         Anesthesia Quick Evaluation

## 2012-09-13 NOTE — Anesthesia Preprocedure Evaluation (Signed)
Anesthesia Evaluation  Patient identified by MRN, date of birth, ID band Patient awake    Reviewed: Allergy & Precautions, H&P , Patient's Chart, lab work & pertinent test results  Airway Mallampati: II TM Distance: >3 FB Neck ROM: full    Dental No notable dental hx.    Pulmonary neg pulmonary ROS,  breath sounds clear to auscultation  Pulmonary exam normal       Cardiovascular negative cardio ROS  Rhythm:regular Rate:Normal     Neuro/Psych negative neurological ROS  negative psych ROS   GI/Hepatic negative GI ROS, Neg liver ROS,   Endo/Other  negative endocrine ROSHyperthyroidism   Renal/GU Renal diseasenegative Renal ROS     Musculoskeletal   Abdominal   Peds  Hematology negative hematology ROS (+)   Anesthesia Other Findings HSV-1 infection     Abnormal Pap smear        Anxiety     Headache        Hyperthyroidism    Reproductive/Obstetrics (+) Pregnancy                           Anesthesia Physical Anesthesia Plan  ASA: II and emergent  Anesthesia Plan: Spinal   Post-op Pain Management:    Induction:   Airway Management Planned:   Additional Equipment:   Intra-op Plan:   Post-operative Plan:   Informed Consent: I have reviewed the patients History and Physical, chart, labs and discussed the procedure including the risks, benefits and alternatives for the proposed anesthesia with the patient or authorized representative who has indicated his/her understanding and acceptance.     Plan Discussed with:   Anesthesia Plan Comments:         Anesthesia Quick Evaluation  

## 2012-09-13 NOTE — Progress Notes (Signed)
Kimberly [name]  was seen today for an ultrasound appointment.  See full report in [facility].  Dichorionic/diamniotic twin pregnancy at 36+3 weeks Twin B: severe fetal growth restriction  Normal amniotic fluid volume x 2 Twin A: UA dopplers were normal for this GA  Twin B: UA dopplers were elevated with some intermittently absent diastolic flow BPPs 8/8 x 2  Recommend moving toward delivery now - patient transferred to antepartum for admission The patient had previously planned on induction of labor - although with severe IUGR of twin B and now intermittely absent diastolic flow on UA Doppler studies, may not tolerate labor.  Alpha Gula, MD

## 2012-09-13 NOTE — ED Notes (Signed)
Report called to [facility] charge RN.  Notified of absent dopplers with BPP 8/8 for A&B.  Pt taken to MAU for registration and then to room 172 for admission.

## 2012-09-13 NOTE — H&P (Signed)
Kimberly [name] is a 26 y.o. female presenting for twin gestation, Twin B severe IUGR and intermittent absent doppler flow.. Maternal Medical History:  Reason for admission: Reason for Admission:   nausea  Kimberly [name] is a 26 y.o. G1P0000 at [redacted]w[redacted]d with twin gestation. She is seen at [facility] and has been following with MFM at [facility]. Today, baby B was found to have severe IUGR with intermittent absent Doppler flow. She was sent to L&D for induction or delivery by c-section.  OB History    Grav Para Term Preterm Abortions TAB SAB Ect Mult Living   1 0 0 0 0 0 0 0 0 0      Past Medical History  Diagnosis Date  . HSV-1 infection   . Abnormal Pap smear   . Anxiety   . Headache   . Hyperthyroidism    Past Surgical History  Procedure Date  . Wisdom tooth extraction     x4   Family History: family history includes Alzheimer's disease in her paternal grandmother; COPD in her father; Cancer (age of onset:49) in her maternal grandmother; Cancer (age of onset:60) in her other; Depression in her mother; Diabetes in her father; Hearing loss in her mother; Heart disease in her maternal grandmother and paternal grandfather; Hyperlipidemia in her mother; Nephrolithiasis in her paternal grandfather; Parkinsonism in her maternal grandmother; and Vision loss in her mother. Social History:  reports that she quit smoking about 7 months ago. Her smoking use included Cigarettes. She has a 10 pack-year smoking history. She has never used smokeless tobacco. She reports that she does not drink alcohol or use illicit drugs.   Prenatal Transfer Tool  Maternal Diabetes: No Genetic Screening: Normal Maternal Ultrasounds/Referrals: Abnormal:  Findings:   IUGR Fetal Ultrasounds or other Referrals:  Referred to Materal Fetal Medicine  Maternal Substance Abuse:  No Significant Maternal Medications:  Meds include: Other: Methimazole Significant Maternal Lab Results:  Lab values include: Group  B Strep negative, Rh negative Other Comments:  Twin B with intermittently absent doppler flow  Review of Systems  Constitutional: Negative for fever and chills.  Eyes: Negative for blurred vision and double vision.  Respiratory: Negative for shortness of breath.   Cardiovascular: Negative for chest pain.  Gastrointestinal: Negative for nausea, vomiting, abdominal pain, diarrhea and constipation.  Genitourinary: Negative for dysuria.  Neurological: Negative for dizziness and headaches.      Blood pressure 138/76, pulse 87, temperature 98 F (36.7 C), temperature source Oral, resp. rate 20, height 5' 7.5" (1.715 m), weight 74.844 kg (165 lb), last menstrual period 01/02/2012. Maternal Exam:  Abdomen: Fetal presentation: vertex  Pelvis: questionable for delivery.   Cervix: Cervix evaluated by digital exam.     Fetal Exam Fetal Monitor Review: Twin A 140, moderate variability, accels present, no decels Twin B 135, moderate variability, accels present, no decels     Physical Exam  Constitutional: She is oriented to person, place, and time. She appears well-developed and well-nourished. No distress.  HENT:  Head: Normocephalic and atraumatic.  Eyes: Conjunctivae normal and EOM are normal.  Neck: Normal range of motion. Neck supple.  Cardiovascular: Normal rate.   Respiratory: Effort normal. No respiratory distress.  GI: Soft. There is no tenderness.  Musculoskeletal: Normal range of motion. She exhibits no edema and no tenderness.  Neurological: She is alert and oriented to person, place, and time.  Skin: Skin is warm and dry.  Psychiatric: She has a normal mood and affect.  Cervix:  Closed/thick/high  Ultrasound 09/13/12:  Both babies cephalic. A placenta posterior.  B placenta anterior.  Prenatal labs: ABO, Rh: O/NEG/-- (04/15 1134) Antibody: NEG (04/15 1134) Rubella: 189.4 (04/15 1134) RPR: NON REAC (08/28 1324)  HBsAg: NEGATIVE (04/15 1134)  HIV: NON REACTIVE  (08/28 1324)  GBS: Negative (10/18 0000)   Assessment/Plan: 26 y.o. G1P0000 at [redacted]w[redacted]d with twin gestation, IUGR (Twin B) and absent doppler flow. - Discussed risks and benefits of induction vs c-section - Hyperthyroidism - continue methimazole - Patient opts for c-section  Napoleon Form 09/13/2012, 3:30 PM

## 2012-09-13 NOTE — Anesthesia Procedure Notes (Signed)
Spinal  Patient location during procedure: OR Start time: 09/13/2012 6:05 PM Staffing Anesthesiologist: Brayton Caves R Performed by: anesthesiologist  Preanesthetic Checklist Completed: patient identified, site marked, surgical consent, pre-op evaluation, timeout performed, IV checked, risks and benefits discussed and monitors and equipment checked Spinal Block Patient position: sitting Prep: DuraPrep Patient monitoring: heart rate, cardiac monitor, continuous pulse ox and blood pressure Approach: midline Location: L3-4 Injection technique: single-shot Needle Needle type: Sprotte  Needle gauge: 24 G Needle length: 9 cm Assessment Sensory level: T4 Additional Notes Patient identified.  Risk benefits discussed including failed block, incomplete pain control, headache, nerve damage, paralysis, blood pressure changes, nausea, vomiting, reactions to medication both toxic or allergic, and postpartum back pain.  Patient expressed understanding and wished to proceed.  All questions were answered.  Sterile technique used throughout procedure.  CSF was clear.  No parasthesia or other complications.  Please see nursing notes for vital signs.

## 2012-09-13 NOTE — Op Note (Signed)
Eloisa Northern  PROCEDURE DATE: 09/13/2012   PREOPERATIVE DIAGNOSIS: Twin intrauterine pregnancy at  [redacted]w[redacted]d  weeks gestation; Twin B with severe IUGR  POSTOPERATIVE DIAGNOSIS: The same  PROCEDURE: Primary Low Transverse Cesarean Section  SURGEON:  Dr. Eber Jones L. Harraway-Smith  ASSISTANT:  Napoleon Form, MD   INDICATIONS: Kimberly [name] is a 26 y.o.  G1P0000 at [redacted]w[redacted]d here for cesarean section secondary to the indications listed under preoperative diagnosis; please see preoperative note for further details.  The risks of cesarean section were discussed with the patient including but were not limited to: bleeding which may require transfusion or reoperation; infection which may require antibiotics; injury to bowel, bladder, ureters or other surrounding organs; injury to the fetus; need for additional procedures including hysterectomy in the event of a life-threatening hemorrhage; placental abnormalities wth subsequent pregnancies, incisional problems, thromboembolic phenomenon and other postoperative/anesthesia complications.   The patient concurred with the proposed plan, giving informed written consent for the procedure.    FINDINGS:  Viable female infant (twin A) in cephalic presentation.  Apgars 9 and 9 and Viable Female infant (TWIN B) in breech presentation with APGARs 8 and 9.  Clear amniotic fluid.  Intact placenta, three vessel cord.  Normal uterus, fallopian tubes and ovaries bilaterally.  ANESTHESIA: Spinal INTRAVENOUS FLUIDS: 2600 ml ESTIMATED BLOOD LOSS: 800 ml URINE OUTPUT:  200 ml SPECIMENS: Placenta sent to pathology COMPLICATIONS: None immediate  PROCEDURE IN DETAIL:  The patient preoperatively received intravenous antibiotics and had sequential compression devices applied to her lower extremities.  She was then taken to the operating room where spinal anesthesia was administered and was found to be adequate. She was then placed in a dorsal supine position with a leftward  tilt, and prepped and draped in a sterile manner.  A foley catheter was placed into her bladder and attached to constant gravity.  After an adequate timeout was performed, a Pfannenstiel skin incision was made with scalpel and carried through to the underlying layer of fascia. The fascia was incised in the midline, and this incision was extended bilaterally using the Mayo scissors.  Kocher clamps were applied to the superior aspect of the fascial incision and the underlying rectus muscles were dissected off bluntly. A similar process was carried out on the inferior aspect of the fascial incision. The rectus muscles were separated in the midline bluntly and the peritoneum was entered bluntly. Attention was turned to the lower uterine segment where a low transverse hysterotomy incision was made with a scalpel and extended bilaterally bluntly.  Twin A was successfully delivered, the cord was clamped and cut and the infant was handed over to awaiting neonatology team. Twin B was delivered successfully, the cord was clamped and cut and the infant was handed over to the awaiting neonatology team. Uterine massage was then administered, and the placenta delivered intact with a three-vessel cord. The uterus was exteriorized and cleared of clot and debris.  The hysterotomy was closed with 0 Vicryl in a running locked fashion, and an imbricating layer was also placed with the same suture. The uterus was returned to the pelvis. The pelvis was cleared of all clot and debris. Hemostasis was confirmed on all surfaces.  The peritoneum and the muscles were reapproximated using 0 Vicryl in 1 interrupted suture. The fascia was then closed using 0 Vicryl in a running fashion.  The subcutaneous layer was irrigated, and the skin was closed with a 4-0 Vicryl subcuticular stitch. 30 ml of 0.25% Marcaine was injected into  the subcutaneous tissue around the incision. The patient tolerated the procedure well. Sponge, lap, instrument and  needle counts were correct x 2.  She was taken to the recovery room in stable condition.

## 2012-09-13 NOTE — Transfer of Care (Signed)
Immediate Anesthesia Transfer of Care Note  Patient: Kimberly [name]  Procedure(s) Performed: Procedure(s) (LRB) with comments: CESAREAN SECTION (N/A)  Patient Location: PACU  Anesthesia Type:Spinal  Level of Consciousness: awake, alert  and oriented  Airway & Oxygen Therapy: Patient Spontanous Breathing  Post-op Assessment: Report given to PACU RN and Post -op Vital signs reviewed and stable  Post vital signs: stable  Complications: No apparent anesthesia complications

## 2012-09-14 ENCOUNTER — Encounter (HOSPITAL_COMMUNITY): Payer: Self-pay | Admitting: *Deleted

## 2012-09-14 ENCOUNTER — Ambulatory Visit (HOSPITAL_COMMUNITY): Admission: RE | Admit: 2012-09-14 | Payer: PRIVATE HEALTH INSURANCE | Source: Ambulatory Visit

## 2012-09-14 LAB — CBC
HCT: 32 % — ABNORMAL LOW (ref 36.0–46.0)
MCHC: 34.1 g/dL (ref 30.0–36.0)
Platelets: 165 10*3/uL (ref 150–400)
RDW: 13.6 % (ref 11.5–15.5)
WBC: 13 10*3/uL — ABNORMAL HIGH (ref 4.0–10.5)

## 2012-09-14 LAB — RPR: RPR Ser Ql: NONREACTIVE

## 2012-09-14 MED ORDER — PNEUMOCOCCAL VAC POLYVALENT 25 MCG/0.5ML IJ INJ
0.5000 mL | INJECTION | Freq: Once | INTRAMUSCULAR | Status: AC
  Administered 2012-09-15: 0.5 mL via INTRAMUSCULAR
  Filled 2012-09-14: qty 0.5

## 2012-09-14 NOTE — Anesthesia Postprocedure Evaluation (Signed)
  Anesthesia Post-op Note  Patient: Kimberly [name]  Procedure(s) Performed: * No procedures listed *  Patient Location: Mother/Baby  Anesthesia Type:Spinal  Level of Consciousness: awake  Airway and Oxygen Therapy: Patient Spontanous Breathing  Post-op Pain: none  Post-op Assessment: Patient's Cardiovascular Status Stable, Respiratory Function Stable, Patent Airway, No signs of Nausea or vomiting, Adequate PO intake, Pain level controlled, No headache, No backache, No residual numbness and No residual motor weakness  Post-op Vital Signs: Reviewed and stable  Complications: No apparent anesthesia complications

## 2012-09-14 NOTE — Clinical Social Work Maternal (Signed)
Clinical Social Work Department PSYCHOSOCIAL ASSESSMENT - MATERNAL/CHILD 09/14/2012  Patient:  [name],[name]  Account Number:  [id number]  Admit Date:  09/13/2012  Childs Name:   Kimberly [name] B-Kimberly [name]   Clinical Social Worker:  Ennifer Harston, LCSW   Date/Time:  09/14/2012 02:30 PM  Date Referred:  09/14/2012   Referral source NICU CN    Referred reason NICU Behavioral Health Issues  Other referral source:    I:  FAMILY / HOME ENVIRONMENT Child's legal guardian:  PARENT  Guardian - Name Guardian - Age Guardian - Address Kimberly [name] 26 [address] [place], [place] [address] Kimberly [name]  same  Other household support members/support persons Other support:   FOB has 3 children who spend half of their time at their home and half at their mother's.  They are 19, 17, 13.   II  PSYCHOSOCIAL DATA Information Source:  Family Interview  Financial and Community Resources Employment:   MOB is not working FOB is a dairy Farmer  Financial resources:  Medicaid If Medicaid - County:  [place]  School / Grade:   Maternity Care Coordinator / Child Services Coordination / Early Interventions:  Cultural issues impacting care:   None identified   III  STRENGTHS Strengths Understanding of illness Supportive family/friends Home prepared for Child (including basic supplies) Compliance with medical plan Adequate Resources  Strength comment:    IV  RISK FACTORS AND CURRENT PROBLEMS Current Problem:  None   Risk Factor & Current Problem Patient Issue Family Issue Risk Factor / Current Problem Comment  N N    V  SOCIAL WORK ASSESSMENT CSW met with parents in MOB's first floor room/146 to introduce myself, complete assessment and evaluate how they are coping with twin B's admission to NICU.  Parents were very pleasant and welcomed CSW into the room, although they stated that they thought visitors were coming soon and they might not have much  time to talk.  Therefore, the assessment felt somewhat rushed.  They report having good supports.  MOB stated that other than her husband, her mother is her greatest support person.  MOB states that they have a lot of supportive family in the area who live within 15 minutes from them.  They report having everything they need for the twins at home.  MOB does not forsee any issues with transportation to visit baby in NICU after her discharge.  CSW informed them of the possibility of Kimberly [name] or gas cards since [place] out of [place].  They were appreciative and said they would let CSW know.  CSW explained support services offered by NICU SW and gave contact infromation.  CSW informed them of supports provided by [facility] [employer] as well.  CSW discussed PPD signs and symptoms.  There is a hx of Anxiety noted in PNR.  MOB states no concerns about anxiety or depression at this time and states she feels comfortable talking with her doctor if symptoms arise.  She appears to be coping well and acknowledges the stress of the situation, but does not appear anxious during assessment.  FOB was very soft spoken.  They were married last year and seem to be excited about the babies.  They report no questions or concerns about what to expect from their son's NICU admission.  CSW explained baby's eligibility for SSI and assisted parents in completing the application.  CSW will submit once copy of the Mother's Verification of Facts is obtained from the Birth Registrar   next week.  CSW has no social concerns at this time.     VI SOCIAL WORK PLAN Social Work Plan Psychosocial Support/Ongoing Assessment of Needs  Type of pt/family education:   SSI PPD  If child protective services report - county:   If child protective services report - date:   Information/referral to community resources comment:   No needs identified at this time.  Other social work plan:    

## 2012-09-14 NOTE — Progress Notes (Signed)
Post Operative Day #1 from primary c-section for twin gestation at [redacted]w[redacted]d  Subjective: no complaints, tolerating PO and + flatus Patient still has foley in place but has been out of bed. Pain in abdomen controlled with medication. Breast feeding twin in room, plans to breastfeed twin in NICU later this morning.   Objective: Blood pressure 92/60, pulse 79, temperature 98.3 F (36.8 C), temperature source Oral, resp. rate 16, height 5' 7.5" (1.715 m), weight 74.844 kg (165 lb), last menstrual period 01/02/2012, SpO2 98.00%, unknown if currently breastfeeding.  Physical Exam:  General: alert, cooperative and no distress Lochia: appropriate Uterine Fundus: firm Incision: no significant drainage, no significant erythema, dressing in place DVT Evaluation: No evidence of DVT seen on physical exam.   Basename 09/14/12 0553 09/13/12 1258  HGB 10.9* 12.9  HCT 32.0* 37.5    Assessment/Plan: Plan for discharge tomorrow, Breastfeeding, Lactation consult and Contraception Mirena   LOS: 1 day   HAIRFORD, AMBER 09/14/2012, 7:44 AM

## 2012-09-14 NOTE — Plan of Care (Signed)
Problem: Phase II Progression Outcomes Goal: Rh isoimmunization per orders Outcome: Progressing Order put in for [facility] studies at 2300 09/14/12

## 2012-09-15 MED ORDER — RHO D IMMUNE GLOBULIN 1500 UNIT/2ML IJ SOLN
300.0000 ug | Freq: Once | INTRAMUSCULAR | Status: AC
  Administered 2012-09-15: 300 ug via INTRAMUSCULAR
  Filled 2012-09-15: qty 2

## 2012-09-15 NOTE — Progress Notes (Signed)
I saw and examined patient and agree with resident note. Napoleon Form, MD

## 2012-09-15 NOTE — Progress Notes (Signed)
Subjective: Postpartum Day #2: Cesarean Delivery- twin males Patient reports tolerating PO and no problems voiding; ambulating without difficulty; breastfeeding/pumping for NICU infant; desires Mirena for contraception  Objective: Vital signs in last 24 hours: Temp:  [97.7 F (36.5 C)-98.4 F (36.9 C)] 97.9 F (36.6 C) (11/09 0516) Pulse Rate:  [64-106] 84  (11/09 0516) Resp:  [16-18] 18  (11/09 0516) BP: (92-151)/(56-78) 106/69 mmHg (11/09 0516) SpO2:  [97 %-98 %] 98 % (11/09 0516)  Physical Exam:  General: alert, cooperative and no distress Lochia: appropriate Uterine Fundus: firm Incision: dressing present, clean, dry, intact DVT Evaluation: No evidence of DVT seen on physical exam.   Basename 09/14/12 0553 09/13/12 1258  HGB 10.9* 12.9  HCT 32.0* 37.5    Assessment/Plan: Status post Cesarean section. Doing well postoperatively.  Continue current care. Would like to stay until tomorrow for ease of being with NICU infant.  Kimberly [name] 09/15/2012, 6:57 AM

## 2012-09-16 ENCOUNTER — Encounter (HOSPITAL_COMMUNITY)
Admission: RE | Admit: 2012-09-16 | Discharge: 2012-09-16 | Disposition: A | Discharge status: Home / Self Care | Payer: PRIVATE HEALTH INSURANCE | Source: Ambulatory Visit | Attending: Obstetrics & Gynecology | Admitting: Obstetrics & Gynecology

## 2012-09-16 DIAGNOSIS — O923 Agalactia: Secondary | ICD-10-CM | POA: Insufficient documentation

## 2012-09-16 LAB — RH IG WORKUP (INCLUDES ABO/RH): Fetal Screen: NEGATIVE

## 2012-09-16 MED ORDER — IBUPROFEN 600 MG PO TABS: 600.0000 mg | ORAL_TABLET | Freq: Four times a day (QID) | ORAL | Status: DC

## 2012-09-16 MED ORDER — OXYCODONE-ACETAMINOPHEN 5-325 MG PO TABS: 1.0000 | ORAL_TABLET | ORAL | Status: DC | PRN

## 2012-09-16 NOTE — Discharge Summary (Signed)
Obstetric Discharge Summary Reason for Admission: cesarean section after baby B was found to have severe IUGR with intermittent absent Doppler flow at 36 week ultrasound Prenatal Procedures: NST and ultrasound Intrapartum Procedures: cesarean: low cervical, transverse Postpartum Procedures: none Complications-Operative and Postpartum: none Hemoglobin  Date Value Range Status  09/14/2012 10.9* 12.0 - 15.0 g/dL Final     HCT  Date Value Range Status  09/14/2012 32.0* 36.0 - 46.0 % Final   Physical Exam:  General: alert, cooperative and no distress Lochia: appropriate Uterine Fundus: firm Incision: healing well, no significant drainage, no dehiscence, no significant erythema DVT Evaluation: No evidence of DVT seen on physical exam.  Discharge Diagnoses: Pre-term pregnancy delivered via c-section  Discharge Information: Date: 09/16/2012 Activity: pelvic rest Diet: routine Medications: Ibuprofen and Percocet Condition: stable Instructions: refer to practice specific booklet Discharge to: home. Will follow up with [facility]. Plans for Mirena for contraception. Breastfeeding/pumping.   Newborn Data:   Lillia Corporal [[id number]]  Live born female  Birth Weight: 5 lb 4.3 oz (2390 g) APGAR: 9, 9  Home with mother. Circumcision as inpatient planned prior to discharge   Ellender Hose [[id number]]  Live born female  Birth Weight: 3 lb 6.7 oz (1551 g) APGAR: 8, 9  Currently in NICU at time of discharge.  Saori Umholtz 09/16/2012, 7:19 AM

## 2012-09-17 ENCOUNTER — Inpatient Hospital Stay (HOSPITAL_COMMUNITY): Admission: RE | Admit: 2012-09-17 | Payer: PRIVATE HEALTH INSURANCE | Source: Ambulatory Visit

## 2012-09-24 NOTE — Op Note (Signed)
I was present and scrubbed for the entire procedure.  No complications noted. Imanie Darrow L. Harraway-Smith, M.D., Evern Core

## 2012-10-09 ENCOUNTER — Ambulatory Visit (INDEPENDENT_AMBULATORY_CARE_PROVIDER_SITE_OTHER): Payer: PRIVATE HEALTH INSURANCE | Admitting: Family Medicine

## 2012-10-09 ENCOUNTER — Encounter: Payer: Self-pay | Admitting: Family Medicine

## 2012-10-09 VITALS — BP 140/91 | HR 77 | Ht 67.5 in | Wt 139.0 lb

## 2012-10-09 DIAGNOSIS — E039 Hypothyroidism, unspecified: Secondary | ICD-10-CM

## 2012-10-09 DIAGNOSIS — F53 Postpartum depression: Secondary | ICD-10-CM | POA: Insufficient documentation

## 2012-10-09 DIAGNOSIS — O925 Suppressed lactation: Secondary | ICD-10-CM

## 2012-10-09 DIAGNOSIS — O99345 Other mental disorders complicating the puerperium: Secondary | ICD-10-CM

## 2012-10-09 DIAGNOSIS — F3289 Other specified depressive episodes: Secondary | ICD-10-CM

## 2012-10-09 DIAGNOSIS — F329 Major depressive disorder, single episode, unspecified: Secondary | ICD-10-CM

## 2012-10-09 MED ORDER — ESCITALOPRAM OXALATE 10 MG PO TABS: 10.0000 mg | ORAL_TABLET | Freq: Every day | ORAL | Status: DC

## 2012-10-09 NOTE — Assessment & Plan Note (Signed)
Have started Lexapro.  She has a f/u in 2 wks.  She reports good luck with Cymbalta in the past, but I am unsure about the safety of this with nursing.

## 2012-10-09 NOTE — Progress Notes (Signed)
  Subjective:    Patient ID: Kimberly [name], female    DOB: [birth year], 26 y.o.   MRN: [id number]  HPI Here today 4 wks after Primary LTCS for Twins with IUGR of baby B.  Reports feeling sad that both babies could not come home with her.  She reports then becoming overwhelmed with care for twins.  She is having trouble maintaining milk supply for two babies.  She has started Fenugreek.  She reports feeling sad, tearful, overwhelmed.  She reports h/o depression.  She denies suicidal thoughts or thoughts of harming the babies.  Her pp Depression score is 19.    Review of Systems  Constitutional: Positive for appetite change and fatigue.  Psychiatric/Behavioral: Positive for sleep disturbance and dysphoric mood. Negative for suicidal ideas, hallucinations and self-injury. The patient is nervous/anxious.        Objective:   Physical Exam  Vitals reviewed. Constitutional: She appears well-developed and well-nourished.  Psychiatric: Judgment and thought content normal. Her affect is blunt. Her speech is not rapid and/or pressured and not tangential. She is not agitated, not aggressive and not actively hallucinating. Cognition and memory are normal. She exhibits a depressed mood. She expresses no suicidal plans and no homicidal plans. She is attentive.          Assessment & Plan:

## 2012-10-09 NOTE — Assessment & Plan Note (Signed)
Change to non-[employer] brand of Fenugreek--titrate to smell of maple syrup in urine.  Increase pumping to 10 mins, following nursing of one baby.

## 2012-10-09 NOTE — Patient Instructions (Addendum)

## 2012-10-10 LAB — TSH: TSH: 0.509 u[IU]/mL (ref 0.350–4.500)

## 2012-10-11 ENCOUNTER — Ambulatory Visit: Payer: PRIVATE HEALTH INSURANCE | Admitting: Obstetrics & Gynecology

## 2012-10-16 ENCOUNTER — Encounter (HOSPITAL_COMMUNITY): Admission: RE | Admit: 2012-10-16 | Payer: Medicaid Other | Source: Ambulatory Visit

## 2012-10-16 ENCOUNTER — Encounter (HOSPITAL_COMMUNITY)
Admission: RE | Admit: 2012-10-16 | Discharge: 2012-10-16 | Disposition: A | Discharge status: Home / Self Care | Payer: Medicaid Other | Source: Ambulatory Visit | Attending: Obstetrics & Gynecology | Admitting: Obstetrics & Gynecology

## 2012-10-16 DIAGNOSIS — O923 Agalactia: Secondary | ICD-10-CM | POA: Insufficient documentation

## 2012-10-24 ENCOUNTER — Ambulatory Visit (INDEPENDENT_AMBULATORY_CARE_PROVIDER_SITE_OTHER): Payer: Medicaid Other | Admitting: Obstetrics & Gynecology

## 2012-10-24 VITALS — BP 129/90 | HR 80 | Ht 67.0 in | Wt 139.0 lb

## 2012-10-24 DIAGNOSIS — Z975 Presence of (intrauterine) contraceptive device: Secondary | ICD-10-CM

## 2012-10-24 DIAGNOSIS — Z01812 Encounter for preprocedural laboratory examination: Secondary | ICD-10-CM

## 2012-10-24 DIAGNOSIS — Z30430 Encounter for insertion of intrauterine contraceptive device: Secondary | ICD-10-CM

## 2012-10-24 MED ORDER — LEVONORGESTREL 20 MCG/24HR IU IUD: INTRAUTERINE_SYSTEM | Freq: Once | INTRAUTERINE | Status: DC

## 2012-10-24 NOTE — Patient Instructions (Signed)
Intrauterine Device Insertion Care After Refer to this sheet in the next few weeks. These instructions provide you with information on caring for yourself after your procedure. Your caregiver may also give you more specific instructions. Your treatment has been planned according to current medical practices, but problems sometimes occur. Call your caregiver if you have any problems or questions after your procedure. HOME CARE INSTRUCTIONS   Only take over-the-counter or prescription medicines for pain, discomfort, or fever as directed by your caregiver. Do not use aspirin. This may increase bleeding.  Check your IUD to make sure it is in place before you resume sexual activity. You should be able to feel the strings. If you cannot feel the strings, something may be wrong. The IUD may have fallen out of the uterus, or the uterus may have been punctured (perforated) during placement. Also, if the strings are getting longer, it may mean that the IUD is being forced out of the uterus. You no longer have full protection from pregnancy if any of these problems occur.  You may resume sexual intercourse if you are not having problems with the IUD. The IUD is considered immediately effective.  You may resume normal activities.  Keep all follow-up appointments to be sure your IUD has remained in place. After the first exam, yearly exams are advised, unless you cannot feel the strings of your IUD.  Continue to check that the IUD is still in place by feeling for the strings after every menstrual period. SEEK MEDICAL CARE IF:   You have bleeding that is heavier or lasts longer than a normal menstrual cycle.  You have a fever.  You have increasing cramps or abdominal pain not relieved with medicine.  You have abdominal pain that does not seem to be related to the same area of earlier cramping and pain.  You are lightheaded, unusually weak, or faint.  You have abnormal vaginal discharge or  smells.  You have pain during sexual intercourse.  You cannot feel the IUD strings, or the IUD string has gotten longer.  You feel the IUD at the opening of the cervix in the vagina.  You think you are pregnant, or you miss your menstrual period.  The IUD string is hurting your sex partner. Document Released: 06/22/2011 Document Revised: 01/16/2012 Document Reviewed: 06/22/2011 Exit[employer] Patient Information 2013 [employer].  

## 2012-10-24 NOTE — Progress Notes (Signed)
TABOR BARTRAM is a 26 y.o. [phone] here for Mirena IUD insertion.  No GYN concerns.  Last pap smear was on 02/29/12 and was normal.  IUD Procedure Note Patient identified, informed consent performed.  Discussed risks of irregular bleeding, cramping, infection, malpositioning or misplacement of the IUD outside the uterus which may require further procedures. Time out was performed.  Urine pregnancy test negative.  Speculum placed in the vagina.  Cervix visualized.  Cleaned with Betadine x 2.  Grasped anteriorly with a single tooth tenaculum.  Uterus sounded to 10 cm.  Mirena IUD placed per manufacturer's recommendations.  Strings trimmed to 3 cm. Tenaculum was removed, good hemostasis noted.  Patient tolerated procedure well.   Patient was given post-procedure instructions.  Patient was also asked to check IUD strings periodically and follow up in 4 weeks for IUD check.

## 2012-11-14 ENCOUNTER — Ambulatory Visit (INDEPENDENT_AMBULATORY_CARE_PROVIDER_SITE_OTHER): Payer: Medicaid Other | Admitting: Obstetrics & Gynecology

## 2012-11-14 ENCOUNTER — Encounter: Payer: Self-pay | Admitting: Obstetrics & Gynecology

## 2012-11-14 VITALS — BP 128/85 | HR 108 | Ht 67.0 in | Wt 140.0 lb

## 2012-11-14 DIAGNOSIS — R350 Frequency of micturition: Secondary | ICD-10-CM

## 2012-11-14 DIAGNOSIS — N921 Excessive and frequent menstruation with irregular cycle: Secondary | ICD-10-CM

## 2012-11-14 DIAGNOSIS — Z975 Presence of (intrauterine) contraceptive device: Secondary | ICD-10-CM

## 2012-11-14 DIAGNOSIS — N393 Stress incontinence (female) (male): Secondary | ICD-10-CM

## 2012-11-14 DIAGNOSIS — Z30431 Encounter for routine checking of intrauterine contraceptive device: Secondary | ICD-10-CM

## 2012-11-14 MED ORDER — DICLOFENAC SODIUM 75 MG PO TBEC: 75.0000 mg | DELAYED_RELEASE_TABLET | Freq: Two times a day (BID) | ORAL | Status: DC

## 2012-11-14 MED ORDER — NORGESTIMATE-ETH ESTRADIOL 0.25-35 MG-MCG PO TABS: 1.0000 | ORAL_TABLET | Freq: Every day | ORAL | Status: DC

## 2012-11-14 NOTE — Progress Notes (Signed)
History:  27 y.o. [id number] here today for Mirena IUD check. She had it placed on 10/24/12 and reports having heavy irregular bleeding (changes pad every 3 hours) and cramping since placement.  She wants to know what she can do about these symptoms. Also reports increased urinary frequency and incontinence since delivery, wants to know if this is normal. No other concerns.  The following portions of the patient's history were reviewed and updated as appropriate: allergies, current medications, past family history, past medical history, past social history, past surgical history and problem list.  Review of Systems:  Pertinent items are noted in HPI.  Objective:  Physical Exam BP 128/85  Pulse 108  Ht 5\' 7"  (1.702 m)  Wt 140 lb (63.504 kg)  BMI 21.93 kg/m2 Gen: NAD Abd: Soft, nontender and nondistended Pelvic: Normal appearing external genitalia; normal appearing vaginal mucosa and cervix.  Bloody discharge seen; IUD strings visualized about 3 cm in length.  Small uterus, no other palpable masses, no uterine or adnexal tenderness  Labs and Imaging Urine dipstick negative for infection  Assessment & Plan:  Patient was assured that her symptoms are normal after IUD placement; they can last for the first few months.  To help with her bleeding and pain, one pack of Sprintec was ordered for her and Diclofenac.  If symptoms worsen, she was told to call back for further evaluation, she may need pelvic ultrasound to ensure correct IUD placement.  Bleeding and pain precautions reviewed.  As for her incontinence, she was told that this is stress incontinence which is common after pregnancy, especially given that she had twins.  She was instructed to do Kegel exercises, will continue to monitor.

## 2012-11-14 NOTE — Patient Instructions (Signed)
Return to clinic for any scheduled appointments or for any gynecologic concerns as needed.   

## 2013-02-11 ENCOUNTER — Telehealth: Payer: Self-pay | Admitting: Internal Medicine

## 2013-02-11 NOTE — Telephone Encounter (Signed)
Patient has been having reflux and diarrhea x several weeks. Scheduled with Dr. Juanda Chance on 02/12/13 at 1:30 PM.

## 2013-02-12 ENCOUNTER — Ambulatory Visit (INDEPENDENT_AMBULATORY_CARE_PROVIDER_SITE_OTHER): Payer: BC Managed Care – PPO | Admitting: Internal Medicine

## 2013-02-12 ENCOUNTER — Encounter: Payer: Self-pay | Admitting: Internal Medicine

## 2013-02-12 ENCOUNTER — Ambulatory Visit (HOSPITAL_COMMUNITY)
Admission: RE | Admit: 2013-02-12 | Discharge: 2013-02-12 | Disposition: A | Discharge status: Home / Self Care | Payer: BC Managed Care – PPO | Source: Ambulatory Visit | Attending: Internal Medicine | Admitting: Internal Medicine

## 2013-02-12 VITALS — BP 102/70 | HR 108 | Ht 67.0 in | Wt 145.0 lb

## 2013-02-12 DIAGNOSIS — K3189 Other diseases of stomach and duodenum: Secondary | ICD-10-CM | POA: Insufficient documentation

## 2013-02-12 DIAGNOSIS — R1011 Right upper quadrant pain: Secondary | ICD-10-CM | POA: Insufficient documentation

## 2013-02-12 DIAGNOSIS — R1013 Epigastric pain: Secondary | ICD-10-CM

## 2013-02-12 DIAGNOSIS — R109 Unspecified abdominal pain: Secondary | ICD-10-CM

## 2013-02-12 NOTE — Progress Notes (Signed)
Kimberly [name] [birth year] MRN [id number]   History of Present Illness:  This is a 27 year old white female who delivered twins by C-section 5 months ago and has developed food intolerance, dyspepsia and heartburn. Kimberly [name] who  is our patient  had a cholecystectomy at age 82. Patient has severe crampy abdominal pain after she eats with occasional diarrhea. She is not breast feeding. Kimberly weight is almost back to Kimberly baseline. She has occasional nausea and vomiting for which she takes [place]. I saw Kimberly [name] about 10 years ago for irritable bowel syndrome. Those records are not available. She had an upper endoscopy at that time but we don't have the report of it. Kimberly most recent hemoglobin was 10.9 and hematocrit was 32.0. A CT scan of the abdomen in September 2005 for evaluation of abdominal pain was negative. Kimberly [name] has Behcet's syndrome.Pt has been taking NSAID's on regular basis till recently.   Past Medical History  Diagnosis Date  . HSV-1 infection   . Abnormal Pap smear   . Anxiety   . Headache   . Hyperthyroidism   . IBS (irritable bowel syndrome)    Past Surgical History  Procedure Laterality Date  . Wisdom tooth extraction      x4  . Cesarean section  09/13/2012    Procedure: CESAREAN SECTION;  Surgeon: Willodean Rosenthal, MD;  Location: [facility];  Service: Obstetrics;  Laterality: N/A;    reports that she quit smoking about a year ago. Kimberly smoking use included Cigarettes. She has a 10 pack-year smoking history. She has never used smokeless tobacco. She reports that she does not drink alcohol or use illicit drugs. family history includes Alzheimer's disease in Kimberly paternal grandmother; COPD in Kimberly [name]; Cancer (age of onset: 82) in Kimberly maternal grandmother; Cancer (age of onset: 69) in Kimberly other; Depression in Kimberly [name]; Diabetes in Kimberly [name]; Hearing loss in Kimberly [name]; Heart disease in Kimberly maternal grandmother and paternal grandfather; Hyperlipidemia in Kimberly [name];  Nephrolithiasis in Kimberly paternal grandfather; Parkinsonism in Kimberly maternal grandmother; and Vision loss in Kimberly [name]. No Known Allergies      Review of Systems: Positive for heartburn indigestion, abdominal pain radiates to shoulder blade  The remainder of the 10 point ROS is negative except as outlined in H&P   Physical Exam: General appearance  Well developed, in no distress. Eyes- non icteric. HEENT nontraumatic, normocephalic. Mouth no lesions, tongue papillated, no cheilosis. Neck supple without adenopathy, thyroid not enlarged, no carotid bruits, no JVD. Lungs Clear to auscultation bilaterally. Cor normal S1, normal S2, regular rhythm, no murmur,  quiet precordium. Abdomen: Very tender across upper abdomen. Normal active bowel sounds. No hernia. Liver edge at costal margin. Rectal: Not done. Extremities no pedal edema. Skin no lesions. Neurological alert and oriented x 3. Psychological normal mood and affect.  Assessment and Plan:  Problem #59 27 year old white female who is 5 months post partum now with symptoms of dyspepsia and abdominal tenderness associated with gastroesophageal reflux and irregular bowel habits. There is a history of irritable bowel syndrome. There is also a family history of gallbladder disease. We will obtain an upper abdominal ultrasound to rule out symptomatic gallbladder disease. I have given Kimberly samples of Prilosec 20 mg daily and we will also send a prescription of this. She will be scheduled for an upper endoscopy to rule out NSAID gastropathy or peptic ulcer disease since she has been taking anti-inflammatory agents for abdominal pain. We will check Kimberly for  H. Pylori at that time.   02/12/2013 Lina Sar

## 2013-02-12 NOTE — Patient Instructions (Addendum)
You have been scheduled for an endoscopy with propofol. Please follow written instructions given to you at your visit today. If you use inhalers (even only as needed), please bring them with you on the day of your procedure.  You have been scheduled for an abdominal ultrasound at [facility] (1st floor of hospital) TODAY. Make certain not to have anything to eat or drink 6 hours prior to your appointment. Should you need to reschedule your appointment, please contact radiology at [phone]. This test typically takes about 30 minutes to perform.  CC: Dr Lynnea Ferrier

## 2013-02-13 ENCOUNTER — Ambulatory Visit (AMBULATORY_SURGERY_CENTER): Payer: BC Managed Care – PPO | Admitting: Internal Medicine

## 2013-02-13 ENCOUNTER — Encounter: Payer: Self-pay | Admitting: Internal Medicine

## 2013-02-13 VITALS — BP 102/70 | HR 89 | Temp 96.1°F | Resp 17 | Ht 67.0 in | Wt 145.0 lb

## 2013-02-13 DIAGNOSIS — K3189 Other diseases of stomach and duodenum: Secondary | ICD-10-CM

## 2013-02-13 DIAGNOSIS — D131 Benign neoplasm of stomach: Secondary | ICD-10-CM

## 2013-02-13 DIAGNOSIS — R109 Unspecified abdominal pain: Secondary | ICD-10-CM

## 2013-02-13 DIAGNOSIS — R1013 Epigastric pain: Secondary | ICD-10-CM

## 2013-02-13 MED ORDER — SODIUM CHLORIDE 0.9 % IV SOLN: 500.0000 mL | INTRAVENOUS | Status: DC

## 2013-02-13 MED ORDER — BELLADONNA ALK-PHENOBARBITAL 16.2 MG PO TABS: 1.0000 | ORAL_TABLET | ORAL | Status: DC | PRN

## 2013-02-13 MED ORDER — OMEPRAZOLE 20 MG PO CPDR: 20.0000 mg | DELAYED_RELEASE_CAPSULE | Freq: Every day | ORAL | Status: DC

## 2013-02-13 MED ORDER — OMEPRAZOLE 40 MG PO CPDR: 40.0000 mg | DELAYED_RELEASE_CAPSULE | Freq: Every day | ORAL | Status: DC

## 2013-02-13 NOTE — Op Note (Signed)
[facility] [address] [place], [address]   ENDOSCOPY PROCEDURE REPORT  PATIENT: Kimberly, [name]  MR#: [id number] BIRTHDATE: 1986-05-04 , 26  yrs. old GENDER: Female ENDOSCOPIST: Hart Carwin, MD REFERRED BY:  Lynnea Ferrier, M.D. PROCEDURE DATE:  02/13/2013 PROCEDURE:  EGD w/ biopsy ASA CLASS:     Class I INDICATIONS:  Dyspepsia.   normal upper abd.  ultrasound, positive F hx of gall bladder disease. MEDICATIONS: MAC sedation, administered by CRNA and propofol (Diprivan) 200mg  IV TOPICAL ANESTHETIC: Cetacaine Spray  DESCRIPTION OF PROCEDURE: After the risks benefits and alternatives of the procedure were thoroughly explained, informed consent was obtained.  The [facility] [id number]-[id number] [id number] endoscope was introduced through the mouth and advanced to the second portion of the duodenum. Without limitations.  The instrument was slowly withdrawn as the mucosa was fully examined.      The upper, middle and distal third of the esophagus were carefully inspected and no abnormalities were noted.  The z-line was well seen at the GEJ.  The endoscope was pushed into the fundus which was normal including a retroflexed view.  The antrum, gastric body, first and second part of the duodenum were unremarkable.  A biopsy was performed.  Retroflexed views revealed no abnormalities. The scope was then withdrawn from the patient and the procedure completed.  COMPLICATIONS: There were no complications. ENDOSCOPIC IMPRESSION: Normal EGD; biopsy nothing to account for crampy abdominal pain,  ? IBS  RECOMMENDATIONS: 1.  Await pathology results 2.  Continue PPI 3. trial of Donnatal 1 po bid for antispasm effect 4. consider HIDA scan if symptoms continue  REPEAT EXAM: for EGD pending biopsy results.  eSigned:  Hart Carwin, MD 02/13/2013 2:36 PM   CC:  PATIENT NAME:  Kimberly, [name] MR#: [id number]

## 2013-02-13 NOTE — Progress Notes (Signed)
Called to room to assist during endoscopic procedure.  Patient ID and intended procedure confirmed with present staff. Received instructions for my participation in the procedure from the performing physician.  

## 2013-02-13 NOTE — Patient Instructions (Addendum)
Discharge instructions given with verbal understanding. Biopsies taken. Resume previous medications. YOU HAD AN ENDOSCOPIC PROCEDURE TODAY AT THE [facility]: Refer to the procedure report that was given to you for any specific questions about what was found during the examination.  If the procedure report does not answer your questions, please call your gastroenterologist to clarify.  If you requested that your care partner not be given the details of your procedure findings, then the procedure report has been included in a sealed envelope for you to review at your convenience later.  YOU SHOULD EXPECT: Some feelings of bloating in the abdomen. Passage of more gas than usual.  Walking can help get rid of the air that was put into your GI tract during the procedure and reduce the bloating. If you had a lower endoscopy (such as a colonoscopy or flexible sigmoidoscopy) you may notice spotting of blood in your stool or on the toilet paper. If you underwent a bowel prep for your procedure, then you may not have a normal bowel movement for a few days.  DIET: Your first meal following the procedure should be a light meal and then it is ok to progress to your normal diet.  A half-sandwich or bowl of soup is an example of a good first meal.  Heavy or fried foods are harder to digest and may make you feel nauseous or bloated.  Likewise meals heavy in dairy and vegetables can cause extra gas to form and this can also increase the bloating.  Drink plenty of fluids but you should avoid alcoholic beverages for 24 hours.  ACTIVITY: Your care partner should take you home directly after the procedure.  You should plan to take it easy, moving slowly for the rest of the day.  You can resume normal activity the day after the procedure however you should NOT DRIVE or use heavy machinery for 24 hours (because of the sedation medicines used during the test).    SYMPTOMS TO REPORT IMMEDIATELY: A gastroenterologist  can be reached at any hour.  During normal business hours, 8:30 AM to 5:00 PM Monday through Friday, call [phone].  After hours and on weekends, please call the GI answering service at [phone] who will take a message and have the physician on call contact you.   Following upper endoscopy (EGD)  Vomiting of blood or coffee ground material  New chest pain or pain under the shoulder blades  Painful or persistently difficult swallowing  New shortness of breath  Fever of 100F or higher  Black, tarry-looking stools  FOLLOW UP: If any biopsies were taken you will be contacted by phone or by letter within the next 1-3 weeks.  Call your gastroenterologist if you have not heard about the biopsies in 3 weeks.  Our staff will call the home number listed on your records the next business day following your procedure to check on you and address any questions or concerns that you may have at that time regarding the information given to you following your procedure. This is a courtesy call and so if there is no answer at the home number and we have not heard from you through the emergency physician on call, we will assume that you have returned to your regular daily activities without incident.  SIGNATURES/CONFIDENTIALITY: You and/or your care partner have signed paperwork which will be entered into your electronic medical record.  These signatures attest to the fact that that the information above on your After   Visit Summary has been reviewed and is understood.  Full responsibility of the confidentiality of this discharge information lies with you and/or your care-partner. 

## 2013-02-14 ENCOUNTER — Telehealth: Payer: Self-pay

## 2013-02-14 NOTE — Telephone Encounter (Signed)
Left message on answering machine. 

## 2013-02-19 ENCOUNTER — Encounter: Payer: Self-pay | Admitting: Internal Medicine

## 2013-03-26 ENCOUNTER — Encounter: Payer: Self-pay | Admitting: Obstetrics & Gynecology

## 2013-03-26 ENCOUNTER — Ambulatory Visit (INDEPENDENT_AMBULATORY_CARE_PROVIDER_SITE_OTHER): Payer: BC Managed Care – PPO | Admitting: Obstetrics & Gynecology

## 2013-03-26 VITALS — BP 121/90 | HR 107 | Ht 67.0 in | Wt 153.0 lb

## 2013-03-26 DIAGNOSIS — Z124 Encounter for screening for malignant neoplasm of cervix: Secondary | ICD-10-CM

## 2013-03-26 DIAGNOSIS — T8332XA Displacement of intrauterine contraceptive device, initial encounter: Secondary | ICD-10-CM

## 2013-03-26 DIAGNOSIS — T8339XA Other mechanical complication of intrauterine contraceptive device, initial encounter: Secondary | ICD-10-CM

## 2013-03-26 DIAGNOSIS — N912 Amenorrhea, unspecified: Secondary | ICD-10-CM

## 2013-03-26 DIAGNOSIS — Z01419 Encounter for gynecological examination (general) (routine) without abnormal findings: Secondary | ICD-10-CM

## 2013-03-26 LAB — POCT URINE PREGNANCY: Preg Test, Ur: NEGATIVE

## 2013-03-26 NOTE — Progress Notes (Signed)
Here today for gyn physical.  Has not been able to feel her IUD strings and is three days late for her period.

## 2013-03-26 NOTE — Progress Notes (Signed)
  Subjective:     Kimberly [name] is a 27 y.o.  [id number] female and is here for annual GYN exam and IUD string check.  Mirena placed 10/24/12. The patient reports being three days late for her period, unable to feel strings.  History   Social History  . Marital Status: Married    Spouse Name: N/A    Number of Children: 2  . Years of Education: N/A   Occupational History  . Not on file.   Social History Main Topics  . Smoking status: Former Smoker -- 1.00 packs/day for 10 years    Types: Cigarettes    Quit date: 01/31/2012  . Smokeless tobacco: Never Used  . Alcohol Use: No  . Drug Use: No  . Sexually Active: Yes    Birth Control/ Protection: None   Other Topics Concern  . Not on file   Social History Narrative   Lives with husband and three step children.     Health Maintenance  Topic Date Due  . Tetanus/tdap  03/30/2003  . Influenza Vaccine  07/08/2013  . Pap Smear  03/01/2015    The following portions of the patient's history were reviewed and updated as appropriate: allergies, current medications, past family history, past medical history, past social history, past surgical history and problem list.  Review of Systems Pertinent items are noted in HPI.   Objective:   BP 121/90  Pulse 107  Ht 5\' 7"  (1.702 m)  Wt 153 lb (69.4 kg)  BMI 23.96 kg/m2  LMP 02/22/2013  Breastfeeding? No GENERAL: Well-developed, well-nourished female in no acute distress.  HEENT: Normocephalic, atraumatic. Sclerae anicteric.  NECK: Supple. Normal thyroid.  LUNGS: Clear to auscultation bilaterally.  HEART: Regular rate and rhythm. BREASTS: Symmetric in size. No masses, skin changes, nipple drainage, or lymphadenopathy. ABDOMEN: Soft, nontender, nondistended. No organomegaly. PELVIC: Normal external female genitalia. Vagina is pink and rugated.  Normal discharge. Normal cervix contour. IUD strings not visualized; unable to palpate IUD strings with endocervical brush.  Pap smear  obtained. Uterus is normal in size. No adnexal mass or tenderness.  EXTREMITIES: No cyanosis, clubbing, or edema, 2+ distal pulses.   Urine HCG negative today  Assessment:    Healthy female exam.  Abnormal IUD check; strings not visualized     Plan:   Pap done, will follow up results and manage accordingly. Pelvic ultrasound ordered to evaluate IUD.  If no IUD seen, needs follow up pelvic X ray to rule out perforation/extrauterine misplacement.  Routine preventative health maintenance measures emphasized

## 2013-03-26 NOTE — Patient Instructions (Signed)
Preventive Care for Adults, Female A healthy lifestyle and preventive care can promote health and wellness. Preventive health guidelines for women include the following key practices.  A routine yearly physical is a good way to check with your caregiver about your health and preventive screening. It is a chance to share any concerns and updates on your health, and to receive a thorough exam.  Visit your dentist for a routine exam and preventive care every 6 months. Brush your teeth twice a day and floss once a day. Good oral hygiene prevents tooth decay and gum disease.  The frequency of eye exams is based on your age, health, family medical history, use of contact lenses, and other factors. Follow your caregiver's recommendations for frequency of eye exams.  Eat a healthy diet. Foods like vegetables, fruits, whole grains, low-fat dairy products, and lean protein foods contain the nutrients you need without too many calories. Decrease your intake of foods high in solid fats, added sugars, and salt. Eat the right amount of calories for you.Get information about a proper diet from your caregiver, if necessary.  Regular physical exercise is one of the most important things you can do for your health. Most adults should get at least 150 minutes of moderate-intensity exercise (any activity that increases your heart rate and causes you to sweat) each week. In addition, most adults need muscle-strengthening exercises on 2 or more days a week.  Maintain a healthy weight. The body mass index (BMI) is a screening tool to identify possible weight problems. It provides an estimate of body fat based on height and weight. Your caregiver can help determine your BMI, and can help you achieve or maintain a healthy weight.For adults 20 years and older:  A BMI below 18.5 is considered underweight.  A BMI of 18.5 to 24.9 is normal.  A BMI of 25 to 29.9 is considered overweight.  A BMI of 30 and above is  considered obese.  Maintain normal blood lipids and cholesterol levels by exercising and minimizing your intake of saturated fat. Eat a balanced diet with plenty of fruit and vegetables. Blood tests for lipids and cholesterol should begin at age 20 and be repeated every 5 years. If your lipid or cholesterol levels are high, you are over 50, or you are at high risk for heart disease, you may need your cholesterol levels checked more frequently.Ongoing high lipid and cholesterol levels should be treated with medicines if diet and exercise are not effective.  If you smoke, find out from your caregiver how to quit. If you do not use tobacco, do not start.  If you are pregnant, do not drink alcohol. If you are breastfeeding, be very cautious about drinking alcohol. If you are not pregnant and choose to drink alcohol, do not exceed 1 drink per day. One drink is considered to be 12 ounces (355 mL) of beer, 5 ounces (148 mL) of wine, or 1.5 ounces (44 mL) of liquor.  Avoid use of street drugs. Do not share needles with anyone. Ask for help if you need support or instructions about stopping the use of drugs.  High blood pressure causes heart disease and increases the risk of stroke. Your blood pressure should be checked at least every 1 to 2 years. Ongoing high blood pressure should be treated with medicines if weight loss and exercise are not effective.  If you are 55 to 29 years old, ask your caregiver if you should take aspirin to prevent strokes.  Diabetes   screening involves taking a blood sample to check your fasting blood sugar level. This should be done once every 3 years, after age 45, if you are within normal weight and without risk factors for diabetes. Testing should be considered at a younger age or be carried out more frequently if you are overweight and have at least 1 risk factor for diabetes.  Breast cancer screening is essential preventive care for women. You should practice "breast  self-awareness." This means understanding the normal appearance and feel of your breasts and may include breast self-examination. Any changes detected, no matter how small, should be reported to a caregiver. Women in their 20s and 30s should have a clinical breast exam (CBE) by a caregiver as part of a regular health exam every 1 to 3 years. After age 40, women should have a CBE every year. Starting at age 40, women should consider having a mammography (breast X-ray test) every year. Women who have a family history of breast cancer should talk to their caregiver about genetic screening. Women at a high risk of breast cancer should talk to their caregivers about having magnetic resonance imaging (MRI) and a mammography every year.  The Pap test is a screening test for cervical cancer. A Pap test can show cell changes on the cervix that might become cervical cancer if left untreated. A Pap test is a procedure in which cells are obtained and examined from the lower end of the uterus (cervix).  Women should have a Pap test starting at age 21.  Between ages 21 and 29, Pap tests should be repeated every 2 years.  Beginning at age 30, you should have a Pap test every 3 years as long as the past 3 Pap tests have been normal.  Some women have medical problems that increase the chance of getting cervical cancer. Talk to your caregiver about these problems. It is especially important to talk to your caregiver if a new problem develops soon after your last Pap test. In these cases, your caregiver may recommend more frequent screening and Pap tests.  The above recommendations are the same for women who have or have not gotten the vaccine for human papillomavirus (HPV).  If you had a hysterectomy for a problem that was not cancer or a condition that could lead to cancer, then you no longer need Pap tests. Even if you no longer need a Pap test, a regular exam is a good idea to make sure no other problems are  starting.  If you are between ages 65 and 70, and you have had normal Pap tests going back 10 years, you no longer need Pap tests. Even if you no longer need a Pap test, a regular exam is a good idea to make sure no other problems are starting.  If you have had past treatment for cervical cancer or a condition that could lead to cancer, you need Pap tests and screening for cancer for at least 20 years after your treatment.  If Pap tests have been discontinued, risk factors (such as a new sexual partner) need to be reassessed to determine if screening should be resumed.  The HPV test is an additional test that may be used for cervical cancer screening. The HPV test looks for the virus that can cause the cell changes on the cervix. The cells collected during the Pap test can be tested for HPV. The HPV test could be used to screen women aged 30 years and older, and should   be used in women of any age who have unclear Pap test results. After the age of 30, women should have HPV testing at the same frequency as a Pap test.  Colorectal cancer can be detected and often prevented. Most routine colorectal cancer screening begins at the age of 50 and continues through age 75. However, your caregiver may recommend screening at an earlier age if you have risk factors for colon cancer. On a yearly basis, your caregiver may provide home test kits to check for hidden blood in the stool. Use of a small camera at the end of a tube, to directly examine the colon (sigmoidoscopy or colonoscopy), can detect the earliest forms of colorectal cancer. Talk to your caregiver about this at age 50, when routine screening begins. Direct examination of the colon should be repeated every 5 to 10 years through age 75, unless early forms of pre-cancerous polyps or small growths are found.  Hepatitis C blood testing is recommended for all people born from 1945 through 1965 and any individual with known risks for hepatitis C.  Practice  safe sex. Use condoms and avoid high-risk sexual practices to reduce the spread of sexually transmitted infections (STIs). STIs include gonorrhea, chlamydia, syphilis, trichomonas, herpes, HPV, and human immunodeficiency virus (HIV). Herpes, HIV, and HPV are viral illnesses that have no cure. They can result in disability, cancer, and death. Sexually active women aged 25 and younger should be checked for chlamydia. Older women with new or multiple partners should also be tested for chlamydia. Testing for other STIs is recommended if you are sexually active and at increased risk.  Osteoporosis is a disease in which the bones lose minerals and strength with aging. This can result in serious bone fractures. The risk of osteoporosis can be identified using a bone density scan. Women ages 65 and over and women at risk for fractures or osteoporosis should discuss screening with their caregivers. Ask your caregiver whether you should take a calcium supplement or vitamin D to reduce the rate of osteoporosis.  Menopause can be associated with physical symptoms and risks. Hormone replacement therapy is available to decrease symptoms and risks. You should talk to your caregiver about whether hormone replacement therapy is right for you.  Use sunscreen with sun protection factor (SPF) of 30 or more. Apply sunscreen liberally and repeatedly throughout the day. You should seek shade when your shadow is shorter than you. Protect yourself by wearing long sleeves, pants, a wide-brimmed hat, and sunglasses year round, whenever you are outdoors.  Once a month, do a whole body skin exam, using a mirror to look at the skin on your back. Notify your caregiver of new moles, moles that have irregular borders, moles that are larger than a pencil eraser, or moles that have changed in shape or color.  Stay current with required immunizations.  Influenza. You need a dose every fall (or winter). The composition of the flu vaccine  changes each year, so being vaccinated once is not enough.  Pneumococcal polysaccharide. You need 1 to 2 doses if you smoke cigarettes or if you have certain chronic medical conditions. You need 1 dose at age 65 (or older) if you have never been vaccinated.  Tetanus, diphtheria, pertussis (Tdap, Td). Get 1 dose of Tdap vaccine if you are younger than age 65, are over 65 and have contact with an infant, are a healthcare worker, are pregnant, or simply want to be protected from whooping cough. After that, you need a Td   booster dose every 10 years. Consult your caregiver if you have not had at least 3 tetanus and diphtheria-containing shots sometime in your life or have a deep or dirty wound.  HPV. You need this vaccine if you are a woman age 26 or younger. The vaccine is given in 3 doses over 6 months.  Measles, mumps, rubella (MMR). You need at least 1 dose of MMR if you were born in 1957 or later. You may also need a second dose.  Meningococcal. If you are age 19 to 21 and a first-year college student living in a residence hall, or have one of several medical conditions, you need to get vaccinated against meningococcal disease. You may also need additional booster doses.  Zoster (shingles). If you are age 60 or older, you should get this vaccine.  Varicella (chickenpox). If you have never had chickenpox or you were vaccinated but received only 1 dose, talk to your caregiver to find out if you need this vaccine.  Hepatitis A. You need this vaccine if you have a specific risk factor for hepatitis A virus infection or you simply wish to be protected from this disease. The vaccine is usually given as 2 doses, 6 to 18 months apart.  Hepatitis B. You need this vaccine if you have a specific risk factor for hepatitis B virus infection or you simply wish to be protected from this disease. The vaccine is given in 3 doses, usually over 6 months. Preventive Services / Frequency Ages 19 to 39  Blood  pressure check.** / Every 1 to 2 years.  Lipid and cholesterol check.** / Every 5 years beginning at age 20.  Clinical breast exam.** / Every 3 years for women in their 20s and 30s.  Pap test.** / Every 2 years from ages 21 through 29. Every 3 years starting at age 30 through age 65 or 70 with a history of 3 consecutive normal Pap tests.  HPV screening.** / Every 3 years from ages 30 through ages 65 to 70 with a history of 3 consecutive normal Pap tests.  Hepatitis C blood test.** / For any individual with known risks for hepatitis C.  Skin self-exam. / Monthly.  Influenza immunization.** / Every year.  Pneumococcal polysaccharide immunization.** / 1 to 2 doses if you smoke cigarettes or if you have certain chronic medical conditions.  Tetanus, diphtheria, pertussis (Tdap, Td) immunization. / A one-time dose of Tdap vaccine. After that, you need a Td booster dose every 10 years.  HPV immunization. / 3 doses over 6 months, if you are 26 and younger.  Measles, mumps, rubella (MMR) immunization. / You need at least 1 dose of MMR if you were born in 1957 or later. You may also need a second dose.  Meningococcal immunization. / 1 dose if you are age 19 to 21 and a first-year college student living in a residence hall, or have one of several medical conditions, you need to get vaccinated against meningococcal disease. You may also need additional booster doses.  Varicella immunization.** / Consult your caregiver.  Hepatitis A immunization.** / Consult your caregiver. 2 doses, 6 to 18 months apart.  Hepatitis B immunization.** / Consult your caregiver. 3 doses usually over 6 months. Ages 40 to 64  Blood pressure check.** / Every 1 to 2 years.  Lipid and cholesterol check.** / Every 5 years beginning at age 20.  Clinical breast exam.** / Every year after age 40.  Mammogram.** / Every year beginning at age 40   and continuing for as long as you are in good health. Consult with your  caregiver.  Pap test.** / Every 3 years starting at age 30 through age 65 or 70 with a history of 3 consecutive normal Pap tests.  HPV screening.** / Every 3 years from ages 30 through ages 65 to 70 with a history of 3 consecutive normal Pap tests.  Fecal occult blood test (FOBT) of stool. / Every year beginning at age 50 and continuing until age 75. You may not need to do this test if you get a colonoscopy every 10 years.  Flexible sigmoidoscopy or colonoscopy.** / Every 5 years for a flexible sigmoidoscopy or every 10 years for a colonoscopy beginning at age 50 and continuing until age 75.  Hepatitis C blood test.** / For all people born from 1945 through 1965 and any individual with known risks for hepatitis C.  Skin self-exam. / Monthly.  Influenza immunization.** / Every year.  Pneumococcal polysaccharide immunization.** / 1 to 2 doses if you smoke cigarettes or if you have certain chronic medical conditions.  Tetanus, diphtheria, pertussis (Tdap, Td) immunization.** / A one-time dose of Tdap vaccine. After that, you need a Td booster dose every 10 years.  Measles, mumps, rubella (MMR) immunization. / You need at least 1 dose of MMR if you were born in 1957 or later. You may also need a second dose.  Varicella immunization.** / Consult your caregiver.  Meningococcal immunization.** / Consult your caregiver.  Hepatitis A immunization.** / Consult your caregiver. 2 doses, 6 to 18 months apart.  Hepatitis B immunization.** / Consult your caregiver. 3 doses, usually over 6 months. Ages 65 and over  Blood pressure check.** / Every 1 to 2 years.  Lipid and cholesterol check.** / Every 5 years beginning at age 20.  Clinical breast exam.** / Every year after age 40.  Mammogram.** / Every year beginning at age 40 and continuing for as long as you are in good health. Consult with your caregiver.  Pap test.** / Every 3 years starting at age 30 through age 65 or 70 with a 3  consecutive normal Pap tests. Testing can be stopped between 65 and 70 with 3 consecutive normal Pap tests and no abnormal Pap or HPV tests in the past 10 years.  HPV screening.** / Every 3 years from ages 30 through ages 65 or 70 with a history of 3 consecutive normal Pap tests. Testing can be stopped between 65 and 70 with 3 consecutive normal Pap tests and no abnormal Pap or HPV tests in the past 10 years.  Fecal occult blood test (FOBT) of stool. / Every year beginning at age 50 and continuing until age 75. You may not need to do this test if you get a colonoscopy every 10 years.  Flexible sigmoidoscopy or colonoscopy.** / Every 5 years for a flexible sigmoidoscopy or every 10 years for a colonoscopy beginning at age 50 and continuing until age 75.  Hepatitis C blood test.** / For all people born from 1945 through 1965 and any individual with known risks for hepatitis C.  Osteoporosis screening.** / A one-time screening for women ages 65 and over and women at risk for fractures or osteoporosis.  Skin self-exam. / Monthly.  Influenza immunization.** / Every year.  Pneumococcal polysaccharide immunization.** / 1 dose at age 65 (or older) if you have never been vaccinated.  Tetanus, diphtheria, pertussis (Tdap, Td) immunization. / A one-time dose of Tdap vaccine if you are over   65 and have contact with an infant, are a healthcare worker, or simply want to be protected from whooping cough. After that, you need a Td booster dose every 10 years.  Varicella immunization.** / Consult your caregiver.  Meningococcal immunization.** / Consult your caregiver.  Hepatitis A immunization.** / Consult your caregiver. 2 doses, 6 to 18 months apart.  Hepatitis B immunization.** / Check with your caregiver. 3 doses, usually over 6 months. ** Family history and personal history of risk and conditions may change your caregiver's recommendations. Document Released: 12/20/2001 Document Revised: 01/16/2012  Document Reviewed: 03/21/2011 [employer] Patient Information 2013 [employer].  

## 2013-04-03 ENCOUNTER — Ambulatory Visit (HOSPITAL_COMMUNITY)
Admission: RE | Admit: 2013-04-03 | Discharge: 2013-04-03 | Disposition: A | Discharge status: Home / Self Care | Payer: BC Managed Care – PPO | Source: Ambulatory Visit | Attending: Obstetrics & Gynecology | Admitting: Obstetrics & Gynecology

## 2013-04-03 ENCOUNTER — Other Ambulatory Visit: Payer: Self-pay | Admitting: Obstetrics & Gynecology

## 2013-04-03 ENCOUNTER — Other Ambulatory Visit (HOSPITAL_COMMUNITY): Payer: Self-pay | Admitting: Obstetrics & Gynecology

## 2013-04-03 ENCOUNTER — Ambulatory Visit (HOSPITAL_COMMUNITY): Admission: RE | Admit: 2013-04-03 | Payer: BC Managed Care – PPO | Source: Ambulatory Visit

## 2013-04-03 DIAGNOSIS — T8332XA Displacement of intrauterine contraceptive device, initial encounter: Secondary | ICD-10-CM

## 2013-04-03 DIAGNOSIS — Z30431 Encounter for routine checking of intrauterine contraceptive device: Secondary | ICD-10-CM | POA: Insufficient documentation

## 2013-04-03 DIAGNOSIS — N926 Irregular menstruation, unspecified: Secondary | ICD-10-CM

## 2013-04-03 LAB — HCG, QUANTITATIVE, PREGNANCY: hCG, Beta Chain, Quant, S: 16 m[IU]/mL — ABNORMAL HIGH (ref <5)

## 2013-04-09 ENCOUNTER — Ambulatory Visit (INDEPENDENT_AMBULATORY_CARE_PROVIDER_SITE_OTHER): Payer: BC Managed Care – PPO | Admitting: Family Medicine

## 2013-04-09 ENCOUNTER — Ambulatory Visit: Payer: BC Managed Care – PPO | Admitting: Family Medicine

## 2013-04-09 ENCOUNTER — Encounter: Payer: Self-pay | Admitting: Family Medicine

## 2013-04-09 VITALS — BP 87/65 | HR 128 | Ht 67.0 in | Wt 153.0 lb

## 2013-04-09 DIAGNOSIS — Z3201 Encounter for pregnancy test, result positive: Secondary | ICD-10-CM

## 2013-04-09 DIAGNOSIS — N912 Amenorrhea, unspecified: Secondary | ICD-10-CM

## 2013-04-09 NOTE — Progress Notes (Signed)
Here today, for pregnancy confirmation and prenatal labs.  Wants ultrasound as soon as possible to confirm if this is twins or not.  Patient went for ultrasound to confirm placement of IUD and no IUD found and patient had positive pregnancy test. Will need Rx for prenatals and generic phenergan.

## 2013-04-09 NOTE — Patient Instructions (Signed)
Ectopic Pregnancy  An ectopic pregnancy is when the fertilized egg attaches (implants) outside the uterus. Most ectopic pregnancies occur in the fallopian tube. Rarely do ectopic pregnancies occur on the ovary, intestine, pelvis, or cervix. An ectopic pregnancy does not have the ability to develop into a normal, healthy baby.   A ruptured ectopic pregnancy is one in which the fallopian tube gets torn or bursts and results in internal bleeding. Often there is intense abdominal pain, and sometimes, vaginal bleeding. Having an ectopic pregnancy can be a life-threatening experience. If left untreated, this dangerous condition can lead to a blood transfusion, abdominal surgery, or even death.  CAUSES   Damage to the fallopian tubes is the suspected cause in most ectopic pregnancies.   RISK FACTORS  Depending on your circumstances, the amount of risk of having an ectopic pregnancy will vary. There are 3 categories that may help you identify whether you are potentially at risk.  High Risk  · You have gone through infertility treatment.  · You have had a previous ectopic pregnancy.  · You have had previous tubal surgery.  · You have had previous surgery to have the fallopian tubes tied (tubal ligation).  · You have tubal problems or diseases.  · You have been exposed to DES. DES is a medicine that was used until 1971 and had effects on babies whose mothers took the medicine.  · You become pregnant while using an intrauterine device (IUD) for birth control.   Moderate Risk  · You have a history of infertility.  · You have a history of a sexually transmitted infection (STI).  · You have a history of pelvic inflammatory disease (PID).  · You have scarring from endometriosis.  · You have multiple sexual partners.  · You smoke.   Low Risk  · You have had previous pelvic surgery.  · You use vaginal douching.  · You became sexually active before 29 years of age.  SYMPTOMS   An ectopic pregnancy should be suspected in anyone who  has missed a period and has abdominal pain or bleeding.  · You may experience normal pregnancy symptoms, such as:  · Nausea.  · Tiredness.  · Breast tenderness.  · Symptoms that are not normal include:  · Pain with intercourse.  · Irregular vaginal bleeding or spotting.  · Cramping or pain on one side, or in the lower abdomen.  · Fast heartbeat.  · Passing out while having a bowel movement.  · Symptoms of a ruptured ectopic pregnancy and internal bleeding may include:  · Sudden, severe pain in the abdomen and pelvis.  · Dizziness or fainting.  · Pain in the shoulder area.  DIAGNOSIS   Tests that may be performed include:  · A pregnancy test.  · An ultrasound.  · Testing the specific level of pregnancy hormone in the bloodstream.  · Taking a sample of uterus tissue (dilation and curettage, D&C).  · Surgery to perform a visual exam of the inside of the abdomen using a lighted tube (laparoscopy).  TREATMENT   An injection of methotrexate medicine may be given. This is given if:  · The diagnosis is made early.  · The fallopian tube has not ruptured.  · You are considered to be a good candidate for the medicine.  Usually, pregnancy hormone blood levels are checked after methotrexate treatment. This is to be sure the medicine is effective. It may take 4 to 6 weeks for the pregnancy to be absorbed (though most pregnancies   will be absorbed by 3 weeks).  Surgical treatment may be needed. A lighted tube (laparoscope) is used to remove the tubal pregnancy. If severe internal bleeding occurs, a cut (incision) may be made in the lower abdomen (laparotomy), and the ectopic pregnancy is removed. This stops the bleeding. Part of the fallopian tube, or the whole tube, may be removed as well (salpingectomy). After surgery, pregnancy hormone tests may be done to be sure there is no pregnancy tissue left. You may receive a RhoGAM shot if you are Rh negative and the father is Rh positive, or if you do not know the Rh type of the father.  This is to prevent problems with any future pregnancy.  SEEK IMMEDIATE MEDICAL CARE IF:   You have any symptoms of an ectopic pregnancy. This is a medical emergency.  Document Released: 12/01/2004 Document Revised: 01/16/2012 Document Reviewed: 06/30/2011  [employer]® Patient Information ©2014 [employer].

## 2013-04-09 NOTE — Progress Notes (Signed)
  Subjective:    Patient ID: Kimberly [name], female    DOB: 07-09-1986, 27 y.o.   MRN: [id number]  HPI  Pt. With IUD insertion on 12/13.  Had IUD in place until LMP of 4/14 and then could not feel strings.  IUD srings not seen here and sent for u/s which did not reveal IUD and pt. Had negative UPT at the time and neg. U/s for pregnancy.  Had positive BHCG last Wed of 16.  Has had 2 UPT's positive at home since.  Negative UPT here today.  Reports breast tenderness/enlargement and weight gain.  Unsure about keeping the pregnancy.  Her husband would like termination, but she is undecided and does not want to, however, if multi-fetal gestation, may reconsider. Has had cramping, but no bleeding.  Wants u/s. Advised of normal rise in BHCG, should be near 100 today and above 1600 next week.   Review of Systems  Constitutional: Positive for unexpected weight change.  Respiratory: Negative for shortness of breath.   Genitourinary: Positive for menstrual problem (amenorrhea).       Objective:   Physical Exam  Vitals reviewed. Constitutional: She is oriented to person, place, and time. She appears well-developed and well-nourished. No distress.  HENT:  Head: Normocephalic and atraumatic.  Eyes: No scleral icterus.  Neck: Neck supple.  Cardiovascular: Normal rate.   Pulmonary/Chest: Effort normal.  Abdominal: Soft. There is no tenderness.  Neurological: She is alert and oriented to person, place, and time.          Assessment & Plan:

## 2013-04-09 NOTE — Assessment & Plan Note (Signed)
BHCG today and repeat in 48 hours.  If appropriately rising, would schedule for f/u U/S to confirm IUP next week.

## 2013-04-10 LAB — HCG, QUANTITATIVE, PREGNANCY: hCG, Beta Chain, Quant, S: 378.3 m[IU]/mL

## 2013-04-11 ENCOUNTER — Other Ambulatory Visit: Payer: Self-pay | Admitting: Family Medicine

## 2013-04-11 ENCOUNTER — Other Ambulatory Visit (INDEPENDENT_AMBULATORY_CARE_PROVIDER_SITE_OTHER): Payer: BC Managed Care – PPO | Admitting: *Deleted

## 2013-04-11 DIAGNOSIS — Z3490 Encounter for supervision of normal pregnancy, unspecified, unspecified trimester: Secondary | ICD-10-CM

## 2013-04-11 DIAGNOSIS — Z3201 Encounter for pregnancy test, result positive: Secondary | ICD-10-CM

## 2013-04-12 ENCOUNTER — Other Ambulatory Visit: Payer: BC Managed Care – PPO

## 2013-04-13 LAB — HCG, QUANTITATIVE, PREGNANCY: hCG, Beta Chain, Quant, S: 745.8 m[IU]/mL

## 2013-04-15 MED ORDER — PROMETHAZINE HCL 25 MG PO TABS: 25.0000 mg | ORAL_TABLET | Freq: Four times a day (QID) | ORAL | Status: DC | PRN

## 2013-04-15 MED ORDER — PRENAPLUS 27-1 MG PO TABS: 1.0000 | ORAL_TABLET | Freq: Every day | ORAL | Status: DC

## 2013-04-15 NOTE — Addendum Note (Signed)
Addended by: Barbara Cower on: 04/15/2013 11:55 AM   Modules accepted: Orders

## 2013-04-16 ENCOUNTER — Telehealth: Payer: Self-pay | Admitting: *Deleted

## 2013-04-16 DIAGNOSIS — IMO0001 Reserved for inherently not codable concepts without codable children: Secondary | ICD-10-CM

## 2013-04-16 DIAGNOSIS — Z3201 Encounter for pregnancy test, result positive: Secondary | ICD-10-CM

## 2013-04-17 ENCOUNTER — Other Ambulatory Visit: Payer: Self-pay | Admitting: Obstetrics & Gynecology

## 2013-04-17 ENCOUNTER — Ambulatory Visit (HOSPITAL_COMMUNITY)
Admission: RE | Admit: 2013-04-17 | Discharge: 2013-04-17 | Disposition: A | Discharge status: Home / Self Care | Payer: BC Managed Care – PPO | Source: Ambulatory Visit | Attending: Family Medicine | Admitting: Family Medicine

## 2013-04-17 ENCOUNTER — Other Ambulatory Visit: Payer: Self-pay | Admitting: Family Medicine

## 2013-04-17 DIAGNOSIS — IMO0001 Reserved for inherently not codable concepts without codable children: Secondary | ICD-10-CM

## 2013-04-17 DIAGNOSIS — Z3689 Encounter for other specified antenatal screening: Secondary | ICD-10-CM | POA: Insufficient documentation

## 2013-05-14 ENCOUNTER — Ambulatory Visit: Payer: BC Managed Care – PPO | Admitting: Family Medicine

## 2013-05-14 DIAGNOSIS — Z309 Encounter for contraceptive management, unspecified: Secondary | ICD-10-CM

## 2013-05-14 NOTE — Progress Notes (Signed)
Pt was scheduled for to come for Depo Provera shot.  We had not seen patient in office since September 2012.  Told Depo Provera not in stock here and also will need office visit to re-establish care here.  Will need to see provider and start process required to receive Depo provera RX.  Appt made for her to return to see provider next week.

## 2013-05-16 ENCOUNTER — Telehealth: Payer: Self-pay | Admitting: *Deleted

## 2013-05-16 NOTE — Telephone Encounter (Signed)
Error

## 2013-05-17 ENCOUNTER — Ambulatory Visit (INDEPENDENT_AMBULATORY_CARE_PROVIDER_SITE_OTHER): Payer: BC Managed Care – PPO | Admitting: Family Medicine

## 2013-05-17 ENCOUNTER — Encounter: Payer: Self-pay | Admitting: Family Medicine

## 2013-05-17 VITALS — BP 134/93 | HR 93 | Ht 67.5 in | Wt 150.0 lb

## 2013-05-17 DIAGNOSIS — Z01812 Encounter for preprocedural laboratory examination: Secondary | ICD-10-CM

## 2013-05-17 DIAGNOSIS — Z3042 Encounter for surveillance of injectable contraceptive: Secondary | ICD-10-CM

## 2013-05-17 DIAGNOSIS — IMO0001 Reserved for inherently not codable concepts without codable children: Secondary | ICD-10-CM

## 2013-05-17 DIAGNOSIS — Z3049 Encounter for surveillance of other contraceptives: Secondary | ICD-10-CM

## 2013-05-17 LAB — POCT URINE PREGNANCY: Preg Test, Ur: NEGATIVE

## 2013-05-17 MED ORDER — MEDROXYPROGESTERONE ACETATE 150 MG/ML IM SUSP: 150.0000 mg | INTRAMUSCULAR | Status: DC

## 2013-05-17 MED ORDER — MEDROXYPROGESTERONE ACETATE 150 MG/ML IM SUSP
150.0000 mg | Freq: Once | INTRAMUSCULAR | Status: AC
  Administered 2013-05-17: 150 mg via INTRAMUSCULAR

## 2013-05-17 NOTE — Progress Notes (Signed)
  Subjective:    Patient ID: Kimberly [name], female    DOB: 12-01-1985, 27 y.o.   MRN: [id number]  HPI  Had medical termination 3 wks ago.  Still bleeding.  On OC's since termination.  One intercourse, with condoms since.  Negative UPT today.  Review of Systems  Constitutional: Negative for fever, chills and fatigue.  Gastrointestinal: Negative for nausea, vomiting and diarrhea.  Genitourinary: Negative for urgency and pelvic pain.  Skin: Negative for rash.       Objective:   Physical Exam  Vitals reviewed. Constitutional: She appears well-developed and well-nourished.  Eyes: No scleral icterus.  Neck: Neck supple.  Cardiovascular: Normal rate.   Pulmonary/Chest: Effort normal.  Abdominal: Soft.  Skin: No rash noted.          Assessment & Plan:  Contraception - Plan: POCT urine pregnancy, medroxyPROGESTERone (DEPO-PROVERA) injection 150 mg  Depo-Provera contraceptive status - Plan: medroxyPROGESTERone (DEPO-PROVERA) 150 MG/ML injection

## 2013-05-17 NOTE — Patient Instructions (Addendum)

## 2013-05-17 NOTE — Assessment & Plan Note (Signed)
Start Depo today

## 2013-05-21 ENCOUNTER — Ambulatory Visit: Payer: Self-pay | Admitting: Family Medicine

## 2013-07-21 ENCOUNTER — Emergency Department (HOSPITAL_COMMUNITY): Payer: BC Managed Care – PPO

## 2013-07-21 ENCOUNTER — Encounter (HOSPITAL_COMMUNITY): Payer: Self-pay | Admitting: *Deleted

## 2013-07-21 ENCOUNTER — Emergency Department (HOSPITAL_COMMUNITY)
Admission: EM | Admit: 2013-07-21 | Discharge: 2013-07-21 | Disposition: A | Discharge status: Home / Self Care | Payer: BC Managed Care – PPO | Attending: Emergency Medicine | Admitting: Emergency Medicine

## 2013-07-21 DIAGNOSIS — Z8619 Personal history of other infectious and parasitic diseases: Secondary | ICD-10-CM | POA: Insufficient documentation

## 2013-07-21 DIAGNOSIS — Z862 Personal history of diseases of the blood and blood-forming organs and certain disorders involving the immune mechanism: Secondary | ICD-10-CM | POA: Insufficient documentation

## 2013-07-21 DIAGNOSIS — Z8659 Personal history of other mental and behavioral disorders: Secondary | ICD-10-CM | POA: Insufficient documentation

## 2013-07-21 DIAGNOSIS — S52201A Unspecified fracture of shaft of right ulna, initial encounter for closed fracture: Secondary | ICD-10-CM

## 2013-07-21 DIAGNOSIS — W11XXXA Fall on and from ladder, initial encounter: Secondary | ICD-10-CM

## 2013-07-21 DIAGNOSIS — Z79899 Other long term (current) drug therapy: Secondary | ICD-10-CM | POA: Insufficient documentation

## 2013-07-21 DIAGNOSIS — S22009A Unspecified fracture of unspecified thoracic vertebra, initial encounter for closed fracture: Secondary | ICD-10-CM | POA: Insufficient documentation

## 2013-07-21 DIAGNOSIS — Z8719 Personal history of other diseases of the digestive system: Secondary | ICD-10-CM | POA: Insufficient documentation

## 2013-07-21 DIAGNOSIS — Z791 Long term (current) use of non-steroidal anti-inflammatories (NSAID): Secondary | ICD-10-CM | POA: Insufficient documentation

## 2013-07-21 DIAGNOSIS — W1789XA Other fall from one level to another, initial encounter: Secondary | ICD-10-CM | POA: Insufficient documentation

## 2013-07-21 DIAGNOSIS — Y9289 Other specified places as the place of occurrence of the external cause: Secondary | ICD-10-CM | POA: Insufficient documentation

## 2013-07-21 DIAGNOSIS — IMO0002 Reserved for concepts with insufficient information to code with codable children: Secondary | ICD-10-CM | POA: Insufficient documentation

## 2013-07-21 DIAGNOSIS — S22080A Wedge compression fracture of T11-T12 vertebra, initial encounter for closed fracture: Secondary | ICD-10-CM

## 2013-07-21 DIAGNOSIS — Z8639 Personal history of other endocrine, nutritional and metabolic disease: Secondary | ICD-10-CM | POA: Insufficient documentation

## 2013-07-21 DIAGNOSIS — Z87891 Personal history of nicotine dependence: Secondary | ICD-10-CM | POA: Insufficient documentation

## 2013-07-21 DIAGNOSIS — Y9339 Activity, other involving climbing, rappelling and jumping off: Secondary | ICD-10-CM | POA: Insufficient documentation

## 2013-07-21 DIAGNOSIS — S52509A Unspecified fracture of the lower end of unspecified radius, initial encounter for closed fracture: Secondary | ICD-10-CM | POA: Insufficient documentation

## 2013-07-21 MED ORDER — OXYCODONE-ACETAMINOPHEN 5-325 MG PO TABS
1.0000 | ORAL_TABLET | Freq: Once | ORAL | Status: AC
  Administered 2013-07-21: 1 via ORAL

## 2013-07-21 MED ORDER — OXYCODONE-ACETAMINOPHEN 5-325 MG PO TABS
ORAL_TABLET | ORAL | Status: AC
  Administered 2013-07-21: 1 via ORAL
  Filled 2013-07-21: qty 1

## 2013-07-21 MED ORDER — ONDANSETRON 8 MG PO TBDP
8.0000 mg | ORAL_TABLET | Freq: Once | ORAL | Status: AC
  Administered 2013-07-21: 8 mg via ORAL
  Filled 2013-07-21: qty 1

## 2013-07-21 MED ORDER — HYDROMORPHONE HCL PF 2 MG/ML IJ SOLN
2.0000 mg | Freq: Once | INTRAMUSCULAR | Status: AC
  Administered 2013-07-21: 2 mg via INTRAMUSCULAR
  Filled 2013-07-21: qty 1

## 2013-07-21 MED ORDER — OXYCODONE-ACETAMINOPHEN 5-325 MG PO TABS: 1.0000 | ORAL_TABLET | Freq: Once | ORAL | Status: DC

## 2013-07-21 MED ORDER — OXYCODONE-ACETAMINOPHEN 5-325 MG PO TABS: 1.0000 | ORAL_TABLET | ORAL | Status: DC | PRN

## 2013-07-21 NOTE — ED Notes (Signed)
Family at bedside. Patient was given ice pack for hand.

## 2013-07-21 NOTE — ED Provider Notes (Signed)
CSN: [id number]     Arrival date & time 07/21/13  0136 History   First MD Initiated Contact with Patient 07/21/13 0425     Chief Complaint  Patient presents with  . Wrist Pain  . Back Pain   (Consider location/radiation/quality/duration/timing/severity/associated sxs/prior Treatment) Patient is a 27 y.o. female presenting with wrist pain and back pain. The history is provided by the patient.  Wrist Pain  Back Pain She was climbing down out of a tree stand and fell approximately 3 feet high injuring her right wrist and also her right ribs and upper back. She denies loss of consciousness. Pain is severe and she rates it at 9/10. Pain is worse with any movement. She denies lower back, pelvis, and leg injury.  Past Medical History  Diagnosis Date  . HSV-1 infection   . Abnormal Pap smear   . Anxiety   . Headache(784.0)   . Hyperthyroidism   . IBS (irritable bowel syndrome)   . Tachycardia    Past Surgical History  Procedure Laterality Date  . Wisdom tooth extraction      x4  . Cesarean section  09/13/2012    Procedure: CESAREAN SECTION;  Surgeon: Willodean Rosenthal, MD;  Location: [facility];  Service: Obstetrics;  Laterality: N/A;   Family History  Problem Relation Age of Onset  . Depression Mother     chronic  . Hearing loss Mother   . Hyperlipidemia Mother   . Vision loss Mother     beachets disease  . COPD Father   . Diabetes Father   . Cancer Maternal Grandmother 18    breast cancer  . Parkinsonism Maternal Grandmother   . Heart disease Maternal Grandmother   . Alzheimer's disease Paternal Grandmother   . Heart disease Paternal Grandfather     Died age 80  . Nephrolithiasis Paternal Grandfather   . Cancer Other 60    breast   History  Substance Use Topics  . Smoking status: Former Smoker -- 1.00 packs/day for 10 years    Types: Cigarettes    Quit date: 01/31/2012  . Smokeless tobacco: Never Used  . Alcohol Use: No   OB History   Grav Para Term Preterm  Abortions TAB SAB Ect Mult Living   1 1 0 1 0 0 0 0 1 2      Review of Systems  Musculoskeletal: Positive for back pain.  All other systems reviewed and are negative.    Allergies  Review of patient's allergies indicates no known allergies.  Home Medications   Current Outpatient Rx  Name  Route  Sig  Dispense  Refill  . calcium carbonate (TUMS - DOSED IN MG ELEMENTAL CALCIUM) 500 MG chewable tablet   Oral   Chew 1 tablet by mouth daily as needed. For heartburn         . diclofenac (VOLTAREN) 75 MG EC tablet      TAKE 1 TABLET (75 MG TOTAL) BY MOUTH 2 (TWO) TIMES DAILY WITH A MEAL.   60 tablet   2   . ibuprofen (ADVIL,MOTRIN) 600 MG tablet   Oral   Take 1 tablet (600 mg total) by mouth every 6 (six) hours.   60 tablet   0   . medroxyPROGESTERone (DEPO-PROVERA) 150 MG/ML injection   Intramuscular   Inject 1 mL (150 mg total) into the muscle every 3 (three) months.   1 mL   0   . omeprazole (PRILOSEC) 40 MG capsule   Oral   Take  1 capsule (40 mg total) by mouth daily.   30 capsule   11   . Prenatal Vit-Fe Fumarate-FA (PRENAPLUS) 27-1 MG TABS   Oral   Take 1 tablet by mouth daily.   30 tablet   11    BP 98/63  Pulse 81  Temp(Src) 98.9 F (37.2 C) (Oral)  Resp 16  Ht 5\' 7"  (1.702 m)  Wt 135 lb (61.236 kg)  BMI 21.14 kg/m2  SpO2 100% Physical Exam  Nursing note and vitals reviewed.  27 year old female, resting comfortably and in no acute distress. Vital signs are normal. Oxygen saturation is 100%, which is normal. Head is normocephalic and atraumatic. PERRLA, EOMI. Oropharynx is clear. Neck is nontender and supple without adenopathy or JVD. Back is moderately tender in the lower thoracic region. There is no CVA tenderness. Lungs are clear without rales, wheezes, or rhonchi. Chest is tender in the right parasternal area over an area of ecchymosis. There is also tenderness in the right lateral and anterolateral rib cage. Heart has regular rate and rhythm  without murmur. Abdomen is soft, flat, nontender without masses or hepatosplenomegaly and peristalsis is normoactive. Pelvis is stable and nontender. Extremities: There is a deformity of the right wrist consistent with a Colles' type fracture. Distal neurovascular exam is intact with prompt capillary refill normal sensation. No other extremity injuries seen. Skin is warm and dry without rash. Neurologic: Mental status is normal, cranial nerves are intact, there are no motor or sensory deficits.  ED Course  SPLINT APPLICATION Date/Time: 07/21/2013 4:56 AM Performed by: Dione Booze Authorized by: Preston Fleeting, Mcadoo Muzquiz Consent: Verbal consent obtained. written consent not obtained. Risks and benefits: risks, benefits and alternatives were discussed Consent given by: patient Patient understanding: patient states understanding of the procedure being performed Patient consent: the patient's understanding of the procedure matches consent given Procedure consent: procedure consent matches procedure scheduled Relevant documents: relevant documents present and verified Test results: test results available and properly labeled Site marked: the operative site was marked Imaging studies: imaging studies available Required items: required blood products, implants, devices, and special equipment available Patient identity confirmed: verbally with patient and arm band Time out: Immediately prior to procedure a "time out" was called to verify the correct patient, procedure, equipment, support staff and site/side marked as required. Location details: right wrist Splint type: sugar tong Supplies used: cotton padding, elastic bandage and Ortho-Glass Post-procedure: The splinted body part was neurovascularly unchanged following the procedure. Patient tolerance: Patient tolerated the procedure well with no immediate complications. Comments: Splint was placed by RN under my direction. I personally evaluated  neurovascular status after splint placement.   (including critical care time) Imaging Review Dg Ribs Unilateral W/chest Right  07/21/2013   *RADIOLOGY REPORT*  Clinical Data: Right rib pain  RIGHT RIBS AND CHEST - 3+ VIEW  Comparison: Prior radiograph from 01/28/2010  Findings:  Cardiac and mediastinal silhouettes are within normal limits.  The lungs are well inflated.  No airspace consolidation, pleural effusion or pulmonary edema.  No pneumothorax.  No acute rib fracture is identified.  Visualized thoracic spine is within normal limits.  IMPRESSION: 1. No acute cardiopulmonary process. 2. No acute rib fracture or other osseous abnormality.   Original Report Authenticated By: Rise Mu, M.D.   Dg Thoracic Spine 4v  07/21/2013   *RADIOLOGY REPORT*  Clinical Data: Fall, pain  THORACIC SPINE - 4+ VIEW  Comparison: Prior radiograph from 01/28/2010  Findings: A there are compression deformity of the T12  vertebral body is present with approximately 20% of anterior height loss. This finding is new as compared to the prior radiograph from 01/28/2010, and is concerning for acute fracture.  No retropulsion or listhesis identified.  Vertebral body heights are otherwise maintained.  IMPRESSION:  New anterior compression deformity of the T12 vertebral body with 20% of vertebral body height loss.  No retropulsion or listhesis identified.   Original Report Authenticated By: Rise Mu, M.D.   Dg Wrist Complete Right  07/21/2013   *RADIOLOGY REPORT*  Clinical Data: Wrist pain  RIGHT WRIST - COMPLETE 3+ VIEW  Comparison: None.  Findings: There is an acute fracture involving the distal radial metaphysis with impaction with minimal angulation. The normal intercarpal articulations are grossly intact.  There is an additional displaced fracture of the ulnar styloid. Diffuse soft tissue swelling is present about the wrist.  IMPRESSION: 1.  Acute fracture of the distal radial metaphysis with impaction.  2.   Acute displaced fracture of the ulnar styloid.   Original Report Authenticated By: Rise Mu, M.D.   Ct Thoracic Spine Wo Contrast  07/21/2013   *RADIOLOGY REPORT*  Clinical Data: Back pain.  CT THORACIC SPINE WITHOUT CONTRAST  Technique:  Multidetector CT imaging of the thoracic spine was performed without intravenous contrast administration. Multiplanar CT image reconstructions were also generated  Comparison: Radiograph of the thoracic spine performed earlier on the same day.  Findings: Limited CT views of the lower thoracic spine demonstrate an acute anterior compression fracture of the T12 vertebral body. No retropulsion or listhesis is identified. T11-12 and T12-L1 facet joints are normally aligned.  There is approximately 30% vertebral body height loss anteriorly.  No other acute osseous abnormality identified.  No paravertebral soft tissue abnormality appreciated.  IMPRESSION: Acute anterior compression fracture of T12 with 30% of anterior vertebral body height loss.  No listhesis or retropulsion identified.  No significant central canal stenosis.   Original Report Authenticated By: Rise Mu, M.D.   Images viewed by me.  MDM  No diagnosis found. Fall with injury to right wrist, chest, back. Wrist x-rays show fractures of the distal radius and ulnar styloid. She will be sent for x-rays of her ribs and thoracic spine.  Rib x-rays are negative the thoracic spine x-ray shows compression of T12. She is sent back for CT which is no evidence of retropulsion. There is approximately 30% loss of anterior height. This appears to be a stable fracture. She is placed in a sugar tong splint with improvement in pain control. Case has been discussed with Dr. Janee Morn of hand surgery who has reviewed the x-rays and states he will see her in his office tomorrow. Patient was reexamined after placement of splint and prior to discharge and neurovascular continues to be intact with normal sensation  and prompt capillary refill. I have explained to the patient and her husband what to look for for delayed capillary refill. She is discharged with prescription for oxycodone-acetaminophen and she is referred to orthopedics/neurosurgery for care of her compression fracture as well as to Dr. Janee Morn for hand surgery.    Dione Booze, MD 07/21/13 [phone]

## 2013-07-21 NOTE — ED Notes (Signed)
Patient with pain 9/10. Dr. Preston Fleeting notified and order for Percocet recv'd. Prior to d/c.  Resspirations even and unlabored. Skin warm/dry. Discharge instructions reviewed with patient at this time. Patient given opportunity to voice concerns/ask questions. Patient discharged at this time and left Emergency Department with steady gait.

## 2013-07-21 NOTE — ED Notes (Signed)
Pt reports pain in right wrist, ribs and back.  States that she fell while climbing out of a tree stand.  No numbness or tingling.

## 2013-07-23 HISTORY — PX: WRIST SURGERY: SHX841

## 2013-08-12 ENCOUNTER — Ambulatory Visit (INDEPENDENT_AMBULATORY_CARE_PROVIDER_SITE_OTHER): Payer: BC Managed Care – PPO | Admitting: *Deleted

## 2013-08-12 ENCOUNTER — Other Ambulatory Visit: Payer: Self-pay | Admitting: *Deleted

## 2013-08-12 DIAGNOSIS — Z23 Encounter for immunization: Secondary | ICD-10-CM

## 2013-08-12 DIAGNOSIS — Z3042 Encounter for surveillance of injectable contraceptive: Secondary | ICD-10-CM

## 2013-08-12 DIAGNOSIS — Z3049 Encounter for surveillance of other contraceptives: Secondary | ICD-10-CM

## 2013-08-12 MED ORDER — MEDROXYPROGESTERONE ACETATE 150 MG/ML IM SUSP
150.0000 mg | Freq: Once | INTRAMUSCULAR | Status: AC
  Administered 2013-08-12: 150 mg via INTRAMUSCULAR

## 2013-08-12 MED ORDER — MEDROXYPROGESTERONE ACETATE 150 MG/ML IM SUSP: 150.0000 mg | INTRAMUSCULAR | Status: DC

## 2013-08-12 NOTE — Progress Notes (Signed)
Pt came in today for a  Depo Provera injection and a flu shot.

## 2013-08-12 NOTE — Telephone Encounter (Signed)
Sent in another prescription for pt's Depo-Provera to pharmacy with 1 refill.  Pt will pick up before next appt.

## 2013-08-18 ENCOUNTER — Other Ambulatory Visit: Payer: Self-pay | Admitting: Family Medicine

## 2013-08-21 ENCOUNTER — Other Ambulatory Visit: Payer: Self-pay | Admitting: Neurosurgery

## 2013-08-26 ENCOUNTER — Encounter (HOSPITAL_COMMUNITY)
Admission: RE | Admit: 2013-08-26 | Discharge: 2013-08-26 | Disposition: A | Discharge status: Home / Self Care | Payer: BC Managed Care – PPO | Source: Ambulatory Visit | Attending: Neurosurgery | Admitting: Neurosurgery

## 2013-08-26 ENCOUNTER — Encounter (HOSPITAL_COMMUNITY): Payer: Self-pay

## 2013-08-26 DIAGNOSIS — Z01812 Encounter for preprocedural laboratory examination: Secondary | ICD-10-CM | POA: Diagnosis not present

## 2013-08-26 DIAGNOSIS — IMO0001 Reserved for inherently not codable concepts without codable children: Secondary | ICD-10-CM | POA: Diagnosis not present

## 2013-08-26 DIAGNOSIS — S22009A Unspecified fracture of unspecified thoracic vertebra, initial encounter for closed fracture: Secondary | ICD-10-CM | POA: Diagnosis not present

## 2013-08-26 DIAGNOSIS — Z79899 Other long term (current) drug therapy: Secondary | ICD-10-CM | POA: Diagnosis not present

## 2013-08-26 DIAGNOSIS — W1789XA Other fall from one level to another, initial encounter: Secondary | ICD-10-CM | POA: Diagnosis not present

## 2013-08-26 HISTORY — DX: Family history of other specified conditions: Z84.89

## 2013-08-26 LAB — BASIC METABOLIC PANEL
BUN: 10 mg/dL (ref 6–23)
Chloride: 104 mEq/L (ref 96–112)
GFR calc Af Amer: >90 mL/min (ref >90)
GFR calc non Af Amer: >90 mL/min (ref >90)
Potassium: 3.2 mEq/L — ABNORMAL LOW (ref 3.5–5.1)

## 2013-08-26 LAB — SURGICAL PCR SCREEN: MRSA, PCR: NEGATIVE

## 2013-08-26 LAB — CBC
MCHC: 34.5 g/dL (ref 30.0–36.0)
Platelets: 262 10*3/uL (ref 150–400)
RDW: 13.1 % (ref 11.5–15.5)
WBC: 9.3 10*3/uL (ref 4.0–10.5)

## 2013-08-26 LAB — HCG, SERUM, QUALITATIVE: Preg, Serum: NEGATIVE

## 2013-08-26 MED ORDER — CEFAZOLIN SODIUM-DEXTROSE 2-3 GM-% IV SOLR
2.0000 g | INTRAVENOUS | Status: AC
  Administered 2013-08-27: 2 g via INTRAVENOUS
  Filled 2013-08-26: qty 50

## 2013-08-26 NOTE — Progress Notes (Signed)
PCP Dr Lynnea Ferrier Denies seeing a cardiologist. Denies having a stress test, echo, or card cath.

## 2013-08-26 NOTE — Pre-Procedure Instructions (Signed)
LASHUNDA GREIS  08/26/2013   Your procedure is scheduled on:  Oct 21  Report to [facility]  2 * 3 at  12 noon.  Call this number if you have problems the morning of surgery: [phone]   Remember:   Do not eat food or drink liquids after midnight.   Take these medicines the morning of surgery with A SIP OF WATER: Omeprazole (Prilosec), Percocet (Oxycodone-acetaminophen) if needed  Stop taking Aspirin, Aleve, Ibuprofen, BC's, Goody's, Herbal medications, and Fish oil.   Do not wear jewelry, make-up or nail polish.  Do not wear lotions, powders, or perfumes. You may wear deodorant.  Do not shave 48 hours prior to surgery. Men may shave face and neck.  Do not bring valuables to the hospital.  [facility] is not responsible                  for any belongings or valuables.               Contacts, dentures or bridgework may not be worn into surgery.  Leave suitcase in the car. After surgery it may be brought to your room.  For patients admitted to the hospital, discharge time is determined by your                treatment team.               Patients discharged the day of surgery will not be allowed to drive  home.    Special Instructions: Shower using CHG 2 nights before surgery and the night before surgery.  If you shower the day of surgery use CHG.  Use special wash - you have one bottle of CHG for all showers.  You should use approximately 1/3 of the bottle for each shower.   Please read over the following fact sheets that you were given: Pain Booklet, Coughing and Deep Breathing, MRSA Information and Surgical Site Infection Prevention

## 2013-08-27 ENCOUNTER — Encounter (HOSPITAL_COMMUNITY)
Admission: RE | Disposition: A | Discharge status: Home / Self Care | Payer: Self-pay | Source: Ambulatory Visit | Attending: Neurosurgery

## 2013-08-27 ENCOUNTER — Ambulatory Visit (HOSPITAL_COMMUNITY)
Admission: RE | Admit: 2013-08-27 | Discharge: 2013-08-27 | Disposition: A | Discharge status: Home / Self Care | Payer: BC Managed Care – PPO | Source: Ambulatory Visit | Attending: Neurosurgery | Admitting: Neurosurgery

## 2013-08-27 ENCOUNTER — Ambulatory Visit (HOSPITAL_COMMUNITY): Payer: BC Managed Care – PPO | Admitting: Certified Registered"

## 2013-08-27 ENCOUNTER — Encounter (HOSPITAL_COMMUNITY): Payer: BC Managed Care – PPO | Admitting: Certified Registered"

## 2013-08-27 ENCOUNTER — Ambulatory Visit (HOSPITAL_COMMUNITY): Payer: BC Managed Care – PPO

## 2013-08-27 ENCOUNTER — Encounter (HOSPITAL_COMMUNITY): Payer: Self-pay | Admitting: *Deleted

## 2013-08-27 DIAGNOSIS — Z01812 Encounter for preprocedural laboratory examination: Secondary | ICD-10-CM | POA: Insufficient documentation

## 2013-08-27 DIAGNOSIS — S22009A Unspecified fracture of unspecified thoracic vertebra, initial encounter for closed fracture: Secondary | ICD-10-CM | POA: Diagnosis not present

## 2013-08-27 DIAGNOSIS — W1789XA Other fall from one level to another, initial encounter: Secondary | ICD-10-CM | POA: Insufficient documentation

## 2013-08-27 DIAGNOSIS — Z79899 Other long term (current) drug therapy: Secondary | ICD-10-CM | POA: Insufficient documentation

## 2013-08-27 DIAGNOSIS — IMO0001 Reserved for inherently not codable concepts without codable children: Secondary | ICD-10-CM | POA: Insufficient documentation

## 2013-08-27 HISTORY — PX: KYPHOPLASTY: SHX5884

## 2013-08-27 SURGERY — KYPHOPLASTY
Anesthesia: General | Site: Back | Wound class: Clean

## 2013-08-27 MED ORDER — SODIUM CHLORIDE 0.9 % IV SOLN: 250.0000 mL | INTRAVENOUS | Status: DC

## 2013-08-27 MED ORDER — OXYCODONE HCL 5 MG PO TABS
5.0000 mg | ORAL_TABLET | Freq: Once | ORAL | Status: AC | PRN
  Administered 2013-08-27: 5 mg via ORAL

## 2013-08-27 MED ORDER — PANTOPRAZOLE SODIUM 40 MG IV SOLR
40.0000 mg | Freq: Every day | INTRAVENOUS | Status: DC
  Filled 2013-08-27: qty 40

## 2013-08-27 MED ORDER — DOCUSATE SODIUM 100 MG PO CAPS: 100.0000 mg | ORAL_CAPSULE | Freq: Two times a day (BID) | ORAL | Status: DC

## 2013-08-27 MED ORDER — SODIUM CHLORIDE 0.9 % IJ SOLN: 3.0000 mL | Freq: Two times a day (BID) | INTRAMUSCULAR | Status: DC

## 2013-08-27 MED ORDER — LIDOCAINE-EPINEPHRINE 1 %-1:100000 IJ SOLN
INTRAMUSCULAR | Status: DC | PRN
  Administered 2013-08-27: 5 mg

## 2013-08-27 MED ORDER — PROPOFOL 10 MG/ML IV BOLUS
INTRAVENOUS | Status: DC | PRN
  Administered 2013-08-27: 150 mg via INTRAVENOUS

## 2013-08-27 MED ORDER — IBUPROFEN 800 MG PO TABS
800.0000 mg | ORAL_TABLET | Freq: Three times a day (TID) | ORAL | Status: DC | PRN
  Filled 2013-08-27: qty 1

## 2013-08-27 MED ORDER — FLEET ENEMA 7-19 GM/118ML RE ENEM
1.0000 | ENEMA | Freq: Once | RECTAL | Status: DC | PRN
  Filled 2013-08-27: qty 1

## 2013-08-27 MED ORDER — LORATADINE 10 MG PO TABS: 10.0000 mg | ORAL_TABLET | Freq: Every day | ORAL | Status: DC

## 2013-08-27 MED ORDER — ONDANSETRON HCL 4 MG/2ML IJ SOLN: 4.0000 mg | INTRAMUSCULAR | Status: DC | PRN

## 2013-08-27 MED ORDER — HYDROMORPHONE HCL PF 1 MG/ML IJ SOLN
INTRAMUSCULAR | Status: AC
  Filled 2013-08-27: qty 1

## 2013-08-27 MED ORDER — CEFAZOLIN SODIUM 1-5 GM-% IV SOLN
1.0000 g | Freq: Three times a day (TID) | INTRAVENOUS | Status: DC
  Filled 2013-08-27 (×2): qty 50

## 2013-08-27 MED ORDER — GLYCOPYRROLATE 0.2 MG/ML IJ SOLN
INTRAMUSCULAR | Status: DC | PRN
  Administered 2013-08-27: 0.4 mg via INTRAVENOUS

## 2013-08-27 MED ORDER — ALUM & MAG HYDROXIDE-SIMETH 200-200-20 MG/5ML PO SUSP: 30.0000 mL | Freq: Four times a day (QID) | ORAL | Status: DC | PRN

## 2013-08-27 MED ORDER — OXYCODONE HCL 5 MG/5ML PO SOLN: 5.0000 mg | Freq: Once | ORAL | Status: AC | PRN

## 2013-08-27 MED ORDER — PROMETHAZINE HCL 25 MG PO TABS: 25.0000 mg | ORAL_TABLET | Freq: Four times a day (QID) | ORAL | Status: DC | PRN

## 2013-08-27 MED ORDER — SENNA 8.6 MG PO TABS: 1.0000 | ORAL_TABLET | Freq: Two times a day (BID) | ORAL | Status: DC

## 2013-08-27 MED ORDER — ACETAMINOPHEN 650 MG RE SUPP: 650.0000 mg | RECTAL | Status: DC | PRN

## 2013-08-27 MED ORDER — NEOSTIGMINE METHYLSULFATE 1 MG/ML IJ SOLN
INTRAMUSCULAR | Status: DC | PRN
  Administered 2013-08-27: 3 mg via INTRAVENOUS

## 2013-08-27 MED ORDER — DIAZEPAM 5 MG PO TABS
5.0000 mg | ORAL_TABLET | Freq: Four times a day (QID) | ORAL | Status: DC | PRN
  Administered 2013-08-27: 5 mg via ORAL

## 2013-08-27 MED ORDER — LIDOCAINE HCL (CARDIAC) 20 MG/ML IV SOLN
INTRAVENOUS | Status: DC | PRN
  Administered 2013-08-27: 40 mg via INTRAVENOUS

## 2013-08-27 MED ORDER — ZOLPIDEM TARTRATE 5 MG PO TABS: 5.0000 mg | ORAL_TABLET | Freq: Every evening | ORAL | Status: DC | PRN

## 2013-08-27 MED ORDER — ONDANSETRON HCL 4 MG/2ML IJ SOLN
INTRAMUSCULAR | Status: DC | PRN
  Administered 2013-08-27: 4 mg via INTRAVENOUS

## 2013-08-27 MED ORDER — HYDROMORPHONE HCL PF 1 MG/ML IJ SOLN
0.2500 mg | INTRAMUSCULAR | Status: DC | PRN
  Administered 2013-08-27 (×4): 0.5 mg via INTRAVENOUS

## 2013-08-27 MED ORDER — PHENOL 1.4 % MT LIQD: 1.0000 | OROMUCOSAL | Status: DC | PRN

## 2013-08-27 MED ORDER — HYDROCODONE-ACETAMINOPHEN 10-325 MG PO TABS: 1.0000 | ORAL_TABLET | ORAL | Status: DC | PRN

## 2013-08-27 MED ORDER — FENTANYL CITRATE 0.05 MG/ML IJ SOLN
INTRAMUSCULAR | Status: DC | PRN
  Administered 2013-08-27: 150 ug via INTRAVENOUS

## 2013-08-27 MED ORDER — KCL IN DEXTROSE-NACL 20-5-0.45 MEQ/L-%-% IV SOLN
INTRAVENOUS | Status: DC
  Filled 2013-08-27 (×2): qty 1000

## 2013-08-27 MED ORDER — BISACODYL 10 MG RE SUPP: 10.0000 mg | Freq: Every day | RECTAL | Status: DC | PRN

## 2013-08-27 MED ORDER — POLYETHYLENE GLYCOL 3350 17 G PO PACK
17.0000 g | PACK | Freq: Every day | ORAL | Status: DC | PRN
  Filled 2013-08-27: qty 1

## 2013-08-27 MED ORDER — OXYCODONE-ACETAMINOPHEN 5-325 MG PO TABS: 1.0000 | ORAL_TABLET | ORAL | Status: DC | PRN

## 2013-08-27 MED ORDER — PANTOPRAZOLE SODIUM 40 MG PO TBEC: 40.0000 mg | DELAYED_RELEASE_TABLET | Freq: Every day | ORAL | Status: DC

## 2013-08-27 MED ORDER — MIDAZOLAM HCL 5 MG/5ML IJ SOLN
INTRAMUSCULAR | Status: DC | PRN
  Administered 2013-08-27: 2 mg via INTRAVENOUS

## 2013-08-27 MED ORDER — ALPRAZOLAM 0.5 MG PO TABS: 1.0000 mg | ORAL_TABLET | Freq: Three times a day (TID) | ORAL | Status: DC | PRN

## 2013-08-27 MED ORDER — IOHEXOL 300 MG/ML  SOLN
INTRAMUSCULAR | Status: DC | PRN
  Administered 2013-08-27: 1 mL

## 2013-08-27 MED ORDER — OXYCODONE HCL 5 MG PO TABS
ORAL_TABLET | ORAL | Status: AC
  Filled 2013-08-27: qty 1

## 2013-08-27 MED ORDER — ACETAMINOPHEN 325 MG PO TABS: 650.0000 mg | ORAL_TABLET | ORAL | Status: DC | PRN

## 2013-08-27 MED ORDER — DIAZEPAM 5 MG PO TABS
ORAL_TABLET | ORAL | Status: AC
  Filled 2013-08-27: qty 1

## 2013-08-27 MED ORDER — HYDROCODONE-ACETAMINOPHEN 5-325 MG PO TABS: 1.0000 | ORAL_TABLET | ORAL | Status: DC | PRN

## 2013-08-27 MED ORDER — ROCURONIUM BROMIDE 100 MG/10ML IV SOLN
INTRAVENOUS | Status: DC | PRN
  Administered 2013-08-27: 30 mg via INTRAVENOUS

## 2013-08-27 MED ORDER — MENTHOL 3 MG MT LOZG: 1.0000 | LOZENGE | OROMUCOSAL | Status: DC | PRN

## 2013-08-27 MED ORDER — MEDROXYPROGESTERONE ACETATE 150 MG/ML IM SUSP: 150.0000 mg | INTRAMUSCULAR | Status: DC

## 2013-08-27 MED ORDER — BUPIVACAINE HCL (PF) 0.25 % IJ SOLN
INTRAMUSCULAR | Status: DC | PRN
  Administered 2013-08-27: 5 mL

## 2013-08-27 MED ORDER — MORPHINE SULFATE 2 MG/ML IJ SOLN: 1.0000 mg | INTRAMUSCULAR | Status: DC | PRN

## 2013-08-27 MED ORDER — LACTATED RINGERS IV SOLN
INTRAVENOUS | Status: DC
  Administered 2013-08-27 (×2): via INTRAVENOUS

## 2013-08-27 MED ORDER — MEPERIDINE HCL 25 MG/ML IJ SOLN: 6.2500 mg | INTRAMUSCULAR | Status: DC | PRN

## 2013-08-27 MED ORDER — ONDANSETRON HCL 4 MG/2ML IJ SOLN: 4.0000 mg | Freq: Once | INTRAMUSCULAR | Status: DC | PRN

## 2013-08-27 MED ORDER — ARTIFICIAL TEARS OP OINT
TOPICAL_OINTMENT | OPHTHALMIC | Status: DC | PRN
  Administered 2013-08-27: 1 via OPHTHALMIC

## 2013-08-27 MED ORDER — SODIUM CHLORIDE 0.9 % IJ SOLN: 3.0000 mL | INTRAMUSCULAR | Status: DC | PRN

## 2013-08-27 SURGICAL SUPPLY — 47 items
ADH SKN CLS APL DERMABOND .7 (GAUZE/BANDAGES/DRESSINGS) ×1
BLADE SURG 15 STRL LF DISP TIS (BLADE) ×1 IMPLANT
BLADE SURG 15 STRL SS (BLADE) ×2
BLADE SURG ROTATE 9660 (MISCELLANEOUS) IMPLANT
CEMENT BONE KYPHX HV R (Orthopedic Implant) ×1 IMPLANT
CEMENT KYPHON CX01A KIT/MIXER (Cement) ×1 IMPLANT
CEMENT MIXER ×1 IMPLANT
CONT SPEC 4OZ CLIKSEAL STRL BL (MISCELLANEOUS) ×3 IMPLANT
DERMABOND ADVANCED (GAUZE/BANDAGES/DRESSINGS) ×1
DERMABOND ADVANCED .7 DNX12 (GAUZE/BANDAGES/DRESSINGS) ×1 IMPLANT
DRAPE C-ARM 42X72 X-RAY (DRAPES) ×2 IMPLANT
DRAPE LAPAROTOMY 100X72X124 (DRAPES) ×2 IMPLANT
DRAPE PROXIMA HALF (DRAPES) ×2 IMPLANT
DRAPE SURG 17X23 STRL (DRAPES) ×2 IMPLANT
DRESSING TELFA 8X3 (GAUZE/BANDAGES/DRESSINGS) IMPLANT
DRSG OPSITE POSTOP 3X4 (GAUZE/BANDAGES/DRESSINGS) ×1 IMPLANT
DURAPREP 26ML APPLICATOR (WOUND CARE) ×2 IMPLANT
GAUZE SPONGE 4X4 16PLY XRAY LF (GAUZE/BANDAGES/DRESSINGS) ×2 IMPLANT
GLOVE BIO SURGEON STRL SZ 6.5 (GLOVE) ×1 IMPLANT
GLOVE BIO SURGEON STRL SZ8 (GLOVE) ×2 IMPLANT
GLOVE BIOGEL PI IND STRL 6.5 (GLOVE) IMPLANT
GLOVE BIOGEL PI IND STRL 8.5 (GLOVE) ×1 IMPLANT
GLOVE BIOGEL PI INDICATOR 6.5 (GLOVE) ×1
GLOVE BIOGEL PI INDICATOR 8.5 (GLOVE) ×1
GLOVE EXAM NITRILE LRG STRL (GLOVE) IMPLANT
GLOVE EXAM NITRILE MD LF STRL (GLOVE) ×1 IMPLANT
GLOVE EXAM NITRILE XL STR (GLOVE) IMPLANT
GLOVE EXAM NITRILE XS STR PU (GLOVE) IMPLANT
GLOVE INDICATOR 6.5 STRL GRN (GLOVE) ×1 IMPLANT
GLOVE OPTIFIT SS 6.5 STRL BRWN (GLOVE) ×1 IMPLANT
GOWN BRE IMP SLV AUR LG STRL (GOWN DISPOSABLE) ×2 IMPLANT
GOWN BRE IMP SLV AUR XL STRL (GOWN DISPOSABLE) ×1 IMPLANT
GOWN STRL REIN 2XL LVL4 (GOWN DISPOSABLE) IMPLANT
KIT BASIN OR (CUSTOM PROCEDURE TRAY) ×2 IMPLANT
KIT ROOM TURNOVER OR (KITS) ×2 IMPLANT
NDL HYPO 25X1 1.5 SAFETY (NEEDLE) ×1 IMPLANT
NEEDLE HYPO 25X1 1.5 SAFETY (NEEDLE) ×2 IMPLANT
NS IRRIG 1000ML POUR BTL (IV SOLUTION) ×2 IMPLANT
PACK SURGICAL SETUP 50X90 (CUSTOM PROCEDURE TRAY) ×2 IMPLANT
PAD ARMBOARD 7.5X6 YLW CONV (MISCELLANEOUS) ×6 IMPLANT
STAPLER SKIN PROX WIDE 3.9 (STAPLE) ×1 IMPLANT
SUT VIC AB 3-0 SH 8-18 (SUTURE) ×2 IMPLANT
SYR CONTROL 10ML LL (SYRINGE) ×4 IMPLANT
TOWEL OR 17X24 6PK STRL BLUE (TOWEL DISPOSABLE) ×2 IMPLANT
TOWEL OR 17X26 10 PK STRL BLUE (TOWEL DISPOSABLE) ×2 IMPLANT
TRAY KYPHOPAK 20/3 ONESTEP 1ST (MISCELLANEOUS) ×1 IMPLANT
TRAY KYPHOPAK 20/3 ONESTEP CDS (KITS) ×1 IMPLANT

## 2013-08-27 NOTE — Anesthesia Postprocedure Evaluation (Signed)
Anesthesia Post Note  Patient: Kimberly [name]  Procedure(s) Performed: Procedure(s) (LRB): Thoracic Twelve Kyphoplasty (N/A)  Anesthesia type: general  Patient location: PACU  Post pain: Pain level controlled  Post assessment: Patient's Cardiovascular Status Stable  Last Vitals:  Filed Vitals:   08/27/13 1622  BP: 113/62  Pulse: 82  Temp:   Resp: 20    Post vital signs: Reviewed and stable  Level of consciousness: sedated  Complications: No apparent anesthesia complications

## 2013-08-27 NOTE — Transfer of Care (Signed)
Immediate Anesthesia Transfer of Care Note  Patient: Kimberly [name]  Procedure(s) Performed: Procedure(s) with comments: Thoracic Twelve Kyphoplasty (N/A) - T12 Kyphoplasty  Patient Location: PACU  Anesthesia Type:General  Level of Consciousness: awake, alert  and oriented  Airway & Oxygen Therapy: Patient Spontanous Breathing and Patient connected to nasal cannula oxygen  Post-op Assessment: Report given to PACU RN, Post -op Vital signs reviewed and stable and Patient moving all extremities  Post vital signs: Reviewed and stable  Complications: No apparent anesthesia complications

## 2013-08-27 NOTE — Progress Notes (Signed)
Pt doing very well. Pt given D/C instructions with verbal understanding. Pt D/C'd home via walking per MD order @ 1820. Rema Fendt, RN

## 2013-08-27 NOTE — Preoperative (Signed)
Beta Blockers   Reason not to administer Beta Blockers:Not Applicable 

## 2013-08-27 NOTE — Brief Op Note (Signed)
08/27/2013  3:26 PM  PATIENT:  Kimberly [name]  27 y.o. female  PRE-OPERATIVE DIAGNOSIS:  Thoracic fracture T 12 , Lumbago  POST-OPERATIVE DIAGNOSIS:  Thoracic fracture T 12 , Lumbago  PROCEDURE:  Procedure(s) with comments: Thoracic Twelve Kyphoplasty (N/A) - T12 Kyphoplasty  SURGEON:  Surgeon(s) and Role:    * Ailynn Gow, MD - Primary  PHYSICIAN ASSISTANT:   ASSISTANTS: None   ANESTHESIA:   general  EBL:  Total I/O In: 1200 [I.V.:1200] Out: -   BLOOD ADMINISTERED:none  DRAINS: none   LOCAL MEDICATIONS USED:  LIDOCAINE   SPECIMEN:  No Specimen  DISPOSITION OF SPECIMEN:  N/A  COUNTS:  YES  TOURNIQUET:  * No tourniquets in log *  DICTATION: DICTATION: Patient is27 year old female with severe back pain and progressive collapse of T 12 following post-traumatic T 12 fracture which is refractory to bracing.  It was elected to take her to surgery for kyphoplasty procedure of T 12.  PROCEDURE:  Following the smooth and uncomplicated induction of general endotracheal anesthesia, the patient was placed in a prone position on chest rolls.  C-arm fluoroscopy was positioned in both the AP and lateral planes, centered on the T 12 vertebra.  His back was prepped and draped in the usual sterile fashion with Duraprep.  Using a right uni-pedicular approach, the right T 12 pedicle and vertebral body were entered with the trochar using standard landmarks.  The drill was used, followed by a 15 cc Kyphon balloon, which was used to re-expand the broken vertebra.  I was not able to reexpand the vertebra a great deal. Subsequently, 8 cc of bone cement was placed into the void created by the balloon and was seen to fill the vertebra in both the AP and lateral direction with good interdigitation and without apparent extravasation. The bone void filler was then removed.  Final X-ray demonstrated good filling within the fractured vertebra.  The incision was closed with a single 3-0 vicryl stitch  and dressed with Dermabond and an occlusive dressing. The patient was returned to the OR gurney and extubated in the OR and taken to Recovery in stable and satisfactory condition, having tolerated the procedure well.  Counts were correct at the end of the case.   PLAN OF CARE: Admit for overnight observation  PATIENT DISPOSITION:  PACU - hemodynamically stable.   Delay start of Pharmacological VTE agent (>24hrs) due to surgical blood loss or risk of bleeding: yes  

## 2013-08-27 NOTE — Anesthesia Preprocedure Evaluation (Signed)
Anesthesia Evaluation  Patient identified by MRN, date of birth, ID band Patient awake    Reviewed: Allergy & Precautions, H&P , NPO status , Patient's Chart, lab work & pertinent test results  Airway Mallampati: I TM Distance: >3 FB Neck ROM: Full    Dental   Pulmonary          Cardiovascular     Neuro/Psych    GI/Hepatic GERD-  Controlled and Medicated,  Endo/Other    Renal/GU      Musculoskeletal   Abdominal   Peds  Hematology   Anesthesia Other Findings   Reproductive/Obstetrics                           Anesthesia Physical Anesthesia Plan  ASA: II  Anesthesia Plan: General   Post-op Pain Management:    Induction: Intravenous  Airway Management Planned: Oral ETT  Additional Equipment:   Intra-op Plan:   Post-operative Plan: Extubation in OR  Informed Consent: I have reviewed the patients History and Physical, chart, labs and discussed the procedure including the risks, benefits and alternatives for the proposed anesthesia with the patient or authorized representative who has indicated his/her understanding and acceptance.     Plan Discussed with: CRNA and Surgeon  Anesthesia Plan Comments:         Anesthesia Quick Evaluation

## 2013-08-27 NOTE — Anesthesia Procedure Notes (Signed)
Procedure Name: Intubation Date/Time: 08/27/2013 2:38 PM Performed by: Jerilee Hoh Pre-anesthesia Checklist: Patient identified, Emergency Drugs available, Suction available and Patient being monitored Patient Re-evaluated:Patient Re-evaluated prior to inductionOxygen Delivery Method: Circle system utilized Preoxygenation: Pre-oxygenation with 100% oxygen Intubation Type: IV induction Ventilation: Mask ventilation without difficulty Laryngoscope Size: Mac and 3 Grade View: Grade I Tube type: Oral Tube size: 7.5 mm Number of attempts: 1 Airway Equipment and Method: Stylet Placement Confirmation: ETT inserted through vocal cords under direct vision,  positive ETCO2 and breath sounds checked- equal and bilateral Secured at: 22 cm Tube secured with: Tape Dental Injury: Teeth and Oropharynx as per pre-operative assessment

## 2013-08-27 NOTE — Interval H&P Note (Signed)
History and Physical Interval Note:  08/27/2013 8:09 AM  Kimberly [name]  has presented today for surgery, with the diagnosis of Thoracic fracture, Lumbago  The various methods of treatment have been discussed with the patient and family. After consideration of risks, benefits and other options for treatment, the patient has consented to  Procedure(s) with comments: T12 Kyphoplasty (N/A) - T12 Kyphoplasty as a surgical intervention .  The patient's history has been reviewed, patient examined, no change in status, stable for surgery.  I have reviewed the patient's chart and labs.  Questions were answered to the patient's satisfaction.     Tandra Rosado D

## 2013-08-27 NOTE — H&P (Signed)
> [address] [place], [place] [address] Phone: [phone]   Patient ID:   [id number] Patient: Kimberly [name]  Date of Birth: 1985-11-30 Visit Type: Office Visit   Date: 08/21/2013 03:30 PM Provider: Danae Orleans. Venetia Maxon   Historian: self  This 27 year old female presents for Follow Up of back pain.  HISTORY OF PRESENT ILLNESS: 1.  Follow Up of back pain   T12 compression fx 07/21/13  Pain persists, requiring Norco 5/325 8-10 per day.  She states she is unable lie on her back d/t pain.  She is wearing her TLSO.  Fractured right wrist is improving with hand therapy - followed by ortho.  X-rays on [facility]  Patient says that the pain is still unbearable and it is limiting her ability to function.  She says that she is not able to lay down or get any relief from her pain.   Medical/Surgical/Interim History  Reviewed, no change.  Last detailed document date:07/25/2013.    PAST MEDICAL HISTORY, SURGICAL HISTORY, FAMILY HISTORY, SOCIAL HISTORY AND REVIEW OF SYSTEMS I have reviewed the patient's past medical, surgical, family and social history as well as the comprehensive review of systems as included on the [facility] history form dated 07/25/2013, which I have signed.  Family History: Reviewed, no changes.  Last detailed document date:07/25/2013.  Patient reports there is no relevant family history.   Social History: Reviewed, no changes. Last detailed document date: 07/25/2013.      Medications (added, continued or stopped this visit):   Medication Dose Prescribed Else Ind Started Stopped  hydrocodone 10 mg-acetaminophen 325 mg tablet 10 mg-325 mg N 08/21/2013   Norco 5 mg-325 mg tablet 5 mg-325 mg N 08/02/2013      Allergies:  Ingredient Reaction Medication Name Comment  NO KNOWN ALLERGIES     No known allergies. Reviewed, no changes.   Vitals Date Temp F BP Pulse Ht In Wt Lb BMI BSA Pain Score  08/21/2013   110/74 102 67.5 138 21.29  6/10      DIAGNOSTIC RESULTS Radiographs demonstrate progressive height loss at the T12 level due to her compression fracture, when compared to her initial radiographs.    IMPRESSION Patient wishes to proceed with a kyphoplasty procedure of T12.  I think this is reasonable considering that she is continuing to complain of severe and unrelenting pain and also that she has had progression of the fracture radiographically.  I have written her for stronger pain medication.  Assessment/Plan # Detail Type Description   1. Assessment Closed fracture of thoracic vertebra (805.2).       2. Assessment Lumbago (724.2).        Pain Assessment/Treatment Pain Scale: 6/10. Method: Numeric Pain Intensity Scale. Pain Assessment/Treatment follow-up plan of care: Pain management to be discussed by Dr. Venetia Maxon..  Kyphoplasty on 08/27/2013 at T12.  Some benefits were discussed in detail with the patient wishes to proceed.  Orders: Diagnostic Procedures: Assessment Procedure  805.2 kyphoplasty T12 [facility]    Medication Prescribed Today    Rx Quantity Refills  HYDROCODONE-ACETAMINOPHEN 10 mg-325 mg  60 0            Provider:  Danae Orleans. Venetia Maxon  08/22/2013 07:58 AM Dictation edited by: Danae Orleans. Venetia Maxon    CC Providers: Maeola Harman [address] [place], [place] [address] ----------------------------------------------------------------------------------------------------------------------------------------------------------------------         Electronically signed by Danae Orleans. Venetia Maxon on 08/22/2013 07:58 AM  [address] [place], [place] [address] Phone: [phone]   Patient ID:   [id number] Patient: Kimberly [name]  Date of Birth: 1986-02-07 Visit Type: Office Visit   Date: 07/25/2013 01:30 PM Provider: Danae Orleans. Venetia Maxon   Historian: self  This 27 year old female presents for back pain.  HISTORY OF PRESENT  ILLNESS: 1.  back pain   Healthy 27y.o. female reports falling from third rung of treestand ladder on 07/21/13, suffering Fx right wrist & Compression Fx T12.    Some back pain, but pain predominantly right wrist post-op.  Percocet 5/325 two q4-6hrs since wrist surgery.  Hx: 11/13 C-Section        07/21/13 Right Wrist Fx repair        Hyperthyroidism  Smokes ~1/3 PPD  CT & X-ray on [facility]  Patient is complaining of quite severe back pain.  She says it is about as bad as her wrist pain.  She is having to take care of 60-month-old twins and is finding this difficult.  She denies leg pain.     PAST MEDICAL/SURGICAL HISTORY  (Detailed)  Disease/disorder Onset Date Management Date Comments    C-section      Wrist surgery 07/21/2013   Thyroid disease          PAST MEDICAL HISTORY, SURGICAL HISTORY, FAMILY HISTORY, SOCIAL HISTORY AND REVIEW OF SYSTEMS I have reviewed the patient's past medical, surgical, family and social history as well as the comprehensive review of systems as included on the [facility] history form dated 07/25/2013, which I have signed.  Family History  (Detailed) Patient reports there is no relevant family history.   SOCIAL HISTORY  (Detailed) Tobacco use reviewed. Preferred language is Unknown.   Smoking status: Light tobacco smoker.  SMOKING STATUS Use Status Type Smoking Status Usage Per Day Years Used Total Pack Years  yes Cigarette Light tobacco smoker 3 Cigarettes            Medications (added, continued or stopped this visit):   Medication Dose Prescribed Else Ind Started Stopped  Percocet 5 mg-325 mg tablet 5 mg-325 mg Y       Allergies:  Ingredient Reaction Medication Name Comment  NO KNOWN ALLERGIES     No known allergies.  REVIEW OF SYSTEMS: System Neg/Pos Details  Constitutional Negative Chills, fatigue, fever, malaise, night sweats, weight gain and weight loss.  ENMT Negative Ear drainage,  hearing loss, nasal drainage, otalgia, sinus pressure and sore throat.  Eyes Negative Eye discharge, eye pain and vision changes.  Respiratory Negative Chronic cough, cough, dyspnea, known TB exposure and wheezing.  Cardio Negative Chest pain, claudication, edema and irregular heartbeat/palpitations.  GI Negative Abdominal pain, blood in stool, change in stool pattern, constipation, decreased appetite, diarrhea, heartburn, nausea and vomiting.  GU Negative Dysuria, hematuria, hot flashes, irregular menses, polyuria, urinary frequency, urinary incontinence and urinary retention.  Endocrine Negative Cold intolerance, heat intolerance, polydipsia and polyphagia.  Neuro Negative Dizziness, extremity weakness, gait disturbance, headache, memory impairment, numbness in extremities, seizures and tremors.  Psych Negative Anxiety, depression and insomnia.  Integumentary Negative Brittle hair, brittle nails, change in shape/size of mole(s), hair loss, hirsutism, hives, pruritus, rash and skin lesion.  MS Positive Back pain, Right wrist pain.  Hema/Lymph Negative Easy bleeding, easy bruising and lymphadenopathy.  Allergic/Immuno Negative Contact allergy, environmental allergies, food allergies and seasonal allergies.  Reproductive Negative Breast discharge, breast lump(s), dysmenorrhea, dyspareunia, history of abnormal PAP smear and vaginal discharge.  Vitals Date Temp F BP Pulse Ht In Wt Lb BMI BSA Pain Score  07/25/2013  126/72 70 67.5 138 21.29    07/25/2013    67 138.5 21.69       PHYSICAL EXAM General Level of Distress: no acute distress Overall Appearance: normal    Cardiovascular Cardiac: regular rate and rhythm without murmur  Respiratory Lungs: clear to auscultation  Neurological Recent and Remote Memory: normal Attention Span and Concentration:   normal Language: normal Fund of Knowledge: normal  Right Left Sensation: normal normal Upper Extremity  Coordination: normal normal  Lower Extremity Coordination: normal normal  Musculoskeletal Gait and Station: normal  Right Left Upper Extremity Muscle Strength: normal normal Lower Extremity Muscle Strength: normal normal Upper Extremity Muscle Tone:  normal normal Lower Extremity Muscle Tone: normal normal  Motor Strength Upper and lower extremity motor strength was tested in the clinically pertinent muscles.   Deep Tendon Reflexes  Right Left Biceps: normal normal Triceps: normal normal Brachiloradialis: normal normal Patellar: normal normal Achilles: normal normal  Sensory Sensation was tested at L1 to S1.  Cranial Nerves II. Optic Nerve/Visual Fields: normal III. Oculomotor: normal IV. Trochlear: normal V. Trigeminal: normal VI. Abducens: normal VII. Facial: normal VIII. Acoustic/Vestibular: normal IX. Glossopharyngeal: normal X. Vagus: normal XI. Spinal Accessory: normal XII. Hypoglossal: normal  Motor and other Tests Lhermittes: negative Rhomberg: negative    Right Left Hoffman's: normal normal Clonus: normal normal Babinski: normal normal SLR: negative negative Patrick's Pearlean Brownie): negative negative Toe Walk: normal normal Toe Lift: normal normal Heel Walk: normal normal SI Joint: nontender nontender   Additional Findings:  Tenderness posteriorly at the level of T12 with a slightly more prominent spinous process at this level.    DIAGNOSTIC RESULTS Approximately 25-30% height loss of T12 anteriorly due to compression fracture identified on CT and plain radiographs at the time the injury.    IMPRESSION T12 compression fracture after a fall.  We have fitted her for a TLSO brace.  She says this is quite helpful and is giving her relief of her discomfort.  Assessment/Plan # Detail Type Description   1. Assessment Closed fracture of thoracic vertebra (805.2).       2. Assessment Lumbago (724.2).        Return visit in one month with repeat  radiographs to assess interval healing.  Orders: Diagnostic Procedures: Assessment Procedure  805.2 Thoraco-lumbar Spine-AP/lateral             Provider:  Danae Orleans. Venetia Maxon  07/25/2013 03:31 PM Dictation edited by: Danae Orleans. Venetia Maxon    CC Providers: Maeola Harman [address] [place], [place] [address] ----------------------------------------------------------------------------------------------------------------------------------------------------------------------         Electronically signed by Danae Orleans. Venetia Maxon on 07/25/2013 03:31 PM

## 2013-08-27 NOTE — Op Note (Signed)
08/27/2013  3:26 PM  PATIENT:  Kimberly [name]  27 y.o. female  PRE-OPERATIVE DIAGNOSIS:  Thoracic fracture T 12 , Lumbago  POST-OPERATIVE DIAGNOSIS:  Thoracic fracture T 12 , Lumbago  PROCEDURE:  Procedure(s) with comments: Thoracic Twelve Kyphoplasty (N/A) - T12 Kyphoplasty  SURGEON:  Surgeon(s) and Role:    * Maeola Harman, MD - Primary  PHYSICIAN ASSISTANT:   ASSISTANTS: None   ANESTHESIA:   general  EBL:  Total I/O In: 1200 [I.V.:1200] Out: -   BLOOD ADMINISTERED:none  DRAINS: none   LOCAL MEDICATIONS USED:  LIDOCAINE   SPECIMEN:  No Specimen  DISPOSITION OF SPECIMEN:  N/A  COUNTS:  YES  TOURNIQUET:  * No tourniquets in log *  DICTATION: DICTATION: Patient is27 year old female with severe back pain and progressive collapse of T 12 following post-traumatic T 12 fracture which is refractory to bracing.  It was elected to take her to surgery for kyphoplasty procedure of T 12.  PROCEDURE:  Following the smooth and uncomplicated induction of general endotracheal anesthesia, the patient was placed in a prone position on chest rolls.  C-arm fluoroscopy was positioned in both the AP and lateral planes, centered on the T 12 vertebra.  His back was prepped and draped in the usual sterile fashion with Duraprep.  Using a right uni-pedicular approach, the right T 12 pedicle and vertebral body were entered with the trochar using standard landmarks.  The drill was used, followed by a 15 cc Kyphon balloon, which was used to re-expand the broken vertebra.  I was not able to reexpand the vertebra a great deal. Subsequently, 8 cc of bone cement was placed into the void created by the balloon and was seen to fill the vertebra in both the AP and lateral direction with good interdigitation and without apparent extravasation. The bone void filler was then removed.  Final X-ray demonstrated good filling within the fractured vertebra.  The incision was closed with a single 3-0 vicryl stitch  and dressed with Dermabond and an occlusive dressing. The patient was returned to the OR gurney and extubated in the OR and taken to Recovery in stable and satisfactory condition, having tolerated the procedure well.  Counts were correct at the end of the case.   PLAN OF CARE: Admit for overnight observation  PATIENT DISPOSITION:  PACU - hemodynamically stable.   Delay start of Pharmacological VTE agent (>24hrs) due to surgical blood loss or risk of bleeding: yes

## 2013-08-28 NOTE — Anesthesia Postprocedure Evaluation (Signed)
Anesthesia Post Note  Patient: Kimberly [name]  Procedure(s) Performed: Procedure(s) (LRB): Thoracic Twelve Kyphoplasty (N/A)  Anesthesia type: general  Patient location: PACU  Post pain: Pain level controlled  Post assessment: Patient's Cardiovascular Status Stable  Last Vitals:  Filed Vitals:   08/27/13 1741  BP: 106/72  Pulse: 108  Temp: 37 C  Resp: 16    Post vital signs: Reviewed and stable  Level of consciousness: sedated  Complications: No apparent anesthesia complications

## 2013-08-28 NOTE — Discharge Summary (Signed)
Physician Discharge Summary  Patient ID: AYJAH SHOW MRN: [id number] DOB/AGE: 29-22-1987 29 y.o.  Admit date: 08/27/2013 Discharge date: 08/28/2013  Admission Diagnoses:Thoracic 12 compression fracture with progressive collapse despite bracing and severe back pain  Discharge Diagnoses: Same Active Problems:   * No active hospital problems. *   Discharged Condition: good  Hospital Course: Uncomplicated T 12 Kyphoplasty procedure with relief of back pain  Consults: None  Significant Diagnostic Studies: None  Treatments: surgery: T 12 Kyphoplasty procedure   Discharge Exam: Blood pressure 106/72, pulse 108, temperature 98.6 F (37 C), temperature source Oral, resp. rate 16, height 5' 7.32" (1.71 m), weight 62.5 kg (137 lb 12.6 oz), SpO2 98.00%. Neurologic: Alert and oriented X 3, normal strength and tone. Normal symmetric reflexes. Normal coordination and gait Wound:CDI  Disposition: Home   Future Appointments Provider Department Dept Phone   11/11/2013 10:00 AM [facility] [phone]       Medication List    ASK your doctor about these medications       ALPRAZolam 1 MG tablet  Commonly known as:  XANAX  Take 1 mg by mouth 3 (three) times daily as needed for sleep.     cetirizine 10 MG tablet  Commonly known as:  ZYRTEC  Take 10 mg by mouth daily as needed for allergies.     HYDROcodone-acetaminophen 10-325 MG per tablet  Commonly known as:  NORCO  Take 1 tablet by mouth every 6 (six) hours as needed for pain.     ibuprofen 200 MG tablet  Commonly known as:  ADVIL,MOTRIN  Take 800 mg by mouth every 8 (eight) hours as needed for pain.     medroxyPROGESTERone 150 MG/ML injection  Commonly known as:  DEPO-PROVERA  Inject 1 mL (150 mg total) into the muscle every 3 (three) months.     omeprazole 40 MG capsule  Commonly known as:  PRILOSEC  Take 40 mg by mouth daily as needed (for acid reflux).     oxyCODONE-acetaminophen 5-325 MG per tablet  Commonly known as:  PERCOCET/ROXICET  Take 1 tablet by mouth every 4 (four) hours as needed for pain.     promethazine 25 MG tablet  Commonly known as:  PHENERGAN  Take 25 mg by mouth every 6 (six) hours as needed for nausea.         Signed: Dorian Heckle, MD 08/28/2013, 7:44 AM

## 2013-08-29 ENCOUNTER — Encounter (HOSPITAL_COMMUNITY): Payer: Self-pay | Admitting: Neurosurgery

## 2013-10-01 ENCOUNTER — Ambulatory Visit (INDEPENDENT_AMBULATORY_CARE_PROVIDER_SITE_OTHER): Payer: BC Managed Care – PPO | Admitting: Family Medicine

## 2013-10-01 VITALS — BP 112/78 | HR 78 | Temp 98.9°F | Resp 18 | Ht 67.0 in | Wt 137.0 lb

## 2013-10-01 DIAGNOSIS — J029 Acute pharyngitis, unspecified: Secondary | ICD-10-CM | POA: Insufficient documentation

## 2013-10-01 LAB — RAPID STREP SCREEN (MED CTR MEBANE ONLY): Streptococcus, Group A Screen (Direct): NEGATIVE

## 2013-10-01 MED ORDER — AMOXICILLIN 500 MG PO CAPS: 500.0000 mg | ORAL_CAPSULE | Freq: Three times a day (TID) | ORAL | Status: DC

## 2013-10-01 NOTE — Assessment & Plan Note (Signed)
Based on presentation as well as examination I am concerned for bacterial infection. I will go ahead and put her on amoxicillin

## 2013-10-01 NOTE — Patient Instructions (Signed)
Salt water gargle Antibiotics F/U as needed

## 2013-10-01 NOTE — Progress Notes (Signed)
  Subjective:    Patient ID: Kimberly [name], female    DOB: 1986-04-22, 27 y.o.   MRN: [id number]  HPI Patient here with sore throat and painful swallowing for the past 5 days. She's not had any fever. She's not had any sinus drainage. No sick contacts. Rapid strep done in the office is negative. She's also not had any cough. She has had mild headache.     Review of Systems - per above   GEN- denies fatigue, fever, weight loss,weakness, recent illness HEENT- denies eye drainage, change in vision, nasal discharge, CVS- denies chest pain, palpitations RESP- denies SOB, cough, wheeze Neuro- + headache, dizziness, syncope, seizure activity      Objective:   Physical Exam  GEN- NAD, alert and oriented x3 HEENT- PERRL, EOMI, non injected sclera, pink conjunctiva, MMM, oropharyx +injection, +tonsilar enlargement, few exudates TM clear bilat no effusion, no maxillary sinus tenderness, nares clear Neck- Supple,shotty anterior LAD CVS- RRR, no murmur RESP-CTAB EXT- No edema Pulses- Radial 2+         Assessment & Plan:

## 2013-11-11 ENCOUNTER — Ambulatory Visit (INDEPENDENT_AMBULATORY_CARE_PROVIDER_SITE_OTHER): Payer: BC Managed Care – PPO | Admitting: *Deleted

## 2013-11-11 DIAGNOSIS — Z23 Encounter for immunization: Secondary | ICD-10-CM

## 2013-11-11 DIAGNOSIS — Z3049 Encounter for surveillance of other contraceptives: Secondary | ICD-10-CM

## 2013-11-11 MED ORDER — MEDROXYPROGESTERONE ACETATE 150 MG/ML IM SUSP
150.0000 mg | Freq: Once | INTRAMUSCULAR | Status: AC
  Administered 2013-11-11: 150 mg via INTRAMUSCULAR

## 2013-12-12 ENCOUNTER — Encounter: Payer: Self-pay | Admitting: General Surgery

## 2014-02-05 ENCOUNTER — Other Ambulatory Visit (INDEPENDENT_AMBULATORY_CARE_PROVIDER_SITE_OTHER): Payer: BC Managed Care – PPO | Admitting: *Deleted

## 2014-02-05 DIAGNOSIS — Z3049 Encounter for surveillance of other contraceptives: Secondary | ICD-10-CM

## 2014-02-05 DIAGNOSIS — Z3042 Encounter for surveillance of injectable contraceptive: Secondary | ICD-10-CM

## 2014-02-05 MED ORDER — MEDROXYPROGESTERONE ACETATE 150 MG/ML IM SUSP: 150.0000 mg | INTRAMUSCULAR | Status: DC

## 2014-02-05 MED ORDER — MEDROXYPROGESTERONE ACETATE 150 MG/ML IM SUSP
150.0000 mg | INTRAMUSCULAR | Status: AC
  Administered 2014-02-05: 150 mg via INTRAMUSCULAR

## 2014-02-05 NOTE — Progress Notes (Signed)
Patient is here for Depo Provera Injection.

## 2014-04-22 ENCOUNTER — Telehealth: Payer: Self-pay | Admitting: *Deleted

## 2014-04-22 DIAGNOSIS — IMO0001 Reserved for inherently not codable concepts without codable children: Secondary | ICD-10-CM

## 2014-04-22 MED ORDER — NORGESTIMATE-ETH ESTRADIOL 0.25-35 MG-MCG PO TABS: 1.0000 | ORAL_TABLET | Freq: Every day | ORAL | Status: DC

## 2014-04-22 NOTE — Telephone Encounter (Signed)
Patient called and wanted to switch to birth control pills.  I have sent in pills to patients pharmacy.  I have told patient she is due for an annual and needs to get one scheduled.

## 2014-05-01 IMAGING — US US[facility]FM FETAL BPP W/O NON-STRESS ADDL GEST
1 series · 12 of 20 positions shown · non-contrast
Comparison: none

[Series 1: usmfm fetal bpp w/o non-stress addl gest · 0.23mm/px · 12 of 20 slices shown]
[im 1/20]
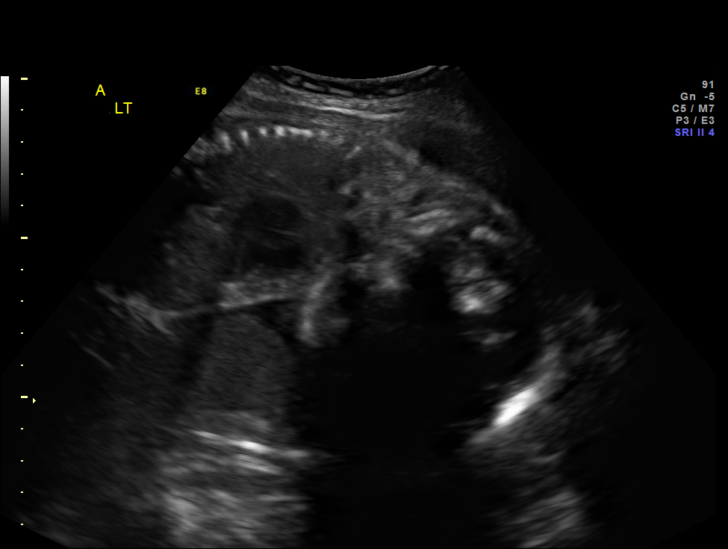
[im 3/20]
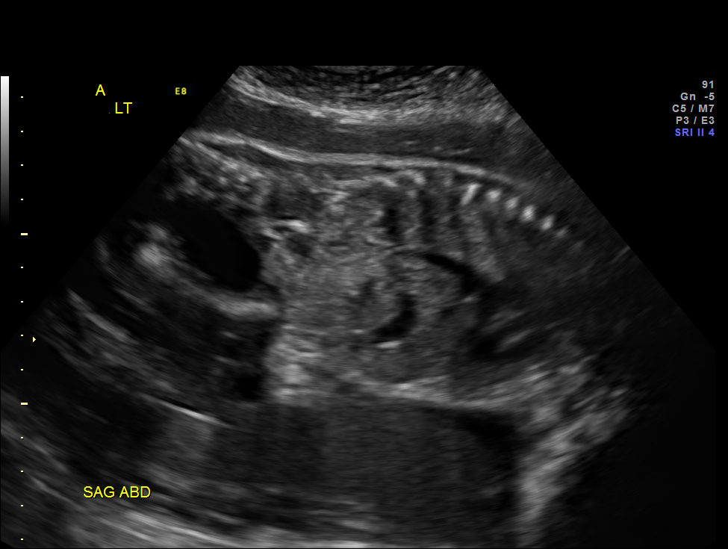
[im 5/20]
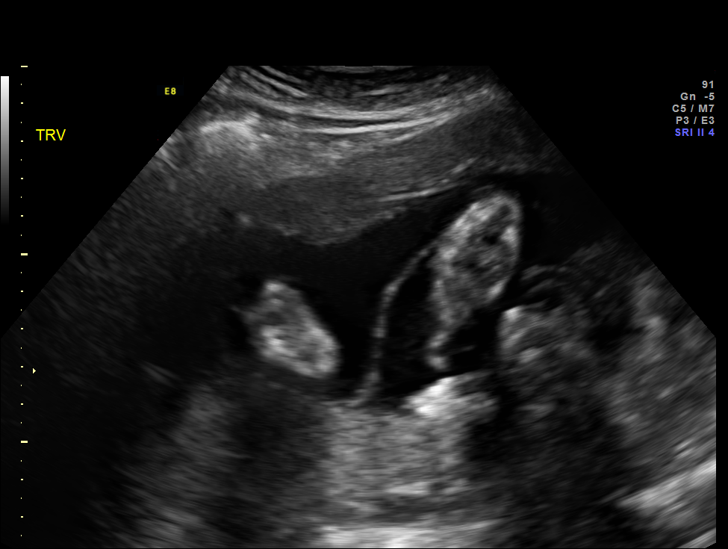
[im 6/20]
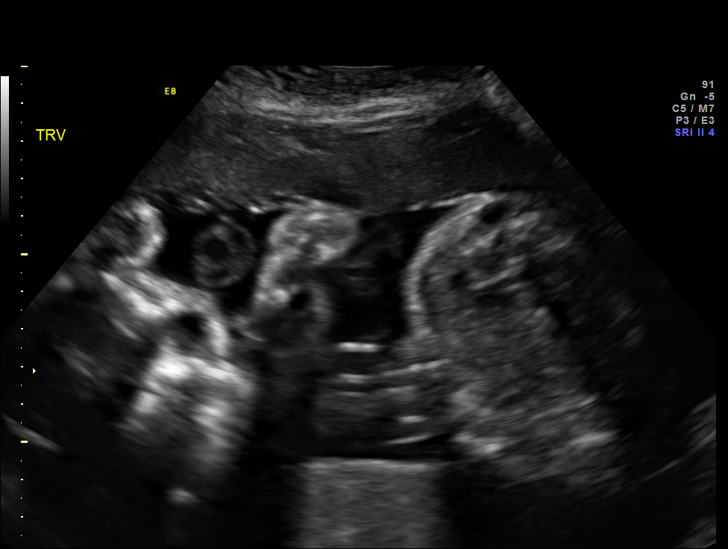
[im 8/20]
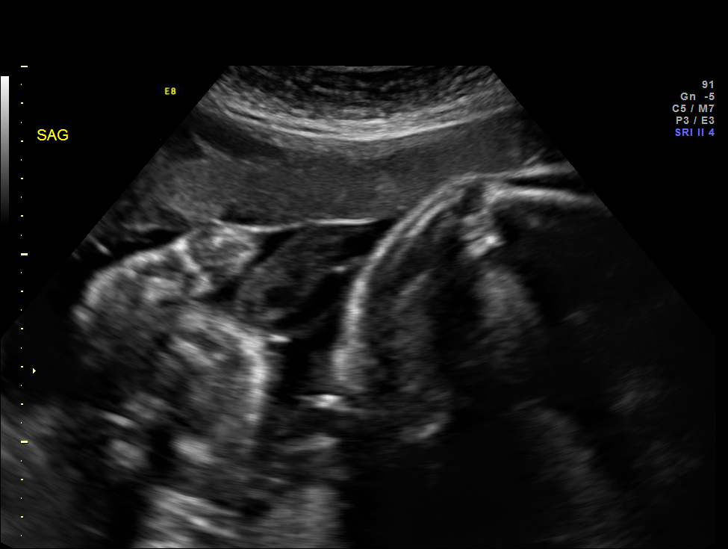
[im 10/20]
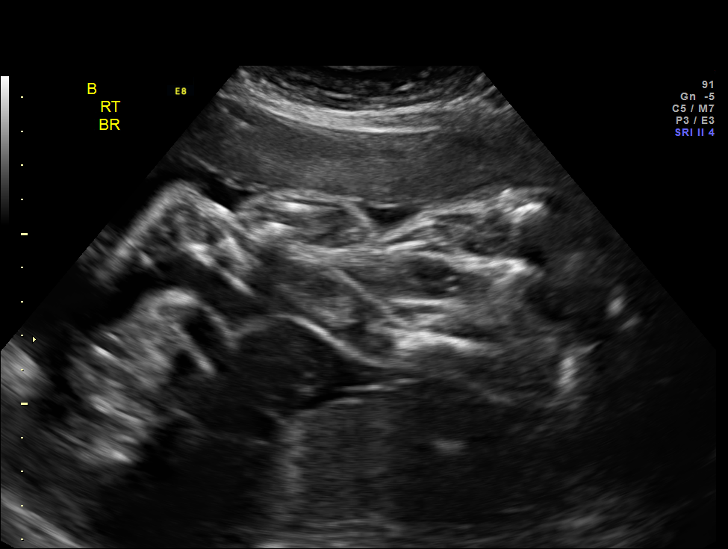
[im 11/20]
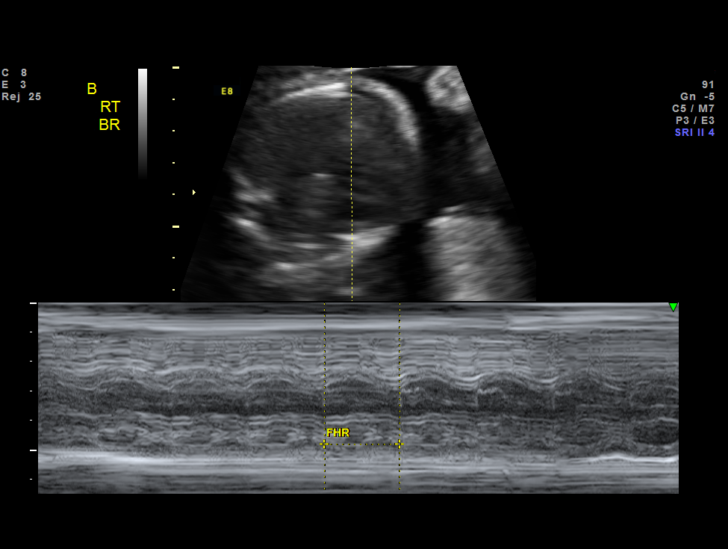
[im 13/20]
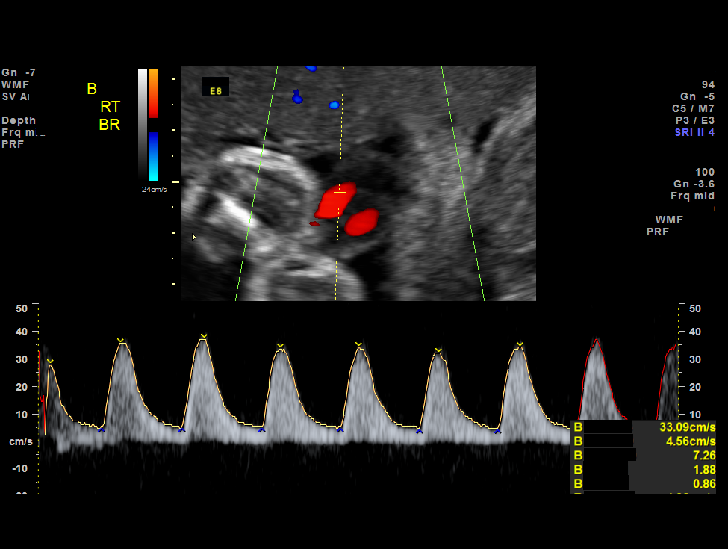
[im 15/20]
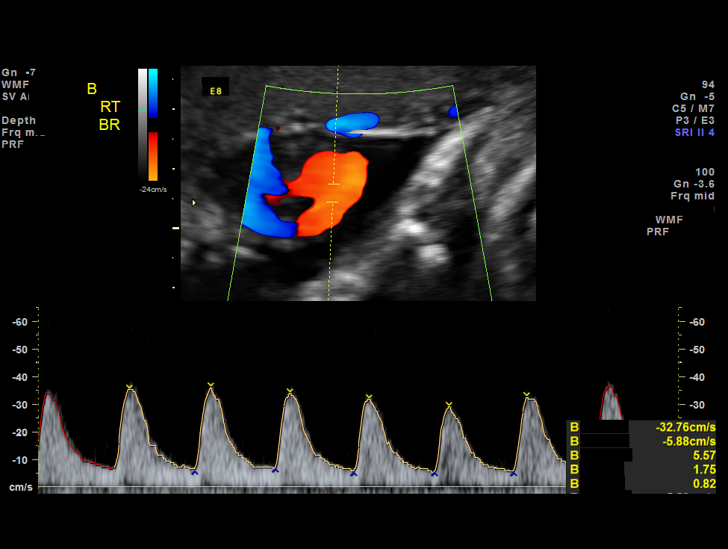
[im 16/20]
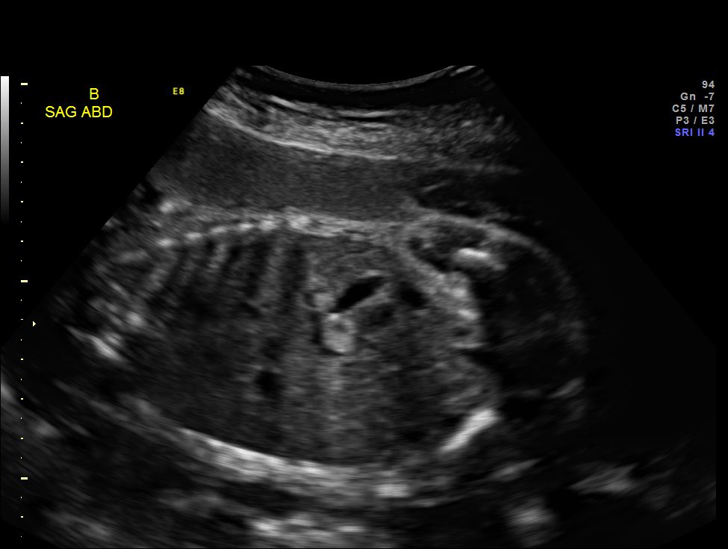
[im 18/20]
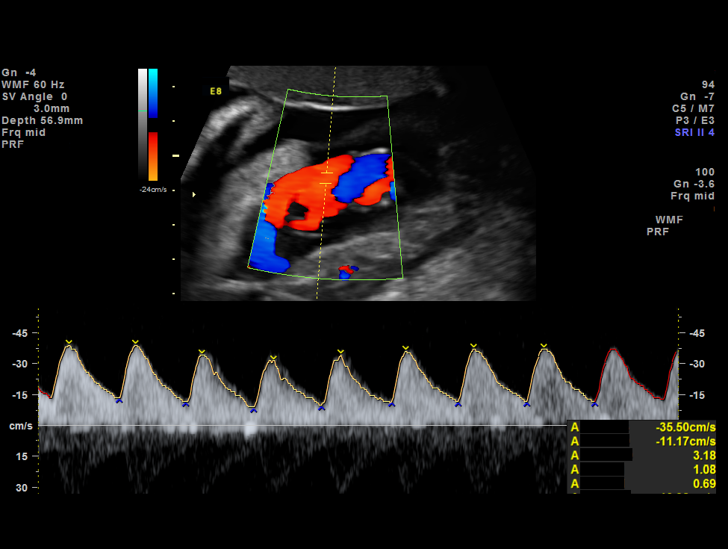
[im 20/20]
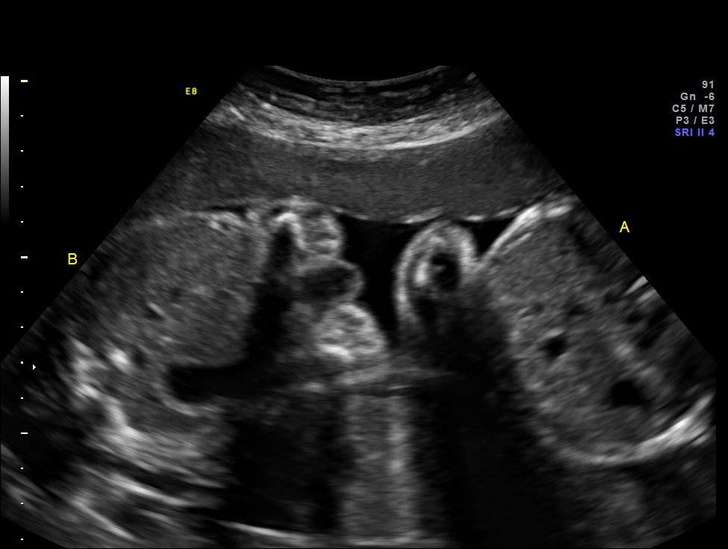

[12 of 20 positions shown; findings below may reference images not displayed]

OBSTETRICS REPORT
                      (Signed Final 07/30/2012 [DATE])

Service(s) Provided

 US UA CORD DOPPLER                                    76827.2
 US UA ADDL GEST                                       76827.3
Indications

 Twin gestation, Di-Di
 Hyperthyroidism on methimazole
 Size less than dates (Small for gestational [AGE]
 FGR) - Twin B
 Assess fetal well being
Fetal Evaluation (Fetus A)

 Num Of Fetuses:    2
 Fetal Heart Rate:  155                          bpm
 Cardiac Activity:  Observed
 Fetal Lie:         Lower left Fetus
 Presentation:      Cephalic
 Placenta:          Posterior, above cervical
                    os
 P. Cord            Previously Visualized
 Insertion:

 Membrane Desc:     Dividing
                    Membrane seen
                    - Dichorionic.

 Amniotic Fluid
 AFI FV:      Subjectively within normal limits
                                             Larg Pckt:     4.3  cm
Biophysical Evaluation (Fetus A)

 Amniotic F.V:   Pocket => 2 cm two         F. Tone:        Observed
                 planes
 F. Movement:    Observed                   Score:          [DATE]
 F. Breathing:   Not Observed
Gestational Age (Fetus A)

 LMP:           30w 0d        Date:  01/02/12                 EDD:   10/08/12
 Best:          30w 0d     Det. By:  LMP  (01/02/12)          EDD:   10/08/12
Doppler - Fetal Vessels (Fetus A)

 Umbilical Artery
 S/D:   3.17           64  %tile       RI:
 PI:    1.08                           PSV:       36.44   cm/s
 Umbilical Artery
 Absent DFV:    No     Reverse DFV:    No

Fetal Evaluation (Fetus B)

 Num Of Fetuses:    2
 Fetal Heart Rate:  134                          bpm
 Cardiac Activity:  Observed
 Fetal Lie:         Upper right Fetus
 Presentation:      Frank breech
 Placenta:          Anterior, above cervical os
 P. Cord            Previously Visualized
 Insertion:

 Membrane Desc:     Dividing
                    Membrane seen
                    - Dichorionic.

 Amniotic Fluid
 AFI FV:      Subjectively within normal limits
                                             Larg Pckt:     5.0  cm
Biophysical Evaluation (Fetus B)

 Amniotic F.V:   Pocket => 2 cm two         F. Tone:        Observed
                 planes
 F. Movement:    Observed                   Score:          [DATE]
 F. Breathing:   Not Observed
Gestational Age (Fetus B)

 LMP:           30w 0d        Date:  01/02/12                 EDD:   10/08/12
 Best:          30w 0d     Det. By:  LMP  (01/02/12)          EDD:   10/08/12
Doppler - Fetal Vessels (Fetus B)

 Umbilical Artery
 S/D:   6.71       > 97.5  %tile       RI:
 PI:    1.81                           PSV:       44.34   cm/s
 Umbilical Artery
 Absent DFV:    No     Reverse DFV:    No

Impression

 Active dichorionic diamniotic twins.
 No apparent dysmorphic features are identified in either fetus
 on limited anatomic re-examination.
 The amniotic fluid volumes are concordant.

 Twin A:  The umbilical artery Doppler S/D is normal for the
 assigned gestational age.  BPP is [DATE] (-2 no breathing and -
 2 nonreactive NST)
 Twin B:  The umbilical artery Doppler S/D is >2SD above the
 mean for the assigned gestational age.  There is persistent
 diastolic flow and no reversal of diastolic flow.  BPP is [DATE] (-2
 no breathing and -2 nonreactive NST)
Recommendations

 1. The patient was sent to L&D for prolonged monitoring of 4-
 6 hours with repeat BPP after 4-6 hours of reassuring
 monitoring.  If [DATE] or better in both fetuses, then I
 recommend continued antenatal surveillance by twice weekly
 BPP.
 2. Dr. Locklear of labor and delivery was given message of the
 patient's needed evaluation.

## 2014-05-07 ENCOUNTER — Ambulatory Visit: Payer: BC Managed Care – PPO

## 2014-05-15 IMAGING — US US FETAL BPP W/O NONSTRESS
1 series · 14 of 28 positions shown · non-contrast
Comparison: none

[Series 1: us fetal bpp w/o nonstress · 0.19mm/px · 64 acquisitions, 14 frames shown]
[im 3/64]
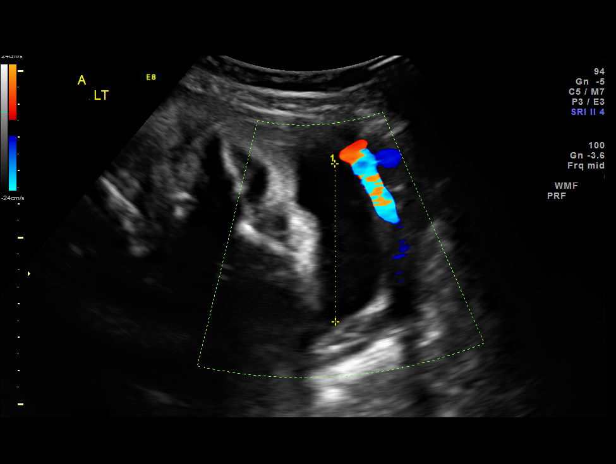
[im 8/64]
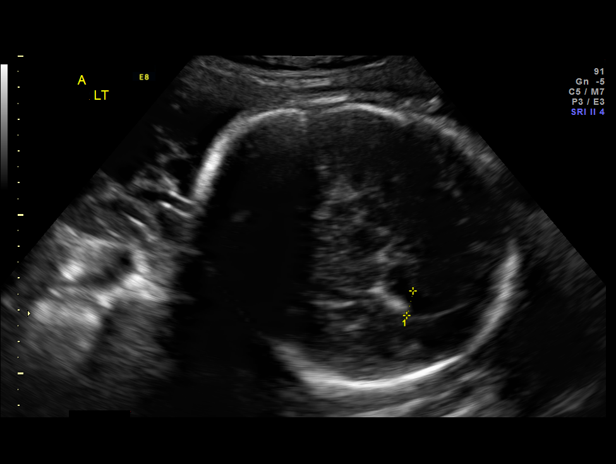
[im 12/64]
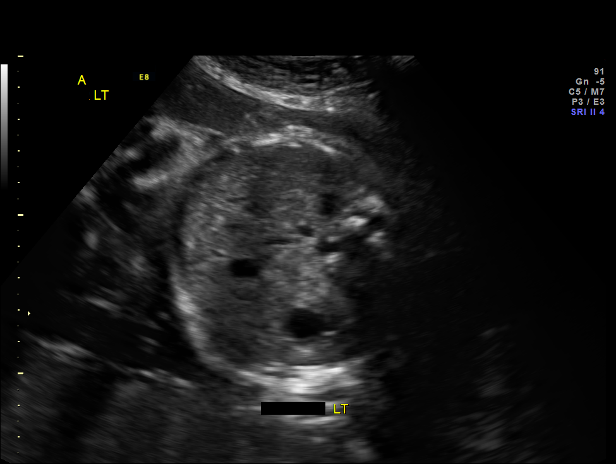
[im 17/64]
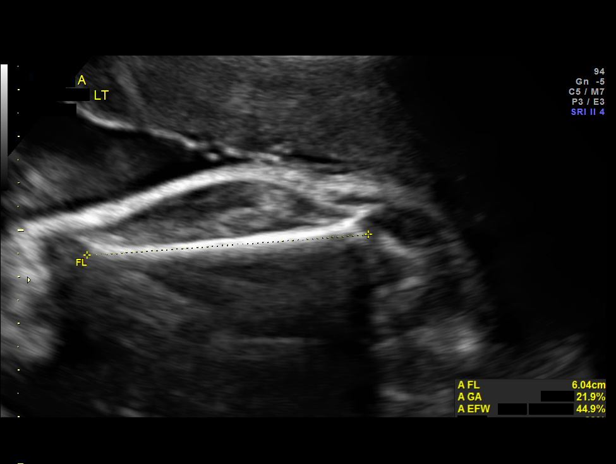
[im 22/64]
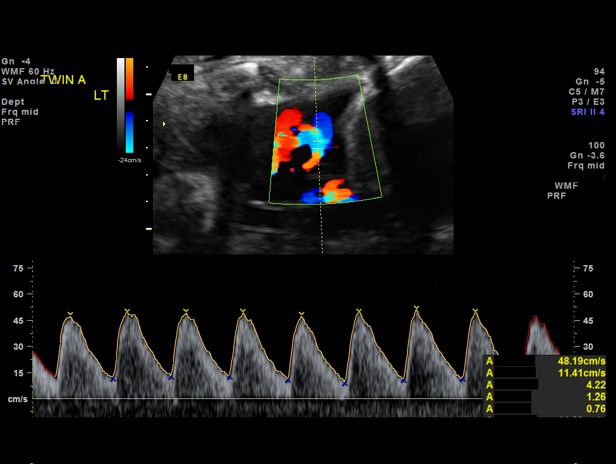
[im 26/64]
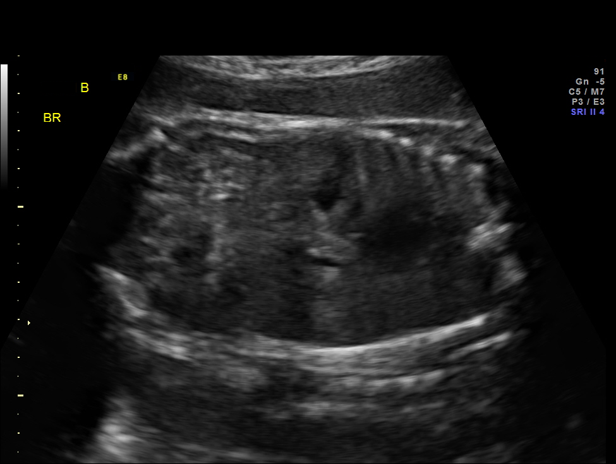
[im 31/64]
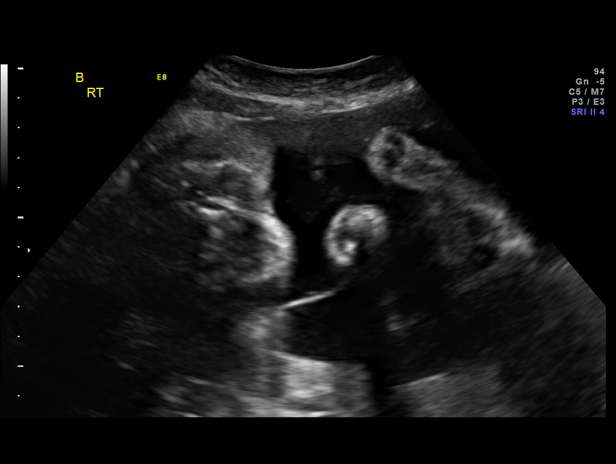
[im 36/64]
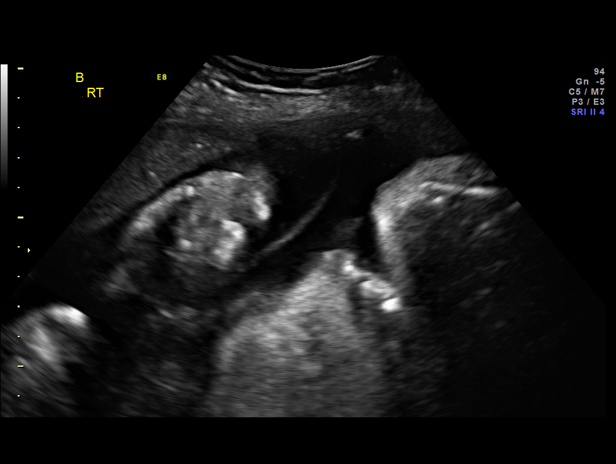
[im 40/64]
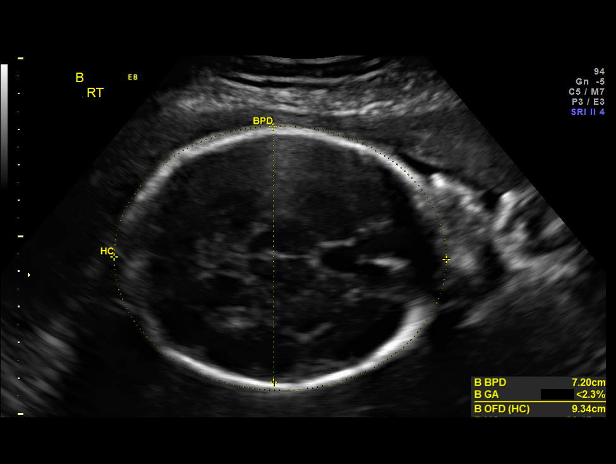
[im 45/64]
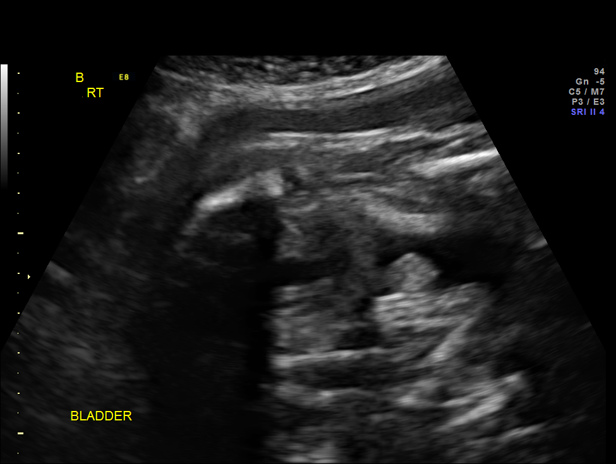
[im 50/64]
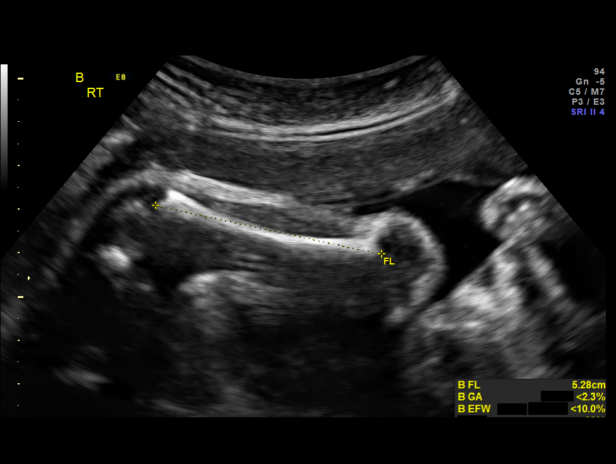
[im 54/64]
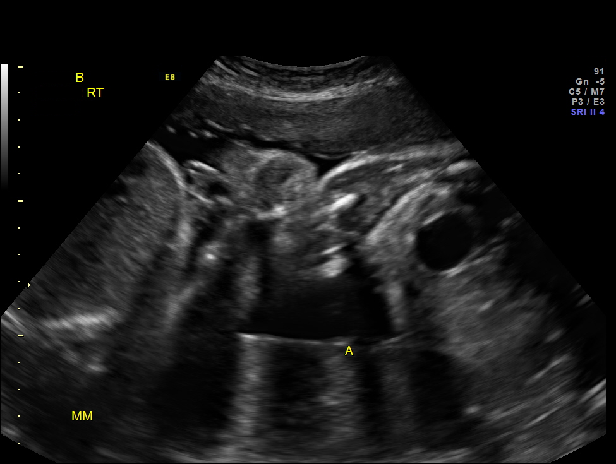
[im 59/64]
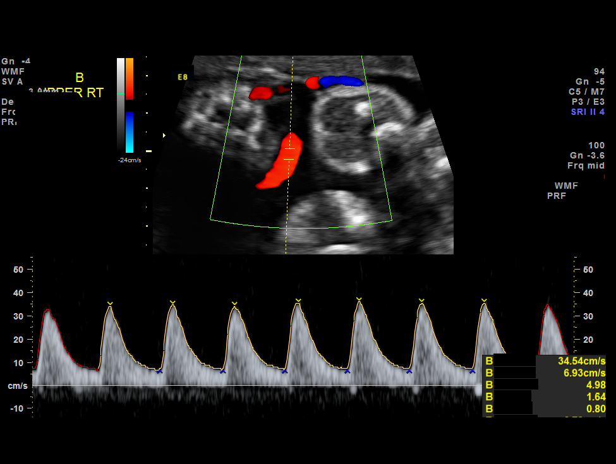
[im 64/64]
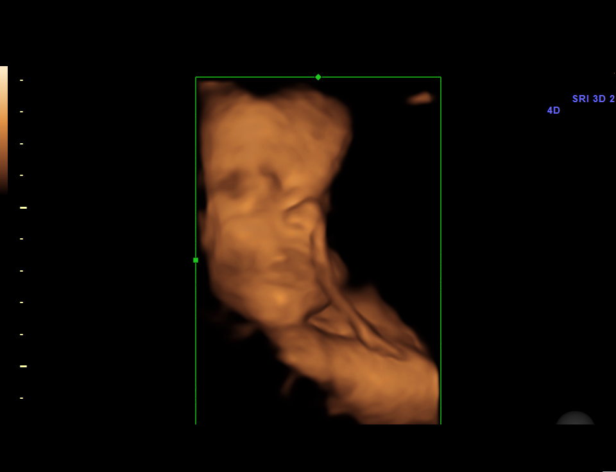

[14 of 28 positions shown; findings below may reference images not displayed]

OBSTETRICS REPORT
                      (Signed Final 08/13/2012 [DATE])

Service(s) Provided

 US UA DOPPLER RE-EVAL                                 76828.1
 US UA DOPPLER ADDL GEST RE EVAL                       76828.2
 US OB FOLLOW UP                                       76816.1
 US OB FOLLOW UP ADDL GEST                             76816.2
Indications

 Twin gestation, Di-Di
 Hyperthyroidism on methimazole
 Size less than dates (Small for gestational [AGE]
 FGR) - Twin B
 Assess fetal well being
 Assess Fetal Growth / Estimated Fetal Weight
 S/P course of BMZ
Fetal Evaluation (Fetus A)

 Num Of Fetuses:    2
 Fetal Heart Rate:  135                          bpm
 Cardiac Activity:  Observed
 Fetal Lie:         Lower left Fetus
 Presentation:      Cephalic
 Placenta:          Posterior, above cervical
                    os
 P. Cord            Previously Visualized
 Insertion:

 Membrane Desc:     Dividing
                    Membrane seen
                    - Dichorionic.

 Amniotic Fluid
 AFI FV:      Subjectively within normal limits
                                             Larg Pckt:     4.7  cm
Biophysical Evaluation (Fetus A)

 Amniotic F.V:   Pocket => 2 cm two         F. Tone:        Observed
                 planes
 F. Movement:    Observed                   Score:          [DATE]
 F. Breathing:   Observed
Biometry (Fetus A)

 BPD:       85  mm     G. Age:  34w 1d                CI:         79.4   70 - 86
 OFD:    107.1  mm                                    FL/HC:      19.5   19.1 -

 HC:     308.9  mm     G. Age:  34w 3d       78  %    HC/AC:      1.18   0.96 -

 AC:     262.6  mm     G. Age:  30w 3d       10  %    FL/BPD:     70.7   71 - 87
 FL:      60.1  mm     G. Age:  31w 2d       20  %    FL/AC:      22.9   20 - 24
 HUM:     52.2  mm     G. Age:  30w 3d       19  %

 Est. FW:    6244  gm    3 lb 14 oz      44  %     FW Discordancy     0 \ 35 %
Gestational Age (Fetus A)

 LMP:           32w 0d        Date:  01/02/12                 EDD:   10/08/12
 U/S Today:     32w 4d                                        EDD:   10/04/12
 Best:          32w 0d     Det. By:  LMP  (01/02/12)          EDD:   10/08/12
Anatomy (Fetus A)

 Cranium:          Appears normal         Ductal Arch:      Previously seen
 Fetal Cavum:      Appears normal         Diaphragm:        Previously seen
 Ventricles:       Appears normal         Stomach:          Appears normal, left
                                                            sided
 Choroid Plexus:   Previously seen        Abdomen:          Appears normal
 Cerebellum:       Previously seen        Abdominal Wall:   Previously seen
 Posterior Fossa:  Previously seen        Cord Vessels:     Previously seen
 Nuchal Fold:      Previously seen        Kidneys:          Appear normal
 Face:             Previously seen        Bladder:          Appears normal
 Lips:             Previously seen        Spine:            Previously seen
 Heart:            Appears normal         Lower             Previously seen
                   (4CH, axis, and        Extremities:
                   situs)
 RVOT:             Previously seen        Upper             Previously seen
                                          Extremities:
 LVOT:             Previously seen        Limbs:            Previously seen
 Aortic Arch:      Previously seen

 Other:  Male gender. 5th digit previously seen.
Doppler - Fetal Vessels (Fetus A)

 Umbilical Artery
 S/D:   3.68           91  %tile       RI:
 PI:    1.16                           PSV:       48.19   cm/s
 Umbilical Artery
 Absent DFV:    No     Reverse DFV:    No

Fetal Evaluation (Fetus B)

 Num Of Fetuses:    2
 Fetal Heart Rate:  134                          bpm
 Cardiac Activity:  Observed
 Fetal Lie:         Upper right Fetus
 Presentation:      Cephalic
 Placenta:          Anterior, above cervical os
 P. Cord            Previously Visualized
 Insertion:
 Membrane Desc:     Dividing
                    Membrane seen
                    - Dichorionic.

 Amniotic Fluid
 AFI FV:      Subjectively within normal limits
                                             Larg Pckt:     5.2  cm
Biophysical Evaluation (Fetus B)

 Amniotic F.V:   Pocket => 2 cm two         F. Tone:        Observed
                 planes
 F. Movement:    Observed                   Score:          [DATE]
 F. Breathing:   Observed
Biometry (Fetus B)

 BPD:     71.5  mm     G. Age:  28w 5d                CI:         76.6   70 - 86
 OFD:     93.4  mm                                    FL/HC:      20.1   19.1 -

 HC:     263.5  mm     G. Age:  28w 5d      < 3  %    HC/AC:      1.15   0.96 -

 AC:     229.6  mm     G. Age:  27w 3d      < 3  %    FL/BPD:     74.0   71 - 87
 FL:      52.9  mm     G. Age:  28w 1d      < 3  %    FL/AC:      23.0   20 - 24
 HUM:     48.6  mm     G. Age:  28w 4d      < 5  %

 Est. FW:    1111  gm      2 lb 8 oz   < 10  %     FW Discordancy        35  %
Gestational Age (Fetus B)

 LMP:           32w 0d        Date:  01/02/12                 EDD:   10/08/12
 U/S Today:     28w 2d                                        EDD:   11/03/12
 Best:          32w 0d     Det. By:  LMP  (01/02/12)          EDD:   10/08/12
Anatomy (Fetus B)

 Cranium:          Appears normal         Ductal Arch:      Previously seen
 Fetal Cavum:      Appears normal         Diaphragm:        Previously seen
 Ventricles:       Appears normal         Stomach:          Appears normal, left
                                                            sided
 Choroid Plexus:   Previously seen        Abdomen:          Appears normal
 Cerebellum:       Previously seen        Abdominal Wall:   Previously seen
 Posterior Fossa:  Previously seen        Cord Vessels:     Previously seen
 Nuchal Fold:      Previously seen        Kidneys:          Appear normal
 Face:             Previously seen        Bladder:          Appears normal
 Lips:             Previously seen        Spine:            Previously seen
 Heart:            Appears normal         Lower             Previously seen
                   (4CH, axis, and        Extremities:
                   situs)
 RVOT:             Previously seen        Upper             Previously seen
                                          Extremities:
 LVOT:             Previously seen        Limbs:            Previously seen
 Aortic Arch:      Previously seen

 Other:  Male gender. 5th digit previously seen.
Doppler - Fetal Vessels (Fetus B)

 Umbilical Artery
 S/D:   7          > 97.5  %tile       RI:
 PI:    1.83                           PSV:       47.62   cm/s
 Umbilical Artery
 Absent DFV:    No     Reverse DFV:    No

Cervix Uterus Adnexa

 Cervix:       Not visualized (advanced GA >74wks)
Impression

 Dichorionic/diamniotic twin pregnancy at 32+0 weeks
 Normal interval anatomy; anatomic survey complete; Twin A:
 mild left-sided pyelectasis
 Normal amniotic fluid volume x 2
 Twin A: appropriate interval growth with EFW at the 44th %tile
 Twin B: EFW < 10th %tile; overall, has grown 2 weeks in the
 past 3 weeks; 
 250 grams
 Twin A: UA dopplers were in the high normal range for this
 GA
 Twin B: UA dopplers were elevated for this GA; continuous
 diastolic flow
 BPP [DATE] x 2
Recommendations

 Continue twice weekly testing
 Growth in 2-3 weeks

## 2014-06-03 ENCOUNTER — Emergency Department (INDEPENDENT_AMBULATORY_CARE_PROVIDER_SITE_OTHER)
Admission: EM | Admit: 2014-06-03 | Discharge: 2014-06-03 | Disposition: A | Discharge status: Home / Self Care | Payer: PRIVATE HEALTH INSURANCE | Source: Home / Self Care

## 2014-06-03 ENCOUNTER — Encounter (HOSPITAL_COMMUNITY): Payer: Self-pay | Admitting: Emergency Medicine

## 2014-06-03 ENCOUNTER — Emergency Department (INDEPENDENT_AMBULATORY_CARE_PROVIDER_SITE_OTHER): Payer: PRIVATE HEALTH INSURANCE

## 2014-06-03 DIAGNOSIS — R1031 Right lower quadrant pain: Secondary | ICD-10-CM

## 2014-06-03 LAB — POCT URINALYSIS DIP (DEVICE)
Glucose, UA: NEGATIVE mg/dL
KETONES UR: NEGATIVE mg/dL
LEUKOCYTES UA: NEGATIVE
Nitrite: NEGATIVE
Protein, ur: 100 mg/dL — AB
Specific Gravity, Urine: >=1.03 (ref 1.005–1.030)
UROBILINOGEN UA: 0.2 mg/dL (ref 0.0–1.0)
pH: 5.5 (ref 5.0–8.0)

## 2014-06-03 LAB — POCT PREGNANCY, URINE: Preg Test, Ur: NEGATIVE

## 2014-06-03 NOTE — ED Notes (Signed)
Reports abdominal pain for 3 days.  Patient reports having a "period" Saturday and Sunday.  Patient reports this is the first period since switching from depo shot to oral pill.  Patient's last depo shot was April.  Abdominal pain is right abdomen.  Pain described as cramping.  Has nausea, no vomiting, and does have diarrhea.

## 2014-06-03 NOTE — ED Provider Notes (Signed)
Medical screening examination/treatment/procedure(s) were performed by resident physician or non-physician practitioner and as supervising physician I was immediately available for consultation/collaboration.   Pauline Good MD.   Billy Fischer, MD 06/03/14 [phone]

## 2014-06-03 NOTE — ED Provider Notes (Signed)
CSN: [id number]     Arrival date & time 06/03/14  1243 History   First MD Initiated Contact with Patient 06/03/14 1324     Chief Complaint  Patient presents with  . Abdominal Pain   (Consider location/radiation/quality/duration/timing/severity/associated sxs/prior Treatment) HPI Comments: C/o abdominal pain for 3 d. Located RLQ. Constant and getting worse. N no vomiting. Diarrhea for past 3 d. Low grade fever at home.  Hx of constipation predominate IBS.   Past Medical History  Diagnosis Date  . HSV-1 infection   . Abnormal Pap smear   . Anxiety   . Headache(784.0)   . Hyperthyroidism   . IBS (irritable bowel syndrome)   . Tachycardia   . Family history of anesthesia complication     mother has difficult  time going to sleep  . ADD (attention deficit disorder)    Past Surgical History  Procedure Laterality Date  . Wisdom tooth extraction      x4  . Cesarean section  09/13/2012    Procedure: CESAREAN SECTION;  Surgeon: Lavonia Drafts, MD;  Location: [facility];  Service: Obstetrics;  Laterality: N/A;  . Wrist surgery Right 07-23-2013  . Kyphoplasty N/A 08/27/2013    Procedure: Thoracic Twelve Kyphoplasty;  Surgeon: Erline Levine, MD;  Location: [facility];  Service: Neurosurgery;  Laterality: N/A;  T12 Kyphoplasty   Family History  Problem Relation Age of Onset  . Depression Mother     chronic  . Hearing loss Mother   . Hyperlipidemia Mother   . Vision loss Mother     beachets disease  . COPD Father   . Diabetes Father   . Cancer Maternal Grandmother 68    breast cancer  . Parkinsonism Maternal Grandmother   . Heart disease Maternal Grandmother   . Alzheimer's disease Paternal Grandmother   . Heart disease Paternal Grandfather     Died age 65  . Nephrolithiasis Paternal Grandfather   . Cancer Other 60    breast   History  Substance Use Topics  . Smoking status: Former Smoker -- 1.00 packs/day for 10 years    Types: Cigarettes    Quit date: 01/31/2012  .  Smokeless tobacco: Never Used  . Alcohol Use: No   OB History   Grav Para Term Preterm Abortions TAB SAB Ect Mult Living   1 1 0 1 0 0 0 0 1 2      Review of Systems  Constitutional: Positive for fever, activity change and appetite change.  HENT: Negative.   Respiratory: Negative.   Cardiovascular: Negative for chest pain and palpitations.  Gastrointestinal: Positive for nausea, abdominal pain, diarrhea and constipation. Negative for vomiting, blood in stool and abdominal distention.  Genitourinary: Negative for dysuria, urgency, frequency, flank pain, vaginal discharge and pelvic pain.       Recent menstrual flow, irregular during change over to OCP's  Musculoskeletal: Negative.   Neurological: Negative.     Allergies  Review of patient's allergies indicates no known allergies.  Home Medications   Prior to Admission medications   Medication Sig Start Date End Date Taking? Authorizing Provider  ALPRAZolam Duanne Moron) 1 MG tablet Take 1 mg by mouth 3 (three) times daily as needed for sleep.    Historical Provider, MD  amoxicillin (AMOXIL) 500 MG capsule Take 1 capsule (500 mg total) by mouth 3 (three) times daily. 10/01/13   Alycia Rossetti, MD  cetirizine (ZYRTEC) 10 MG tablet Take 10 mg by mouth daily as needed for allergies.  Historical Provider, MD  HYDROcodone-acetaminophen (NORCO) 10-325 MG per tablet Take 1 tablet by mouth every 6 (six) hours as needed for pain.    Historical Provider, MD  ibuprofen (ADVIL,MOTRIN) 200 MG tablet Take 800 mg by mouth every 8 (eight) hours as needed for pain.    Historical Provider, MD  medroxyPROGESTERone (DEPO-PROVERA) 150 MG/ML injection Inject 1 mL (150 mg total) into the muscle every 3 (three) months. 02/05/14   Osborne Oman, MD  norgestimate-ethinyl estradiol (ORTHO-CYCLEN,SPRINTEC,PREVIFEM) 0.25-35 MG-MCG tablet Take 1 tablet by mouth daily. 04/22/14   Donnamae Jude, MD  omeprazole (PRILOSEC) 40 MG capsule Take 40 mg by mouth daily as  needed (for acid reflux).    Historical Provider, MD  promethazine (PHENERGAN) 25 MG tablet Take 25 mg by mouth every 6 (six) hours as needed for nausea.    Historical Provider, MD   BP 116/82  Pulse 90  Temp(Src) 98.6 F (37 C) (Oral)  Resp 16  SpO2 100%  LMP 05/31/2014 Physical Exam  Nursing note and vitals reviewed. Constitutional: She is oriented to person, place, and time. She appears well-developed and well-nourished. No distress.  Neck: Normal range of motion. Neck supple.  Cardiovascular: Normal rate, regular rhythm, normal heart sounds and intact distal pulses.   Pulmonary/Chest: Effort normal and breath sounds normal. No respiratory distress. She has no wheezes.  Abdominal: Soft. She exhibits no distension and no mass. There is tenderness. There is no rebound and no guarding.  Tenderness RLQ and R mid abdomen. Subsequent exams differ in exact location, intensity and presence of tenderness. Percussion dullness all quads.  Musculoskeletal: She exhibits no edema.  Neurological: She is alert and oriented to person, place, and time.  Skin: Skin is warm and dry.  Psychiatric: She has a normal mood and affect.    ED Course  Procedures (including critical care time) Labs Review Labs Reviewed  POCT URINALYSIS DIP (DEVICE) - Abnormal; Notable for the following:    Bilirubin Urine SMALL (*)    Hgb urine dipstick LARGE (*)    Protein, ur 100 (*)    All other components within normal limits  POCT PREGNANCY, URINE    Imaging Review Dg Abd 1 View  06/03/2014   CLINICAL DATA:  Nausea diarrhea and abdominal pain for 3 days  EXAM: ABDOMEN - 1 VIEW  COMPARISON:  None.  FINDINGS: The bowel gas pattern is unremarkable. There are no abnormal soft tissue calcifications. The patient has undergone previous kyphoplasty at T12.  IMPRESSION: There is no plain film evidence of acute intra-abdominal abnormality.   Electronically Signed   By: Casyn Becvar  [place]   On: 06/03/2014 14:06     MDM   1.  Right lower quadrant abdominal pain    Pt is feeling better at time of discharge. No active pain. No GI sx's. Afebrile. We discussed in detail the possible diagnoses and warning s and s. Constipation, gas pain and or colonic cramping, appendicitis. The latter is less likely with her exam and presentation.  She was given 2 options: To be transferred to the ED or Home and self monitor, with detailed and repeated signs and sx's of appendicitis or other conditions that warrants further management. SHe declined to go to the ED anyway.She wants to go home and self monitor.  No meds. No heavy meals. Liquids and bland diet for now.      Janne Napoleon, NP 06/03/14 1513

## 2014-06-03 NOTE — Discharge Instructions (Signed)
Abdominal Pain Light diet, mostly fluids.  Watch temperature, take note of intensity and location of pain.  For any changes or worsening get to the Emergency Dept promptly. Many things can cause abdominal pain. Usually, abdominal pain is not caused by a disease and will improve without treatment. It can often be observed and treated at home. Your health care provider will do a physical exam and possibly order blood tests and X-rays to help determine the seriousness of your pain. However, in many cases, more time must pass before a clear cause of the pain can be found. Before that point, your health care provider may not know if you need more testing or further treatment. HOME CARE INSTRUCTIONS  Monitor your abdominal pain for any changes. The following actions may help to alleviate any discomfort you are experiencing:  Only take over-the-counter or prescription medicines as directed by your health care provider.  Do not take laxatives unless directed to do so by your health care provider.  Try a clear liquid diet (broth, tea, or water) as directed by your health care provider. Slowly move to a bland diet as tolerated. SEEK MEDICAL CARE IF:  You have unexplained abdominal pain.  You have abdominal pain associated with nausea or diarrhea.  You have pain when you urinate or have a bowel movement.  You experience abdominal pain that wakes you in the night.  You have abdominal pain that is worsened or improved by eating food.  You have abdominal pain that is worsened with eating fatty foods.  You have a fever. SEEK IMMEDIATE MEDICAL CARE IF:   Your pain does not go away within 2 hours.  You keep throwing up (vomiting).  Your pain is felt only in portions of the abdomen, such as the right side or the left lower portion of the abdomen.  You pass bloody or black tarry stools. MAKE SURE YOU:  Understand these instructions.   Will watch your condition.   Will get help right away if  you are not doing well or get worse.  Document Released: 08/03/2005 Document Revised: 10/29/2013 Document Reviewed: 07/03/2013 [employer] Patient Information 2015 [place], [place]. This information is not intended to replace advice given to you by your health care provider. Make sure you discuss any questions you have with your health care provider.

## 2014-09-08 ENCOUNTER — Encounter (HOSPITAL_COMMUNITY): Payer: Self-pay | Admitting: Emergency Medicine

## 2014-09-14 ENCOUNTER — Other Ambulatory Visit: Payer: Self-pay | Admitting: Family Medicine

## 2015-04-02 ENCOUNTER — Encounter: Payer: Self-pay | Admitting: Internal Medicine

## 2015-10-14 ENCOUNTER — Emergency Department (HOSPITAL_COMMUNITY)
Admission: EM | Admit: 2015-10-14 | Discharge: 2015-10-14 | Disposition: A | Discharge status: Home / Self Care | Payer: Self-pay | Source: Home / Self Care

## 2015-10-14 ENCOUNTER — Encounter (HOSPITAL_COMMUNITY): Payer: Self-pay | Admitting: Emergency Medicine

## 2015-10-14 DIAGNOSIS — H6692 Otitis media, unspecified, left ear: Secondary | ICD-10-CM

## 2015-10-14 MED ORDER — AMOXICILLIN-POT CLAVULANATE 875-125 MG PO TABS
1.0000 | ORAL_TABLET | Freq: Two times a day (BID) | ORAL | Status: DC
Start: 2015-10-14 — End: 2016-12-12

## 2015-10-14 NOTE — ED Provider Notes (Signed)
CSN: [id number]     Arrival date & time 10/14/15  1308 History   None    No chief complaint on file.  (Consider location/radiation/quality/duration/timing/severity/associated sxs/prior Treatment) HPI History obtained from patient:   LOCATION:left ear SEVERITY:7 DURATION:3 days CONTEXT: sudden onset with drainage from ear QUALITY:popping sensation MODIFYING FACTORS: ibuprofen at home with minimal relief ASSOCIATED SYMPTOMS:headache TIMING:constant OCCUPATION:  Past Medical History  Diagnosis Date  . HSV-1 infection   . Abnormal Pap smear   . Anxiety   . Headache(784.0)   . Hyperthyroidism   . IBS (irritable bowel syndrome)   . Tachycardia   . Family history of anesthesia complication     mother has difficult  time going to sleep  . ADD (attention deficit disorder)    Past Surgical History  Procedure Laterality Date  . Wisdom tooth extraction      x4  . Cesarean section  09/13/2012    Procedure: CESAREAN SECTION;  Surgeon: Lavonia Drafts, MD;  Location: [facility];  Service: Obstetrics;  Laterality: N/A;  . Wrist surgery Right 07-23-2013  . Kyphoplasty N/A 08/27/2013    Procedure: Thoracic Twelve Kyphoplasty;  Surgeon: Erline Levine, MD;  Location: [facility];  Service: Neurosurgery;  Laterality: N/A;  T12 Kyphoplasty   Family History  Problem Relation Age of Onset  . Depression Mother     chronic  . Hearing loss Mother   . Hyperlipidemia Mother   . Vision loss Mother     beachets disease  . COPD Father   . Diabetes Father   . Cancer Maternal Grandmother 41    breast cancer  . Parkinsonism Maternal Grandmother   . Heart disease Maternal Grandmother   . Alzheimer's disease Paternal Grandmother   . Heart disease Paternal Grandfather     Died age 77  . Nephrolithiasis Paternal Grandfather   . Cancer Other 60    breast   Social History  Substance Use Topics  . Smoking status: Former Smoker -- 1.00 packs/day for 10 years    Types: Cigarettes    Quit  date: 01/31/2012  . Smokeless tobacco: Never Used  . Alcohol Use: No   OB History    Gravida Para Term Preterm AB TAB SAB Ectopic Multiple Living   1 1 0 1 0 0 0 0 1 2      Review of Systems  Constitutional: Negative.   HENT: Positive for ear pain.   Respiratory: Negative.     Allergies  Review of patient's allergies indicates no known allergies.  Home Medications   Prior to Admission medications   Medication Sig Start Date End Date Taking? Authorizing Provider  cetirizine (ZYRTEC) 10 MG tablet Take 10 mg by mouth daily as needed for allergies.    Historical Provider, MD  HYDROcodone-acetaminophen (NORCO) 10-325 MG per tablet Take 1 tablet by mouth every 6 (six) hours as needed for pain.    Historical Provider, MD  ibuprofen (ADVIL,MOTRIN) 200 MG tablet Take 800 mg by mouth every 8 (eight) hours as needed for pain.    Historical Provider, MD  omeprazole (PRILOSEC) 40 MG capsule Take 40 mg by mouth daily as needed (for acid reflux).    Historical Provider, MD  promethazine (PHENERGAN) 25 MG tablet Take 25 mg by mouth every 6 (six) hours as needed for nausea.    Historical Provider, MD  [place] 28 0.25-35 MG-MCG tablet TAKE 1 TABLET BY MOUTH DAILY. 09/14/14   Donnamae Jude, MD   Meds Ordered and Administered this Visit  Medications - No data to display  BP 120/83 mmHg  Pulse 98  Temp(Src) 98.8 F (37.1 C) (Oral)  SpO2 98% No data found.   Physical Exam  Constitutional: She is oriented to person, place, and time. She appears well-developed and well-nourished. No distress.  HENT:  Head: Normocephalic and atraumatic.  Right Ear: External ear normal.  Mouth/Throat: No oropharyngeal exudate.  Left ear red bulging with small perforation noted at about the 5:00 position on the TM. No blood in the EAC.  Eyes: Right eye exhibits no discharge. Left eye exhibits no discharge.  Pulmonary/Chest: Effort normal.  Musculoskeletal: Normal range of motion.  Neurological: She is alert and  oriented to person, place, and time.  Skin: Skin is warm and dry.  Psychiatric: She has a normal mood and affect. Her behavior is normal. Judgment and thought content normal.  Nursing note and vitals reviewed.   ED Course  Procedures (including critical care time)  Labs Review Labs Reviewed - No data to display  Imaging Review No results found.   Visual Acuity Review  Right Eye Distance:   Left Eye Distance:   Bilateral Distance:    Right Eye Near:   Left Eye Near:    Bilateral Near:         MDM  No diagnosis found. Prescription for Augmentin as prescribed. Patient is advised to continue symptomatic treatment she will need to be seen by her primary care provider in the next couple weeks for review of the small perforation in her tympanic membrane on the left. If she continues have leakage of fluid she may need to see ENT earlier. Instructions of care provided discharged home in stable condition    Konrad Felix, [place] 10/14/15 1359

## 2015-10-14 NOTE — ED Notes (Signed)
Sinus congestion, headache, and forehead pain.  Patient has had these symptoms for 2 weeks.  Patient has left ear pain and drainage.  Reports drainage has been pus-like.

## 2015-10-14 NOTE — Discharge Instructions (Signed)
Otitis Media, Adult °Otitis media is redness, soreness, and puffiness (swelling) in the space just behind your eardrum (middle ear). It may be caused by allergies or infection. It often happens along with a cold. °HOME CARE °· Take your medicine as told. Finish it even if you start to feel better. °· Only take over-the-counter or prescription medicines for pain, discomfort, or fever as told by your doctor. °· Follow up with your doctor as told. °GET HELP IF: °· You have otitis media only in one ear, or bleeding from your nose, or both. °· You notice a lump on your neck. °· You are not getting better in 3-5 days. °· You feel worse instead of better. °GET HELP RIGHT AWAY IF:  °· You have pain that is not helped with medicine. °· You have puffiness, redness, or pain around your ear. °· You get a stiff neck. °· You cannot move part of your face (paralysis). °· You notice that the bone behind your ear hurts when you touch it. °MAKE SURE YOU:  °· Understand these instructions. °· Will watch your condition. °· Will get help right away if you are not doing well or get worse. °  °This information is not intended to replace advice given to you by your health care provider. Make sure you discuss any questions you have with your health care provider. °  °Document Released: 04/11/2008 Document Revised: 11/14/2014 Document Reviewed: 05/21/2013 °[employer]vier Interactive Patient Education ©2016 [employer] ° ° °

## 2016-11-08 DIAGNOSIS — Z23 Encounter for immunization: Secondary | ICD-10-CM | POA: Diagnosis not present

## 2016-11-09 DIAGNOSIS — Z3042 Encounter for surveillance of injectable contraceptive: Secondary | ICD-10-CM | POA: Diagnosis not present

## 2016-11-15 DIAGNOSIS — F341 Dysthymic disorder: Secondary | ICD-10-CM | POA: Diagnosis not present

## 2016-11-15 DIAGNOSIS — H9201 Otalgia, right ear: Secondary | ICD-10-CM | POA: Diagnosis not present

## 2016-11-15 DIAGNOSIS — Z6824 Body mass index (BMI) 24.0-24.9, adult: Secondary | ICD-10-CM | POA: Diagnosis not present

## 2016-11-15 DIAGNOSIS — Z1389 Encounter for screening for other disorder: Secondary | ICD-10-CM | POA: Diagnosis not present

## 2016-12-06 ENCOUNTER — Encounter: Payer: Self-pay | Admitting: *Deleted

## 2016-12-08 NOTE — Progress Notes (Signed)
Cardiology Office Note   Date:  12/12/2016   ID:  Kimberly [name], DOB 06/18/1986, MRN [id number]  PCP:  Odette Fraction, MD  Cardiologist:   Jenkins Rouge, MD   No chief complaint on file.     History of Present Illness: Kimberly [name] is a 31 y.o. female who presents for tachycardia.  Has anxiety , ADD and ETOH  abuse  Note Dr Gerarda Fraction suggested inpatient alcohol withdrawal admission. Give valium 10 mg tid Plan to start Cymbalta for depression after inpatient care for alcoholism   Seen by Dr [name] in 2013  He noted chronic palpitations and tachycardia. Thyroid fine At one point Rx with beta blockers On conerta at that time Echo normal   She indicates thyroid normal at health department last year 11/09/16 went to detox for 5 days has been sober Previous vodka drinking.   Has brother and both parents not alcoholic  Currently married and cares for her 33 yo twins  Past Medical History:  Diagnosis Date  . Abnormal Pap smear   . ADD (attention deficit disorder)   . Anxiety   . Family history of anesthesia complication    mother has difficult  time going to sleep  . Headache(784.0)   . HSV-1 infection   . Hyperthyroidism   . IBS (irritable bowel syndrome)   . Tachycardia     Past Surgical History:  Procedure Laterality Date  . CESAREAN SECTION  09/13/2012   Procedure: CESAREAN SECTION;  Surgeon: Lavonia Drafts, MD;  Location: [facility];  Service: Obstetrics;  Laterality: N/A;  . KYPHOPLASTY N/A 08/27/2013   Procedure: Thoracic Twelve Kyphoplasty;  Surgeon: Erline Levine, MD;  Location: [facility];  Service: Neurosurgery;  Laterality: N/A;  T12 Kyphoplasty  . WISDOM TOOTH EXTRACTION     x4  . WRIST SURGERY Right 07-23-2013     Current Outpatient Prescriptions  Medication Sig Dispense Refill  . DULoxetine (CYMBALTA) 30 MG capsule Take 30 mg by mouth daily.    Marland Kitchen ibuprofen (ADVIL,MOTRIN) 200 MG tablet Take 800 mg by mouth every 8 (eight) hours as needed  for pain.    . medroxyPROGESTERone (DEPO-PROVERA) 150 MG/ML injection Inject 150 mg into the muscle every 3 (three) months.     No current facility-administered medications for this visit.     Allergies:   Patient has no known allergies.    Social History:  The patient  reports that she quit smoking about 4 years ago. Her smoking use included Cigarettes. She has a 10.00 pack-year smoking history. She has never used smokeless tobacco. She reports that she does not drink alcohol or use drugs.   Family History:  The patient's family history includes Alzheimer's disease in her paternal grandmother; COPD in her father; Cancer (age of onset: 62) in her maternal grandmother; Cancer (age of onset: 70) in her other; Depression in her mother; Diabetes in her father; Hearing loss in her mother; Heart disease in her maternal grandmother and paternal grandfather; Hyperlipidemia in her mother; Nephrolithiasis in her paternal grandfather; Parkinsonism in her maternal grandmother; Vision loss in her mother.    ROS:  Please see the history of present illness.   Otherwise, review of systems are positive for none.   All other systems are reviewed and negative.    PHYSICAL EXAM: VS:  BP 106/74   Pulse (!) 113   Ht 5\' 7"  (1.702 m)   Wt 158 lb (71.7 kg)   SpO2 98%   BMI 24.75 kg/m  ,  BMI Body mass index is 24.75 kg/m. Affect appropriate Healthy:  appears stated age 40: normal Neck supple with no adenopathy JVP normal no bruits no thyromegaly Lungs clear with no wheezing and good diaphragmatic motion Heart:  S1/S2 no murmur, no rub, gallop or click PMI normal Abdomen: benighn, BS positve, no tenderness, no AAA no bruit.  No HSM or HJR Distal pulses intact with no bruits No edema Neuro non-focal Skin warm and dry No muscular weakness    EKG:  03/09/12 SR rate 113 low voltage  12/12/16  SR rate 98 normal no pre excitation    Recent Labs: No results found for requested labs within last 8760  hours.    Lipid Panel No results found for: CHOL, TRIG, HDL, CHOLHDL, VLDL, LDLCALC, LDLDIRECT    Wt Readings from Last 3 Encounters:  12/12/16 158 lb (71.7 kg)  10/01/13 137 lb (62.1 kg)  08/27/13 137 lb 12.6 oz (62.5 kg)      Other studies Reviewed: Additional studies/ records that were reviewed today include: Notes Dr [name] 2013 and Dr Gerarda Fraction.    ASSESSMENT AND PLAN:  1.  Tachycardia relative related to Anxiety / ETOH.  F/u 48 hr holter for average HR and make sure she has Normal circadian decrease at night. Would like to avoid beta blocker with Rx for depression. F/U echo as Well to r/o DCM  2.  ETOH Abuse has abstained since 11/09/16 inpatient detox  3.  Depression  On cymbalta could not tolerate higher dose makes her sleepy  4. GERD:  Continue prilosec    Current medicines are reviewed at length with the patient today.  The patient does not have concerns regarding medicines.  The following changes have been made:  no change  Labs/ tests ordered today include: 48 hr holter and echo   Orders Placed This Encounter  Procedures  . Holter monitor - 48 hour  . EKG 12-Lead  . ECHOCARDIOGRAM COMPLETE     Disposition:   FU with PRN      Signed, Jenkins Rouge, MD  12/12/2016 2:56 PM    [address], [place], [place]  [address] Phone: [phone]; Fax: [phone]

## 2016-12-12 ENCOUNTER — Ambulatory Visit (INDEPENDENT_AMBULATORY_CARE_PROVIDER_SITE_OTHER): Payer: BLUE CROSS/BLUE SHIELD | Admitting: Cardiovascular Disease

## 2016-12-12 ENCOUNTER — Encounter: Payer: Self-pay | Admitting: Cardiovascular Disease

## 2016-12-12 VITALS — BP 106/74 | HR 113 | Ht 67.0 in | Wt 158.0 lb

## 2016-12-12 DIAGNOSIS — R000 Tachycardia, unspecified: Secondary | ICD-10-CM | POA: Diagnosis not present

## 2016-12-12 DIAGNOSIS — R002 Palpitations: Secondary | ICD-10-CM

## 2016-12-12 NOTE — Patient Instructions (Signed)
Your physician recommends that you schedule a follow-up appointment  As Needed   Your physician recommends that you continue on your current medications as directed. Please refer to the Current Medication list given to you today.  Your physician has requested that you have an echocardiogram. Echocardiography is a painless test that uses sound waves to create images of your heart. It provides your doctor with information about the size and shape of your heart and how well your heart's chambers and valves are working. This procedure takes approximately one hour. There are no restrictions for this procedure.  Your physician has recommended that you wear a holter monitor. Holter monitors are medical devices that record the heart's electrical activity. Doctors most often use these monitors to diagnose arrhythmias. Arrhythmias are problems with the speed or rhythm of the heartbeat. The monitor is a small, portable device. You can wear one while you do your normal daily activities. This is usually used to diagnose what is causing palpitations/syncope (passing out).   If you need a refill on your cardiac medications before your next appointment, please call your pharmacy.  Thank you for choosing [facility]!

## 2016-12-19 ENCOUNTER — Ambulatory Visit (HOSPITAL_COMMUNITY)
Admission: RE | Admit: 2016-12-19 | Discharge: 2016-12-19 | Disposition: A | Discharge status: Home / Self Care | Payer: BLUE CROSS/BLUE SHIELD | Source: Ambulatory Visit | Attending: Cardiovascular Disease | Admitting: Cardiovascular Disease

## 2016-12-19 DIAGNOSIS — R002 Palpitations: Secondary | ICD-10-CM

## 2016-12-19 NOTE — Progress Notes (Signed)
*  PRELIMINARY RESULTS* Echocardiogram 2D Echocardiogram has been performed.  Leavy Cella 12/19/2016, 12:00 PM

## 2016-12-26 DIAGNOSIS — F329 Major depressive disorder, single episode, unspecified: Secondary | ICD-10-CM | POA: Diagnosis not present

## 2016-12-26 DIAGNOSIS — Z6824 Body mass index (BMI) 24.0-24.9, adult: Secondary | ICD-10-CM | POA: Diagnosis not present

## 2016-12-26 DIAGNOSIS — Z1389 Encounter for screening for other disorder: Secondary | ICD-10-CM | POA: Diagnosis not present

## 2017-01-02 ENCOUNTER — Telehealth: Payer: Self-pay | Admitting: Cardiovascular Disease

## 2017-01-02 NOTE — Telephone Encounter (Signed)
New message ° ° ° ° °Returning a call to the nurse °

## 2017-01-02 NOTE — Telephone Encounter (Signed)
Per Dr. Johnsie Cancel, Normal echo with good EF and no significant valvular heart disease. Patient aware of results.

## 2017-02-01 DIAGNOSIS — Z3042 Encounter for surveillance of injectable contraceptive: Secondary | ICD-10-CM | POA: Diagnosis not present

## 2017-02-09 ENCOUNTER — Ambulatory Visit (HOSPITAL_COMMUNITY)
Admission: EM | Admit: 2017-02-09 | Discharge: 2017-02-09 | Disposition: A | Discharge status: Home / Self Care | Payer: BLUE CROSS/BLUE SHIELD | Attending: Family Medicine | Admitting: Family Medicine

## 2017-02-09 ENCOUNTER — Encounter (HOSPITAL_COMMUNITY): Payer: Self-pay | Admitting: *Deleted

## 2017-02-09 ENCOUNTER — Ambulatory Visit (INDEPENDENT_AMBULATORY_CARE_PROVIDER_SITE_OTHER): Payer: BLUE CROSS/BLUE SHIELD

## 2017-02-09 DIAGNOSIS — M79672 Pain in left foot: Secondary | ICD-10-CM | POA: Diagnosis not present

## 2017-02-09 MED ORDER — NAPROXEN 500 MG PO TABS: 500.0000 mg | ORAL_TABLET | Freq: Two times a day (BID) | ORAL | 0 refills | Status: DC

## 2017-02-09 NOTE — ED Provider Notes (Signed)
CSN: [id number]     Arrival date & time 02/09/17  1007 History   First MD Initiated Contact with Patient 02/09/17 1019     Chief Complaint  Patient presents with  . Foot Pain   (Consider location/radiation/quality/duration/timing/severity/associated sxs/prior Treatment) Pt here for LT foot pain that began with an injury to 2nd/3rd toes approx 3 weeks ago when she hit her toes. Pt thought they may be broke but never had them evaluated. Pt still feels pain that is radiating to top area of foot intermit. Has not taken anything today. Noting makes it better and applying wt makes it worse.        Past Medical History:  Diagnosis Date  . Abnormal Pap smear   . ADD (attention deficit disorder)   . Anxiety   . Family history of anesthesia complication    mother has difficult  time going to sleep  . Headache(784.0)   . HSV-1 infection   . Hyperthyroidism   . IBS (irritable bowel syndrome)   . Tachycardia    Past Surgical History:  Procedure Laterality Date  . CESAREAN SECTION  09/13/2012   Procedure: CESAREAN SECTION;  Surgeon: Lavonia Drafts, MD;  Location: [facility];  Service: Obstetrics;  Laterality: N/A;  . KYPHOPLASTY N/A 08/27/2013   Procedure: Thoracic Twelve Kyphoplasty;  Surgeon: Erline Levine, MD;  Location: [facility];  Service: Neurosurgery;  Laterality: N/A;  T12 Kyphoplasty  . WISDOM TOOTH EXTRACTION     x4  . WRIST SURGERY Right 07-23-2013   Family History  Problem Relation Age of Onset  . Depression Mother     chronic  . Hearing loss Mother   . Hyperlipidemia Mother   . Vision loss Mother     beachets disease  . COPD Father   . Diabetes Father   . Cancer Maternal Grandmother 55    breast cancer  . Parkinsonism Maternal Grandmother   . Heart disease Maternal Grandmother   . Alzheimer's disease Paternal Grandmother   . Heart disease Paternal Grandfather     Died age 93  . Nephrolithiasis Paternal Grandfather   . Cancer Other 60    breast   Social  History  Substance Use Topics  . Smoking status: Former Smoker    Packs/day: 1.00    Years: 10.00    Types: Cigarettes    Quit date: 01/31/2012  . Smokeless tobacco: Never Used  . Alcohol use No   OB History    Gravida Para Term Preterm AB Living   1 1 0 1 0 2   SAB TAB Ectopic Multiple Live Births   0 0 0 1 2     Review of Systems  Constitutional: Negative.   Respiratory: Negative.   Cardiovascular: Negative.   Musculoskeletal: Positive for joint swelling.       Pain to LT foot 2/3 digit radiating to mid top of foot.   Skin: Negative.   Neurological: Negative.     Allergies  Patient has no known allergies.  Home Medications   Prior to Admission medications   Medication Sig Start Date End Date Taking? Authorizing Provider  DULoxetine (CYMBALTA) 30 MG capsule Take 30 mg by mouth daily.    Historical Provider, MD  ibuprofen (ADVIL,MOTRIN) 200 MG tablet Take 800 mg by mouth every 8 (eight) hours as needed for pain.    Historical Provider, MD  medroxyPROGESTERone (DEPO-PROVERA) 150 MG/ML injection Inject 150 mg into the muscle every 3 (three) months.    Historical Provider, MD  Meds Ordered and Administered this Visit  Medications - No data to display  BP 132/70 (BP Location: Right Arm)   Pulse 78   Temp 98.6 F (37 C) (Oral)   Resp 18   SpO2 100%  No data found.   Physical Exam  Constitutional: She appears well-developed.  Cardiovascular: Normal rate.   Pulmonary/Chest: Effort normal.  Musculoskeletal: She exhibits edema and tenderness.  Minimal swelling noted to 2nd digit, full ROM to digits, no erythema, warm to touch, strong pulses.   Skin: Skin is warm. Capillary refill takes less than 2 seconds.    Urgent Care Course     Procedures (including critical care time)  Labs Review Labs Reviewed - No data to display  Imaging Review No results found.           MDM   1. Foot pain, left    Apply ice or heat  Take tylenol or motrin as needed  for pain.  Your xray was negative. You may need to see ortho if the pain continues.     Melanee Left, NP 02/09/17 1058

## 2017-02-09 NOTE — ED Triage Notes (Signed)
Pt  Reports   Pain l  Foot  X  sev  Weeks    Denies   Any    Injury     Pain  On  Weight         Bearing

## 2017-03-16 DIAGNOSIS — M79672 Pain in left foot: Secondary | ICD-10-CM | POA: Diagnosis not present

## 2017-03-16 DIAGNOSIS — S92325A Nondisplaced fracture of second metatarsal bone, left foot, initial encounter for closed fracture: Secondary | ICD-10-CM | POA: Diagnosis not present

## 2017-04-11 DIAGNOSIS — S92325D Nondisplaced fracture of second metatarsal bone, left foot, subsequent encounter for fracture with routine healing: Secondary | ICD-10-CM | POA: Diagnosis not present

## 2017-04-27 DIAGNOSIS — Z3049 Encounter for surveillance of other contraceptives: Secondary | ICD-10-CM | POA: Diagnosis not present

## 2017-04-27 DIAGNOSIS — R197 Diarrhea, unspecified: Secondary | ICD-10-CM | POA: Diagnosis not present

## 2017-04-27 DIAGNOSIS — Z01419 Encounter for gynecological examination (general) (routine) without abnormal findings: Secondary | ICD-10-CM | POA: Diagnosis not present

## 2017-04-27 DIAGNOSIS — Z3009 Encounter for other general counseling and advice on contraception: Secondary | ICD-10-CM | POA: Diagnosis not present

## 2017-07-24 DIAGNOSIS — Z3042 Encounter for surveillance of injectable contraceptive: Secondary | ICD-10-CM | POA: Diagnosis not present

## 2017-08-02 ENCOUNTER — Ambulatory Visit (INDEPENDENT_AMBULATORY_CARE_PROVIDER_SITE_OTHER): Payer: BLUE CROSS/BLUE SHIELD | Admitting: Gynecology

## 2017-08-02 ENCOUNTER — Encounter: Payer: Self-pay | Admitting: Gynecology

## 2017-08-02 VITALS — BP 116/74 | Ht 67.0 in | Wt 159.0 lb

## 2017-08-02 DIAGNOSIS — N63 Unspecified lump in unspecified breast: Secondary | ICD-10-CM

## 2017-08-02 DIAGNOSIS — N6314 Unspecified lump in the right breast, lower inner quadrant: Secondary | ICD-10-CM

## 2017-08-02 NOTE — Progress Notes (Signed)
    TAWAN CORKERN [birth year] [id number]        31 y.o.  G1P0102 former a patient of Dr. Toney Rakes presents having not been seen in the office a number of years complaining of a right breast mass over the last several weeks. Has a history of dense breasts with breast cysts noted on ultrasounds done elsewhere in the past. In 2016 had area biopsied with clip placed in the right breast which turned out to be benign.  Without menses, Depo-Provera contraception for approximately 2 years with last injection one week ago. Pap smear/HPV 2016 negative.   Past medical history,surgical history, problem list, medications, allergies, family history and social history were all reviewed and documented in the EPIC chart.  Directed ROS with pertinent positives and negatives documented in the history of present illness/assessment and plan.  Exam: Caryn Bee assistant Vitals:   08/02/17 1356  BP: 116/74  Weight: 159 lb (72.1 kg)  Height: 5\' 7"  (1.702 m)   General appearance:  Normal Both breast examined lying and sitting without masses retractions discharge adenopathy. The area the patient is pointing to is in the right breast 4 to 6:00 position off the areola. No palpable abnormalities in this area. Breasts are noted to be dense.   Assessment/Plan:  31 y.o. G1P01with patient proceed mass in the right breast 4:00-6:00 position off the areola. Physician exam normal.  Recommend starting with ultrasound and mammogram of this area. Will schedule at the breast Center on patient knows to expect a phone call within 1-2 weeks. She has call my office if she does not hear from them. If studies are negative she'll continue with self breast exams as long as no palpable changes she'll follow expectantly.  If any abnormalities then will triage based upon recommendations by radiology.    Anastasio Auerbach MD, 2:24 PM 08/02/2017

## 2017-08-02 NOTE — Patient Instructions (Signed)
The breast center will call you to arrange for the mammogram and ultrasound. Call my office if you do not hear from them within 1-2 weeks.

## 2017-08-07 ENCOUNTER — Encounter: Payer: Self-pay | Admitting: Women's Health

## 2017-08-07 ENCOUNTER — Telehealth: Payer: Self-pay | Admitting: *Deleted

## 2017-08-07 ENCOUNTER — Ambulatory Visit (INDEPENDENT_AMBULATORY_CARE_PROVIDER_SITE_OTHER): Payer: BLUE CROSS/BLUE SHIELD | Admitting: Women's Health

## 2017-08-07 VITALS — BP 128/80

## 2017-08-07 DIAGNOSIS — Z23 Encounter for immunization: Secondary | ICD-10-CM

## 2017-08-07 DIAGNOSIS — B9689 Other specified bacterial agents as the cause of diseases classified elsewhere: Secondary | ICD-10-CM | POA: Diagnosis not present

## 2017-08-07 DIAGNOSIS — R300 Dysuria: Secondary | ICD-10-CM

## 2017-08-07 DIAGNOSIS — N760 Acute vaginitis: Secondary | ICD-10-CM | POA: Diagnosis not present

## 2017-08-07 DIAGNOSIS — N63 Unspecified lump in unspecified breast: Secondary | ICD-10-CM

## 2017-08-07 DIAGNOSIS — N3001 Acute cystitis with hematuria: Secondary | ICD-10-CM

## 2017-08-07 MED ORDER — SULFAMETHOXAZOLE-TRIMETHOPRIM 800-160 MG PO TABS: 1.0000 | ORAL_TABLET | Freq: Two times a day (BID) | ORAL | 0 refills | Status: DC

## 2017-08-07 MED ORDER — METRONIDAZOLE 500 MG PO TABS: 500.0000 mg | ORAL_TABLET | Freq: Two times a day (BID) | ORAL | 0 refills | Status: DC

## 2017-08-07 NOTE — Telephone Encounter (Signed)
Patient was seen on 08/02/17 staff message was never sent to me to schedule the below:  patient proceed mass in the right breast 4:00-6:00 position off the areola. Physician exam normal.  Recommend starting with ultrasound and mammogram of this area. Will schedule at the breast Center.  Orders placed, pt scheduled on 10/41/18 @ 11:20am at breast center,pt aware of time and date.

## 2017-08-07 NOTE — Patient Instructions (Signed)
Bacterial Vaginosis Bacterial vaginosis is an infection of the vagina. It happens when too many germs (bacteria) grow in the vagina. This infection puts you at risk for infections from sex (STIs). Treating this infection can lower your risk for some STIs. You should also treat this if you are pregnant. It can cause your baby to be born early. Follow these instructions at home: Medicines  Take over-the-counter and prescription medicines only as told by your doctor.  Take or use your antibiotic medicine as told by your doctor. Do not stop taking or using it even if you start to feel better. General instructions  If you your sexual partner is a woman, tell her that you have this infection. She needs to get treatment if she has symptoms. If you have a female partner, he does not need to be treated.  During treatment: ? Avoid sex. ? Do not douche. ? Avoid alcohol as told. ? Avoid breastfeeding as told.  Drink enough fluid to keep your pee (urine) clear or pale yellow.  Keep your vagina and butt (rectum) clean. ? Wash the area with warm water every day. ? Wipe from front to back after you use the toilet.  Keep all follow-up visits as told by your doctor. This is important. Preventing this condition  Do not douche.  Use only warm water to wash around your vagina.  Use protection when you have sex. This includes: ? Latex condoms. ? Dental dams.  Limit how many people you have sex with. It is best to only have sex with the same person (be monogamous).  Get tested for STIs. Have your partner get tested.  Wear underwear that is cotton or lined with cotton.  Avoid tight pants and pantyhose. This is most important in summer.  Do not use any products that have nicotine or tobacco in them. These include cigarettes and e-cigarettes. If you need help quitting, ask your doctor.  Do not use illegal drugs.  Limit how much alcohol you drink. Contact a doctor if:  Your symptoms do not get  better, even after you are treated.  You have more discharge or pain when you pee (urinate).  You have a fever.  You have pain in your belly (abdomen).  You have pain with sex.  Your bleed from your vagina between periods. Summary  This infection happens when too many germs (bacteria) grow in the vagina.  Treating this condition can lower your risk for some infections from sex (STIs).  You should also treat this if you are pregnant. It can cause early (premature) birth.  Do not stop taking or using your antibiotic medicine even if you start to feel better. This information is not intended to replace advice given to you by your health care provider. Make sure you discuss any questions you have with your health care provider. Document Released: 08/02/2008 Document Revised: 07/09/2016 Document Reviewed: 07/09/2016 Elsevier Interactive Patient Education  2017 [employer] Urinary Tract Infection, Adult A urinary tract infection (UTI) is an infection of any part of the urinary tract, which includes the kidneys, ureters, bladder, and urethra. These organs make, store, and get rid of urine in the body. UTI can be a bladder infection (cystitis) or kidney infection (pyelonephritis). What are the causes? This infection may be caused by fungi, viruses, or bacteria. Bacteria are the most common cause of UTIs. This condition can also be caused by repeated incomplete emptying of the bladder during urination. What increases the risk? This condition is more  likely to develop if:  You ignore your need to urinate or hold urine for long periods of time.  You do not empty your bladder completely during urination.  You wipe back to front after urinating or having a bowel movement, if you are female.  You are uncircumcised, if you are female.  You are constipated.  You have a urinary catheter that stays in place (indwelling).  You have a weak defense (immune) system.  You have a medical  condition that affects your bowels, kidneys, or bladder.  You have diabetes.  You take antibiotic medicines frequently or for long periods of time, and the antibiotics no longer work well against certain types of infections (antibiotic resistance).  You take medicines that irritate your urinary tract.  You are exposed to chemicals that irritate your urinary tract.  You are female.  What are the signs or symptoms? Symptoms of this condition include:  Fever.  Frequent urination or passing small amounts of urine frequently.  Needing to urinate urgently.  Pain or burning with urination.  Urine that smells bad or unusual.  Cloudy urine.  Pain in the lower abdomen or back.  Trouble urinating.  Blood in the urine.  Vomiting or being less hungry than normal.  Diarrhea or abdominal pain.  Vaginal discharge, if you are female.  How is this diagnosed? This condition is diagnosed with a medical history and physical exam. You will also need to provide a urine sample to test your urine. Other tests may be done, including:  Blood tests.  Sexually transmitted disease (STD) testing.  If you have had more than one UTI, a cystoscopy or imaging studies may be done to determine the cause of the infections. How is this treated? Treatment for this condition often includes a combination of two or more of the following:  Antibiotic medicine.  Other medicines to treat less common causes of UTI.  Over-the-counter medicines to treat pain.  Drinking enough water to stay hydrated.  Follow these instructions at home:  Take over-the-counter and prescription medicines only as told by your health care provider.  If you were prescribed an antibiotic, take it as told by your health care provider. Do not stop taking the antibiotic even if you start to feel better.  Avoid alcohol, caffeine, tea, and carbonated beverages. They can irritate your bladder.  Drink enough fluid to keep your  urine clear or pale yellow.  Keep all follow-up visits as told by your health care provider. This is important.  Make sure to: ? Empty your bladder often and completely. Do not hold urine for long periods of time. ? Empty your bladder before and after sex. ? Wipe from front to back after a bowel movement if you are female. Use each tissue one time when you wipe. Contact a health care provider if:  You have back pain.  You have a fever.  You feel nauseous or vomit.  Your symptoms do not get better after 3 days.  Your symptoms go away and then return. Get help right away if:  You have severe back pain or lower abdominal pain.  You are vomiting and cannot keep down any medicines or water. This information is not intended to replace advice given to you by your health care provider. Make sure you discuss any questions you have with your health care provider. Document Released: 08/03/2005 Document Revised: 04/06/2016 Document Reviewed: 09/14/2015 Elsevier Interactive Patient Education  2017 [employer].

## 2017-08-07 NOTE — Progress Notes (Signed)
31 yo G2A1P2 (33yo twins)  Presents with complaint of increased urinary frequency, urgency, bladder pressure and pain at end of stream of urination for the past week with greater intensity over the last 2 days. Denies abdominal pain, vaginal discharge or fever. Contraceptives on Depo-Provera. Was seen in the office last week with complaint of right breast lump being scheduled for a diagnostic mammogram. Had annual exam at primary care, reports normal Pap 2 years ago.  Exam: Appears well. No CVAT. UA: +2 blood, positive nitrites, +2 leukocytes, greater than 60 WBCs, 40-60 RBCs, many bacteria, many clue cells  UTI with hematuria Bacteria vaginosis  Plan: Septra twice daily for 3 days, UTI prevention discussed. Urine culture pending. Instructed to call if no relief of symptoms. Flagyl 500 twice daily for 7 days, alcohol precautions reviewed. Instructed to call office if she has not heard from [name] in the next day for diagnostic mammogram. Flu shot given.

## 2017-08-09 LAB — URINALYSIS W MICROSCOPIC + REFLEX CULTURE
Bilirubin Urine: NEGATIVE
Glucose, UA: NEGATIVE
HYALINE CAST: NONE SEEN /LPF
NITRITES URINE, INITIAL: POSITIVE — AB
SPECIFIC GRAVITY, URINE: 1.02 (ref 1.001–1.03)
WBC, UA: >=60 /HPF — AB (ref 0–5)
pH: 5.5 (ref 5.0–8.0)

## 2017-08-09 LAB — URINE CULTURE
MICRO NUMBER: 81085221
SPECIMEN QUALITY:: ADEQUATE

## 2017-08-09 LAB — CULTURE INDICATED

## 2017-08-10 ENCOUNTER — Other Ambulatory Visit: Payer: Self-pay | Admitting: Gynecology

## 2017-08-10 ENCOUNTER — Ambulatory Visit
Admission: RE | Admit: 2017-08-10 | Discharge: 2017-08-10 | Disposition: A | Discharge status: Home / Self Care | Payer: BLUE CROSS/BLUE SHIELD | Source: Ambulatory Visit | Attending: Gynecology | Admitting: Gynecology

## 2017-08-10 DIAGNOSIS — N63 Unspecified lump in unspecified breast: Secondary | ICD-10-CM

## 2017-08-10 DIAGNOSIS — R922 Inconclusive mammogram: Secondary | ICD-10-CM | POA: Diagnosis not present

## 2017-08-10 DIAGNOSIS — N6489 Other specified disorders of breast: Secondary | ICD-10-CM | POA: Diagnosis not present

## 2017-08-11 ENCOUNTER — Other Ambulatory Visit: Payer: Self-pay | Admitting: Women's Health

## 2017-08-11 DIAGNOSIS — N3001 Acute cystitis with hematuria: Secondary | ICD-10-CM

## 2017-08-15 ENCOUNTER — Telehealth: Payer: Self-pay | Admitting: *Deleted

## 2017-08-15 NOTE — Telephone Encounter (Signed)
Patient called stating she has been around her step daughter who has a infant and noticed a increase in breast drainage which per patient is breast milk. Pt said she did breast feed her boys, which are now 5, states her breast still produce milk but not a lot, states sometimes the drainage in clear and sometime its milk. No pain in breast, had recent diag mammogram last week, pt said her mother is in her 40's and still produces breast milk. Please advise

## 2017-08-16 NOTE — Telephone Encounter (Signed)
Message left

## 2017-08-18 NOTE — Telephone Encounter (Signed)
Message left

## 2017-08-20 NOTE — Telephone Encounter (Signed)
TC reviewed can sometimes happen with newborn arousal. White nipple discharge, spots on bra, no pink, red d/c instructed to call if increase or change or persists

## 2017-08-28 DIAGNOSIS — H5213 Myopia, bilateral: Secondary | ICD-10-CM | POA: Diagnosis not present

## 2017-08-28 DIAGNOSIS — H52213 Irregular astigmatism, bilateral: Secondary | ICD-10-CM | POA: Diagnosis not present

## 2017-09-04 ENCOUNTER — Encounter (INDEPENDENT_AMBULATORY_CARE_PROVIDER_SITE_OTHER): Payer: BLUE CROSS/BLUE SHIELD | Admitting: Ophthalmology

## 2017-10-23 DIAGNOSIS — Z3042 Encounter for surveillance of injectable contraceptive: Secondary | ICD-10-CM | POA: Diagnosis not present

## 2017-11-01 ENCOUNTER — Telehealth: Payer: Self-pay | Admitting: *Deleted

## 2017-11-01 ENCOUNTER — Encounter: Payer: Self-pay | Admitting: Gynecology

## 2017-11-01 ENCOUNTER — Ambulatory Visit: Payer: BLUE CROSS/BLUE SHIELD | Admitting: Gynecology

## 2017-11-01 VITALS — BP 130/80

## 2017-11-01 DIAGNOSIS — N760 Acute vaginitis: Secondary | ICD-10-CM

## 2017-11-01 DIAGNOSIS — N898 Other specified noninflammatory disorders of vagina: Secondary | ICD-10-CM

## 2017-11-01 DIAGNOSIS — R350 Frequency of micturition: Secondary | ICD-10-CM

## 2017-11-01 LAB — WET PREP FOR TRICH, YEAST, CLUE

## 2017-11-01 MED ORDER — METRONIDAZOLE 0.75 % VA GEL: 1.0000 | Freq: Every day | VAGINAL | 0 refills | Status: DC

## 2017-11-01 MED ORDER — CLINDAMYCIN PHOSPHATE 2 % VA CREA: 1.0000 | TOPICAL_CREAM | Freq: Every day | VAGINAL | 0 refills | Status: DC

## 2017-11-01 NOTE — Telephone Encounter (Signed)
Can try MetroGel.  1 applicator gently at bedtime x 5 nights

## 2017-11-01 NOTE — Patient Instructions (Signed)
USE THE VAGINAL CREAM NIGHTLY FOR 7 NIGHTS

## 2017-11-01 NOTE — Telephone Encounter (Signed)
Patient was prescribed  Cleocin vaginal cream nightly x7 nights today, states the medication is too expensive, asked if another can be prescribed? Please advise

## 2017-11-01 NOTE — Progress Notes (Signed)
    Kimberly [name] [birth year] [id number]        31 y.o.  G1P0102 presents with a week or so of vaginal discharge and odor.  Notes the discharge is light but abnormal for her.  No itching.  Has been treated for bacterial vaginosis historically twice this past year.  Having some urinary frequency.  No dysuria urgency low back pain fever or chills.  No nausea vomiting diarrhea constipation.  Past medical history,surgical history, problem list, medications, allergies, family history and social history were all reviewed and documented in the EPIC chart.  Directed ROS with pertinent positives and negatives documented in the history of present illness/assessment and plan.  Exam: [name] assistant Vitals:   11/01/17 1200  BP: 130/80   General appearance:  Normal Spine straight without CVA tenderness Abdomen soft nontender without masses guarding rebound Pelvic external BUS vagina with slight erythema and scant discharge.  Bimanual without masses tenderness   Assessment/Plan:  31 y.o. [id number] with history and exam as above.  Wet prep is unremarkable.  Given history suspect low-grade bacterial vaginosis.  Had been treated with Flagyl twice this past year.  Will treat with Cleocin vaginal cream nightly x7 nights.  We will follow-up if symptoms persist, worsen or recur.  Having some urinary frequency.  Urinalysis is contaminated.  Will culture and treat if predominant organism grows.  If not then will monitor and as long as all of her symptoms resolved and will follow expectantly.    Anastasio Auerbach MD, 12:26 PM 11/01/2017

## 2017-11-01 NOTE — Addendum Note (Signed)
Addended by: Janalyn Harder A on: 11/01/2017 05:00 PM   Modules accepted: Orders

## 2017-11-01 NOTE — Addendum Note (Signed)
Addended by: Lorine Bears on: 11/01/2017 12:44 PM   Modules accepted: Orders

## 2017-11-01 NOTE — Telephone Encounter (Signed)
Patient informed. Rx sent 

## 2017-11-02 NOTE — Addendum Note (Signed)
Addended by: Joaquin Music on: 11/02/2017 08:58 AM   Modules accepted: Orders

## 2017-11-03 LAB — URINALYSIS W MICROSCOPIC + REFLEX CULTURE
BILIRUBIN URINE: NEGATIVE
Glucose, UA: NEGATIVE
Hyaline Cast: NONE SEEN /LPF
KETONES UR: NEGATIVE
Leukocyte Esterase: NEGATIVE
NITRITES URINE, INITIAL: NEGATIVE
PH: 5.5 (ref 5.0–8.0)
Protein, ur: NEGATIVE
Specific Gravity, Urine: 1.025 (ref 1.001–1.03)

## 2017-11-03 LAB — URINE CULTURE
MICRO NUMBER:: 81452044
RESULT: NO GROWTH
SPECIMEN QUALITY: ADEQUATE

## 2017-11-03 LAB — TEST AUTHORIZATION

## 2017-11-03 LAB — NO CULTURE INDICATED

## 2018-01-03 ENCOUNTER — Ambulatory Visit (HOSPITAL_COMMUNITY)
Admission: EM | Admit: 2018-01-03 | Discharge: 2018-01-03 | Disposition: A | Discharge status: Home / Self Care | Payer: BLUE CROSS/BLUE SHIELD | Attending: Family Medicine | Admitting: Family Medicine

## 2018-01-03 ENCOUNTER — Encounter (HOSPITAL_COMMUNITY): Payer: Self-pay | Admitting: Emergency Medicine

## 2018-01-03 DIAGNOSIS — J0110 Acute frontal sinusitis, unspecified: Secondary | ICD-10-CM

## 2018-01-03 MED ORDER — CEFDINIR 300 MG PO CAPS: 300.0000 mg | ORAL_CAPSULE | Freq: Two times a day (BID) | ORAL | 0 refills | Status: DC

## 2018-01-03 NOTE — ED Triage Notes (Signed)
PT reports sinus pain and pressure for over 1 week. PT reports bilateral ear pain, history of bad ear infections. Reports fever, bodyaches started Saturday.

## 2018-01-03 NOTE — ED Provider Notes (Signed)
[facility]   [id number] 01/03/18 Arrival Time: 4098  ASSESSMENT & PLAN:  1. Acute non-recurrent frontal sinusitis    Meds ordered this encounter  Medications  . cefdinir (OMNICEF) 300 MG capsule    Sig: Take 1 capsule (300 mg total) by mouth 2 (two) times daily.    Dispense:  20 capsule    Refill:  0   Discussed typical duration of symptoms. OTC symptom care as needed. Ensure adequate fluid intake and rest. May f/u with PCP or here as needed.  Reviewed expectations re: course of current medical issues. Questions answered. Outlined signs and symptoms indicating need for more acute intervention. Patient verbalized understanding. After Visit Summary given.   SUBJECTIVE: History from: patient.  Kimberly [name] is a 32 y.o. female who presents with complaint of nasal congestion, post-nasal drainage, and a persistent dry cough. Onset abrupt, approximately 1 week ago. Sinus pressure bothering her the most; frontal. Also with bilateral ear discomfort, pressure. SOB: none. Wheezing: none. Fever: no. Overall normal PO intake without n/v. Sick contacts: no. OTC treatment: cold medication without relief.   Social History   Tobacco Use  Smoking Status Former Smoker  . Packs/day: 1.00  . Years: 10.00  . Pack years: 10.00  . Types: Cigarettes  . Last attempt to quit: 01/31/2012  . Years since quitting: 5.9  Smokeless Tobacco Never Used    ROS: As per HPI.   OBJECTIVE:  Vitals:   01/03/18 1135  BP: 139/89  Pulse: (!) 117  Resp: 16  Temp: 98.4 F (36.9 C)  TempSrc: Oral  SpO2: 96%  Weight: 160 lb (72.6 kg)     General appearance: alert; appears fatigued HEENT: nasal congestion; clear runny nose; throat irritation secondary to post-nasal drainage; bilateral frontal sinus tenderness to palpation Neck: supple without LAD Lungs: unlabored respirations, symmetrical air entry; cough: absent; no respiratory distress Skin: warm and dry Psychological: alert and  cooperative; normal mood and affect   No Known Allergies  Past Medical History:  Diagnosis Date  . Abnormal Pap smear   . ADD (attention deficit disorder)   . Anxiety   . Family history of anesthesia complication    mother has difficult  time going to sleep  . Headache(784.0)   . HSV-1 infection   . IBS (irritable bowel syndrome)   . Tachycardia    Family History  Problem Relation Age of Onset  . Depression Mother        chronic  . Hearing loss Mother   . Hyperlipidemia Mother   . Vision loss Mother        beachets disease  . COPD Father   . Diabetes Father   . Cancer Maternal Grandmother 75       breast cancer  . Parkinsonism Maternal Grandmother   . Heart disease Maternal Grandmother   . Breast cancer Maternal Grandmother   . Alzheimer's disease Paternal Grandmother   . Heart disease Paternal Grandfather        Died age 34  . Nephrolithiasis Paternal Grandfather   . Heart disease Maternal Grandfather   . Cancer Other 60       breast  . Breast cancer Other    Social History   Socioeconomic History  . Marital status: Married    Spouse name: Not on file  . Number of children: 2  . Years of education: Not on file  . Highest education level: Not on file  Social Needs  . Financial resource strain: Not on  file  . Food insecurity - worry: Not on file  . Food insecurity - inability: Not on file  . Transportation needs - medical: Not on file  . Transportation needs - non-medical: Not on file  Occupational History  . Not on file  Tobacco Use  . Smoking status: Former Smoker    Packs/day: 1.00    Years: 10.00    Pack years: 10.00    Types: Cigarettes    Last attempt to quit: 01/31/2012    Years since quitting: 5.9  . Smokeless tobacco: Never Used  Substance and Sexual Activity  . Alcohol use: No  . Drug use: No  . Sexual activity: Yes    Birth control/protection: Injection    Comment: 1st intercourse 32 yo-More than 5 partners  Other Topics Concern  . Not  on file  Social History Narrative   Lives with husband and three step children.             Vanessa Kick, MD 01/03/18 1209

## 2018-01-15 DIAGNOSIS — Z30014 Encounter for initial prescription of intrauterine contraceptive device: Secondary | ICD-10-CM | POA: Diagnosis not present

## 2018-01-15 DIAGNOSIS — Z3009 Encounter for other general counseling and advice on contraception: Secondary | ICD-10-CM | POA: Diagnosis not present

## 2018-01-15 DIAGNOSIS — Z30430 Encounter for insertion of intrauterine contraceptive device: Secondary | ICD-10-CM | POA: Diagnosis not present

## 2018-02-27 DIAGNOSIS — B009 Herpesviral infection, unspecified: Secondary | ICD-10-CM | POA: Diagnosis not present

## 2018-02-27 DIAGNOSIS — Z30431 Encounter for routine checking of intrauterine contraceptive device: Secondary | ICD-10-CM | POA: Diagnosis not present

## 2018-02-27 DIAGNOSIS — Z3009 Encounter for other general counseling and advice on contraception: Secondary | ICD-10-CM | POA: Diagnosis not present

## 2018-03-05 ENCOUNTER — Encounter (INDEPENDENT_AMBULATORY_CARE_PROVIDER_SITE_OTHER): Payer: Self-pay

## 2018-03-05 ENCOUNTER — Encounter: Payer: Self-pay | Admitting: Gastroenterology

## 2018-03-05 ENCOUNTER — Other Ambulatory Visit (INDEPENDENT_AMBULATORY_CARE_PROVIDER_SITE_OTHER): Payer: BLUE CROSS/BLUE SHIELD

## 2018-03-05 ENCOUNTER — Ambulatory Visit: Payer: BLUE CROSS/BLUE SHIELD | Admitting: Gastroenterology

## 2018-03-05 VITALS — BP 128/80 | HR 127 | Ht 67.0 in | Wt 158.0 lb

## 2018-03-05 DIAGNOSIS — K625 Hemorrhage of anus and rectum: Secondary | ICD-10-CM | POA: Diagnosis not present

## 2018-03-05 DIAGNOSIS — K529 Noninfective gastroenteritis and colitis, unspecified: Secondary | ICD-10-CM | POA: Diagnosis not present

## 2018-03-05 DIAGNOSIS — K6289 Other specified diseases of anus and rectum: Secondary | ICD-10-CM | POA: Diagnosis not present

## 2018-03-05 LAB — CBC
HCT: 40.8 % (ref 36.0–46.0)
Hemoglobin: 14 g/dL (ref 12.0–15.0)
MCHC: 34.3 g/dL (ref 30.0–36.0)
MCV: 106.5 fl — AB (ref 78.0–100.0)
Platelets: 340 10*3/uL (ref 150.0–400.0)
RBC: 3.83 Mil/uL — AB (ref 3.87–5.11)
RDW: 15.2 % (ref 11.5–15.5)
WBC: 10.5 10*3/uL (ref 4.0–10.5)

## 2018-03-05 LAB — SEDIMENTATION RATE: SED RATE: 14 mm/h (ref 0–20)

## 2018-03-05 MED ORDER — HYOSCYAMINE SULFATE 0.125 MG SL SUBL: 0.1250 mg | SUBLINGUAL_TABLET | Freq: Four times a day (QID) | SUBLINGUAL | 2 refills | Status: DC | PRN

## 2018-03-05 NOTE — Patient Instructions (Addendum)
If you are age 34 or older, your body mass index should be between 23-30. Your Body mass index is 24.75 kg/m. If this is out of the aforementioned range listed, please consider follow up with your Primary Care Provider.  If you are age 80 or younger, your body mass index should be between 19-25. Your Body mass index is 24.75 kg/m. If this is out of the aformentioned range listed, please consider follow up with your Primary Care Provider.   Please follow up in 6 weeks.   Your provider has requested that you go to the basement level for lab work before leaving today. Press "B" on the elevator. The lab is located at the first door on the left as you exit the elevator.   Thank you for choosing [facility]  Dr Wilfrid Lund III

## 2018-03-05 NOTE — Progress Notes (Signed)
[facility] Consult Note:  History: Kimberly [name] 03/05/2018  Referring physician: [name], MD  Reason for consult/chief complaint: Diarrhea and Hemorrhoids   Subjective  HPI:  This is a 32 year old woman referred by primary care to see me for abdominal pain, rectal pain and diarrhea.  She was last seen by Dr. Maurene Capes in April 2014 for dyspepsia occurring 5 to 6 months postpartum.  Ultrasound at that time was normal.  Dr. Ricky Stabs note indicates she had also seen this patient about 10 years prior to that for IBS.  [name] reports that at that time she was troubled by severe chronic constipation. She sees me now for about 2 to 3 years of frequent diarrhea, sometimes up to 15 times per day including nocturnal bowel movements.  In fact, she can hardly recall any formed stools in the last few years.  This causes anal pain and burning and exacerbation of what she feels are hemorrhoids.  There is occasional blood on the paper, rarely frank rectal bleeding.  She has occasional vomiting which she feels as if "food is not passing".  She has pain in the anorectal area with bowel movements most often, occasionally crampy lower abdominal pain.  ROS:  Review of Systems  Constitutional: Negative for appetite change and unexpected weight change.  HENT: Negative for mouth sores and voice change.   Eyes: Negative for pain and redness.  Respiratory: Negative for cough and shortness of breath.   Cardiovascular: Negative for chest pain and palpitations.  Genitourinary: Negative for dysuria and hematuria.  Musculoskeletal: Negative for arthralgias and myalgias.  Skin: Negative for pallor and rash.  Neurological: Negative for weakness and headaches.  Hematological: Negative for adenopathy.     Past Medical History: Past Medical History:  Diagnosis Date  . Abnormal Pap smear   . ADD (attention deficit disorder)   . Anxiety   . Family history of anesthesia complication      mother has difficult  time going to sleep  . Headache(784.0)   . HSV-1 infection   . IBS (irritable bowel syndrome)   . Tachycardia      Past Surgical History: Past Surgical History:  Procedure Laterality Date  . BREAST BIOPSY Right   . BREAST SURGERY     Biopsy-benign  . CESAREAN SECTION  09/13/2012   Procedure: CESAREAN SECTION;  Surgeon: Lavonia Drafts, MD;  Location: [facility];  Service: Obstetrics;  Laterality: N/A;  . KYPHOPLASTY N/A 08/27/2013   Procedure: Thoracic Twelve Kyphoplasty;  Surgeon: Erline Levine, MD;  Location: [facility];  Service: Neurosurgery;  Laterality: N/A;  T12 Kyphoplasty  . WISDOM TOOTH EXTRACTION     x4  . WRIST SURGERY Right 07-23-2013     Family History: Family History  Problem Relation Age of Onset  . Depression Mother        chronic  . Hearing loss Mother   . Hyperlipidemia Mother   . Vision loss Mother        beachets disease  . COPD Father   . Diabetes Father   . Cancer Maternal Grandmother 35       breast cancer  . Parkinsonism Maternal Grandmother   . Heart disease Maternal Grandmother   . Breast cancer Maternal Grandmother   . Alzheimer's disease Paternal Grandmother   . Heart disease Paternal Grandfather        Died age 74  . Nephrolithiasis Paternal Grandfather   . Heart disease Maternal Grandfather   . Cancer Other 60  breast  . Breast cancer Other    A first cousin has ulcerative colitis and that cousin's father has Crohn's disease  Social History: Social History   Socioeconomic History  . Marital status: Married    Spouse name: Not on file  . Number of children: 2  . Years of education: Not on file  . Highest education level: Not on file  Occupational History  . Not on file  Social Needs  . Financial resource strain: Not on file  . Food insecurity:    Worry: Not on file    Inability: Not on file  . Transportation needs:    Medical: Not on file    Non-medical: Not on file  Tobacco Use  .  Smoking status: Former Smoker    Packs/day: 1.00    Years: 10.00    Pack years: 10.00    Types: Cigarettes    Last attempt to quit: 01/31/2012    Years since quitting: 6.0  . Smokeless tobacco: Never Used  Substance and Sexual Activity  . Alcohol use: No  . Drug use: No  . Sexual activity: Yes    Birth control/protection: Injection    Comment: 1st intercourse 32 yo-More than 5 partners  Lifestyle  . Physical activity:    Days per week: Not on file    Minutes per session: Not on file  . Stress: Not on file  Relationships  . Social connections:    Talks on phone: Not on file    Gets together: Not on file    Attends religious service: Not on file    Active member of club or organization: Not on file    Attends meetings of clubs or organizations: Not on file    Relationship status: Not on file  Other Topics Concern  . Not on file  Social History Narrative   Lives with husband and three step children.     Several Dr. Samson Frederic per day  Allergies: No Known Allergies  Outpatient Meds: Current Outpatient Medications  Medication Sig Dispense Refill  . loperamide (IMODIUM) 2 MG capsule Take by mouth as needed for diarrhea or loose stools.    . Multiple Vitamin (MULTIVITAMIN) tablet Take 1 tablet by mouth daily.    . hyoscyamine (LEVSIN SL) 0.125 MG SL tablet Place 1 tablet (0.125 mg total) under the tongue every 6 (six) hours as needed. 452 tablet 2   No current facility-administered medications for this visit.       ___________________________________________________________________ Objective   Exam:  BP 128/80   Pulse (!) 127   Ht 5\' 7"  (1.702 m)   Wt 158 lb (71.7 kg)   BMI 24.75 kg/m    General: this is a(n) pleasant and well-appearing woman  Eyes: sclera anicteric, no redness  ENT: oral mucosa moist without lesions, no cervical or supraclavicular lymphadenopathy, good dentition  CV: RRR without murmur, S1/S2, no JVD, no peripheral edema  Resp: clear to  auscultation bilaterally, normal RR and effort noted  GI: soft, no tenderness, with active bowel sounds. No guarding or palpable organomegaly noted.  Skin; warm and dry, no rash or jaundice noted  Neuro: awake, alert and oriented x 3. Normal gross motor function and fluent speech Rectal: Normal external, no perianal disease, tenderness with rectal exam, no palpable or visible fissure.  No palpable internal lesions.   Assessment: Encounter Diagnoses  Name Primary?  . Chronic diarrhea Yes  . Rectal bleeding   . Anal pain     Overall, I  am most suspicious of severe IBS, though the nocturnal diarrhea is uncommon with that.  She has second-degree relatives with she is agreeable after thorough discussion of procedure and risks.  She has a family history of inflammatory bowel disease, so that must be considered, and I encouraged her to have a colonoscopy.   She was uncertain she felt quite ready to do have a colonoscopy at this point. There is no unanticipated weight loss to suggest a malabsorptive condition.  Plan:  CBC, CMP, TSH, TTG antibody A trial of hyoscyamine was given Follow-up with me in about 6 weeks or sooner as needed.  I suspect she will need a colonoscopy unless she is substantially improved at that time.  Thank you for the courtesy of this consult.  Please call me with any questions or concerns.  [name]  CC: [name], MD

## 2018-03-06 LAB — COMPREHENSIVE METABOLIC PANEL
ALT: 29 U/L (ref 0–35)
AST: 46 U/L — AB (ref 0–37)
Albumin: 4.4 g/dL (ref 3.5–5.2)
Alkaline Phosphatase: 72 U/L (ref 39–117)
BUN: 5 mg/dL — ABNORMAL LOW (ref 6–23)
CALCIUM: 9.4 mg/dL (ref 8.4–10.5)
CHLORIDE: 104 meq/L (ref 96–112)
CO2: 22 meq/L (ref 19–32)
CREATININE: 0.55 mg/dL (ref 0.40–1.20)
GFR: 134.53 mL/min (ref >60.00)
Glucose, Bld: 92 mg/dL (ref 70–99)
POTASSIUM: 3.9 meq/L (ref 3.5–5.1)
SODIUM: 141 meq/L (ref 135–145)
Total Bilirubin: 0.3 mg/dL (ref 0.2–1.2)
Total Protein: 7.3 g/dL (ref 6.0–8.3)

## 2018-03-06 LAB — IGA: IGA: 193 mg/dL (ref 68–378)

## 2018-03-06 LAB — TSH: TSH: 1.81 u[IU]/mL (ref 0.35–4.50)

## 2018-03-06 LAB — TISSUE TRANSGLUTAMINASE, IGA: (tTG) Ab, IgA: 1 U/mL

## 2018-04-17 ENCOUNTER — Ambulatory Visit: Payer: BLUE CROSS/BLUE SHIELD | Admitting: Gastroenterology

## 2018-05-07 DIAGNOSIS — R000 Tachycardia, unspecified: Secondary | ICD-10-CM | POA: Diagnosis not present

## 2018-05-07 DIAGNOSIS — Z720 Tobacco use: Secondary | ICD-10-CM | POA: Diagnosis not present

## 2018-05-07 DIAGNOSIS — N6452 Nipple discharge: Secondary | ICD-10-CM | POA: Diagnosis not present

## 2018-05-07 DIAGNOSIS — Z30431 Encounter for routine checking of intrauterine contraceptive device: Secondary | ICD-10-CM | POA: Diagnosis not present

## 2018-05-07 DIAGNOSIS — R635 Abnormal weight gain: Secondary | ICD-10-CM | POA: Diagnosis not present

## 2018-05-07 DIAGNOSIS — Z01419 Encounter for gynecological examination (general) (routine) without abnormal findings: Secondary | ICD-10-CM | POA: Diagnosis not present

## 2018-05-07 DIAGNOSIS — R748 Abnormal levels of other serum enzymes: Secondary | ICD-10-CM | POA: Diagnosis not present

## 2018-05-07 DIAGNOSIS — R232 Flushing: Secondary | ICD-10-CM | POA: Diagnosis not present

## 2018-05-07 DIAGNOSIS — Z3009 Encounter for other general counseling and advice on contraception: Secondary | ICD-10-CM | POA: Diagnosis not present

## 2018-06-01 DIAGNOSIS — E782 Mixed hyperlipidemia: Secondary | ICD-10-CM | POA: Diagnosis not present

## 2018-06-01 DIAGNOSIS — R002 Palpitations: Secondary | ICD-10-CM | POA: Diagnosis not present

## 2018-06-01 DIAGNOSIS — Z1389 Encounter for screening for other disorder: Secondary | ICD-10-CM | POA: Diagnosis not present

## 2018-06-01 DIAGNOSIS — Z0001 Encounter for general adult medical examination with abnormal findings: Secondary | ICD-10-CM | POA: Diagnosis not present

## 2018-06-01 DIAGNOSIS — Z6824 Body mass index (BMI) 24.0-24.9, adult: Secondary | ICD-10-CM | POA: Diagnosis not present

## 2018-06-22 DIAGNOSIS — Z1389 Encounter for screening for other disorder: Secondary | ICD-10-CM | POA: Diagnosis not present

## 2018-06-29 ENCOUNTER — Encounter (INDEPENDENT_AMBULATORY_CARE_PROVIDER_SITE_OTHER): Payer: Self-pay

## 2018-06-29 ENCOUNTER — Encounter: Payer: Self-pay | Admitting: Gastroenterology

## 2018-06-29 ENCOUNTER — Ambulatory Visit: Payer: BLUE CROSS/BLUE SHIELD | Admitting: Gastroenterology

## 2018-06-29 VITALS — BP 114/80 | HR 74 | Ht 67.5 in | Wt 160.0 lb

## 2018-06-29 DIAGNOSIS — K529 Noninfective gastroenteritis and colitis, unspecified: Secondary | ICD-10-CM | POA: Diagnosis not present

## 2018-06-29 DIAGNOSIS — R748 Abnormal levels of other serum enzymes: Secondary | ICD-10-CM

## 2018-06-29 DIAGNOSIS — K625 Hemorrhage of anus and rectum: Secondary | ICD-10-CM

## 2018-06-29 NOTE — Patient Instructions (Signed)
If you are age 34 or older, your body mass index should be between 23-30. Your Body mass index is 24.69 kg/m. If this is out of the aforementioned range listed, please consider follow up with your Primary Care Provider.  If you are age 40 or younger, your body mass index should be between 19-25. Your Body mass index is 24.69 kg/m. If this is out of the aformentioned range listed, please consider follow up with your Primary Care Provider.   We have given you samples of the following medication to take: Viberzi 75 mg sample kit.  Take one Viberzi my mouth in the evening.   You have been scheduled for an abdominal ultrasound at [facility] (1st floor of hospital) on 07-11-2018 at 830am. Please arrive 15 minutes prior to your appointment for registration. Make certain not to have anything to eat or drink 6 hours prior to your appointment. Should you need to reschedule your appointment, please contact radiology at [phone]. This test typically takes about 30 minutes to perform.   It was a pleasure to see you today!  Dr. Loletha Carrow

## 2018-06-29 NOTE — Progress Notes (Signed)
[name] Progress Note  Chief Complaint: Elevated LFTs  Subjective  History:  This is a 32 year old woman who saw me several months ago for severe chronic diarrhea with rectal bleeding.  Lab work was done, I recommended a colonoscopy, but she wanted to give that more consideration.  We did not hear back from her after that, and this is the first visit since the initial office consult in late April.  Kimberly [name] reports that the diarrhea is about the same as before, with about 6 BMs per day, mostly in the morning.  She was not able to take the hyoscyamine because insurance would not cover it.  She has not had any rectal bleeding since last seeing me.  She still does not want to pursue a colonoscopy.  Primary care center to see [name] for abnormal LFTs which were not available during the visit, but was sent to [name] later in the day.  She seemed to recall it was an elevated AST or ALT into the 50s.  She has a previous history of heavy alcohol use by her own report,, less so in the last few years.  ROS: Cardiovascular:  no chest pain Respiratory: no dyspnea Remainder of systems negative except as above The patient's Past Medical, Family and Social History were reviewed and are on file in the EMR.  Objective:  Med list reviewed  Current Outpatient Medications:  .  levonorgestrel (MIRENA) 20 MCG/24HR IUD, 1 each by Intrauterine route once., Disp: , Rfl:  .  loperamide (IMODIUM) 2 MG capsule, Take by mouth as needed for diarrhea or loose stools., Disp: , Rfl:  .  Multiple Vitamin (MULTIVITAMIN) tablet, Take 1 tablet by mouth daily., Disp: , Rfl:  .  Omega-3 Fatty Acids (FISH OIL) 1000 MG CAPS, Take by mouth daily., Disp: , Rfl:    Vital signs in last 24 hrs: Vitals:   06/29/18 1456  BP: 114/80  Pulse: 74    Physical Exam  Well-appearing  HEENT: sclera anicteric, oral mucosa moist without lesions  Neck: supple, no thyromegaly, JVD or lymphadenopathy  Cardiac: RRR without  murmurs, S1S2 heard, no peripheral edema  Pulm: clear to auscultation bilaterally, normal RR and effort noted  Abdomen: soft, no tenderness, with active bowel sounds. No guarding or palpable hepatosplenomegaly.  Skin; warm and dry, no jaundice or rash  Recent Labs:  CBC Latest Ref Rng & Units 03/05/2018 08/26/2013 09/14/2012  WBC 4.0 - 10.5 K/uL 10.5 9.3 13.0(H)  Hemoglobin 12.0 - 15.0 g/dL 14.0 14.2 10.9(L)  Hematocrit 36.0 - 46.0 % 40.8 41.2 32.0(L)  Platelets 150.0 - 400.0 K/uL 340.0 262 165   CMP Latest Ref Rng & Units 03/05/2018 08/26/2013 04/27/2009  Glucose 70 - 99 mg/dL 92 109(H) 96  BUN 6 - 23 mg/dL 5(L) 10 9  Creatinine 0.40 - 1.20 mg/dL 0.55 0.57 0.8  Sodium 135 - 145 mEq/L 141 137 143  Potassium 3.5 - 5.1 mEq/L 3.9 3.2(L) 3.8  Chloride 96 - 112 mEq/L 104 104 110  CO2 19 - 32 mEq/L 22 22 -  Calcium 8.4 - 10.5 mg/dL 9.4 9.3 -  Total Protein 6.0 - 8.3 g/dL 7.3 - -  Total Bilirubin 0.2 - 1.2 mg/dL 0.3 - -  Alkaline Phos 39 - 117 U/L 72 - -  AST 0 - 37 U/L 46(H) - -  ALT 0 - 35 U/L 29 - -   ESR 14 TSH 1.81  Primary care labs from 06/22/2018: WBC 12.7, hemoglobin 15.5, platelets 276  GGT 327 Albumin 4.1, total bilirubin 0.6, alkaline phosphatase 99, AST 56, ALT 45 Hepatitis a IgM negative, hepatitis B surface antigen and core antibody IgM negative, hepatitis C antibody negative  @ASSESSMENTPLANBEGIN @ Assessment: Encounter Diagnoses  Name Primary?  . Chronic diarrhea Yes  . Rectal bleeding   . Abnormal transaminases    She still seems most likely to have IBS, but I hope that at some point she will reconsider a colonoscopy to rule out IBD.  I gave her a trial of Viberzi samples 75 mg, take 1 each evening.  She is most bothered by the diarrhea that occurs overnight or early morning.  Many years ago she reports having had constipation predominant IBS, so I will be cautious with the dose of this medicine.  Her elevated LFTs seem likely to be from fatty liver, since she  also describes elevated lipids (though not included with the labs sent by primary care), as well as excess alcohol use as evidenced by elevated GGT with normal alkaline phosphatase.  Plan: Right upper quadrant ultrasound Decrease alcohol intake Call us back with report on how Viberzi worked whether she would like to continue it.   Total time 30 minutes, over half spent face-to-face with patient in counseling and coordination of care.   [name]

## 2018-07-03 DIAGNOSIS — O924 Hypogalactia: Secondary | ICD-10-CM | POA: Diagnosis not present

## 2018-07-11 ENCOUNTER — Ambulatory Visit (HOSPITAL_COMMUNITY)
Admission: RE | Admit: 2018-07-11 | Discharge: 2018-07-11 | Disposition: A | Discharge status: Home / Self Care | Payer: BLUE CROSS/BLUE SHIELD | Source: Ambulatory Visit | Attending: Gastroenterology | Admitting: Gastroenterology

## 2018-07-11 DIAGNOSIS — K625 Hemorrhage of anus and rectum: Secondary | ICD-10-CM | POA: Diagnosis not present

## 2018-07-11 DIAGNOSIS — R748 Abnormal levels of other serum enzymes: Secondary | ICD-10-CM | POA: Insufficient documentation

## 2018-07-11 DIAGNOSIS — R7989 Other specified abnormal findings of blood chemistry: Secondary | ICD-10-CM | POA: Diagnosis not present

## 2018-07-11 DIAGNOSIS — K529 Noninfective gastroenteritis and colitis, unspecified: Secondary | ICD-10-CM | POA: Insufficient documentation

## 2018-07-12 ENCOUNTER — Other Ambulatory Visit: Payer: Self-pay

## 2018-07-12 MED ORDER — ELUXADOLINE 75 MG PO TABS: 75.0000 mg | ORAL_TABLET | ORAL | 3 refills | Status: DC

## 2018-08-14 DIAGNOSIS — Z6825 Body mass index (BMI) 25.0-25.9, adult: Secondary | ICD-10-CM | POA: Diagnosis not present

## 2018-08-14 DIAGNOSIS — Z1389 Encounter for screening for other disorder: Secondary | ICD-10-CM | POA: Diagnosis not present

## 2018-08-14 DIAGNOSIS — Z658 Other specified problems related to psychosocial circumstances: Secondary | ICD-10-CM | POA: Diagnosis not present

## 2018-08-14 DIAGNOSIS — E663 Overweight: Secondary | ICD-10-CM | POA: Diagnosis not present

## 2018-08-14 DIAGNOSIS — R945 Abnormal results of liver function studies: Secondary | ICD-10-CM | POA: Diagnosis not present

## 2018-08-28 ENCOUNTER — Telehealth: Payer: Self-pay

## 2018-08-28 NOTE — Telephone Encounter (Signed)
  Minus Breeding, MD  Newt Minion, RN        OK   Previous Messages       Josue Hector, MD  Newt Minion, RN        That's fine   Previous Messages    ----- Message -----  From: Newt Minion, RN  Sent: 08/28/2018 12:32 PM EDT  To: Minus Breeding, MD, Josue Hector, MD, *  Subject: Transfer Providers                Pt saw Nishan on 12/25/2017 she would like to return [facility] as she saw him in 2013. Please advise if you are okay with the pt transfer. She is being referred back to our practice from her Primary @ [place] for Palpitations.   Thanks,   Bernardo Heater, RN

## 2018-09-22 NOTE — Progress Notes (Signed)
Cardiology Office Note   Date:  09/24/2018   ID:  Kimberly [name], DOB 28-Mar-1986, MRN [id number]  PCP:  [name], MD  Cardiologist:   No primary care provider on file.   Chief Complaint  Patient presents with  . Palpitations      History of Present Illness: Kimberly [name] is a 32 y.o. female who presents for palpitations.  I saw her in 2013.  She saw Dr. Johnsie Cancel last year.   48 hour Holter demonstrated sinus rhythm with normal circadian rhythm.  There were no sustained arrhythmias.   Echo was normal in Feb of last year.   She returns to get another opinion on this.  She wants to wear a 30-day event monitor because she we did not capture the episodes on the 48-hour monitor.  They are still happening.  They happen when she walks.  She will feel a thump in menopause and it feels like her heart strain to catch up.  It happens at rest.  She cannot associate it with anything.  She is reduced her caffeine dramatically.  She cannot bring these on.  She denies any chest pressure, neck or arm discomfort.  She is not having any new shortness of breath, PND or orthopnea.   Past Medical History:  Diagnosis Date  . Abnormal Pap smear   . ADD (attention deficit disorder)   . Anxiety   . Family history of anesthesia complication    mother has difficult  time going to sleep  . Headache(784.0)   . HSV-1 infection   . IBS (irritable bowel syndrome)   . Tachycardia     Past Surgical History:  Procedure Laterality Date  . BREAST BIOPSY Right   . BREAST SURGERY     Biopsy-benign  . CESAREAN SECTION  09/13/2012   Procedure: CESAREAN SECTION;  Surgeon: Lavonia Drafts, MD;  Location: [facility];  Service: Obstetrics;  Laterality: N/A;  . KYPHOPLASTY N/A 08/27/2013   Procedure: Thoracic Twelve Kyphoplasty;  Surgeon: Erline Levine, MD;  Location: [facility];  Service: Neurosurgery;  Laterality: N/A;  T12 Kyphoplasty  . WISDOM TOOTH EXTRACTION     x4  . WRIST SURGERY Right  07-23-2013     Current Outpatient Medications  Medication Sig Dispense Refill  . Eluxadoline (VIBERZI) 75 MG TABS Take 1 tablet by mouth as needed.    Marland Kitchen levonorgestrel (MIRENA) 20 MCG/24HR IUD 1 each by Intrauterine route once.    . loperamide (IMODIUM) 2 MG capsule Take by mouth as needed for diarrhea or loose stools.    . Multiple Vitamin (MULTIVITAMIN) tablet Take 1 tablet by mouth daily.    . Omega-3 Fatty Acids (FISH OIL) 1000 MG CAPS Take by mouth daily.     No current facility-administered medications for this visit.     Allergies:   Patient has no known allergies.   ROS:  Please see the history of present illness.   Otherwise, review of systems are positive for none.   All other systems are reviewed and negative.    PHYSICAL EXAM: VS:  BP 118/80   Pulse (!) 115   Ht 5\' 7"  (1.702 m)   Wt 159 lb 3.2 oz (72.2 kg)   BMI 24.93 kg/m  , BMI Body mass index is 24.93 kg/m. GENERAL:  Well appearing HEENT:  Pupils equal round and reactive, fundi not visualized, oral mucosa unremarkable NECK:  No jugular venous distention, waveform within normal limits, carotid upstroke brisk and symmetric, no bruits,  no thyromegaly LYMPHATICS:  No cervical, inguinal adenopathy LUNGS:  Clear to auscultation bilaterally BACK:  No CVA tenderness CHEST:  Unremarkable HEART:  PMI not displaced or sustained,S1 and S2 within normal limits, no S3, no S4, no clicks, no rubs, no murmurs ABD:  Flat, positive bowel sounds normal in frequency in pitch, no bruits, no rebound, no guarding, no midline pulsatile mass, no hepatomegaly, no splenomegaly EXT:  2 plus pulses throughout, no edema, no cyanosis no clubbing SKIN:  No rashes no nodules NEURO:  Cranial nerves II through XII grossly intact, motor grossly intact throughout PSYCH:  Cognitively intact, oriented to person place and time    EKG:  EKG is ordered today. The ekg ordered today demonstrates sinus tachycardia, rate 115, low voltage limb leads, poor  anterior R wave progression.  Voltage is lower than previous  Recent Labs: 03/05/2018: ALT 29; BUN 5; Creatinine, Ser 0.55; Hemoglobin 14.0; Platelets 340.0; Potassium 3.9; Sodium 141; TSH 1.81    Lipid Panel No results found for: CHOL, TRIG, HDL, CHOLHDL, VLDL, LDLCALC, LDLDIRECT    Wt Readings from Last 3 Encounters:  09/24/18 159 lb 3.2 oz (72.2 kg)  06/29/18 160 lb (72.6 kg)  03/05/18 158 lb (71.7 kg)      Other studies Reviewed: Additional studies/ records that were reviewed today include: Holter. Review of the above records demonstrates:  Please see elsewhere in the note.     ASSESSMENT AND PLAN:  PALPITATIONS: I reviewed her Holter readings.  She continues to have palpitations.  She does not think we captured them on that monitor.  Because of the infrequency of her arrhythmia she will need a 30-day event monitor.  She did have labs to include normal TSH and electrolytes this year.  I will follow-up with her after the event monitor.  ABNORMAL EKG: She does have an abnormal EKG with low voltage that is new compared with previous.  I will check another echocardiogram.   Current medicines are reviewed at length with the patient today.  The patient does not have concerns regarding medicines.  The following changes have been made:  no change  Labs/ tests ordered today include:   Orders Placed This Encounter  Procedures  . CARDIAC EVENT MONITOR  . EKG 12-Lead  . ECHOCARDIOGRAM COMPLETE     Disposition:   FU with after the event monitor.      Signed, Minus Breeding, MD  09/24/2018 1:24 PM    [facility]

## 2018-09-24 ENCOUNTER — Encounter: Payer: Self-pay | Admitting: Cardiology

## 2018-09-24 ENCOUNTER — Ambulatory Visit: Payer: BLUE CROSS/BLUE SHIELD | Admitting: Cardiology

## 2018-09-24 VITALS — BP 118/80 | HR 115 | Ht 67.0 in | Wt 159.2 lb

## 2018-09-24 DIAGNOSIS — R9431 Abnormal electrocardiogram [ECG] [EKG]: Secondary | ICD-10-CM

## 2018-09-24 DIAGNOSIS — R002 Palpitations: Secondary | ICD-10-CM

## 2018-09-24 NOTE — Patient Instructions (Signed)
Medication Instructions:  Continue Current medication If you need a refill on your cardiac medications before your next appointment, please call your pharmacy.  Labwork: None ordered   If you have labs (blood work) drawn today and your tests are completely normal, you will receive your results only by: Marland Kitchen MyChart Message (if you have MyChart) OR . A paper copy in the mail If you have any lab test that is abnormal or we need to change your treatment, we will call you to review the results.  Testing/Procedures: Your physician has requested that you have an echocardiogram. Echocardiography is a painless test that uses sound waves to create images of your heart. It provides your doctor with information about the size and shape of your heart and how well your heart's chambers and valves are working. This procedure takes approximately one hour. There are no restrictions for this procedure.  Your physician has recommended that you wear an event monitor for 30 days. Event monitors are medical devices that record the heart's electrical activity. Doctors most often [name] these monitors to diagnose arrhythmias. Arrhythmias are problems with the speed or rhythm of the heartbeat. The monitor is a small, portable device. You can wear one while you do your normal daily activities. This is usually used to diagnose what is causing palpitations/syncope (passing out).    Follow-Up: You will need a follow up appointment after test.   At [facility], you and your health needs are our priority.  As part of our continuing mission to provide you with exceptional heart care, we have created designated Provider Care Teams.  These Care Teams include your primary Cardiologist (physician) and Advanced Practice Providers (APPs -  Physician Assistants and Nurse Practitioners) who all work together to provide you with the care you need, when you need it.  Thank you for choosing [facility] at [facility]!!

## 2018-09-27 ENCOUNTER — Ambulatory Visit: Payer: BLUE CROSS/BLUE SHIELD | Admitting: Cardiology

## 2018-10-02 ENCOUNTER — Other Ambulatory Visit: Payer: Self-pay

## 2018-10-02 ENCOUNTER — Ambulatory Visit (HOSPITAL_COMMUNITY): Payer: BLUE CROSS/BLUE SHIELD | Attending: Cardiovascular Disease

## 2018-10-02 ENCOUNTER — Ambulatory Visit (INDEPENDENT_AMBULATORY_CARE_PROVIDER_SITE_OTHER): Payer: BLUE CROSS/BLUE SHIELD

## 2018-10-02 DIAGNOSIS — R9431 Abnormal electrocardiogram [ECG] [EKG]: Secondary | ICD-10-CM

## 2018-10-02 DIAGNOSIS — R002 Palpitations: Secondary | ICD-10-CM | POA: Diagnosis not present

## 2018-10-08 ENCOUNTER — Telehealth: Payer: Self-pay | Admitting: Gastroenterology

## 2018-10-09 ENCOUNTER — Telehealth: Payer: Self-pay | Admitting: Gastroenterology

## 2018-10-09 NOTE — Telephone Encounter (Signed)
Left a detailed message for patient explaining her options; awaiting a call back from patient with her decision on being seen in office or treating issue at home for now;

## 2018-10-09 NOTE — Telephone Encounter (Signed)
Patient is now willing to have colonoscopy.  She would like to add EGD.  She reports frequent vomiting and or diarrhea after meals. Please advise if okay to schedule colonoscopy and EGD vs. Other orders.

## 2018-10-09 NOTE — Telephone Encounter (Signed)
If this is hemorrhoidal, I cannot tell if it is prolapsed internal vs external hemorrhoid from her description.   Treatments for those are different. (She did not want to pursue a colonoscopy - see my notes ).  If she does not want to come to office for evaluation, can try sitz bath and OTC preparation H suppository for a few days.  If does not help or changes her mind, please arrange APP appointment for evaluation.

## 2018-10-09 NOTE — Telephone Encounter (Signed)
Called and spoke with patient-patient reports she is having a hemorrhoid issue where the hemorrhoid will not "go down or go away"; patient reports having diarrhea "like usual"-no constipation reported-not taking the imodium-increased oral fluid-patient reports being "nauseated and actually vomiting a few times after eating"; patient is requesting hemorrhoid treatment but not having to come to the office for treatment- taking the Viberzi to decrease likelihood of having a BM; Please advise on next step in plan of care;

## 2018-10-10 ENCOUNTER — Telehealth: Payer: Self-pay | Admitting: Gastroenterology

## 2018-10-10 NOTE — Telephone Encounter (Signed)
Ok to continue with refills on Viberzi 75 mg written as once a day for cost? please advise.

## 2018-10-10 NOTE — Telephone Encounter (Signed)
Pt would like to schd the Colon

## 2018-10-10 NOTE — Telephone Encounter (Signed)
Patient requesting a new prescription of medication viberzi sent into [facility] on [address]. Patient wanting a 30 day supply as one a day so it will be cheaper for her.

## 2018-10-11 NOTE — Telephone Encounter (Deleted)
Pt returned call. She will come by and pick up the samples.

## 2018-10-11 NOTE — Telephone Encounter (Signed)
Pre-visit and colon have been scheduled with the patient.

## 2018-10-11 NOTE — Telephone Encounter (Signed)
Yes, the Viberzi can be refilled at once daily.  Yes, she can be directly booked for EGD and colonoscopy for nausea/vomiting and chronic diarrhea with rectal bleeding.

## 2018-10-11 NOTE — Telephone Encounter (Signed)
Patient wants to schedule a colonoscopy that was recommended back in April. Should she schedule a pre-visit or a office visit as she is having problems with her hemorrhoids.

## 2018-10-12 NOTE — Telephone Encounter (Signed)
Please see additional charting on this patient

## 2018-10-19 ENCOUNTER — Ambulatory Visit: Payer: BLUE CROSS/BLUE SHIELD | Admitting: Physician Assistant

## 2018-10-19 ENCOUNTER — Encounter: Payer: Self-pay | Admitting: Physician Assistant

## 2018-10-19 VITALS — BP 118/62 | HR 132 | Ht 67.0 in | Wt 158.0 lb

## 2018-10-19 DIAGNOSIS — K644 Residual hemorrhoidal skin tags: Secondary | ICD-10-CM | POA: Diagnosis not present

## 2018-10-19 DIAGNOSIS — K219 Gastro-esophageal reflux disease without esophagitis: Secondary | ICD-10-CM

## 2018-10-19 DIAGNOSIS — K625 Hemorrhage of anus and rectum: Secondary | ICD-10-CM

## 2018-10-19 DIAGNOSIS — R112 Nausea with vomiting, unspecified: Secondary | ICD-10-CM

## 2018-10-19 DIAGNOSIS — K529 Noninfective gastroenteritis and colitis, unspecified: Secondary | ICD-10-CM | POA: Diagnosis not present

## 2018-10-19 MED ORDER — HYDROCORTISONE 2.5 % RE CREA: 1.0000 "application " | TOPICAL_CREAM | Freq: Two times a day (BID) | RECTAL | 1 refills | Status: DC

## 2018-10-19 MED ORDER — PANTOPRAZOLE SODIUM 40 MG PO TBEC: 40.0000 mg | DELAYED_RELEASE_TABLET | Freq: Every day | ORAL | 1 refills | Status: DC

## 2018-10-19 MED ORDER — NA SULFATE-K SULFATE-MG SULF 17.5-3.13-1.6 GM/177ML PO SOLN: ORAL | 0 refills | Status: DC

## 2018-10-19 MED ORDER — ONDANSETRON HCL 4 MG PO TABS: ORAL_TABLET | ORAL | 1 refills | Status: DC

## 2018-10-19 NOTE — Patient Instructions (Addendum)
If you are age 34 or older, your body mass index should be between 23-30. Your Body mass index is 24.75 kg/m. If this is out of the aforementioned range listed, please consider follow up with your Primary Care Provider.  If you are age 55 or younger, your body mass index should be between 19-25. Your Body mass index is 24.75 kg/m. If this is out of the aformentioned range listed, please consider follow up with your Primary Care Provider.   You have been scheduled for an endoscopy and colonoscopy. Please follow the written instructions given to you at your visit today. Please pick up your prep supplies at the pharmacy within the next 1-3 days. If you use inhalers (even only as needed), please bring them with you on the day of your procedure. Your physician has requested that you go to www.startemmi.com and enter the access code given to you at your visit today. This web site gives a general overview about your procedure. However, you should still follow specific instructions given to you by our office regarding your preparation for the procedure.  We have sent the following medications to your pharmacy for you to pick up at your convenience: Suprep Pantoprazole Zofran Hydrocortisone cream  You have been given samples of Viberzi.  Call if hemorrhoids worsen.  Thank you for choosing me and [facility].   Ellouise Newer, PA-C

## 2018-10-19 NOTE — Progress Notes (Signed)
Thank you for sending this case to me. I have reviewed the entire note, and the outlined plan seems appropriate.   Henry Danis, MD  

## 2018-10-19 NOTE — Progress Notes (Signed)
Chief Complaint: Discuss endoscopy and colonoscopy  HPI:    Mrs. Martin is a 32 year old female with a past medical history as listed below, known to Dr. Loletha Carrow for her chronic diarrhea, who presents to clinic today to discuss an endoscopy and colonoscopy.    06/29/2018 patient seen by Dr. Loletha Carrow for elevated LFTs.  At that time, it was discussed that she had been seen several months ago for severe chronic diarrhea with rectal bleeding and a colonoscopy was recommended but she did not want to do it at that time.  Described diarrhea was about the same with 6 bowel movements per day mostly in the morning, could not take Hyoscyamine because insurance would not cover it.  She had not had any rectal bleeding.  She still did not want a colonoscopy.  Primary care center described abnormal LFTs.  At time discussed that still most likely this is IBS but again it was recommended she have a colonoscopy.  She is given a trial of Viberzi samples 75 mg nightly.  Discussed her LFTs are likely from fatty liver.  Right upper quadrant ultrasound was ordered.    10/08/2018 patient called to say that her hemorrhoids would not "go down" and it felt like she had a knot in it.  At that time she did not want to come to the office for evaluation and it was suggested she try sitz bath and over-the-counter Preparation H suppository for a few days.  She also reported nausea and vomiting.    10/09/2018 patient wanted to have a colonoscopy wanted to add an EGD given frequent vomiting and or diarrhea after meals.  This was okayed by Dr. Loletha Carrow.    Today, patient explains that over the past week and a half she has had a lot of trouble with her hemorrhoids.  She tells me that she can feel these on the outside and shows me a picture when they were severely inflamed.  Describes feeling as though they were hard at one point.  The worst was on 10/15/2018.  Since then they have remained inflamed and do bother her and throb and do cause her some  discomfort when passing a bowel movement but are not quite as bad.  Patient has been applying Hydrocortisone cream to them once daily as well as using over-the-counter RectiCare cream and doing sitz bath's twice daily. Some bleeding.    Patient reports that her diarrheal stools are much better with Viberzi.  Currently she is only using a half tab every other day.    Continues to describe nausea and vomiting which occurs every morning and every evening.  She tells me that a long time ago she was on Omeprazole 40 mg twice daily as well as Zantac 150 mg twice daily, but recently cut back to just the Zantac and then stopped this when "all the reports came out".  She is not on anything currently. Also describes daily reflux symptoms.    Denies fever, chills or weight loss.      Past Medical History:  Diagnosis Date  . Abnormal Pap smear   . ADD (attention deficit disorder)   . Anxiety   . Family history of anesthesia complication    mother has difficult  time going to sleep  . Headache(784.0)   . HSV-1 infection   . IBS (irritable bowel syndrome)   . Tachycardia     Past Surgical History:  Procedure Laterality Date  . BREAST BIOPSY Right   . BREAST SURGERY  Biopsy-benign  . CESAREAN SECTION  09/13/2012   Procedure: CESAREAN SECTION;  Surgeon: Lavonia Drafts, MD;  Location: [facility];  Service: Obstetrics;  Laterality: N/A;  . KYPHOPLASTY N/A 08/27/2013   Procedure: Thoracic Twelve Kyphoplasty;  Surgeon: Erline Levine, MD;  Location: [facility];  Service: Neurosurgery;  Laterality: N/A;  T12 Kyphoplasty  . WISDOM TOOTH EXTRACTION     x4  . WRIST SURGERY Right 07-23-2013    Current Outpatient Medications  Medication Sig Dispense Refill  . Eluxadoline (VIBERZI) 75 MG TABS Take 1 tablet by mouth as needed.    Marland Kitchen levonorgestrel (MIRENA) 20 MCG/24HR IUD 1 each by Intrauterine route once.    . loperamide (IMODIUM) 2 MG capsule Take by mouth as needed for diarrhea or loose stools.    .  Multiple Vitamin (MULTIVITAMIN) tablet Take 1 tablet by mouth daily.    . Omega-3 Fatty Acids (FISH OIL) 1000 MG CAPS Take by mouth daily.     No current facility-administered medications for this visit.     Allergies as of 10/19/2018  . (No Known Allergies)    Family History  Problem Relation Age of Onset  . Depression Mother        chronic  . Hearing loss Mother   . Hyperlipidemia Mother   . Vision loss Mother        beachets disease  . COPD Father   . Diabetes Father   . Cancer Maternal Grandmother 34       breast cancer  . Parkinsonism Maternal Grandmother   . Heart disease Maternal Grandmother   . Breast cancer Maternal Grandmother   . Alzheimer's disease Paternal Grandmother   . Heart disease Paternal Grandfather        Died age 20  . Nephrolithiasis Paternal Grandfather   . Heart disease Maternal Grandfather   . Cancer Other 60       breast  . Breast cancer Other     Social History   Socioeconomic History  . Marital status: Married    Spouse name: Not on file  . Number of children: 2  . Years of education: Not on file  . Highest education level: Not on file  Occupational History  . Not on file  Social Needs  . Financial resource strain: Not on file  . Food insecurity:    Worry: Not on file    Inability: Not on file  . Transportation needs:    Medical: Not on file    Non-medical: Not on file  Tobacco Use  . Smoking status: Former Smoker    Packs/day: 1.00    Years: 10.00    Pack years: 10.00    Types: Cigarettes    Last attempt to quit: 01/31/2012    Years since quitting: 6.7  . Smokeless tobacco: Never Used  Substance and Sexual Activity  . Alcohol use: No  . Drug use: No  . Sexual activity: Yes    Birth control/protection: I.U.D.    Comment: 1st intercourse 32 yo-More than 5 partners  Lifestyle  . Physical activity:    Days per week: Not on file    Minutes per session: Not on file  . Stress: Not on file  Relationships  . Social  connections:    Talks on phone: Not on file    Gets together: Not on file    Attends religious service: Not on file    Active member of club or organization: Not on file    Attends meetings  of clubs or organizations: Not on file    Relationship status: Not on file  . Intimate partner violence:    Fear of current or ex partner: Not on file    Emotionally abused: Not on file    Physically abused: Not on file    Forced sexual activity: Not on file  Other Topics Concern  . Not on file  Social History Narrative   Lives with husband and three step children.      Review of Systems:    Constitutional: No weight loss, fever or chills Cardiovascular: No chest pain Respiratory: No SOB Gastrointestinal: See HPI and otherwise negative   Physical Exam:  Vital signs: BP 118/62   Pulse (!) 132   Ht 5\' 7"  (1.702 m)   Wt 158 lb (71.7 kg)   BMI 24.75 kg/m   Constitutional:   Pleasant Caucasian female appears to be in NAD, Well developed, Well nourished, alert and cooperative Respiratory: Respirations even and unlabored. Lungs clear to auscultation bilaterally.   No wheezes, crackles, or rhonchi.  Cardiovascular: Normal S1, S2. No MRG. Regular rate and rhythm. No peripheral edema, cyanosis or pallor.  Gastrointestinal:  Soft, nondistended, moderate epigastric ttp. No rebound or guarding. Normal bowel sounds. No appreciable masses or hepatomegaly. Rectal:  External: Inflamed tender external hemorrhoids, non-thrombosed; Internal: increased anal sphincter tone and pain which does not allow for thorough exam Psychiatric: Demonstrates good judgement and reason without abnormal affect or behaviors.  No recent labs or imaging.  Assessment: 1.  Nausea and vomiting: Suspect this is related to gastritis/reflux 2.  GERD: Uncontrolled per patient, previously on twice daily PPI therapy and H2 blocker therapy, currently on nothing 3.  Chronic diarrhea: Thought to be IBS per Dr. Loletha Carrow, some better with  Viberzi 4.  External hemorrhoids and rectal bleeding: Seen at time of exam today, nonthrombosed, suspect they were thrombosed earlier this week, appear soft today and getting somewhat better  Plan: 1.  Discussed with patient that given that it has been over 72 hours since her hemorrhoids were at their worst and likely thrombosed she would not benefit from an I&D at this time.  Explained that she should continue sitz baths for 15 to 20 minutes 2-3 times a day as well as applying Hydrocortisone cream which was refilled for her today twice daily for the next 2 weeks. 2.  She can use over-the-counter RectiCare cream with lidocaine as needed 3.  Discussed with patient that if she has increased severity of pain before time of her colonoscopy scheduled next month she can call and we will just refer her to surgery for further evaluation. 4.  Prescribed Pantoprazole 40 mg daily, 30-60 minutes before breakfast with 3 refills.  Discussed that we can increase this if it does not completely control her symptoms. 5.  Prescribed Zofran 4 mg q. 4-6 hours as needed for nausea #30 with 1 refill 6.  Patient to follow in clinic per recommendations from Dr. Loletha Carrow after time ofprocedure.  Explained that he will further evaluate her hemorrhoids at that time, internal and external and decide if she would be a good candidate for banding versus referral to surgery. 7.  Provided the patient with further Viberzi 75 mg samples 8.  Patient is already scheduled for EGD and colonoscopy on January 9.  Did discuss risk, benefits, limitations and alternatives and the patient agrees to proceed.  Ellouise Newer, PA-C [facility] 10/19/2018, 3:21 PM  Cc: [name], MD

## 2018-10-24 ENCOUNTER — Telehealth: Payer: Self-pay | Admitting: Physician Assistant

## 2018-10-24 NOTE — Telephone Encounter (Signed)
Kimberly [name] the pt states she has worsening pain and rectal bleeding.  Per last office note per Ellouise Newer; "Discussed with patient that if she has increased severity of pain before time of her colonoscopy scheduled next month she can call and we will just refer her to surgery for further evaluation."    Is it ok to refer her to [facility]?

## 2018-10-25 ENCOUNTER — Telehealth: Payer: Self-pay | Admitting: Gastroenterology

## 2018-10-25 NOTE — Telephone Encounter (Signed)
The patient has been notified of this information and all questions answered.   Referral has been made to [facility].

## 2018-10-25 NOTE — Telephone Encounter (Signed)
Yes, please refer her to [facility] for further eval. Thanks-JLL

## 2018-10-26 NOTE — Telephone Encounter (Signed)
Pt aware to come by the office and pick up her prep.

## 2018-10-29 NOTE — Telephone Encounter (Signed)
Referral was made on 12/19 and the pt has been advised that she can call and be put on their wait list.  Phone number was provided.

## 2018-10-29 NOTE — Telephone Encounter (Signed)
Pt is asking if her referral to surgeon can be expedited states she has been in a lot of pain this past weekend and really needs to be seen asap.  Best call back # [phone].

## 2018-11-05 ENCOUNTER — Telehealth: Payer: Self-pay | Admitting: Cardiology

## 2018-11-05 NOTE — Telephone Encounter (Signed)
Agree that she should follow with her PCP.  No clear cardiac etiology from her previous and recent work up.

## 2018-11-05 NOTE — Telephone Encounter (Signed)
Returned call to patient of Dr. [name]. She reports bilateral LE edema from knees to feet that has been going on since last Thursday. She does not report injury to legs. She denies intake of additional salt or sodium-rich foods. She reports no weight gain. She has some SOB off and on. She wore a heart monitor that she mailed back on 11/01/18 - reports not in EPIC. She had a normal echo in November. She sees her PCP tomorrow. Advised that she keep this appointment with PCP as they can assess her as well. Will route to MD for any recommendations

## 2018-11-05 NOTE — Telephone Encounter (Signed)
Pt c/o swelling: STAT is pt has developed SOB within 24 hours  1) How much weight have you gained and in what time span? No weight gain  2) If swelling, where is the swelling located? Knee on down to feet swelling  3) Are you currently taking a fluid pill? no  4) Are you currently SOB? Yes off and on  5) Do you have a log of your daily weights (if so, list)? no  6) Have you gained 3 pounds in a day or 5 pounds in a week? She does not think she have gaines any weight  7) Have you traveled recently? no

## 2018-11-06 DIAGNOSIS — Z1389 Encounter for screening for other disorder: Secondary | ICD-10-CM | POA: Diagnosis not present

## 2018-11-06 DIAGNOSIS — R748 Abnormal levels of other serum enzymes: Secondary | ICD-10-CM | POA: Diagnosis not present

## 2018-11-06 DIAGNOSIS — M7989 Other specified soft tissue disorders: Secondary | ICD-10-CM | POA: Diagnosis not present

## 2018-11-06 DIAGNOSIS — M79672 Pain in left foot: Secondary | ICD-10-CM | POA: Diagnosis not present

## 2018-11-06 DIAGNOSIS — R600 Localized edema: Secondary | ICD-10-CM | POA: Diagnosis not present

## 2018-11-07 DIAGNOSIS — S8980XA Other specified injuries of unspecified lower leg, initial encounter: Secondary | ICD-10-CM

## 2018-11-07 DIAGNOSIS — G610 Guillain-Barre syndrome: Secondary | ICD-10-CM

## 2018-11-07 HISTORY — DX: Other specified injuries of unspecified lower leg, initial encounter: S89.80XA

## 2018-11-07 HISTORY — PX: OTHER SURGICAL HISTORY: SHX169

## 2018-11-07 HISTORY — DX: Guillain-Barre syndrome: G61.0

## 2018-11-08 ENCOUNTER — Telehealth: Payer: Self-pay | Admitting: Gastroenterology

## 2018-11-08 NOTE — Telephone Encounter (Signed)
Pt had to resch proc to 11/29/2018 pt wants to know if she has hemorrhoid surgery on the 16th can she still have the procd on the 11/29/2018

## 2018-11-08 NOTE — Telephone Encounter (Signed)
Pt has not had surgery, she has an appt with [facility] 11/22/18 for initial consultation regarding her hemorrhoids. Her ECL is scheduled on 11/29/18. Explained to pt that she would not have surgery on 1/16 that is for an office visit. Pt reports her hemorrhoids are bad and wonders if she should reschedule after her surgical visit.

## 2018-11-08 NOTE — Telephone Encounter (Signed)
I need some more information, please. I do not have a note from any surgeon, so don't know what type of hemorrhoid surgery she means.

## 2018-11-09 NOTE — Telephone Encounter (Signed)
Spoke with pt and she is aware, appt cancelled. Pt states she will call back to reschedule the ECL.

## 2018-11-09 NOTE — Telephone Encounter (Signed)
Push the ECL back to mid February.  Recommend recticare ointment and instructions on use several times daily in case there is a fissure.

## 2018-11-10 ENCOUNTER — Other Ambulatory Visit: Payer: Self-pay | Admitting: Physician Assistant

## 2018-11-11 ENCOUNTER — Encounter (HOSPITAL_COMMUNITY): Payer: Self-pay | Admitting: Emergency Medicine

## 2018-11-11 ENCOUNTER — Emergency Department (HOSPITAL_COMMUNITY): Payer: BLUE CROSS/BLUE SHIELD

## 2018-11-11 ENCOUNTER — Other Ambulatory Visit: Payer: Self-pay

## 2018-11-11 ENCOUNTER — Emergency Department (HOSPITAL_COMMUNITY)
Admission: EM | Admit: 2018-11-11 | Discharge: 2018-11-11 | Disposition: A | Discharge status: Home / Self Care | Payer: BLUE CROSS/BLUE SHIELD | Attending: Emergency Medicine | Admitting: Emergency Medicine

## 2018-11-11 DIAGNOSIS — E860 Dehydration: Secondary | ICD-10-CM | POA: Diagnosis not present

## 2018-11-11 DIAGNOSIS — R112 Nausea with vomiting, unspecified: Secondary | ICD-10-CM | POA: Insufficient documentation

## 2018-11-11 DIAGNOSIS — Z79899 Other long term (current) drug therapy: Secondary | ICD-10-CM | POA: Diagnosis not present

## 2018-11-11 DIAGNOSIS — R059 Cough, unspecified: Secondary | ICD-10-CM

## 2018-11-11 DIAGNOSIS — R05 Cough: Secondary | ICD-10-CM | POA: Insufficient documentation

## 2018-11-11 DIAGNOSIS — Z87891 Personal history of nicotine dependence: Secondary | ICD-10-CM | POA: Diagnosis not present

## 2018-11-11 DIAGNOSIS — R197 Diarrhea, unspecified: Secondary | ICD-10-CM | POA: Diagnosis not present

## 2018-11-11 LAB — COMPREHENSIVE METABOLIC PANEL
ALT: 24 U/L (ref 0–44)
ANION GAP: 13 (ref 5–15)
AST: 57 U/L — ABNORMAL HIGH (ref 15–41)
Albumin: 3.5 g/dL (ref 3.5–5.0)
Alkaline Phosphatase: 61 U/L (ref 38–126)
BUN: 10 mg/dL (ref 6–20)
CO2: 28 mmol/L (ref 22–32)
Calcium: 9.3 mg/dL (ref 8.9–10.3)
Chloride: 97 mmol/L — ABNORMAL LOW (ref 98–111)
Creatinine, Ser: 0.86 mg/dL (ref 0.44–1.00)
GFR calc non Af Amer: >60 mL/min (ref >60)
Glucose, Bld: 119 mg/dL — ABNORMAL HIGH (ref 70–99)
Potassium: 3.4 mmol/L — ABNORMAL LOW (ref 3.5–5.1)
Sodium: 138 mmol/L (ref 135–145)
Total Bilirubin: 1.9 mg/dL — ABNORMAL HIGH (ref 0.3–1.2)
Total Protein: 6.6 g/dL (ref 6.5–8.1)

## 2018-11-11 LAB — CBC
HCT: 49.9 % — ABNORMAL HIGH (ref 36.0–46.0)
Hemoglobin: 16.2 g/dL — ABNORMAL HIGH (ref 12.0–15.0)
MCH: 34.6 pg — ABNORMAL HIGH (ref 26.0–34.0)
MCHC: 32.5 g/dL (ref 30.0–36.0)
MCV: 106.6 fL — ABNORMAL HIGH (ref 80.0–100.0)
PLATELETS: 331 10*3/uL (ref 150–400)
RBC: 4.68 MIL/uL (ref 3.87–5.11)
RDW: 15.6 % — ABNORMAL HIGH (ref 11.5–15.5)
WBC: 14.7 10*3/uL — ABNORMAL HIGH (ref 4.0–10.5)
nRBC: 0 % (ref 0.0–0.2)

## 2018-11-11 LAB — URINALYSIS, ROUTINE W REFLEX MICROSCOPIC
Glucose, UA: 50 mg/dL — AB
Hgb urine dipstick: NEGATIVE
KETONES UR: 5 mg/dL — AB
Nitrite: NEGATIVE
Protein, ur: 100 mg/dL — AB
Specific Gravity, Urine: 1.026 (ref 1.005–1.030)
Squamous Epithelial / HPF: >50 — ABNORMAL HIGH (ref 0–5)
WBC, UA: >50 WBC/hpf — ABNORMAL HIGH (ref 0–5)
pH: 6 (ref 5.0–8.0)

## 2018-11-11 LAB — LIPASE, BLOOD: Lipase: 41 U/L (ref 11–51)

## 2018-11-11 MED ORDER — ONDANSETRON 4 MG PO TBDP: 4.0000 mg | ORAL_TABLET | Freq: Three times a day (TID) | ORAL | 0 refills | Status: DC | PRN

## 2018-11-11 MED ORDER — SODIUM CHLORIDE 0.9 % IV BOLUS
1000.0000 mL | Freq: Once | INTRAVENOUS | Status: AC
  Administered 2018-11-11: 1000 mL via INTRAVENOUS

## 2018-11-11 MED ORDER — ONDANSETRON HCL 4 MG/2ML IJ SOLN
4.0000 mg | Freq: Once | INTRAMUSCULAR | Status: AC
  Administered 2018-11-11: 4 mg via INTRAVENOUS
  Filled 2018-11-11: qty 2

## 2018-11-11 NOTE — ED Provider Notes (Signed)
[facility] EMERGENCY DEPARTMENT Provider Note   CSN: [id number] Arrival date & time: 11/11/18  0350     History   Chief Complaint Chief Complaint  Patient presents with  . Emesis  . Nausea  . Diarrhea    HPI RONESHA HEENAN is a 33 y.o. female.  The history is provided by medical records. No language interpreter was used.  Emesis   This is a new problem. The current episode started more than 1 week ago. The problem occurs continuously. The problem has not changed since onset.The emesis has an appearance of stomach contents. There has been no fever (subjuective). Associated symptoms include chills, cough and diarrhea. Pertinent negatives include no abdominal pain, no fever, no headaches, no sweats and no URI.    Past Medical History:  Diagnosis Date  . Abnormal Pap smear   . ADD (attention deficit disorder)   . Anxiety   . Family history of anesthesia complication    mother has difficult  time going to sleep  . Headache(784.0)   . HSV-1 infection   . IBS (irritable bowel syndrome)   . Tachycardia     Patient Active Problem List   Diagnosis Date Noted  . Acute pharyngitis 10/01/2013  . Depo-Provera contraceptive status 05/17/2013  . Postpartum depression 10/09/2012  . Lactation suppression 10/09/2012  . Tachycardia 03/09/2012  . HSV-1 infection     Past Surgical History:  Procedure Laterality Date  . BREAST BIOPSY Right   . BREAST SURGERY     Biopsy-benign  . CESAREAN SECTION  09/13/2012   Procedure: CESAREAN SECTION;  Surgeon: Lavonia Drafts, MD;  Location: [facility];  Service: Obstetrics;  Laterality: N/A;  . KYPHOPLASTY N/A 08/27/2013   Procedure: Thoracic Twelve Kyphoplasty;  Surgeon: Erline Levine, MD;  Location: [facility];  Service: Neurosurgery;  Laterality: N/A;  T12 Kyphoplasty  . WISDOM TOOTH EXTRACTION     x4  . WRIST SURGERY Right 07-23-2013     OB History    Gravida  1   Para  1   Term  0   Preterm  1   AB  0    Living  2     SAB  0   TAB  0   Ectopic  0   Multiple  1   Live Births  2            Home Medications    Prior to Admission medications   Medication Sig Start Date End Date Taking? Authorizing Provider  Eluxadoline (VIBERZI) 75 MG TABS Take 1 tablet by mouth as needed.    [provider]  hydrocortisone (ANUSOL-HC) 2.5 % rectal cream Place 1 application rectally 2 (two) times daily. Use for 14 days 10/19/18   Levin Erp, PA  levonorgestrel [facility]) 20 MCG/24HR IUD 1 each by Intrauterine route once.    [provider]  loperamide (IMODIUM) 2 MG capsule Take by mouth as needed for diarrhea or loose stools.    [provider]  Multiple Vitamin (MULTIVITAMIN) tablet Take 1 tablet by mouth daily.    [provider]  Na Sulfate-K Sulfate-Mg Sulf 17.5-3.13-1.6 GM/177ML SOLN Suprep-Use as directed 10/19/18   Levin Erp, PA  Omega-3 Fatty Acids (FISH OIL) 1000 MG CAPS Take by mouth daily.    [provider]  ondansetron (ZOFRAN) 4 MG tablet Take every 4-6 hours as needed. 10/19/18   Levin Erp, PA  pantoprazole (PROTONIX) 40 MG tablet Take 1 tablet (40  mg total) by mouth daily. Take 30-60 minutes before breakfast. 10/19/18   Levin Erp, PA    Family History Family History  Problem Relation Age of Onset  . Depression Mother        chronic  . Hearing loss Mother   . Hyperlipidemia Mother   . Vision loss Mother        beachets disease  . COPD Father   . Diabetes Father   . Cancer Maternal Grandmother 64       breast cancer  . Parkinsonism Maternal Grandmother   . Heart disease Maternal Grandmother   . Breast cancer Maternal Grandmother   . Alzheimer's disease Paternal Grandmother   . Heart disease Paternal Grandfather        Died age 41  . Nephrolithiasis Paternal Grandfather   . Heart disease Maternal Grandfather   . Cancer Other 60       breast  . Breast cancer Other      Social History Social History   Tobacco Use  . Smoking status: Former Smoker    Packs/day: 1.00    Years: 10.00    Pack years: 10.00    Types: Cigarettes    Last attempt to quit: 01/31/2012    Years since quitting: 6.7  . Smokeless tobacco: Never Used  Substance Use Topics  . Alcohol use: No  . Drug use: No     Allergies   Patient has no known allergies.   Review of Systems Review of Systems  Constitutional: Positive for chills and fatigue. Negative for fever.  HENT: Positive for congestion.   Eyes: Negative for visual disturbance.  Respiratory: Positive for cough. Negative for chest tightness, shortness of breath and wheezing.   Cardiovascular: Negative for chest pain, palpitations and leg swelling (resolved).  Gastrointestinal: Positive for diarrhea, nausea and vomiting. Negative for abdominal pain, blood in stool and constipation.  Genitourinary: Positive for decreased urine volume. Negative for dysuria and flank pain.  Musculoskeletal: Negative for back pain, neck pain and neck stiffness.  Skin: Negative for rash and wound.  Neurological: Negative for seizures, weakness, light-headedness, numbness (tingling transiently) and headaches.  Psychiatric/Behavioral: Negative for agitation.  All other systems reviewed and are negative.    Physical Exam Updated Vital Signs BP 111/75 (BP Location: Left Arm)   Pulse (!) 105   Temp 98.6 F (37 C) (Oral)   Resp 16   Ht 5' 7.5" (1.715 m)   Wt 72.6 kg   SpO2 98%   BMI 24.69 kg/m   Physical Exam Vitals signs and nursing note reviewed.  Constitutional:      General: She is not in acute distress.    Appearance: She is well-developed. She is not ill-appearing, toxic-appearing or diaphoretic.  HENT:     Head: Normocephalic and atraumatic.     Nose: Nose normal. No congestion or rhinorrhea.     Mouth/Throat:     Mouth: Mucous membranes are dry.     Pharynx: No oropharyngeal exudate or posterior oropharyngeal  erythema.  Eyes:     Extraocular Movements: Extraocular movements intact.     Conjunctiva/sclera: Conjunctivae normal.     Pupils: Pupils are equal, round, and reactive to light.  Neck:     Musculoskeletal: Neck supple. No neck rigidity or muscular tenderness.  Cardiovascular:     Rate and Rhythm: Regular rhythm. Tachycardia present.     Pulses: Normal pulses.     Heart sounds: No murmur.  Pulmonary:     Effort: Pulmonary  effort is normal. No respiratory distress.     Breath sounds: Normal breath sounds. No wheezing, rhonchi or rales.  Chest:     Chest wall: No tenderness.  Abdominal:     General: There is no distension.     Palpations: Abdomen is soft.     Tenderness: There is no abdominal tenderness.  Musculoskeletal:        General: No tenderness, deformity or signs of injury.     Right lower leg: No edema.     Left lower leg: No edema.  Skin:    General: Skin is warm and dry.     Capillary Refill: Capillary refill takes less than 2 seconds.     Coloration: Skin is not pale.     Findings: No erythema.  Neurological:     General: No focal deficit present.     Mental Status: She is alert.     Sensory: No sensory deficit.     Motor: No weakness.  Psychiatric:        Mood and Affect: Mood normal.      ED Treatments / Results  Labs (all labs ordered are listed, but only abnormal results are displayed) Labs Reviewed  COMPREHENSIVE METABOLIC PANEL - Abnormal; Notable for the following components:      Result Value   Potassium 3.4 (*)    Chloride 97 (*)    Glucose, Bld 119 (*)    AST 57 (*)    Total Bilirubin 1.9 (*)    All other components within normal limits  CBC - Abnormal; Notable for the following components:   WBC 14.7 (*)    Hemoglobin 16.2 (*)    HCT 49.9 (*)    MCV 106.6 (*)    MCH 34.6 (*)    RDW 15.6 (*)    All other components within normal limits  URINALYSIS, ROUTINE W REFLEX MICROSCOPIC - Abnormal; Notable for the following components:   Color,  Urine AMBER (*)    APPearance CLOUDY (*)    Glucose, UA 50 (*)    Bilirubin Urine MODERATE (*)    Ketones, ur 5 (*)    Protein, ur 100 (*)    Leukocytes, UA SMALL (*)    WBC, UA >50 (*)    Bacteria, UA MANY (*)    Squamous Epithelial / LPF >50 (*)    All other components within normal limits  URINE CULTURE  LIPASE, BLOOD    EKG None  Radiology Dg Chest 2 View  Result Date: 11/11/2018 CLINICAL DATA:  Nausea vomiting and diarrhea for several weeks. Dehydration. Cough EXAM: CHEST - 2 VIEW COMPARISON:  None. FINDINGS: The heart size and mediastinal contours are within normal limits. Both lungs are clear. The visualized skeletal structures are unremarkable. IMPRESSION: No active cardiopulmonary disease. Electronically Signed   By: Kerby Moors M.D.   On: 11/11/2018 14:55    Procedures Procedures (including critical care time)  Medications Ordered in ED Medications  sodium chloride 0.9 % bolus 1,000 mL (1,000 mLs Intravenous New Bag/Given 11/11/18 1346)  sodium chloride 0.9 % bolus 1,000 mL (0 mLs Intravenous Stopped 11/11/18 1447)  ondansetron (ZOFRAN) injection 4 mg (4 mg Intravenous Given 11/11/18 1345)     Initial Impression / Assessment and Plan / ED Course  I have reviewed the triage vital signs and the nursing notes.  Pertinent labs & imaging results that were available during my care of the patient were reviewed by me and considered in my medical decision making (see  chart for details).     FARREN LANDA is a 33 y.o. female with a past medical history significant for IBS who presents with sensation of dehydration with nausea, vomiting, diarrhea, cough, fatigue, and malaise.  Patient reports that 2 weeks ago she had some bilateral leg edema and she was started on Lasix by her PCP.  She reports after starting Lasix she also felt nausea, vomiting, diarrhea.  Along with the fluid loss with Lasix, patient thinks that she is become dehydrated.  She reports she is feeling very  fatigued and is also had some cough.  She reports she is coughing up some phlegm and possible dark blood.  She reports also had numerous episodes of nausea and vomiting over the last week.  She thinks the blood may have come from throat irritation from vomiting.  She reports no constipation.  She reports her urine has been darker but has not been burning.  No dysuria.  She reports the urine has decreased in amount.  She is trying to eat and drink but is having difficulty with the nausea and vomiting persisting.  She presented today when she was feeling very fatigued and wanted to be evaluated.  Patient reports that in the setting of the dehydration, she was told by her PCP that she had increase in her kidney function and decrease in her potassium.  She has had some extremity tingling that is coming and going in relation to this.   On exam, patient is tachycardic on arrival.  Blood pressure is not hypotensive.  Patient is afebrile.  Patient resting comfortably.  Lungs are clear and chest is nontender.  Abdomen is nontender.  Oropharyngeal exam unremarkable.  No focal neurologic deficits aside from the transient tingling in extremities and abdomen.  No focal weakness seen.  Clinical aspect patient is dehydrated causing her symptoms.  She will have a chest x-ray given the productive cough.  No chest pain or shortness of breath at this time.  Patient reports has not tolerated nausea medicine orally, will give her IV fluids and IV nausea medicine.  Anticipate reassessment after rehydration and monitoring.  Patient will likely be p.o. challenged and if improves, she will be discharged with Zofran ODT with PCP follow-up.  3:47 PM Diagnostic laboratory work-up overall reassuring.  Evidence of hemoconcentration with elevated white count and hemoglobin.  Patient's metabolic panel shows normal kidney function and only mild hypokalemia 3.4.  Patient reports this is better than her last blood draw.  Lipase not elevated  and urinalysis shows no nitrites, given lack of dysuria, and report this does not feel like UTI, doubt infection.  Numerous epithelial cells likely of contamination.  Chest x-ray shows no pneumonia.  Patient was feeling better after Zofran and fluids.  After fluids are completed, patient will be discharged home with prescription for Zofran ODT and will follow-up with PCP.  Patient agreed with plan of care and had no other questions or concerns.  Patient discharged in good condition.   Final Clinical Impressions(s) / ED Diagnoses   Final diagnoses:  Dehydration  Nausea vomiting and diarrhea  Cough    ED Discharge Orders    None     Clinical Impression: 1. Dehydration   2. Nausea vomiting and diarrhea   3. Cough     Disposition: Discharge  Condition: Good  I have discussed the results, Dx and Tx plan with the pt(& family if present). He/she/they expressed understanding and agree(s) with the plan. Discharge instructions discussed at great  length. Strict return precautions discussed and pt &/or family have verbalized understanding of the instructions. No further questions at time of discharge.    New Prescriptions   ONDANSETRON (ZOFRAN ODT) 4 MG DISINTEGRATING TABLET    Take 1 tablet (4 mg total) by mouth every 8 (eight) hours as needed for nausea or vomiting.    Follow Up: [name], [address] [place] [address] [phone]     [facility] EMERGENCY DEPARTMENT [address] [id number] mc [place] [phone]        , Gwenyth Allegra, MD 11/11/18 [phone]

## 2018-11-11 NOTE — Discharge Instructions (Signed)
Your laboratory work-up today was overall reassuring.  There was evidence of dehydration, likely due to the combination of your previous Lasix use and the negative fluid balance of your nausea, vomiting, diarrhea.  As you can tolerate the fluids and began to improve, we feel you are safe for discharge home.  Please use the dissolvable Zofran prescription and follow-up with your primary doctor.  If any symptoms change or worsen, please return to the emergency department.

## 2018-11-11 NOTE — ED Notes (Signed)
Pt given ginger ale.

## 2018-11-11 NOTE — ED Notes (Signed)
ED Provider at bedside. 

## 2018-11-11 NOTE — ED Notes (Signed)
Patient transported to X-ray 

## 2018-11-11 NOTE — ED Notes (Signed)
Pt aware of need for urine sample.  

## 2018-11-11 NOTE — ED Triage Notes (Signed)
Pt. Stated, Donnald Garre had N/V/D  And some leg swelling for 2 weeks. Im dehydrated.

## 2018-11-13 LAB — URINE CULTURE: Culture: >=100000 — AB

## 2018-11-14 ENCOUNTER — Telehealth: Payer: Self-pay

## 2018-11-14 DIAGNOSIS — R112 Nausea with vomiting, unspecified: Secondary | ICD-10-CM | POA: Diagnosis not present

## 2018-11-14 DIAGNOSIS — N342 Other urethritis: Secondary | ICD-10-CM | POA: Diagnosis not present

## 2018-11-14 DIAGNOSIS — Z6823 Body mass index (BMI) 23.0-23.9, adult: Secondary | ICD-10-CM | POA: Diagnosis not present

## 2018-11-14 DIAGNOSIS — Z1389 Encounter for screening for other disorder: Secondary | ICD-10-CM | POA: Diagnosis not present

## 2018-11-14 DIAGNOSIS — R200 Anesthesia of skin: Secondary | ICD-10-CM | POA: Diagnosis not present

## 2018-11-14 DIAGNOSIS — R109 Unspecified abdominal pain: Secondary | ICD-10-CM | POA: Diagnosis not present

## 2018-11-14 DIAGNOSIS — E876 Hypokalemia: Secondary | ICD-10-CM | POA: Diagnosis not present

## 2018-11-14 NOTE — Telephone Encounter (Signed)
Post ED Visit - Positive Culture Follow-up  Culture report reviewed by antimicrobial stewardship pharmacist:  []  Elenor Quinones, Pharm.D. []  Heide Guile, Pharm.D., BCPS AQ-ID []  Parks Neptune, Pharm.D., BCPS []  Alycia Rossetti, Pharm.D., BCPS []  [place], Pharm.D., BCPS, AAHIVP []  Legrand Como, Pharm.D., BCPS, AAHIVP []  Salome Arnt, PharmD, BCPS []  Johnnette Gourd, PharmD, BCPS []  Hughes Better, PharmD, BCPS []  Leeroy Cha, PharmD C Charlena Cross Pharm D   Positive urine culture and no further patient follow-up is required at this time.  Genia Del 11/14/2018, 10:35 AM

## 2018-11-15 ENCOUNTER — Encounter: Payer: BLUE CROSS/BLUE SHIELD | Admitting: Gastroenterology

## 2018-11-15 ENCOUNTER — Encounter: Payer: Self-pay | Admitting: Neurology

## 2018-11-16 ENCOUNTER — Emergency Department (HOSPITAL_COMMUNITY): Payer: BLUE CROSS/BLUE SHIELD

## 2018-11-16 ENCOUNTER — Telehealth: Payer: Self-pay | Admitting: Neurology

## 2018-11-16 ENCOUNTER — Encounter: Payer: Self-pay | Admitting: Neurology

## 2018-11-16 ENCOUNTER — Inpatient Hospital Stay (HOSPITAL_COMMUNITY)
Admission: EM | Admit: 2018-11-16 | Discharge: 2018-11-21 | DRG: 095 | Disposition: A | Discharge status: Home / Self Care | Payer: BLUE CROSS/BLUE SHIELD | Attending: Internal Medicine | Admitting: Internal Medicine

## 2018-11-16 ENCOUNTER — Ambulatory Visit: Payer: BLUE CROSS/BLUE SHIELD | Admitting: Cardiology

## 2018-11-16 ENCOUNTER — Ambulatory Visit: Payer: BLUE CROSS/BLUE SHIELD | Admitting: Neurology

## 2018-11-16 ENCOUNTER — Encounter (HOSPITAL_COMMUNITY): Payer: Self-pay

## 2018-11-16 VITALS — BP 127/93 | HR 110 | Ht 67.5 in | Wt 152.0 lb

## 2018-11-16 DIAGNOSIS — R270 Ataxia, unspecified: Secondary | ICD-10-CM

## 2018-11-16 DIAGNOSIS — F419 Anxiety disorder, unspecified: Secondary | ICD-10-CM | POA: Diagnosis not present

## 2018-11-16 DIAGNOSIS — R112 Nausea with vomiting, unspecified: Secondary | ICD-10-CM

## 2018-11-16 DIAGNOSIS — R200 Anesthesia of skin: Secondary | ICD-10-CM | POA: Diagnosis not present

## 2018-11-16 DIAGNOSIS — F988 Other specified behavioral and emotional disorders with onset usually occurring in childhood and adolescence: Secondary | ICD-10-CM | POA: Diagnosis present

## 2018-11-16 DIAGNOSIS — Z79899 Other long term (current) drug therapy: Secondary | ICD-10-CM | POA: Diagnosis not present

## 2018-11-16 DIAGNOSIS — R292 Abnormal reflex: Secondary | ICD-10-CM | POA: Diagnosis not present

## 2018-11-16 DIAGNOSIS — Z791 Long term (current) use of non-steroidal anti-inflammatories (NSAID): Secondary | ICD-10-CM

## 2018-11-16 DIAGNOSIS — N390 Urinary tract infection, site not specified: Secondary | ICD-10-CM | POA: Diagnosis not present

## 2018-11-16 DIAGNOSIS — J90 Pleural effusion, not elsewhere classified: Secondary | ICD-10-CM | POA: Diagnosis not present

## 2018-11-16 DIAGNOSIS — G610 Guillain-Barre syndrome: Principal | ICD-10-CM | POA: Diagnosis present

## 2018-11-16 DIAGNOSIS — R29898 Other symptoms and signs involving the musculoskeletal system: Secondary | ICD-10-CM | POA: Diagnosis not present

## 2018-11-16 DIAGNOSIS — F17210 Nicotine dependence, cigarettes, uncomplicated: Secondary | ICD-10-CM | POA: Diagnosis not present

## 2018-11-16 DIAGNOSIS — R197 Diarrhea, unspecified: Secondary | ICD-10-CM | POA: Diagnosis not present

## 2018-11-16 DIAGNOSIS — M79609 Pain in unspecified limb: Secondary | ICD-10-CM

## 2018-11-16 DIAGNOSIS — K649 Unspecified hemorrhoids: Secondary | ICD-10-CM | POA: Diagnosis not present

## 2018-11-16 DIAGNOSIS — E876 Hypokalemia: Secondary | ICD-10-CM

## 2018-11-16 DIAGNOSIS — G629 Polyneuropathy, unspecified: Secondary | ICD-10-CM | POA: Diagnosis not present

## 2018-11-16 DIAGNOSIS — R202 Paresthesia of skin: Secondary | ICD-10-CM | POA: Diagnosis not present

## 2018-11-16 DIAGNOSIS — F909 Attention-deficit hyperactivity disorder, unspecified type: Secondary | ICD-10-CM | POA: Diagnosis present

## 2018-11-16 DIAGNOSIS — R531 Weakness: Secondary | ICD-10-CM | POA: Diagnosis not present

## 2018-11-16 DIAGNOSIS — R000 Tachycardia, unspecified: Secondary | ICD-10-CM | POA: Diagnosis not present

## 2018-11-16 DIAGNOSIS — E860 Dehydration: Secondary | ICD-10-CM

## 2018-11-16 DIAGNOSIS — E639 Nutritional deficiency, unspecified: Secondary | ICD-10-CM | POA: Diagnosis present

## 2018-11-16 DIAGNOSIS — F1011 Alcohol abuse, in remission: Secondary | ICD-10-CM | POA: Diagnosis not present

## 2018-11-16 DIAGNOSIS — R0789 Other chest pain: Secondary | ICD-10-CM | POA: Diagnosis not present

## 2018-11-16 DIAGNOSIS — J9811 Atelectasis: Secondary | ICD-10-CM | POA: Diagnosis not present

## 2018-11-16 HISTORY — DX: Hypokalemia: E87.6

## 2018-11-16 LAB — COMPREHENSIVE METABOLIC PANEL
ALT: 37 U/L (ref 0–44)
AST: 48 U/L — ABNORMAL HIGH (ref 15–41)
Albumin: 3.7 g/dL (ref 3.5–5.0)
Alkaline Phosphatase: 64 U/L (ref 38–126)
Anion gap: 13 (ref 5–15)
BUN: 6 mg/dL (ref 6–20)
CO2: 25 mmol/L (ref 22–32)
CREATININE: 0.58 mg/dL (ref 0.44–1.00)
Calcium: 9.5 mg/dL (ref 8.9–10.3)
Chloride: 99 mmol/L (ref 98–111)
GFR calc non Af Amer: >60 mL/min (ref >60)
Glucose, Bld: 130 mg/dL — ABNORMAL HIGH (ref 70–99)
Potassium: 2.4 mmol/L — CL (ref 3.5–5.1)
Sodium: 137 mmol/L (ref 135–145)
Total Bilirubin: 1.1 mg/dL (ref 0.3–1.2)
Total Protein: 6.7 g/dL (ref 6.5–8.1)

## 2018-11-16 LAB — CBC WITH DIFFERENTIAL/PLATELET
Abs Immature Granulocytes: 0.04 10*3/uL (ref 0.00–0.07)
BASOS PCT: 1 %
Basophils Absolute: 0.1 10*3/uL (ref 0.0–0.1)
Eosinophils Absolute: 0.1 10*3/uL (ref 0.0–0.5)
Eosinophils Relative: 1 %
HCT: 47.4 % — ABNORMAL HIGH (ref 36.0–46.0)
Hemoglobin: 15.7 g/dL — ABNORMAL HIGH (ref 12.0–15.0)
Immature Granulocytes: 1 %
Lymphocytes Relative: 19 %
Lymphs Abs: 1.6 10*3/uL (ref 0.7–4.0)
MCH: 34.4 pg — AB (ref 26.0–34.0)
MCHC: 33.1 g/dL (ref 30.0–36.0)
MCV: 103.9 fL — ABNORMAL HIGH (ref 80.0–100.0)
Monocytes Absolute: 0.6 10*3/uL (ref 0.1–1.0)
Monocytes Relative: 7 %
Neutro Abs: 6.2 10*3/uL (ref 1.7–7.7)
Neutrophils Relative %: 71 %
Platelets: 303 10*3/uL (ref 150–400)
RBC: 4.56 MIL/uL (ref 3.87–5.11)
RDW: 15.1 % (ref 11.5–15.5)
WBC: 8.5 10*3/uL (ref 4.0–10.5)
nRBC: 0 % (ref 0.0–0.2)

## 2018-11-16 LAB — TSH: TSH: 3.186 u[IU]/mL (ref 0.350–4.500)

## 2018-11-16 LAB — CSF CELL COUNT WITH DIFFERENTIAL
Eosinophils, CSF: 1 % (ref 0–1)
Lymphs, CSF: 15 % — ABNORMAL LOW (ref 40–80)
Monocyte-Macrophage-Spinal Fluid: 2 % — ABNORMAL LOW (ref 15–45)
RBC Count, CSF: 135000 /mm3 — ABNORMAL HIGH
RBC Count, CSF: 253 /mm3 — ABNORMAL HIGH
Segmented Neutrophils-CSF: 82 % — ABNORMAL HIGH (ref 0–6)
Tube #: 1
Tube #: 4
WBC CSF: 1 /mm3 (ref 0–5)
WBC, CSF: 51 /mm3 (ref 0–5)

## 2018-11-16 LAB — MAGNESIUM: MAGNESIUM: 1.9 mg/dL (ref 1.7–2.4)

## 2018-11-16 LAB — SEDIMENTATION RATE: Sed Rate: 1 mm/hr (ref 0–22)

## 2018-11-16 LAB — I-STAT CG4 LACTIC ACID, ED: Lactic Acid, Venous: 2.85 mmol/L (ref 0.5–1.9)

## 2018-11-16 LAB — HEMOGLOBIN A1C
Hgb A1c MFr Bld: 4.9 % (ref 4.8–5.6)
Mean Plasma Glucose: 93.93 mg/dL

## 2018-11-16 LAB — VITAMIN B12: Vitamin B-12: 802 pg/mL (ref 180–914)

## 2018-11-16 LAB — GLUCOSE, CSF: Glucose, CSF: 74 mg/dL — ABNORMAL HIGH (ref 40–70)

## 2018-11-16 LAB — POC URINE PREG, ED: Preg Test, Ur: NEGATIVE

## 2018-11-16 LAB — PROTEIN, CSF: Total  Protein, CSF: 16 mg/dL (ref 15–45)

## 2018-11-16 MED ORDER — ONDANSETRON HCL 4 MG/2ML IJ SOLN
4.0000 mg | Freq: Four times a day (QID) | INTRAMUSCULAR | Status: DC | PRN
  Administered 2018-11-17 – 2018-11-18 (×4): 4 mg via INTRAVENOUS
  Filled 2018-11-16 (×4): qty 2

## 2018-11-16 MED ORDER — POTASSIUM CHLORIDE IN NACL 40-0.9 MEQ/L-% IV SOLN
INTRAVENOUS | Status: DC
  Administered 2018-11-16 – 2018-11-17 (×2): 125 mL/h via INTRAVENOUS
  Filled 2018-11-16 (×2): qty 1000

## 2018-11-16 MED ORDER — LOPERAMIDE HCL 2 MG PO CAPS: 2.0000 mg | ORAL_CAPSULE | ORAL | Status: DC | PRN

## 2018-11-16 MED ORDER — IBUPROFEN 200 MG PO TABS
800.0000 mg | ORAL_TABLET | Freq: Four times a day (QID) | ORAL | Status: DC | PRN
  Administered 2018-11-16 – 2018-11-21 (×6): 800 mg via ORAL
  Filled 2018-11-16 (×6): qty 4

## 2018-11-16 MED ORDER — LORAZEPAM 2 MG/ML IJ SOLN
1.0000 mg | Freq: Two times a day (BID) | INTRAMUSCULAR | Status: DC | PRN
  Administered 2018-11-16: 1 mg via INTRAVENOUS
  Filled 2018-11-16: qty 1

## 2018-11-16 MED ORDER — MORPHINE SULFATE (PF) 2 MG/ML IV SOLN
2.0000 mg | INTRAVENOUS | Status: AC | PRN
  Administered 2018-11-17 – 2018-11-18 (×4): 2 mg via INTRAVENOUS
  Filled 2018-11-16 (×4): qty 1

## 2018-11-16 MED ORDER — LIDOCAINE HCL 2 % IJ SOLN
10.0000 mL | Freq: Once | INTRAMUSCULAR | Status: AC
  Administered 2018-11-16: 200 mg via INTRADERMAL
  Filled 2018-11-16: qty 20

## 2018-11-16 MED ORDER — LORAZEPAM 2 MG/ML IJ SOLN
1.0000 mg | Freq: Once | INTRAMUSCULAR | Status: AC
  Administered 2018-11-16: 1 mg via INTRAVENOUS
  Filled 2018-11-16: qty 1

## 2018-11-16 MED ORDER — FOLIC ACID 5 MG/ML IJ SOLN
1.0000 mg | Freq: Every day | INTRAMUSCULAR | Status: DC
  Filled 2018-11-16: qty 0.2

## 2018-11-16 MED ORDER — ONDANSETRON 4 MG PO TBDP
4.0000 mg | ORAL_TABLET | Freq: Three times a day (TID) | ORAL | Status: DC | PRN
  Administered 2018-11-16 (×2): 4 mg via ORAL
  Filled 2018-11-16 (×2): qty 1

## 2018-11-16 MED ORDER — PANTOPRAZOLE SODIUM 40 MG PO TBEC
40.0000 mg | DELAYED_RELEASE_TABLET | Freq: Every day | ORAL | Status: DC
  Administered 2018-11-17 – 2018-11-21 (×5): 40 mg via ORAL
  Filled 2018-11-16 (×5): qty 1

## 2018-11-16 MED ORDER — THIAMINE HCL 100 MG/ML IJ SOLN: 100.0000 mg | Freq: Two times a day (BID) | INTRAMUSCULAR | Status: DC

## 2018-11-16 MED ORDER — ACETAMINOPHEN 325 MG PO TABS
650.0000 mg | ORAL_TABLET | Freq: Four times a day (QID) | ORAL | Status: DC | PRN
  Administered 2018-11-18: 650 mg via ORAL
  Filled 2018-11-16: qty 2

## 2018-11-16 MED ORDER — PROMETHAZINE HCL 25 MG/ML IJ SOLN
12.5000 mg | Freq: Once | INTRAMUSCULAR | Status: AC
  Administered 2018-11-16: 12.5 mg via INTRAVENOUS
  Filled 2018-11-16: qty 1

## 2018-11-16 MED ORDER — SODIUM CHLORIDE 0.9 % IV BOLUS
1000.0000 mL | Freq: Once | INTRAVENOUS | Status: AC
  Administered 2018-11-16: 1000 mL via INTRAVENOUS

## 2018-11-16 MED ORDER — ACETAMINOPHEN 650 MG RE SUPP: 650.0000 mg | Freq: Four times a day (QID) | RECTAL | Status: DC | PRN

## 2018-11-16 MED ORDER — POTASSIUM CHLORIDE CRYS ER 20 MEQ PO TBCR
40.0000 meq | EXTENDED_RELEASE_TABLET | Freq: Once | ORAL | Status: AC
  Administered 2018-11-16: 40 meq via ORAL
  Filled 2018-11-16: qty 2

## 2018-11-16 MED ORDER — MORPHINE SULFATE (PF) 4 MG/ML IV SOLN
4.0000 mg | Freq: Once | INTRAVENOUS | Status: AC
  Administered 2018-11-16: 4 mg via INTRAVENOUS
  Filled 2018-11-16: qty 1

## 2018-11-16 MED ORDER — CYANOCOBALAMIN 1000 MCG/ML IJ SOLN
1000.0000 ug | Freq: Once | INTRAMUSCULAR | Status: AC
  Administered 2018-11-16: 1000 ug via INTRAMUSCULAR
  Filled 2018-11-16: qty 1

## 2018-11-16 MED ORDER — GADOBUTROL 1 MMOL/ML IV SOLN
6.0000 mL | Freq: Once | INTRAVENOUS | Status: AC | PRN
  Administered 2018-11-16: 6 mL via INTRAVENOUS

## 2018-11-16 MED ORDER — ENOXAPARIN SODIUM 40 MG/0.4ML ~~LOC~~ SOLN
40.0000 mg | SUBCUTANEOUS | Status: DC
  Administered 2018-11-16 – 2018-11-20 (×5): 40 mg via SUBCUTANEOUS
  Filled 2018-11-16 (×5): qty 0.4

## 2018-11-16 MED ORDER — ONDANSETRON HCL 4 MG/2ML IJ SOLN
4.0000 mg | Freq: Once | INTRAMUSCULAR | Status: AC
  Administered 2018-11-16: 4 mg via INTRAVENOUS
  Filled 2018-11-16: qty 2

## 2018-11-16 MED ORDER — POTASSIUM CHLORIDE 10 MEQ/100ML IV SOLN
10.0000 meq | Freq: Once | INTRAVENOUS | Status: AC
  Administered 2018-11-16: 10 meq via INTRAVENOUS
  Filled 2018-11-16: qty 100

## 2018-11-16 NOTE — Consult Note (Addendum)
NEUROLOGY CONSULT  Reason for Consult: neurologic opinion for possible GBS Referring Physician: [facility] office/ER  CC: "can't feel my feet"  HPI: Kimberly [name] is an 33 y.o. female with PMH of IBS, prev ETOH abuse (denies currently), most recently she was seen at her PCP on 12/31 for leg/ankle swelling. She was prescribed lasix, but this caused dehydration and hypokalemia for which she was treated for in the ER on 1/5. Potassium supplement and IVF given. UTI was also noted with E.Coli and she was prescribed Macrobid. She also describes having a viral GI illness several weeks ago in Dec.   Since that time she noted bilat feet numbness/tingling sensation. This has progressed in an ascending manor up her legs and now to torso (~T5 on exam). She is hyporeflexive in LEs. She also now has bilat hand numbness, extends up left arm around elbow, but not currently past wrist on right UE. UE reflexes are preserved. She does have LE weakness and associated ataxia with this. She was sent to the ER from [facility] office suspecting Gullian Barre syndrome.   Past Medical History Past Medical History:  Diagnosis Date  . Abnormal Pap smear   . ADD (attention deficit disorder)   . Anxiety   . Family history of anesthesia complication    mother has difficult  time going to sleep  . Headache(784.0)   . HSV-1 infection   . IBS (irritable bowel syndrome)   . Tachycardia     Past Surgical History Past Surgical History:  Procedure Laterality Date  . BREAST BIOPSY Right   . BREAST SURGERY     Biopsy-benign  . CESAREAN SECTION  09/13/2012   Procedure: CESAREAN SECTION;  Surgeon: Lavonia Drafts, MD;  Location: [facility];  Service: Obstetrics;  Laterality: N/A;  . KYPHOPLASTY N/A 08/27/2013   Procedure: Thoracic Twelve Kyphoplasty;  Surgeon: Erline Levine, MD;  Location: [facility];  Service: Neurosurgery;  Laterality: N/A;  T12 Kyphoplasty  . WISDOM TOOTH EXTRACTION     x4  . WRIST SURGERY Right 07-23-2013     Family History Family History  Problem Relation Age of Onset  . Depression Mother        chronic  . Hearing loss Mother   . Hyperlipidemia Mother   . Vision loss Mother        beachets disease  . COPD Father   . Diabetes Father   . Cancer Maternal Grandmother 35       breast cancer  . Parkinsonism Maternal Grandmother   . Heart disease Maternal Grandmother   . Breast cancer Maternal Grandmother   . Alzheimer's disease Paternal Grandmother   . Heart disease Paternal Grandfather        Died age 63  . Nephrolithiasis Paternal Grandfather   . Heart disease Maternal Grandfather   . Cancer Other 60       breast  . Breast cancer Other     Social History   Reports that she has been smoking cigarettes. She has a 5.00 pack-year smoking history. She has never used smokeless tobacco. She reports previous ETOH abuse; no current alcohol use x 44yr. She reports that she does not use drugs.  Allergies No Known Allergies  Home Medications (Not in a hospital admission)   Hospital Medications    ROS: History obtained from  General ROS: negative for - chills, fatigue, fever, night sweats, weight gain or weight loss Psychological ROS: negative for - behavioral disorder, hallucinations, memory difficulties, mood swings or suicidal ideation  Ophthalmic ROS: negative for - blurry vision, double vision, eye pain or loss of vision ENT ROS: negative for - epistaxis, nasal discharge, oral lesions, sore throat, tinnitus or vertigo Allergy and Immunology ROS: negative for - hives or itchy/watery eyes Hematological and Lymphatic ROS: negative for - bleeding problems, bruising or swollen lymph nodes Endocrine ROS: negative for - galactorrhea, hair pattern changes, polydipsia/polyuria or temperature intolerance Respiratory ROS: negative for - cough, hemoptysis, shortness of breath or wheezing Cardiovascular ROS: negative for - chest pain, dyspnea on exertion, edema or irregular  heartbeat Gastrointestinal ROS: +Diarrhea. negative for - abdominal pain, hematemesis, nausea/vomiting or stool incontinence Genito-Urinary ROS: negative for - dysuria, hematuria, incontinence or urinary frequency/urgency. Notes a recent UTI, but symptoms gone now Musculoskeletal ROS: negative for - joint swelling or muscular weakness Neurological ROS: as noted in HPI Dermatological ROS: negative for rash and skin lesion changes   Physical Examination:  Vitals:   11/16/18 1200 11/16/18 1230 11/16/18 1345 11/16/18 1415  BP: 129/81 117/81 120/70 119/80  Pulse: 96 95 99 (!) 103  Resp: (!) 21 17 (!) 23 19  Temp:      TempSrc:      SpO2: 100% 98% 99% 97%    General - min distress Heart - Regular rate and rhythm - no murmer Lungs - Clear to auscultation Abdomen - Soft - non tender Extremities - Distal pulses intact - no edema Skin - Warm and dry  Neurologic Examination:   Mental Status:  Alert, oriented, thought content appropriate. Speech without evidence of dysarthria or aphasia. Able to follow 3 step commands without difficulty.  Cranial Nerves:  II-bilateral visual fields intact III/IV/VI-Pupils were equal and reacted. Extraocular movements were full.  V/VII-no facial numbness and no facial weakness.  VIII-hearing normal.  X-normal speech and symmetrical palatal movement.  XII-midline tongue extension  Motor: Right : Upper extremity   5/5    Left:     Upper extremity   5/5  Lower extremity   4/5     Lower extremity   4/5 Tone and bulk:normal tone throughout; no atrophy noted Sensory: decreased in bilat LE up to torso ~T5 Deep Tendon Reflexes: areflexive in bilat LEs, UE are 2/4 Plantars: mute Cerebellar: Normal finger to nose. Dysmetria with heel to shin bilaterally, slightly worse with left side. Gait: not tested   LABORATORY STUDIES:  Basic Metabolic Panel: Recent Labs  Lab 11/11/18 1021 11/16/18 1025 11/16/18 1345  NA 138 137  --   K 3.4* 2.4*  --   CL 97*  99  --   CO2 28 25  --   GLUCOSE 119* 130*  --   BUN 10 6  --   CREATININE 0.86 0.58  --   CALCIUM 9.3 9.5  --   MG  --   --  1.9    Liver Function Tests: Recent Labs  Lab 11/11/18 1021 11/16/18 1025  AST 57* 48*  ALT 24 37  ALKPHOS 61 64  BILITOT 1.9* 1.1  PROT 6.6 6.7  ALBUMIN 3.5 3.7   Recent Labs  Lab 11/11/18 1021  LIPASE 41   No results for input(s): AMMONIA in the last 168 hours.  CBC: Recent Labs  Lab 11/11/18 1021 11/16/18 1025  WBC 14.7* 8.5  NEUTROABS  --  6.2  HGB 16.2* 15.7*  HCT 49.9* 47.4*  MCV 106.6* 103.9*  PLT 331 303    Cardiac Enzymes: No results for input(s): CKTOTAL, CKMB, CKMBINDEX, TROPONINI in the last 168 hours.  BNP:  Invalid input(s): POCBNP  CBG: No results for input(s): GLUCAP in the last 168 hours.  Microbiology:   Coagulation Studies: No results for input(s): LABPROT, INR in the last 72 hours.  Urinalysis:  Recent Labs  Lab 11/11/18 1010  COLORURINE AMBER*  LABSPEC 1.026  PHURINE 6.0  GLUCOSEU 50*  HGBUR NEGATIVE  BILIRUBINUR MODERATE*  KETONESUR 5*  PROTEINUR 100*  NITRITE NEGATIVE  LEUKOCYTESUR SMALL*    Lipid Panel:  No results found for: CHOL, TRIG, HDL, CHOLHDL, VLDL, LDLCALC  HgbA1C:  No results found for: HGBA1C  Urine Drug Screen:      Component Value Date/Time   LABOPIA NONE DETECTED 04/27/2009 2008   COCAINSCRNUR NONE DETECTED 04/27/2009 2008   LABBENZ POSITIVE (A) 04/27/2009 2008   AMPHETMU NONE DETECTED 04/27/2009 2008   THCU NONE DETECTED 04/27/2009 2008   LABBARB  04/27/2009 2008    NONE DETECTED        DRUG SCREEN FOR MEDICAL PURPOSES ONLY.  IF CONFIRMATION IS NEEDED FOR ANY PURPOSE, NOTIFY LAB WITHIN 5 DAYS.        LOWEST DETECTABLE LIMITS FOR URINE DRUG SCREEN Drug Class       Cutoff (ng/mL) Amphetamine      1000 Barbiturate      200 Benzodiazepine   629 Tricyclics       528 Opiates          300 Cocaine          300 THC              50     Alcohol Level:  No  results for input(s): ETH in the last 168 hours.  Miscellaneous labs:  EKG  EKG  IMAGING: Ct Head Wo Contrast  Result Date: 11/16/2018 CLINICAL DATA:  Left-sided numbness and tingling EXAM: CT HEAD WITHOUT CONTRAST TECHNIQUE: Contiguous axial images were obtained from the base of the skull through the vertex without intravenous contrast. COMPARISON:  September 04, 2007 FINDINGS: Brain: Ventricles are normal in size and configuration. There is no intracranial mass, hemorrhage, extra-axial fluid collection, or midline shift. Brain parenchyma appears unremarkable. No acute infarct evident. Vascular: There is no evident hyperdense vessel. There is minimal calcification in each carotid siphon region. Skull: The bony calvarium appears intact. Sinuses/Orbits: Visualized paranasal sinuses are clear. Visualized orbits appear symmetric bilaterally. Other: Mastoid air cells are clear. IMPRESSION: Minimal arterial vascular calcification. Study otherwise unremarkable. Electronically Signed   By: Lowella Grip III M.D.   On: 11/16/2018 11:46     Assessment/Plan: 33yrold woman with recent viral illness 3-4 weeks ago, UTI 1 week ago. Presents with a 5 day progressive ascending numbness and now weakness and a sensory ataxia that is slightly worse on left. GBS or variant of AIDP work up started.   # Ascending numbness, weakness and sensory ataxia-  AIDP strongly suspected  LP protein normal at 19. Will check B12, B1, TSH, ESR, CRP folate Will start b12 and b1 emperically  # Recovering ETOHism- will check neuropathy labs # Hypokalemia- replace  RECS: Replace K Add labs: Folate, B12, Cu,THS, A1C, ESR PT/OT evals Fall precautions  Desiree Metzger-Cihelka, ARNP-C, ANVP-BC Attending neurologist's note to follow   NEUROHOSPITALIST ADDENDUM Performed a face to face diagnostic evaluation.   I have reviewed the contents of history and physical exam as documented by PA/ARNP/Resident and agree with above  documentation.  I have discussed and formulated the above plan as documented. Edits to the note have been made as needed.   33year old  female with past medical history of alcohol abuse presents to the emergency room slightly greater than 1 week history of bilateral lower extreme numbness gradually extending upwards to normal upper extremities.  He also had   viral GI illness in December.  She also has weakness of both lower extremities as well as  ataxia on examination..  Areflexic in both lower extremities,decreased in upper extremities.  She does not have ophthalmoplegia.  LP was performed to look for albumin cytological dissociation-however not present with total protein 19.  Tap was traumatic with elevated RBCs.   MRI CT L-spine was negative, except for enhancing of the ventral right S1 nerve root.   Impression: ADD versus nutritional deficiency causing polyneuropathy  History and physical exam is very concerning for acute inflammatory demyelinating polyradiculoneuropathy Mardelle Matte).  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   She is a recovering alcoholic and is well could be that she has nutritional deficiencies.  Will check B12, B1, folate, TSH, ESR,CRP.  We will start cyanocobalamin.  We will start thiamine and folate acid after levels are drawn.  If B12 is normal, I will empirically treat her with IVIG as it takes a few days for B1 folic acid levels to come back.   Karena Addison Aroor MD [facility] [phone]   If 7pm to 7am, please call on call as listed on AMION.

## 2018-11-16 NOTE — ED Provider Notes (Signed)
[facility] Provider Note   CSN: [id number] Arrival date & time: 11/16/18  0946     History   Chief Complaint Chief Complaint  Patient presents with  . Numbness    HPI Kimberly [name] is a 33 y.o. female.  The history is provided by the patient and medical records. No language interpreter was used.     33 year old female with history of anxiety, tachycardia, recurrent headache, HSV-1 sent here from neurology office for evaluation of numbness.  Patient report 10 days ago she was having trouble with bilateral leg swelling.  She was seen by her PCP, was giving fluid pills Lasix which he took for 2 days, symptoms did improve however patient report feeling dehydrated along with nausea vomiting diarrhea and was seen in the ED 5 days ago for her symptoms.  She was diagnosed with dehydration, was given IV fluid and subsequently discharged home.  She then noticing progressive ascending numbness and tingling sensation which started in her feet and now has progressed towards her hands and arms.  She also report associated pain to her legs and back with the symptoms.  Rates pain a 7 out of 10.  Still endorse some nausea vomiting and diarrhea without any bowel bladder incontinence.  Her PCP schedule an appointment with neurologist today.  She saw neurologist but was sent here for further evaluation of her symptom which was concern for Guillain Barr syndrome or transverse myelitis.  Patient currently denies having any fever chills, no headache, she does complain of some mild sore throat from persistent vomiting.  She denies any chest pain or shortness of breath or abdominal pain.  She reports being diagnosed with acute UTI during the last ER visit but denies having any urinary symptoms.  Urine culture shows E. coli.  Patient is been treated with Macrobid.  Patient denies any recent travel.  She is a social drinker and smoker.  She denies any new onset of rash  outbreak.  Remote history of cold sores.  No recent flu symptoms.     Past Medical History:  Diagnosis Date  . Abnormal Pap smear   . ADD (attention deficit disorder)   . Anxiety   . Family history of anesthesia complication    mother has difficult  time going to sleep  . Headache(784.0)   . HSV-1 infection   . IBS (irritable bowel syndrome)   . Tachycardia     Patient Active Problem List   Diagnosis Date Noted  . Acute pharyngitis 10/01/2013  . Depo-Provera contraceptive status 05/17/2013  . Postpartum depression 10/09/2012  . Lactation suppression 10/09/2012  . Tachycardia 03/09/2012  . HSV-1 infection     Past Surgical History:  Procedure Laterality Date  . BREAST BIOPSY Right   . BREAST SURGERY     Biopsy-benign  . CESAREAN SECTION  09/13/2012   Procedure: CESAREAN SECTION;  Surgeon: Lavonia Drafts, MD;  Location: [facility];  Service: Obstetrics;  Laterality: N/A;  . KYPHOPLASTY N/A 08/27/2013   Procedure: Thoracic Twelve Kyphoplasty;  Surgeon: Erline Levine, MD;  Location: [facility];  Service: Neurosurgery;  Laterality: N/A;  T12 Kyphoplasty  . WISDOM TOOTH EXTRACTION     x4  . WRIST SURGERY Right 07-23-2013     OB History    Gravida  1   Para  1   Term  0   Preterm  1   AB  0   Living  2     SAB  0   TAB  0   Ectopic  0   Multiple  1   Live Births  2            Home Medications    Prior to Admission medications   Medication Sig Start Date End Date Taking? Authorizing Provider  Eluxadoline (VIBERZI) 75 MG TABS Take 1 tablet by mouth as needed.    [provider]  hydrocortisone (ANUSOL-HC) 2.5 % rectal cream Place 1 application rectally 2 (two) times daily. Use for 14 days 10/19/18   Levin Erp, PA  levonorgestrel [facility]) 20 MCG/24HR IUD 1 each by Intrauterine route once.    [provider]  loperamide (IMODIUM) 2 MG capsule Take by mouth as needed for diarrhea or loose stools.    [provider]  Multiple Vitamin (MULTIVITAMIN) tablet Take 1 tablet by mouth daily.    [provider]  Na Sulfate-K Sulfate-Mg Sulf 17.5-3.13-1.6 GM/177ML SOLN Suprep-Use as directed 10/19/18   Levin Erp, PA  nitrofurantoin, macrocrystal-monohydrate, (MACROBID) 100 MG capsule Take 100 mg by mouth 2 (two) times daily.    [provider]  Omega-3 Fatty Acids (FISH OIL) 1000 MG CAPS Take by mouth daily.    [provider]  ondansetron (ZOFRAN ODT) 4 MG disintegrating tablet Take 1 tablet (4 mg total) by mouth every 8 (eight) hours as needed for nausea or vomiting. 11/11/18   Tegeler, Gwenyth Allegra, MD  ondansetron (ZOFRAN) 4 MG tablet Take every 4-6 hours as needed. 10/19/18   Levin Erp, PA  pantoprazole (PROTONIX) 40 MG tablet TAKE 1 TABLET (40 MG TOTAL) BY MOUTH DAILY. TAKE 30-60 MINUTES BEFORE BREAKFAST. 11/12/18   Levin Erp, PA    Family History Family History  Problem Relation Age of Onset  . Depression Mother        chronic  . Hearing loss Mother   . Hyperlipidemia Mother   . Vision loss Mother        beachets disease  . COPD Father   . Diabetes Father   . Cancer Maternal Grandmother 103       breast cancer  . Parkinsonism Maternal Grandmother   . Heart disease Maternal Grandmother   . Breast cancer Maternal Grandmother   . Alzheimer's disease Paternal Grandmother   . Heart disease Paternal Grandfather        Died age 71  . Nephrolithiasis Paternal Grandfather   . Heart disease Maternal Grandfather   . Cancer Other 60       breast  . Breast cancer Other     Social History Social History   Tobacco Use  . Smoking status: Current Every Day Smoker    Packs/day: 0.50    Years: 10.00    Pack years: 5.00    Types: Cigarettes    Last attempt to quit: 01/31/2012    Years since quitting: 6.7  . Smokeless tobacco: Never Used  Substance Use Topics  . Alcohol use: Yes    Comment: occasionally  . Drug use: No      Allergies   Patient has no known allergies.   Review of Systems Review of Systems  All other systems reviewed and are negative.    Physical Exam Updated Vital Signs BP (!) 147/93 (BP Location: Right Arm)   Pulse (!) 115   Temp 98.6 F (37 C) (Oral)   Resp (!) 21   SpO2 100%   Physical Exam Vitals signs and nursing note reviewed.  Constitutional:  General: She is not in acute distress.    Appearance: She is well-developed.  HENT:     Head: Atraumatic.     Nose: Nose normal.     Mouth/Throat:     Mouth: Mucous membranes are moist.  Eyes:     Conjunctiva/sclera: Conjunctivae normal.  Neck:     Musculoskeletal: Neck supple.  Cardiovascular:     Rate and Rhythm: Normal rate and regular rhythm.  Pulmonary:     Effort: Pulmonary effort is normal.     Breath sounds: Normal breath sounds.  Abdominal:     Palpations: Abdomen is soft.     Tenderness: There is no abdominal tenderness.  Skin:    Findings: No rash.  Neurological:     Mental Status: She is alert.     Comments: Neurologic exam:  Speech clear, pupils equal round reactive to light, extraocular movements intact  Normal peripheral visual fields Cranial nerves III through XII normal including no facial droop Follows commands, 5/5 strength BLE, 4/5 strength BLE,  Sensation diminished to light touch to BLE and bilateral hands/forearm Coordination intact, no limb ataxia, finger-nose-finger normal No pronator drift Gait antalgic, requiring assistance       ED Treatments / Results  Labs (all labs ordered are listed, but only abnormal results are displayed) Labs Reviewed  CBC WITH DIFFERENTIAL/PLATELET - Abnormal; Notable for the following components:      Result Value   Hemoglobin 15.7 (*)    HCT 47.4 (*)    MCV 103.9 (*)    MCH 34.4 (*)    All other components within normal limits  COMPREHENSIVE METABOLIC PANEL - Abnormal; Notable for the following components:   Potassium 2.4 (*)     Glucose, Bld 130 (*)    AST 48 (*)    All other components within normal limits  CSF CELL COUNT WITH DIFFERENTIAL - Abnormal; Notable for the following components:   RBC Count, CSF 253 (*)    All other components within normal limits  CSF CELL COUNT WITH DIFFERENTIAL - Abnormal; Notable for the following components:   Color, CSF RED (*)    Appearance, CSF TURBID (*)    RBC Count, CSF 135,000 (*)    WBC, CSF 51 (*)    Segmented Neutrophils-CSF 82 (*)    Lymphs, CSF 15 (*)    Monocyte-Macrophage-Spinal Fluid 2 (*)    All other components within normal limits  GLUCOSE, CSF - Abnormal; Notable for the following components:   Glucose, CSF 74 (*)    All other components within normal limits  I-STAT CG4 LACTIC ACID, ED - Abnormal; Notable for the following components:   Lactic Acid, Venous 2.85 (*)    All other components within normal limits  CSF CULTURE  PROTEIN, CSF  MAGNESIUM  HIV ANTIBODY (ROUTINE TESTING W REFLEX)  POC URINE PREG, ED    EKG None   Date: 11/16/2018  Rate: 101  Rhythm: normal sinus tachycardia  QRS Axis: normal  Intervals: normal  ST/T Wave abnormalities: normal  Conduction Disutrbances: none  Narrative Interpretation: U-wave  Old EKG Reviewed: change noted    Radiology Ct Head Wo Contrast  Result Date: 11/16/2018 CLINICAL DATA:  Left-sided numbness and tingling EXAM: CT HEAD WITHOUT CONTRAST TECHNIQUE: Contiguous axial images were obtained from the base of the skull through the vertex without intravenous contrast. COMPARISON:  September 04, 2007 FINDINGS: Brain: Ventricles are normal in size and configuration. There is no intracranial mass, hemorrhage, extra-axial fluid collection, or midline shift.  Brain parenchyma appears unremarkable. No acute infarct evident. Vascular: There is no evident hyperdense vessel. There is minimal calcification in each carotid siphon region. Skull: The bony calvarium appears intact. Sinuses/Orbits: Visualized paranasal sinuses  are clear. Visualized orbits appear symmetric bilaterally. Other: Mastoid air cells are clear. IMPRESSION: Minimal arterial vascular calcification. Study otherwise unremarkable. Electronically Signed   By: Lowella Grip III M.D.   On: 11/16/2018 11:46    Procedures .Lumbar Puncture Date/Time: 11/16/2018 12:41 PM Performed by: Domenic Moras, PA-C Authorized by: Domenic Moras, PA-C   Consent:    Consent obtained:  Verbal and written   Consent given by:  Patient   Risks discussed:  Bleeding, infection, nerve damage, repeat procedure and headache   Alternatives discussed:  No treatment Pre-procedure details:    Procedure purpose:  Diagnostic   Preparation: Patient was prepped and draped in usual sterile fashion   Anesthesia (see MAR for exact dosages):    Anesthesia method:  Local infiltration   Local anesthetic:  Lidocaine 2% w/o epi Procedure details:    Lumbar space:  L3-L4 interspace   Patient position:  Sitting   Needle gauge:  24   Needle type:  Spinal needle - Quincke tip   Needle length (in):  2.5   Number of attempts:  1   Fluid appearance:  Blood-tinged then clearing   Tubes of fluid:  4   Total volume (ml):  14 Post-procedure:    Puncture site:  Adhesive bandage applied and direct pressure applied   Patient tolerance of procedure:  Tolerated well, no immediate complications  .Critical Care Performed by: Domenic Moras, PA-C Authorized by: Domenic Moras, PA-C   Critical care provider statement:    Critical care time (minutes):  45   Critical care was time spent personally by me on the following activities:  Discussions with consultants, evaluation of patient's response to treatment, examination of patient, ordering and performing treatments and interventions, ordering and review of laboratory studies, ordering and review of radiographic studies, pulse oximetry, re-evaluation of patient's condition, obtaining history from patient or surrogate and review of old charts    (including critical care time)  Medications Ordered in ED Medications  LORazepam (ATIVAN) injection 1 mg (has no administration in time range)  potassium chloride 10 mEq in 100 mL IVPB ( Intravenous Rate/Dose Change 11/16/18 1400)  sodium chloride 0.9 % bolus 1,000 mL (0 mLs Intravenous Stopped 11/16/18 1118)  lidocaine (XYLOCAINE) 2 % (with pres) injection 200 mg (200 mg Intradermal Given 11/16/18 1045)  LORazepam (ATIVAN) injection 1 mg (1 mg Intravenous Given 11/16/18 1116)  ondansetron (ZOFRAN) injection 4 mg (4 mg Intravenous Given 11/16/18 1112)  potassium chloride SA (K-DUR,KLOR-CON) CR tablet 40 mEq (40 mEq Oral Given 11/16/18 1358)  morphine 4 MG/ML injection 4 mg (4 mg Intravenous Given 11/16/18 1345)     Initial Impression / Assessment and Plan / ED Course  I have reviewed the triage vital signs and the nursing notes.  Pertinent labs & imaging results that were available during my care of the patient were reviewed by me and considered in my medical decision making (see chart for details).     BP 120/70   Pulse 99   Temp 98.6 F (37 C) (Oral)   Resp (!) 23   SpO2 99%    Final Clinical Impressions(s) / ED Diagnoses   Final diagnoses:  Paresthesia and pain of extremity  Dehydration  Hypokalemia    ED Discharge Orders    None  11:06 AM Pt here with ascending paresthesia x 5 days and having recurrent n/v/d x 10 days.  Was seen by neurology today and was concern for GBS or Transverse myelitis.  Pt sent here for further work up.  Plan to perform LP, MRI of spine, check labs, IVF given, and will admit for further care. Care discussed with Dr. Ashok Cordia.   12:40 PM Appreciate consultation from on call neurologist DR. Aroor who will see pt in the ER.  Successful LP performed by me.  Will obtain MRI of the spines for further evaluation.   2:56 PM Pt resting comfortably waiting for MRIs.  Will consult medicine for admission.  EKG shows U waves, pt is hypokalemic with K+ 2.4,  supplementation given. Elevated lactic acid of 2.85 not due to infection but likely dehydration. Head CT scan without acute changes.    3:06 PM Appreciate consultation from [facility] Dr. Sloan Leiter who agrees to see and admit pt for further care. MRI pending at this time.    Domenic Moras, PA-C 11/16/18 1507    Lajean Saver, MD 11/17/18 [phone]

## 2018-11-16 NOTE — ED Notes (Signed)
Patient transported to CT 

## 2018-11-16 NOTE — Telephone Encounter (Signed)
Called the emergency room and spoke with [place] the nurse in ED. Informed him that Dr Jaynee Eagles is sending a patient over that we are suspecting Mardelle Matte. Advised the neuro MD should be made aware to evaluate the patient.

## 2018-11-16 NOTE — Progress Notes (Signed)
Pt arrived unit safely, had no new complain. Pt is alert and oriented. Denies pain at this time.

## 2018-11-16 NOTE — Progress Notes (Addendum)
[facility]    Provider:  Dr Jaynee Eagles Referring Provider: [name], MD Primary Care Physician:  [name], MD  CC:  Ascending numbness and weakness and gait abnormality  HPI:  Kimberly [name] is a 33 y.o. female here as requested by Dr. Hilma Favors for numbness. PMHx depression. Swelling in the feet and legs for several months, placed on diuretic around the end of the year, she became dehydrated. The pain started when the swelling reduced. She was significantly swollen, she "didn't have ankles", then they went numb in the area of the swelling. But then it started moving up the legs 5 days ago and now is up to her chest and her left shoulder and arm is tingly but she is numb. Now her mouth is numb. She has "serious weakness", she can barely walk or hold onto herself a lot of times. When she is walking she feels like her legs are going to come out from beneath her. She has not fallen. Still progressing. She is tight when she lays down flat but she cant feel anything from the neck down.   Reviewed notes, labs and imaging from outside physicians, which showed:  Patient presented on 11/06/2018 with bilateral leg pain and swelling of the ankles and feet.  Reviewed Dr. Delanna Ahmadi notes.  She was put on Lasix and had hypokalemia.  She was prescribed potassium.  Went to ED and diagnosed with dehydration.  Urine culture positive for E. coli but patient denied treatment was told UA in hospital normal.  Labs also showed elevated MCV of 106.  Prior history of alcoholism treated 2 years ago and denies current use.  Believes she was tested for B12 and folate no lab results in chart TSH was normal in July 2019.  Still complaining of nausea and vomiting.TSH 0.78. B12, folate was drawn no results yet.     Review of Systems: Patient complains of symptoms per HPI as well as the following symptoms: numbness, weakness, restless legs. Pertinent negatives and positives per HPI. All others  negative.   Social History   Socioeconomic History  . Marital status: Married    Spouse name: Not on file  . Number of children: 2  . Years of education: Not on file  . Highest education level: Not on file  Occupational History  . Occupation: In home health   Social Needs  . Financial resource strain: Not on file  . Food insecurity:    Worry: Not on file    Inability: Not on file  . Transportation needs:    Medical: Not on file    Non-medical: Not on file  Tobacco Use  . Smoking status: Current Every Day Smoker    Packs/day: 0.50    Years: 10.00    Pack years: 5.00    Types: Cigarettes    Last attempt to quit: 01/31/2012    Years since quitting: 6.8  . Smokeless tobacco: Never Used  Substance and Sexual Activity  . Alcohol use: Yes    Comment: occasionally  . Drug use: No  . Sexual activity: Yes    Birth control/protection: I.U.D.    Comment: 1st intercourse 33 yo-More than 5 partners  Lifestyle  . Physical activity:    Days per week: Not on file    Minutes per session: Not on file  . Stress: Not on file  Relationships  . Social connections:    Talks on phone: Not on file    Gets together: Not on file  Attends religious service: Not on file    Active member of club or organization: Not on file    Attends meetings of clubs or organizations: Not on file    Relationship status: Not on file  . Intimate partner violence:    Fear of current or ex partner: Not on file    Emotionally abused: Not on file    Physically abused: Not on file    Forced sexual activity: Not on file  Other Topics Concern  . Not on file  Social History Narrative   Lives with husband and three step children.      Family History  Problem Relation Age of Onset  . Depression Mother        chronic  . Hearing loss Mother   . Hyperlipidemia Mother   . Vision loss Mother        beachets disease  . COPD Father   . Diabetes Father   . Cancer Maternal Grandmother 71       breast cancer    . Parkinsonism Maternal Grandmother   . Heart disease Maternal Grandmother   . Breast cancer Maternal Grandmother   . Alzheimer's disease Paternal Grandmother   . Heart disease Paternal Grandfather        Died age 47  . Nephrolithiasis Paternal Grandfather   . Heart disease Maternal Grandfather   . Cancer Other 60       breast  . Breast cancer Other     Past Medical History:  Diagnosis Date  . Abnormal Pap smear   . ADD (attention deficit disorder)   . Anxiety   . Family history of anesthesia complication    mother has difficult  time going to sleep  . Headache(784.0)   . HSV-1 infection   . IBS (irritable bowel syndrome)   . Tachycardia     Past Surgical History:  Procedure Laterality Date  . BREAST BIOPSY Right   . BREAST SURGERY     Biopsy-benign  . CESAREAN SECTION  09/13/2012   Procedure: CESAREAN SECTION;  Surgeon: Lavonia Drafts, MD;  Location: [facility];  Service: Obstetrics;  Laterality: N/A;  . KYPHOPLASTY N/A 08/27/2013   Procedure: Thoracic Twelve Kyphoplasty;  Surgeon: Erline Levine, MD;  Location: [facility];  Service: Neurosurgery;  Laterality: N/A;  T12 Kyphoplasty  . WISDOM TOOTH EXTRACTION     x4  . WRIST SURGERY Right 07-23-2013    No current facility-administered medications for this visit.    No current outpatient medications on file.   Facility-Administered Medications Ordered in Other Visits  Medication Dose Route Frequency Provider Last Rate Last Dose  . acetaminophen (TYLENOL) tablet 650 mg  650 mg Oral Q6H PRN Barb Merino, MD   650 mg at 11/18/18 1707   Or  . acetaminophen (TYLENOL) suppository 650 mg  650 mg Rectal Q6H PRN Barb Merino, MD      . enoxaparin (LOVENOX) injection 40 mg  40 mg Subcutaneous Q24H Barb Merino, MD   40 mg at 11/18/18 1802  . feeding supplement (ENSURE ENLIVE) (ENSURE ENLIVE) liquid 237 mL  237 mL Oral BID BM Mariel Aloe, MD   237 mL at 11/19/18 0915  . HYDROcodone-acetaminophen (NORCO/VICODIN)  5-325 MG per tablet 1 tablet  1 tablet Oral Q6H PRN Mariel Aloe, MD   1 tablet at 11/19/18 0915  . ibuprofen (ADVIL,MOTRIN) tablet 800 mg  800 mg Oral Q6H PRN Barb Merino, MD   800 mg at 11/18/18 1219  . Immune  Globulin 10% (PRIVIGEN) IV infusion 30 g  400 mg/kg Intravenous Q24 Hr x 5 Nettey, Ralph A, MD 20.6 mL/hr at 11/19/18 1018 30 g at 11/19/18 1018  . lidocaine (XYLOCAINE) 5 % ointment   Topical QID PRN Mariel Aloe, MD      . loperamide (IMODIUM) capsule 2 mg  2 mg Oral Q4H PRN Barb Merino, MD      . ondansetron (ZOFRAN) injection 4 mg  4 mg Intravenous Q6H PRN Barb Merino, MD   4 mg at 11/18/18 0818  . ondansetron (ZOFRAN-ODT) disintegrating tablet 4 mg  4 mg Oral Q8H PRN Barb Merino, MD   4 mg at 11/16/18 2012  . pantoprazole (PROTONIX) EC tablet 40 mg  40 mg Oral Daily Barb Merino, MD   40 mg at 11/19/18 0915    Allergies as of 11/16/2018  . (No Known Allergies)    Vitals: BP (!) 127/93   Pulse (!) 110   Ht 5' 7.5" (1.715 m)   Wt 152 lb (68.9 kg)   BMI 23.46 kg/m  Last Weight:  Wt Readings from Last 1 Encounters:  11/16/18 151 lb 14.4 oz (68.9 kg)   Last Height:   Ht Readings from Last 1 Encounters:  11/16/18 5\' 7"  (1.702 m)    Physical exam: Exam: Gen: NAD, conversant                     CV: RRR, no MRG. No Carotid Bruits. No peripheral edema, warm, nontender Eyes: Conjunctivae clear without exudates or hemorrhage  Neuro: Detailed Neurologic Exam  Speech:    Speech is normal; fluent and spontaneous with normal comprehension.  Cognition:    The patient is oriented to person, place, and time;     recent and remote memory intact;     language fluent;     normal attention, concentration,     fund of knowledge Cranial Nerves:    The pupils are equal, round, and reactive to light. The fundi are normal and spontaneous venous pulsations are present. Visual fields are full to finger confrontation. Extraocular movements are intact. Trigeminal  sensation is intact and the muscles of mastication are normal. The face is symmetric. The palate elevates in the midline. Hearing intact. Voice is normal. Shoulder shrug is normal. The tongue has normal motion without fasciculations.   Coordination:    Dysmetria left arm with FTN (improved with re-testing so unclear if valid exam finding).  Gait:    Ataxic  Motor Observation:    No asymmetry, no atrophy, and no involuntary movements noted. Tone:    Normal muscle tone.    Posture:    Posture is normal. normal erect    Strength:    Mild lower extremity weakness more proximally 4/5 hip flexion and 5- leg flexion and triceps otherwise appears intact strength and symmetric     Sensation: sensory level approx T5     Reflex Exam:  DTR's:    Deep tendon reflexes hyporeflexic in the and lower extremities and brisk at biceps bilaterally.   Toes:    The toes are downgoing bilaterally.   Clonus:    Clonus is absent.      Assessment/Plan:  Patient with ascending numbness, now weakness, states she cannot walk or feel her chest from the breasts down, sensory level around T4-T5, she is ataxic on exam, hyporeflexic in the lowers but brisk uppers she needs to go to the ED for spine MRI imaging and lumbar puncture for Guillain-Barre  or Transverse Myelitis. Some exam findings are variable such as left arm dysmetria and walking pattern, also hyporeflexia in the lowers may be due to prior alcoholism but patient needs thorough evaluation.   A total of 80 minutes was spent face-to-face with this patient. Over half this time was spent on counseling patient on the  1. Numbness   2. Ataxia   3. Weakness   4. AIDP (acute inflammatory demyelinating polyneuropathy) (HCC)     diagnosis and different diagnostic and therapeutic options, counseling and coordination of care, risks ans benefits of management, compliance, or risk factor reduction and education.    Called ED and neuro on call. Recommended she  not drive, patient is having someone come pick her up. I have recommended she not drive.   Cc: Dr. Dolphus Jenny, [facility] [address] [place],  [address]  Phone [phone] Fax [phone]

## 2018-11-16 NOTE — ED Triage Notes (Addendum)
Pt from home, c/o weakness and numbness from the chest down x 1 week; saw neurologist today, sent here to r/o guillian-barre; pt has not had flu shot this year; pt states she was seen here Sunday for dehydration; also c/o back and leg pain x 1 week, 7/10; pt states she was began taking lasix on 12/31 for leg swelling, stopped taking 12/2 due nausea and dehydration; pt states numbness started in feet and worked its way up; also c/o chest pressure when she lies flat

## 2018-11-16 NOTE — ED Notes (Signed)
Attempted report 

## 2018-11-16 NOTE — H&P (Signed)
History and Physical    Kimberly [name] NAT:[id number] DOB: [birth year] DOA: 11/16/2018  PCP: [name], MD  Patient coming from: home   I have personally briefly reviewed patient's old medical records in [facility] and Care everywhere as available.   Chief Complaint: Leg weakness and numbness.  HPI: Kimberly [name] is a 33 y.o. female with medical history significant of anxiety, ADHD, hypokalemia who presents to the hospital after referral from outpatient neurologist with ongoing weakness of the both extremities and ataxia.  According to the patient, she started having swelling of her feet and legs for the last few months, she was seen by primary care physician, she was prescribed diuretics that helped the swelling, however she developed hypokalemia and it was discontinued.  Patient started having weakness of the both lower legs since at least 1 week.  Patient felt numb on her ankles first then it is progressing up towards her chest.  Patient feels numb up to her chest now.  She also walks with very difficulty and unsteady.  She has 2 hold onto something to walk.  She was diagnosed with E. coli UTI on 11/11/2018 and currently on Macrobid for 5 days.  Patient did have symptoms of nausea, multiple episodes of loose watery diarrhea for about 1 week when she was diagnosed with UTI.  Patient was referred to neurology and was seen by Dr. Jaynee Eagles today who found patient with areflexia on lower extremities and sent to ER. At the time of my evaluation, patient had some nausea, she feels numb up to the front of the chest.  Denies any other symptoms.  She had some diarrhea in the past, now mostly improved.  Urine is normal.  Denies any fever or chills.  Denies any headache, dizziness, lightheadedness or syncopal episodes.  Denies any chest pain or shortness of breath.  Denies any wheezing.  No abdominal pain.  ED Course: Patient on arrival to ER is hemodynamically stable.  Her potassium is 2.4.  Underwent series of  MRI of C-spine, thoracic spine and lumbar spine.  Lumbar spine showed some facet changes otherwise there is no abnormal signal or other parenchymal abnormalities.  Patient was seen by neurology in the ER.  She will need admission because of significant symptoms.  Review of Systems: As per HPI otherwise 10 point review of systems negative.    Past Medical History:  Diagnosis Date  . Abnormal Pap smear   . ADD (attention deficit disorder)   . Anxiety   . Family history of anesthesia complication    mother has difficult  time going to sleep  . Headache(784.0)   . HSV-1 infection   . IBS (irritable bowel syndrome)   . Tachycardia     Past Surgical History:  Procedure Laterality Date  . BREAST BIOPSY Right   . BREAST SURGERY     Biopsy-benign  . CESAREAN SECTION  09/13/2012   Procedure: CESAREAN SECTION;  Surgeon: Lavonia Drafts, MD;  Location: [facility];  Service: Obstetrics;  Laterality: N/A;  . KYPHOPLASTY N/A 08/27/2013   Procedure: Thoracic Twelve Kyphoplasty;  Surgeon: Erline Levine, MD;  Location: [facility];  Service: Neurosurgery;  Laterality: N/A;  T12 Kyphoplasty  . WISDOM TOOTH EXTRACTION     x4  . WRIST SURGERY Right 07-23-2013     reports that she has been smoking cigarettes. She has a 5.00 pack-year smoking history. She has never used smokeless tobacco. She reports current alcohol use. She reports that she does not use drugs.  No Known Allergies  Family History  Problem Relation Age of Onset  . Depression Mother        chronic  . Hearing loss Mother   . Hyperlipidemia Mother   . Vision loss Mother        beachets disease  . COPD Father   . Diabetes Father   . Cancer Maternal Grandmother 73       breast cancer  . Parkinsonism Maternal Grandmother   . Heart disease Maternal Grandmother   . Breast cancer Maternal Grandmother   . Alzheimer's disease Paternal Grandmother   . Heart disease Paternal Grandfather        Died age 60  . Nephrolithiasis  Paternal Grandfather   . Heart disease Maternal Grandfather   . Cancer Other 60       breast  . Breast cancer Other      Prior to Admission medications   Medication Sig Start Date End Date Taking? Authorizing Provider  Eluxadoline (VIBERZI) 75 MG TABS Take 1 tablet by mouth as needed.   Yes [provider]  hydrocortisone (ANUSOL-HC) 2.5 % rectal cream Place 1 application rectally 2 (two) times daily. Use for 14 days Patient taking differently: Place 1 application rectally as needed for hemorrhoids or anal itching.  10/19/18  Yes Levin Erp, PA  ibuprofen (ADVIL,MOTRIN) 200 MG tablet Take 800 mg by mouth every 6 (six) hours as needed for mild pain or moderate pain.   Yes [provider]  levonorgestrel (MIRENA) 20 MCG/24HR IUD 1 each by Intrauterine route once.   Yes [provider]  loperamide (IMODIUM) 2 MG capsule Take by mouth as needed for diarrhea or loose stools.   Yes [provider]  Multiple Vitamin (MULTIVITAMIN) tablet Take 1 tablet by mouth daily.   Yes [provider]  nitrofurantoin, macrocrystal-monohydrate, (MACROBID) 100 MG capsule Take 100 mg by mouth 2 (two) times daily.   Yes [provider]  ondansetron (ZOFRAN ODT) 4 MG disintegrating tablet Take 1 tablet (4 mg total) by mouth every 8 (eight) hours as needed for nausea or vomiting. 11/11/18  Yes Tegeler, Gwenyth Allegra, MD  ondansetron (ZOFRAN) 4 MG tablet Take every 4-6 hours as needed. 10/19/18  Yes Levin Erp, PA  pantoprazole (PROTONIX) 40 MG tablet TAKE 1 TABLET (40 MG TOTAL) BY MOUTH DAILY. TAKE 30-60 MINUTES BEFORE BREAKFAST. Patient taking differently: Take 40 mg by mouth daily.  11/12/18  Yes Levin Erp, [place]    Physical Exam: Vitals:   11/16/18 1230 11/16/18 1345 11/16/18 1415 11/16/18 1747  BP: 117/81 120/70 119/80 (!) 131/91  Pulse: 95 99 (!) 103 (!) 103  Resp: 17 (!) 23 19 18   Temp:      TempSrc:      SpO2: 98% 99%  97% 100%    Constitutional: NAD, calm, comfortable Vitals:   11/16/18 1230 11/16/18 1345 11/16/18 1415 11/16/18 1747  BP: 117/81 120/70 119/80 (!) 131/91  Pulse: 95 99 (!) 103 (!) 103  Resp: 17 (!) 23 19 18   Temp:      TempSrc:      SpO2: 98% 99% 97% 100%   Eyes: PERRL, lids and conjunctivae normal ENMT: Mucous membranes are moist. Posterior pharynx clear of any exudate or lesions.Normal dentition.  Neck: normal, supple, no masses, no thyromegaly Respiratory: clear to auscultation bilaterally, no wheezing, no crackles. Normal respiratory effort. No accessory muscle use.  Cardiovascular: Regular rate and rhythm, no murmurs / rubs / gallops. No extremity edema.  2+ pedal pulses. No carotid bruits.  Abdomen: no tenderness, no masses palpated. No hepatosplenomegaly. Bowel sounds positive.  Musculoskeletal: no clubbing / cyanosis. No joint deformity upper and lower extremities. Good ROM, no contractures. Normal muscle tone.  Skin: no rashes, lesions, ulcers. No induration Psychiatric: Normal judgment and insight. Alert and oriented x 3. Normal mood.  Neurologic: CN 2-12 grossly intact.  Patient has slightly decreased motor power on both lower extremities, however it is difficult to examine with patient in the hallway in the emergency room.  Grossly normal. Decreased superficial sensation up to T5 level. Decreased deep tendon reflexes lower extremities. Upper extremity normal.    Labs on Admission: I have personally reviewed following labs and imaging studies  CBC: Recent Labs  Lab 11/11/18 1021 11/16/18 1025  WBC 14.7* 8.5  NEUTROABS  --  6.2  HGB 16.2* 15.7*  HCT 49.9* 47.4*  MCV 106.6* 103.9*  PLT 331 433   Basic Metabolic Panel: Recent Labs  Lab 11/11/18 1021 11/16/18 1025 11/16/18 1345  NA 138 137  --   K 3.4* 2.4*  --   CL 97* 99  --   CO2 28 25  --   GLUCOSE 119* 130*  --   BUN 10 6  --   CREATININE 0.86 0.58  --   CALCIUM 9.3 9.5  --   MG  --   --  1.9    GFR: Estimated Creatinine Clearance: 98.2 mL/min (by C-G formula based on SCr of 0.58 mg/dL). Liver Function Tests: Recent Labs  Lab 11/11/18 1021 11/16/18 1025  AST 57* 48*  ALT 24 37  ALKPHOS 61 64  BILITOT 1.9* 1.1  PROT 6.6 6.7  ALBUMIN 3.5 3.7   Recent Labs  Lab 11/11/18 1021  LIPASE 41   No results for input(s): AMMONIA in the last 168 hours. Coagulation Profile: No results for input(s): INR, PROTIME in the last 168 hours. Cardiac Enzymes: No results for input(s): CKTOTAL, CKMB, CKMBINDEX, TROPONINI in the last 168 hours. BNP (last 3 results) No results for input(s): PROBNP in the last 8760 hours. HbA1C: No results for input(s): HGBA1C in the last 72 hours. CBG: No results for input(s): GLUCAP in the last 168 hours. Lipid Profile: No results for input(s): CHOL, HDL, LDLCALC, TRIG, CHOLHDL, LDLDIRECT in the last 72 hours. Thyroid Function Tests: No results for input(s): TSH, T4TOTAL, FREET4, T3FREE, THYROIDAB in the last 72 hours. Anemia Panel: No results for input(s): VITAMINB12, FOLATE, FERRITIN, TIBC, IRON, RETICCTPCT in the last 72 hours. Urine analysis:    Component Value Date/Time   COLORURINE AMBER (A) 11/11/2018 1010   APPEARANCEUR CLOUDY (A) 11/11/2018 1010   LABSPEC 1.026 11/11/2018 1010   PHURINE 6.0 11/11/2018 1010   GLUCOSEU 50 (A) 11/11/2018 1010   HGBUR NEGATIVE 11/11/2018 1010   BILIRUBINUR MODERATE (A) 11/11/2018 1010   KETONESUR 5 (A) 11/11/2018 1010   PROTEINUR 100 (A) 11/11/2018 1010   UROBILINOGEN 0.2 06/03/2014 1311   NITRITE NEGATIVE 11/11/2018 1010   LEUKOCYTESUR SMALL (A) 11/11/2018 1010    Radiological Exams on Admission: Ct Head Wo Contrast  Result Date: 11/16/2018 CLINICAL DATA:  Left-sided numbness and tingling EXAM: CT HEAD WITHOUT CONTRAST TECHNIQUE: Contiguous axial images were obtained from the base of the skull through the vertex without intravenous contrast. COMPARISON:  September 04, 2007 FINDINGS: Brain: Ventricles  are normal in size and configuration. There is no intracranial mass, hemorrhage, extra-axial fluid collection, or midline shift. Brain parenchyma appears unremarkable. No acute infarct evident. Vascular: There  is no evident hyperdense vessel. There is minimal calcification in each carotid siphon region. Skull: The bony calvarium appears intact. Sinuses/Orbits: Visualized paranasal sinuses are clear. Visualized orbits appear symmetric bilaterally. Other: Mastoid air cells are clear. IMPRESSION: Minimal arterial vascular calcification. Study otherwise unremarkable. Electronically Signed   By: Lowella Grip III M.D.   On: 11/16/2018 11:46   Mr Cervical Spine W Or Wo Contrast  Result Date: 11/16/2018 CLINICAL DATA:  Ascending lower extremity weakness, numbness, and tingling, now to the level of T5 on neurological exam. Hypo over flecks of lower extremities. Recent viral gastrointestinal illness a few weeks ago. EXAM: MRI CERVICAL, THORACIC AND LUMBAR SPINE WITH AND WITHOUT CONTRAST TECHNIQUE: Multiplanar and multiecho pulse sequences of the cervical spine, to include the craniocervical junction and cervicothoracic junction, and thoracic and lumbar spine, were obtained without intravenous contrast. COMPARISON:  CT thoracic spine dated July 21, 2013. CT abdomen pelvis dated July 20, 2004. FINDINGS: MRI CERVICAL SPINE FINDINGS Alignment: Physiologic. Vertebrae: No acute fracture, evidence of discitis, or bone lesion. Old mild T1 superior endplate compression deformity. Cord: Normal signal and morphology.  No intradural enhancement. Posterior Fossa, vertebral arteries, paraspinal tissues: Negative. Disc levels: C2-C3:  Negative. C3-C4:  Negative. C4-C5:  Minimal disc bulge.  No stenosis. C5-C6:  Tiny central disc protrusion.  No stenosis. C6-C7:  Negative. C7-T1:  Negative. MRI THORACIC SPINE FINDINGS Alignment:  Slight focal kyphosis at T12. Vertebrae: No acute fracture, evidence of discitis, or bone  lesion. Chronic mild T12 compression deformity status post cement augmentation 2 mm retropulsion of the posterior superior endplate. Chronic mild superior endplate compression deformities of T1 and T3. Cord:  Normal signal and morphology.  No intradural enhancement. Paraspinal and other soft tissues: Subsegmental atelectasis in both lower lobes. Disc levels: Mild disc degeneration at T6-T7 without significant disc bulge or herniation. Small diffuse disc bulge at T11-T12. No spinal canal or neuroforaminal stenosis at any level. MRI LUMBAR SPINE FINDINGS Segmentation: Assumed standard. The last well-formed disc space is designated L5-S1 for the purposes of this report. Alignment:  Physiologic. Vertebrae: No acute fracture, evidence of discitis, or suspicious bone lesion. L3 hemangioma. Conus medullaris and cauda equina: Conus extends to the L1-L2 level. Conus and cauda equina appear normal. Mild enhancement of the ventral right S1 nerve root. No other abnormal intradural enhancement. Paraspinal and other soft tissues: Negative. Disc levels: T12-L1 to L4-L5:  Negative. L5-S1: Small central disc protrusion with annular fissure. No stenosis. IMPRESSION: Cervical spine: 1. No acute abnormality. 2. Minimal degenerative disc disease at C4-C5 and C5-C6. Thoracic spine: 1. No acute abnormality. 2. Chronic T1, T3, and T12 compression deformities. 3. Mild degenerative disc disease at T6-T7 and T11-T12. Lumbar spine: 1. Mild enhancement of the ventral right S1 nerve root, consistent with radiculitis. No additional abnormal intradural enhancement. 2. Mild degenerative disc disease at L5-S1. Electronically Signed   By: Titus Dubin M.D.   On: 11/16/2018 17:24   Mr Thoracic Spine W Wo Contrast  Result Date: 11/16/2018 CLINICAL DATA:  Ascending lower extremity weakness, numbness, and tingling, now to the level of T5 on neurological exam. Hypo over flecks of lower extremities. Recent viral gastrointestinal illness a few weeks  ago. EXAM: MRI CERVICAL, THORACIC AND LUMBAR SPINE WITH AND WITHOUT CONTRAST TECHNIQUE: Multiplanar and multiecho pulse sequences of the cervical spine, to include the craniocervical junction and cervicothoracic junction, and thoracic and lumbar spine, were obtained without intravenous contrast. COMPARISON:  CT thoracic spine dated July 21, 2013. CT abdomen pelvis dated July 20, 2004. FINDINGS: MRI CERVICAL SPINE FINDINGS Alignment: Physiologic. Vertebrae: No acute fracture, evidence of discitis, or bone lesion. Old mild T1 superior endplate compression deformity. Cord: Normal signal and morphology.  No intradural enhancement. Posterior Fossa, vertebral arteries, paraspinal tissues: Negative. Disc levels: C2-C3:  Negative. C3-C4:  Negative. C4-C5:  Minimal disc bulge.  No stenosis. C5-C6:  Tiny central disc protrusion.  No stenosis. C6-C7:  Negative. C7-T1:  Negative. MRI THORACIC SPINE FINDINGS Alignment:  Slight focal kyphosis at T12. Vertebrae: No acute fracture, evidence of discitis, or bone lesion. Chronic mild T12 compression deformity status post cement augmentation 2 mm retropulsion of the posterior superior endplate. Chronic mild superior endplate compression deformities of T1 and T3. Cord:  Normal signal and morphology.  No intradural enhancement. Paraspinal and other soft tissues: Subsegmental atelectasis in both lower lobes. Disc levels: Mild disc degeneration at T6-T7 without significant disc bulge or herniation. Small diffuse disc bulge at T11-T12. No spinal canal or neuroforaminal stenosis at any level. MRI LUMBAR SPINE FINDINGS Segmentation: Assumed standard. The last well-formed disc space is designated L5-S1 for the purposes of this report. Alignment:  Physiologic. Vertebrae: No acute fracture, evidence of discitis, or suspicious bone lesion. L3 hemangioma. Conus medullaris and cauda equina: Conus extends to the L1-L2 level. Conus and cauda equina appear normal. Mild enhancement of the  ventral right S1 nerve root. No other abnormal intradural enhancement. Paraspinal and other soft tissues: Negative. Disc levels: T12-L1 to L4-L5:  Negative. L5-S1: Small central disc protrusion with annular fissure. No stenosis. IMPRESSION: Cervical spine: 1. No acute abnormality. 2. Minimal degenerative disc disease at C4-C5 and C5-C6. Thoracic spine: 1. No acute abnormality. 2. Chronic T1, T3, and T12 compression deformities. 3. Mild degenerative disc disease at T6-T7 and T11-T12. Lumbar spine: 1. Mild enhancement of the ventral right S1 nerve root, consistent with radiculitis. No additional abnormal intradural enhancement. 2. Mild degenerative disc disease at L5-S1. Electronically Signed   By: Titus Dubin M.D.   On: 11/16/2018 17:24   Mr Lumbar Spine W Wo Contrast  Result Date: 11/16/2018 CLINICAL DATA:  Ascending lower extremity weakness, numbness, and tingling, now to the level of T5 on neurological exam. Hypo over flecks of lower extremities. Recent viral gastrointestinal illness a few weeks ago. EXAM: MRI CERVICAL, THORACIC AND LUMBAR SPINE WITH AND WITHOUT CONTRAST TECHNIQUE: Multiplanar and multiecho pulse sequences of the cervical spine, to include the craniocervical junction and cervicothoracic junction, and thoracic and lumbar spine, were obtained without intravenous contrast. COMPARISON:  CT thoracic spine dated July 21, 2013. CT abdomen pelvis dated July 20, 2004. FINDINGS: MRI CERVICAL SPINE FINDINGS Alignment: Physiologic. Vertebrae: No acute fracture, evidence of discitis, or bone lesion. Old mild T1 superior endplate compression deformity. Cord: Normal signal and morphology.  No intradural enhancement. Posterior Fossa, vertebral arteries, paraspinal tissues: Negative. Disc levels: C2-C3:  Negative. C3-C4:  Negative. C4-C5:  Minimal disc bulge.  No stenosis. C5-C6:  Tiny central disc protrusion.  No stenosis. C6-C7:  Negative. C7-T1:  Negative. MRI THORACIC SPINE FINDINGS  Alignment:  Slight focal kyphosis at T12. Vertebrae: No acute fracture, evidence of discitis, or bone lesion. Chronic mild T12 compression deformity status post cement augmentation 2 mm retropulsion of the posterior superior endplate. Chronic mild superior endplate compression deformities of T1 and T3. Cord:  Normal signal and morphology.  No intradural enhancement. Paraspinal and other soft tissues: Subsegmental atelectasis in both lower lobes. Disc levels: Mild disc degeneration at T6-T7 without significant disc bulge or herniation. Small diffuse disc bulge  at T11-T12. No spinal canal or neuroforaminal stenosis at any level. MRI LUMBAR SPINE FINDINGS Segmentation: Assumed standard. The last well-formed disc space is designated L5-S1 for the purposes of this report. Alignment:  Physiologic. Vertebrae: No acute fracture, evidence of discitis, or suspicious bone lesion. L3 hemangioma. Conus medullaris and cauda equina: Conus extends to the L1-L2 level. Conus and cauda equina appear normal. Mild enhancement of the ventral right S1 nerve root. No other abnormal intradural enhancement. Paraspinal and other soft tissues: Negative. Disc levels: T12-L1 to L4-L5:  Negative. L5-S1: Small central disc protrusion with annular fissure. No stenosis. IMPRESSION: Cervical spine: 1. No acute abnormality. 2. Minimal degenerative disc disease at C4-C5 and C5-C6. Thoracic spine: 1. No acute abnormality. 2. Chronic T1, T3, and T12 compression deformities. 3. Mild degenerative disc disease at T6-T7 and T11-T12. Lumbar spine: 1. Mild enhancement of the ventral right S1 nerve root, consistent with radiculitis. No additional abnormal intradural enhancement. 2. Mild degenerative disc disease at L5-S1. Electronically Signed   By: Titus Dubin M.D.   On: 11/16/2018 17:24    EKG: Independently reviewed.  Shows low voltage normal sinus rhythm.  Assessment/Plan Principal Problem:   Weakness of both lower extremities Active Problems:    Hypokalemia   Anxiety   Weakness of both lower extremities/ascending areflexia: Suspecting AIDP as per neuro evaluation.  Admit to medical bed. Neuro checks Q4h Peak flow q8h. Patient received a spinal tap in the ER, lab investigations are pending. MRI of the spines were essentially normal with no localized pathology causing sensory ataxia. Patient receiving multiple laboratory testing including A1c, TSH, folate, B12, copper level, GI pathogen panel.  Results are pending. Patient may benefit with IVIG, will defer to neurology for decision. Work with PT OT.  Hypokalemia: Chronic and persistent.  Magnesium is normal.  Less likely causing acute neurological symptoms.  Will replace aggressively.  She received 50 mEq of potassium in the ER.  Will further replace on IV fluids and recheck level in the morning.  Will check magnesium and phosphorus again.  Acute UTI: Uncomplicated UTI.  Completely treated for more than 3 days.  Will stop nitrofurantoin.   DVT prophylaxis: Lovenox. Code Status: Full code. Family Communication: Patient able to make medical decisions. Disposition Plan: Home versus rehab. Consults called: Neurology. Admission status: Inpatient general medical.   Barb Merino MD [facility] Pager [phone]  If 7PM-7AM, please contact night-coverage [email or web address] Password [facility]  11/16/2018, 5:55 PM

## 2018-11-16 NOTE — Patient Instructions (Signed)
Guillain-Barr Syndrome Guillain-Barr syndrome (GBS) is a rare disorder in which the body's disease-fighting system (immune system) attacks the nerves. This causes numbness and tingling. It can eventually develop into a serious condition and, in some cases, cause paralysis. GBS does not spread from person to person (is not contagious). Most people with GBS recover within a few months. However, some people may have muscle weakness that lasts for a few years. What are the causes? The exact cause of GBS is not known. In most cases, GBS develops a few days or weeks after you have an infection, such as:  A stomach infection caused by Campylobacter bacteria. This type of infection usually causes diarrhea.  An infection of the nose, throat, and upper airways (respiratory infection), such as a cold or the flu.  A viral infection. Less commonly, GBS may be triggered by:  Surgery.  A vaccine. What are the signs or symptoms? The most common first symptoms are tingling, numbness, and weakness in the lower legs. Other symptoms may develop over hours, days, or weeks. These may include:  Muscle weakness that spreads from the legs to the abdomen and arms.  Aching or burning pain.  Weakness of muscles in the face.  Trouble chewing or swallowing.  Slurred speech.  Inability to move the arms, legs, or both (paralysis).  Difficulty breathing. How is this diagnosed? This condition may be diagnosed based on:  Your symptoms and medical history. Your health care provider will ask about any recent infections you have had.  A physical exam.  A test that measures how quickly your nerves carry signals to your muscles (nerve conduction velocity test).  Testing a sample of fluid that surrounds the spinal cord (lumbar puncture). How is this treated? If your symptoms are mild, you may be given medicines to control your symptoms. If your condition becomes severe, you may need to be treated at a hospital.  At the hospital, treatment may include:  Medicines. These help to improve numbness or tingling sensations in your arms and legs (paresthesias) caused by irritation of the nerves.  Plasma exchange (plasmapheresis). This is a procedure in which the immune system cells that are attacking your nerves are removed from your blood.  Getting proteins (immunoglobulins or antibodies) that slow down the immune system's attack on your nerves. These may be given through an IV inserted into one of your veins.  Oxygen and breathing assistance. Treatment may also include physical therapy to help strengthen your muscles. After being treated in the hospital, you may be transferred to a rehabilitation center to get intensive physical therapy. Once your strength improves, you may continue to get physical therapy at home. Additional physical therapy may be needed for many months. Follow these instructions at home:   If physical therapy was prescribed, do exercises as told by your health care provider.  Make sure you have the support you need. Someone may need to help you bathe, dress, and prepare meals until you regain your strength.  Rest as needed. Return to your normal activities as told by your health care provider. Ask your health care provider what activities are safe for you.  Take over-the-counter and prescription medicines only as told by your health care provider.  Keep all follow-up visits as told by your health care provider. This is important. Contact a health care provider if you:  Have new symptoms or your symptoms get worse.  Do not feel like you are getting better or regaining strength.  Do not feel safe   or supported at home.  Are having trouble coping with your recovery. Get help right away if you:  Have trouble breathing.  Have trouble swallowing.  Develop a fever.  Choke after eating or drinking.  Cannot move.  Faint.  Have pain, swelling, warmth, or redness in an arm or  leg.  Have chest pain. Summary  Guillain-Barr syndrome (GBS) is a rare disorder in which the body's disease-fighting system (immune system) attacks the nerves.  GBS often requires treatment at a hospital. Treatment in the hospital may include medicines, plasmapheresis, getting proteins that slow down the immune system's attack on your nerves, or oxygen and breathing assistance.  Months of physical therapy may be needed until you regain your strength. This information is not intended to replace advice given to you by your health care provider. Make sure you discuss any questions you have with your health care provider. Document Released: 10/14/2002 Document Revised: 10/29/2017 Document Reviewed: 10/29/2017 Elsevier Interactive Patient Education  2019 [employer]  

## 2018-11-16 NOTE — ED Notes (Signed)
Pt returned from MRI °

## 2018-11-16 NOTE — ED Notes (Addendum)
Patient transported to MRI 

## 2018-11-17 DIAGNOSIS — F419 Anxiety disorder, unspecified: Secondary | ICD-10-CM

## 2018-11-17 DIAGNOSIS — E876 Hypokalemia: Secondary | ICD-10-CM

## 2018-11-17 LAB — BASIC METABOLIC PANEL
ANION GAP: 10 (ref 5–15)
BUN: 7 mg/dL (ref 6–20)
CALCIUM: 8.6 mg/dL — AB (ref 8.9–10.3)
CO2: 25 mmol/L (ref 22–32)
Chloride: 107 mmol/L (ref 98–111)
Creatinine, Ser: 0.55 mg/dL (ref 0.44–1.00)
GFR calc Af Amer: >60 mL/min (ref >60)
GFR calc non Af Amer: >60 mL/min (ref >60)
Glucose, Bld: 87 mg/dL (ref 70–99)
Potassium: 3.2 mmol/L — ABNORMAL LOW (ref 3.5–5.1)
Sodium: 142 mmol/L (ref 135–145)

## 2018-11-17 LAB — HIV ANTIBODY (ROUTINE TESTING W REFLEX): HIV Screen 4th Generation wRfx: NONREACTIVE

## 2018-11-17 LAB — COPPER, SERUM: Copper: 68 ug/dL — ABNORMAL LOW (ref 72–166)

## 2018-11-17 LAB — MAGNESIUM: MAGNESIUM: 1.9 mg/dL (ref 1.7–2.4)

## 2018-11-17 LAB — PHOSPHORUS: Phosphorus: 3.7 mg/dL (ref 2.5–4.6)

## 2018-11-17 MED ORDER — IMMUNE GLOBULIN (HUMAN) 20 GM/200ML IV SOLN
400.0000 mg/kg | INTRAVENOUS | Status: DC
  Filled 2018-11-17: qty 100

## 2018-11-17 MED ORDER — POTASSIUM CHLORIDE CRYS ER 20 MEQ PO TBCR
40.0000 meq | EXTENDED_RELEASE_TABLET | Freq: Once | ORAL | Status: AC
  Administered 2018-11-17: 40 meq via ORAL
  Filled 2018-11-17: qty 2

## 2018-11-17 MED ORDER — LIDOCAINE 5 % EX OINT
TOPICAL_OINTMENT | Freq: Four times a day (QID) | CUTANEOUS | Status: DC | PRN
  Filled 2018-11-17 (×2): qty 35.44

## 2018-11-17 MED ORDER — ENSURE ENLIVE PO LIQD
237.0000 mL | Freq: Two times a day (BID) | ORAL | Status: DC
  Administered 2018-11-17 – 2018-11-21 (×9): 237 mL via ORAL

## 2018-11-17 MED ORDER — IMMUNE GLOBULIN (HUMAN) 20 GM/200ML IV SOLN
400.0000 mg/kg | INTRAVENOUS | Status: AC
  Administered 2018-11-17 – 2018-11-21 (×5): 30 g via INTRAVENOUS
  Filled 2018-11-17 (×5): qty 100

## 2018-11-17 MED ORDER — ENSURE SURGERY PO LIQD: 237.0000 mL | Freq: Two times a day (BID) | ORAL | Status: DC

## 2018-11-17 NOTE — Evaluation (Signed)
Physical Therapy Evaluation Patient Details Name: Kimberly [name] MRN: [id number] DOB: Aug 01, 1986 Today's Date: 11/17/2018   History of Present Illness  Pt is a 33 y/o female admitted secondary to bilateral LE weakness. 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 PMH including but not limited to alcohol abuse, tobacco abuse and ADD.    Clinical Impression  Pt presented supine in bed with HOB elevated, awake and willing to participate in therapy session. Prior to admission, pt reported that she was independent with all functional mobility and ADLs. Pt lives with her husband and twin boys (52 years old) in a single level home with a level entry. Pt stated that she is the primary caregiver for her aunt and her mother who is "paralyzed from the waist down". Pt currently at supervision to min guard level overall for functional mobility with use of RW to ambulate. Pt with no LOB or need for physical assistance throughout. She demonstrates poor motor control and coordination throughout all extremities as well as sensory deficits. Pt would continue to benefit from skilled physical therapy services at this time while admitted and after d/c to address the below listed limitations in order to improve overall safety and independence with functional mobility.     Follow Up Recommendations Home health PT    Equipment Recommendations  Rolling walker with 5" wheels;3in1 (PT)    Recommendations for Other Services       Precautions / Restrictions Precautions Precautions: None Restrictions Weight Bearing Restrictions: No      Mobility  Bed Mobility Overal bed mobility: Modified Independent                Transfers Overall transfer level: Needs assistance Equipment used: Rolling walker (2 wheeled) Transfers: Sit to/from Stand Sit to Stand: Supervision         General transfer comment:  supervision for safety  Ambulation/Gait Ambulation/Gait assistance: Min guard Gait Distance (Feet): 200 Feet Assistive device: Rolling walker (2 wheeled) Gait Pattern/deviations: Step-through pattern;Decreased stride length;Ataxic Gait velocity: decreased   General Gait Details: poor motor control of bilateral LEs during ambulation with genu recurvatum bilaterally for stance phase. No buckling of knees noted. No LOB or need for physical assistance, min guard for safety  Stairs            Wheelchair Mobility    Modified Rankin (Stroke Patients Only)       Balance Overall balance assessment: Needs assistance Sitting-balance support: No upper extremity supported;Feet supported Sitting balance-Leahy Scale: Good     Standing balance support: Bilateral upper extremity supported Standing balance-Leahy Scale: Poor                               Pertinent Vitals/Pain Pain Assessment: Faces Faces Pain Scale: Hurts even more Pain Location: back Pain Descriptors / Indicators: Sore Pain Intervention(s): Monitored during session;Repositioned    Home Living Family/patient expects to be discharged to:: Private residence Living Arrangements: Spouse/significant other;Children Available Help at Discharge: Family;Available PRN/intermittently Type of Home: House Home Access: Level entry     Home Layout: One level Home Equipment: None      Prior Function Level of Independence: Independent         Comments: pt is primary caregiver for her mother and aunt     Hand Dominance        Extremity/Trunk Assessment  Upper Extremity Assessment Upper Extremity Assessment: RUE deficits/detail;LUE deficits/detail RUE Deficits / Details: pt grossly 3/5 for shoulder flex, elbow flexion and elbow extension with poor motor control during coordination testing (finger-to-nose); pt also with decreased sensation to light touch in hands and forearms LUE Deficits / Details: pt  grossly 3/5 for shoulder flex, elbow flexion and elbow extension with poor motor control during coordination testing (finger-to-nose); pt also with decreased sensation to light touch in hands and forearms    Lower Extremity Assessment Lower Extremity Assessment: RLE deficits/detail;LLE deficits/detail RLE Deficits / Details: MMT revealed 3+/5 for hip flexion, 5/5 for knee extension, 4/5 for knee flexion and 4/5 for ankle dorsiflexion. Pt with decreased sensation to light touch from mid thigh distally. LLE Deficits / Details: MMT revealed 3+/5 for hip flexion, 5/5 for knee extension, 4/5 for knee flexion and 4/5 for ankle dorsiflexion. Pt with decreased sensation to light touch from mid thigh distally.    Cervical / Trunk Assessment Cervical / Trunk Assessment: Normal  Communication   Communication: No difficulties  Cognition Arousal/Alertness: Awake/alert Behavior During Therapy: WFL for tasks assessed/performed Overall Cognitive Status: Within Functional Limits for tasks assessed                                        General Comments      Exercises     Assessment/Plan    PT Assessment Patient needs continued PT services  PT Problem List Decreased strength;Decreased activity tolerance;Decreased balance;Decreased mobility;Decreased coordination;Decreased knowledge of use of DME;Decreased safety awareness       PT Treatment Interventions DME instruction;Gait training;Stair training;Functional mobility training;Therapeutic activities;Therapeutic exercise;Balance training;Neuromuscular re-education;Patient/family education    PT Goals (Current goals can be found in the Care Plan section)  Acute Rehab PT Goals Patient Stated Goal: to get stronger PT Goal Formulation: With patient Time For Goal Achievement: 12/01/18 Potential to Achieve Goals: Good    Frequency Min 3X/week   Barriers to discharge        Co-evaluation               AM-PAC PT "6  Clicks" Mobility  Outcome Measure Help needed turning from your back to your side while in a flat bed without using bedrails?: None Help needed moving from lying on your back to sitting on the side of a flat bed without using bedrails?: None Help needed moving to and from a bed to a chair (including a wheelchair)?: A Little Help needed standing up from a chair using your arms (e.g., wheelchair or bedside chair)?: None Help needed to walk in hospital room?: A Little Help needed climbing 3-5 steps with a railing? : A Little 6 Click Score: 21    End of Session Equipment Utilized During Treatment: Gait belt Activity Tolerance: Patient tolerated treatment well Patient left: in chair;with call bell/phone within reach Nurse Communication: Mobility status PT Visit Diagnosis: Other abnormalities of gait and mobility (R26.89);Muscle weakness (generalized) (M62.81)    Time: 1884-1660 PT Time Calculation (min) (ACUTE ONLY): 30 min   Charges:   PT Evaluation $PT Eval Moderate Complexity: 1 Mod PT Treatments $Gait Training: 8-22 mins        Sherie Don, [place], DPT  Acute Rehabilitation Services Pager [phone] Office [place] 11/17/2018, 11:20 AM

## 2018-11-17 NOTE — Progress Notes (Signed)
Initial Nutrition Assessment  DOCUMENTATION CODES:  Not applicable  INTERVENTION:  Ensure Enlive po BID, each supplement provides 350 kcal and 20 grams of protein  NUTRITION DIAGNOSIS:  Inadequate oral intake related to vomiting, nausea as evidenced by per patient/family report.  GOAL:  Patient will meet greater than or equal to 90% of their needs  MONITOR:  Labs, Skin, Weight trends, PO intake, Supplement acceptance  REASON FOR ASSESSMENT:  Malnutrition Screening Tool    ASSESSMENT:  33 y/o female PMHx Anxiety, ADHD, IBS, Severe Hemorrhoids. Was seen OP by neurologist for swelling of hands/feet, BLE weakness x1 week and gradually worsening numbness, first to legs and then to chest. Referred to ED over concern for GBS. Admitted for further evaluation.     Pt reports she has been unable to keep much food down recently. She says for the past two weeks, she would experience severe n/v with meals. She says if she did not vomit, she would have diarrhea. However, she reports to have all of these issues on a chronic basis; she has chronic diarrhea, for which she takes Viberzi PRN and she also always has some level of N/V-she clarifies it just has been worse for the past 2 weeks. She also reports severe hemorrhoids. She follows w/ a GI doctor regularly.  Despite frequent GI upset, she denies following any type of therapeutic diet and does not have any foods she must avoid. She takes a MVI at baseline. She does not take any other supplements.   Weight wise, she believes she has lost weight. She says she was up to 165 lbs recently. Per chart, she had gradually gained weight over the past 5 years and had pluateued at a weight of 158-160 in 2019. She has had an acute loss and is currently 152 lbs.   At this time, she still says her appetite is below baseline. She agreed to Ensure. RD took a few meal requests and briefly reviewed how to use menu to order meals.   Though pt meets wt loss criteria  for malnutrition, feel her intake hx is too vague to use in diagnosis. No wasting  Labs: K:3.2,  Meds: PPI, PRN zofran/morphine  Recent Labs  Lab 11/11/18 1021 11/16/18 1025 11/16/18 1345 11/17/18 0431  NA 138 137  --  142  K 3.4* 2.4*  --  3.2*  CL 97* 99  --  107  CO2 28 25  --  25  BUN 10 6  --  7  CREATININE 0.86 0.58  --  0.55  CALCIUM 9.3 9.5  --  8.6*  MG  --   --  1.9 1.9  PHOS  --   --   --  3.7  GLUCOSE 119* 130*  --  87   NUTRITION - FOCUSED PHYSICAL EXAM:   Most Recent Value  Orbital Region  No depletion  Upper Arm Region  No depletion  Thoracic and Lumbar Region  No depletion  Buccal Region  No depletion  Temple Region  No depletion  Clavicle Bone Region  No depletion  Clavicle and Acromion Bone Region  No depletion  Scapular Bone Region  No depletion  Dorsal Hand  No depletion  Patellar Region  No depletion  Anterior Thigh Region  No depletion  Posterior Calf Region  No depletion     Diet Order:   Diet Order            Diet regular Room service appropriate? Yes; Fluid consistency: Thin  Diet effective  now             EDUCATION NEEDS:  No education needs have been identified at this time  Skin:  Skin Assessment: Reviewed RN Assessment  Last BM:  1/10  Height:  Ht Readings from Last 1 Encounters:  11/16/18 5\' 7"  (1.702 m)   Weight:  Wt Readings from Last 1 Encounters:  11/16/18 68.9 kg   Wt Readings from Last 10 Encounters:  11/16/18 68.9 kg  11/16/18 68.9 kg  11/11/18 72.6 kg  10/19/18 71.7 kg  09/24/18 72.2 kg  06/29/18 72.6 kg  03/05/18 71.7 kg  01/03/18 72.6 kg  08/02/17 72.1 kg  12/12/16 71.7 kg   Ideal Body Weight:  61.36 kg  BMI:  Body mass index is 23.79 kg/m.  Estimated Nutritional Needs:  Kcal:  1700-1850 kcals (25-27 kcal/kg bw) Protein:  76-90g Pro (1.1-1.3g/kg bw) Fluid:  1.7-1.9 L fluid (1 ml/kcal)  Burtis Junes RD, LDN, CNSC Clinical Nutrition Available Tues-Sat via Pager: [phone] 11/17/2018 3:34 PM

## 2018-11-17 NOTE — Evaluation (Signed)
Occupational Therapy Evaluation Patient Details Name: Kimberly [name] MRN: [id number] DOB: 1985-12-22 Today's Date: 11/17/2018    History of Present Illness Pt is a 33 y/o female admitted secondary to bilateral LE weakness. 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 PMH including but not limited to alcohol abuse, tobacco abuse and ADD.   Clinical Impression   PTA patient independent and primary caregiver for aunt/mother.  [place] admitted for above and limited by problem list below, including: impaired sensation, back pain, generalized weakness (proximal>distal), impaired balance, poor coordination, and decreased activity tolerance.  Patient requires min guard for LB ADLs, min guard for grooming standing at sink, min guard for toilet transfers, and min guard for mobility (given cueing for forward gaze).  Patient will benefit from continued OT services while admitted and after dc at [facility] level in order to optimize independence and safety with ADLs/mobility to return to PLOF.      Follow Up Recommendations  Home health OT    Equipment Recommendations  3 in 1 bedside commode    Recommendations for Other Services       Precautions / Restrictions Precautions Precautions: None Restrictions Weight Bearing Restrictions: No      Mobility Bed Mobility Overal bed mobility: Modified Independent                Transfers Overall transfer level: Needs assistance Equipment used: Rolling walker (2 wheeled) Transfers: Sit to/from Stand Sit to Stand: Supervision;Min guard         General transfer comment: supervision for safety for basic sit to stand, min guard for safety during transfers to commode    Balance Overall balance assessment: Needs assistance Sitting-balance support: No upper extremity supported;Feet supported Sitting balance-Leahy Scale: Good     Standing balance  support: Bilateral upper extremity supported Standing balance-Leahy Scale: Poor Standing balance comment: reliant on support                            ADL either performed or assessed with clinical judgement   ADL Overall ADL's : Needs assistance/impaired     Grooming: Min guard;Wash/dry hands;Standing Grooming Details (indicate cue type and reason): noted forward lean to support self with hips Upper Body Bathing: Set up;Sitting   Lower Body Bathing: Min guard;Sit to/from stand   Upper Body Dressing : Set up;Sitting   Lower Body Dressing: Min guard;Sit to/from stand   Toilet Transfer: Min guard;Ambulation;RW Toilet Transfer Details (indicate cue type and reason): simulated         Functional mobility during ADLs: Min guard;Rolling walker(cueing for foward head posture (looking down)) General ADL Comments: Patient limited by impaired balance and generalized weakness (proximal> distal)      Vision Baseline Vision/History: Wears glasses Wears Glasses: At all times Patient Visual Report: No change from baseline Vision Assessment?: No apparent visual deficits     Perception     Praxis      Pertinent Vitals/Pain Pain Assessment: Faces Faces Pain Scale: Hurts little more Pain Location: back Pain Descriptors / Indicators: Sore Pain Intervention(s): Monitored during session;Repositioned     Hand Dominance Right   Extremity/Trunk Assessment Upper Extremity Assessment Upper Extremity Assessment: RUE deficits/detail;LUE deficits/detail RUE Deficits / Details: grossly 3/5 proximally (shoulders) and 3+/5 distally (elbows, hands), poor coordination, and decreased sensation in hands/forearms anteriorly  LUE Deficits / Details: grossly 3/5 proximally (shoulders) and 3+/5 distally (  elbows, hands), poor coordination, and decreased sensation in hands/forearms anteriorly    Lower Extremity Assessment Lower Extremity Assessment: Defer to PT evaluation   Cervical /  Trunk Assessment Cervical / Trunk Assessment: Normal   Communication Communication Communication: No difficulties   Cognition Arousal/Alertness: Awake/alert Behavior During Therapy: WFL for tasks assessed/performed Overall Cognitive Status: Within Functional Limits for tasks assessed                                     General Comments       Exercises     Shoulder Instructions      Home Living Family/patient expects to be discharged to:: Private residence Living Arrangements: Spouse/significant other;Children Available Help at Discharge: Family;Available PRN/intermittently Type of Home: House Home Access: Level entry     Home Layout: One level     Bathroom Shower/Tub: Teacher, early years/pre: Standard     Home Equipment: None          Prior Functioning/Environment Level of Independence: Independent        Comments: pt is primary caregiver for her mother and aunt        OT Problem List: Decreased strength;Decreased activity tolerance;Impaired balance (sitting and/or standing);Decreased coordination;Impaired sensation;Pain;Decreased knowledge of use of DME or AE      OT Treatment/Interventions: Self-care/ADL training;Neuromuscular education;DME and/or AE instruction;Therapeutic exercise;Energy conservation;Therapeutic activities;Patient/family education;Balance training    OT Goals(Current goals can be found in the care plan section) Acute Rehab OT Goals Patient Stated Goal: to get stronger OT Goal Formulation: With patient Time For Goal Achievement: 12/01/18 Potential to Achieve Goals: Good  OT Frequency: Min 3X/week   Barriers to D/C:            Co-evaluation              AM-PAC OT "6 Clicks" Daily Activity     Outcome Measure Help from another person eating meals?: None Help from another person taking care of personal grooming?: A Little Help from another person toileting, which includes using toliet, bedpan, or  urinal?: A Little Help from another person bathing (including washing, rinsing, drying)?: A Little Help from another person to put on and taking off regular upper body clothing?: None Help from another person to put on and taking off regular lower body clothing?: A Little 6 Click Score: 20   End of Session Equipment Utilized During Treatment: Rolling walker Nurse Communication: Mobility status  Activity Tolerance: Patient tolerated treatment well Patient left: in bed;with call bell/phone within reach  OT Visit Diagnosis: Other abnormalities of gait and mobility (R26.89);Muscle weakness (generalized) (M62.81)                Time: 2836-6294 OT Time Calculation (min): 18 min Charges:  OT General Charges $OT Visit: 1 Visit OT Evaluation $OT Eval Moderate Complexity: [place], OT Acute Rehabilitation Services Pager [phone] Office [phone]   Delight Stare 11/17/2018, 3:10 PM

## 2018-11-17 NOTE — Progress Notes (Addendum)
NEUROLOGY PROGRESS NOTE  Subjective: Ascending LE loss of sensation and weakness,  paraesthesia  CC-"can't fel my feet"   PMH- IBS, remote alcohol abuse, tobacco abuse, LE edema, s/p diuresis (PCP 12/31) Sequelae, subsequent hypokalemia (ER 11/11/17), diarrhea, UTI,  hypovolemia and dehydration   -Presented to ER/ sent from general neurology seen by Dr. Jaynee Eagles on 11/16/2018 ascending weakness, and paraesthesia w/ hyporeflexia, s/p viral illness. Raise high suspicion for Guillian Barre syndrome    Exam: Vitals:   11/17/18 0346 11/17/18 0721  BP: 103/71 115/80  Pulse: 94 90  Resp: 18 18  Temp: 98.7 F (37.1 C) 98.6 F (37 C)  SpO2: 97% 100%   General ROS: negative for - chills, fatigue, fever, night sweats, weight gain or weight loss Psychological ROS: negative for - behavioral disorder, hallucinations, memory difficulties, mood swings or suicidal ideation Ophthalmic ROS: negative for - blurry vision, double vision, impaired and delayed upward gaze with >3 sec with discomfort, no nystagmus  ENT ROS: negative for - epistaxis, nasal discharge, oral lesions, sore throat, tinnitus or vertigo Allergy and Immunology ROS: negative for - hives or itchy/watery eyes Hematological and Lymphatic ROS: negative for - bleeding problems, bruising or swollen lymph nodes Endocrine ROS: negative for - galactorrhea, hair pattern changes, polydipsia/polyuria or temperature intolerance Respiratory ROS: negative for - cough, hemoptysis, shortness of breath or wheezing Cardiovascular ROS: negative for - chest pain, dyspnea on exertion, edema or irregular heartbeat Gastrointestinal ROS: +Diarrhea. negative for - abdominal pain, hematemesis, nausea/vomiting or stool incontinence Genito-Urinary ROS: negative for - dysuria, hematuria, incontinence positive for urinary urgency " doesn't feel like I can hold it as long" "feels different. Notes a recent UTI  Musculoskeletal ROS: negative for - joint swelling , positive  for muscle weakness  Neurological ROS: hyporeflexia, BLE weakness,  lower paraesthesia remains now ascending to arms, sensory decline on right side of face- every where else normal on exam  Dermatological ROS: negative for rash and skin lesion changes Physical Exam   HEENT-  Normocephalic, no lesions, without obvious abnormality.  Delayed upward gaze and discomfort >3 secs    Cardiovascular- S1-S2 audible, pulses palpable throughout   Lungs-no rhonchi or wheezing noted, no excessive working breathing.  Saturations within normal limits- able to hold breath and count to 35 without breathlessness or desaturation  Abdomen- All 4 quadrants palpated and nontender Extremities- Warm, dry and intact Musculoskeletal-no joint tenderness, deformity or swelling Skin-warm and dry, no hyperpigmentation, vitiligo, or suspicious lesions    Neuro:  General - min distress Heart - Regular rate and rhythm - no murmer Lungs - Clear to auscultation Abdomen - Soft - non tender Extremities - Distal pulses intact - no edema Skin - Warm and dry  Neurologic Examination:   Mental Status:  Alert, oriented,x 3. Speech without evidence of dysarthria or aphasia. Able to follow 3 step commands without difficulty.  Cranial Nerves:  II-bilateral visual fields intact III/IV/VI-Pupils were equal and reacted +3. Upward delay in upward gaze >3 secs  V/VII-no facial numbness and no facial weakness  VIII-hearing normal.  X-normal speech and symmetrical palatal movement.  XII-midline tongue extension  Motor: Right :  Upper extremity   4-/5                                      Left:     Upper extremity   3/5  Lower extremity   4-/5                                                  Lower extremity   4-/5 Tone and bulk: decreased  LE tone; no atrophy noted Sensory: decreased in bilat LE up to torso ~T5- now including upper ext weakness, numbness and tingling  Deep Tendon Reflexes: areflexive in bilat LEs, UE  are 2/4 Plantars: mute Cerebellar: RUE FTN dysmetria, normal on left,  Dysmetria with heel to shin bilaterally,  Gait: not tested   Medications:  I have reviewed the patient's current medications. Scheduled: . enoxaparin (LOVENOX) injection  40 mg Subcutaneous Q24H  . Immune Globulin 10%  400 mg/kg Intravenous Q24 Hr x 5  . pantoprazole  40 mg Oral Daily   Continuous: . 0.9 % NaCl with KCl 40 mEq / L 125 mL/hr (11/17/18 0445)   LOV:FIEPPIRJJOACZ **OR** acetaminophen, ibuprofen, loperamide, morphine injection, ondansetron (ZOFRAN) IV, ondansetron  Pertinent Labs/Diagnostics IgA- pending Sedrate- normal- 1 CBC- WBC without leukocytosis 8.5 , Hbg 15.7, MCV 103.9/ MCH 34.4- Anemia,  B12 normal 802, Folate pending,  TSH- 3.18  LP- RBC 253/135,000, Traumatic tap likely, Protein 1, WBC 1/51- Glucose 74- normal overall  Ct Head Wo Contrast  Result Date: 11/16/2018 CLINICAL DATA:  Left-sided numbness and tingling EXAM: CT HEAD WITHOUT CONTRAST TECHNIQUE: Contiguous axial images were obtained from the base of the skull through the vertex without intravenous contrast. COMPARISON:  September 04, 2007 FINDINGS: Brain: Ventricles are normal in size and configuration. There is no intracranial mass, hemorrhage, extra-axial fluid collection, or midline shift. Brain parenchyma appears unremarkable. No acute infarct evident. Vascular: There is no evident hyperdense vessel. There is minimal calcification in each carotid siphon region. Skull: The bony calvarium appears intact. Sinuses/Orbits: Visualized paranasal sinuses are clear. Visualized orbits appear symmetric bilaterally. Other: Mastoid air cells are clear. IMPRESSION: Minimal arterial vascular calcification. Study otherwise unremarkable. Electronically Signed   By: Lowella Grip III M.D.   On: 11/16/2018 11:46   Mr Cervical Spine W Or Wo Contrast  Result Date: 11/16/2018 CLINICAL DATA:  Ascending lower extremity weakness, numbness, and  tingling, now to the level of T5 on neurological exam. Hypo over flecks of lower extremities. Recent viral gastrointestinal illness a few weeks ago. EXAM: MRI CERVICAL, THORACIC AND LUMBAR SPINE WITH AND WITHOUT CONTRAST TECHNIQUE: Multiplanar and multiecho pulse sequences of the cervical spine, to include the craniocervical junction and cervicothoracic junction, and thoracic and lumbar spine, were obtained without intravenous contrast. COMPARISON:  CT thoracic spine dated July 21, 2013. CT abdomen pelvis dated July 20, 2004. FINDINGS: MRI CERVICAL SPINE FINDINGS Alignment: Physiologic. Vertebrae: No acute fracture, evidence of discitis, or bone lesion. Old mild T1 superior endplate compression deformity. Cord: Normal signal and morphology.  No intradural enhancement. Posterior Fossa, vertebral arteries, paraspinal tissues: Negative. Disc levels: C2-C3:  Negative. C3-C4:  Negative. C4-C5:  Minimal disc bulge.  No stenosis. C5-C6:  Tiny central disc protrusion.  No stenosis. C6-C7:  Negative. C7-T1:  Negative. MRI THORACIC SPINE FINDINGS Alignment:  Slight focal kyphosis at T12. Vertebrae: No acute fracture, evidence of discitis, or bone lesion. Chronic mild T12 compression deformity status post cement augmentation 2 mm retropulsion of the posterior superior endplate. Chronic mild superior endplate compression deformities of T1 and T3. Cord:  Normal signal and morphology.  No intradural  enhancement. Paraspinal and other soft tissues: Subsegmental atelectasis in both lower lobes. Disc levels: Mild disc degeneration at T6-T7 without significant disc bulge or herniation. Small diffuse disc bulge at T11-T12. No spinal canal or neuroforaminal stenosis at any level. MRI LUMBAR SPINE FINDINGS Segmentation: Assumed standard. The last well-formed disc space is designated L5-S1 for the purposes of this report. Alignment:  Physiologic. Vertebrae: No acute fracture, evidence of discitis, or suspicious bone lesion. L3  hemangioma. Conus medullaris and cauda equina: Conus extends to the L1-L2 level. Conus and cauda equina appear normal. Mild enhancement of the ventral right S1 nerve root. No other abnormal intradural enhancement. Paraspinal and other soft tissues: Negative. Disc levels: T12-L1 to L4-L5:  Negative. L5-S1: Small central disc protrusion with annular fissure. No stenosis. IMPRESSION: Cervical spine: 1. No acute abnormality. 2. Minimal degenerative disc disease at C4-C5 and C5-C6. Thoracic spine: 1. No acute abnormality. 2. Chronic T1, T3, and T12 compression deformities. 3. Mild degenerative disc disease at T6-T7 and T11-T12. Lumbar spine: 1. Mild enhancement of the ventral right S1 nerve root, consistent with radiculitis. No additional abnormal intradural enhancement. 2. Mild degenerative disc disease at L5-S1. Electronically Signed   By: Titus Dubin M.D.   On: 11/16/2018 17:24   Mr Thoracic Spine W Wo Contrast  Result Date: 11/16/2018 CLINICAL DATA:  Ascending lower extremity weakness, numbness, and tingling, now to the level of T5 on neurological exam. Hypo over flecks of lower extremities. Recent viral gastrointestinal illness a few weeks ago. EXAM: MRI CERVICAL, THORACIC AND LUMBAR SPINE WITH AND WITHOUT CONTRAST TECHNIQUE: Multiplanar and multiecho pulse sequences of the cervical spine, to include the craniocervical junction and cervicothoracic junction, and thoracic and lumbar spine, were obtained without intravenous contrast. COMPARISON:  CT thoracic spine dated July 21, 2013. CT abdomen pelvis dated July 20, 2004. FINDINGS: MRI CERVICAL SPINE FINDINGS Alignment: Physiologic. Vertebrae: No acute fracture, evidence of discitis, or bone lesion. Old mild T1 superior endplate compression deformity. Cord: Normal signal and morphology.  No intradural enhancement. Posterior Fossa, vertebral arteries, paraspinal tissues: Negative. Disc levels: C2-C3:  Negative. C3-C4:  Negative. C4-C5:  Minimal disc  bulge.  No stenosis. C5-C6:  Tiny central disc protrusion.  No stenosis. C6-C7:  Negative. C7-T1:  Negative. MRI THORACIC SPINE FINDINGS Alignment:  Slight focal kyphosis at T12. Vertebrae: No acute fracture, evidence of discitis, or bone lesion. Chronic mild T12 compression deformity status post cement augmentation 2 mm retropulsion of the posterior superior endplate. Chronic mild superior endplate compression deformities of T1 and T3. Cord:  Normal signal and morphology.  No intradural enhancement. Paraspinal and other soft tissues: Subsegmental atelectasis in both lower lobes. Disc levels: Mild disc degeneration at T6-T7 without significant disc bulge or herniation. Small diffuse disc bulge at T11-T12. No spinal canal or neuroforaminal stenosis at any level. MRI LUMBAR SPINE FINDINGS Segmentation: Assumed standard. The last well-formed disc space is designated L5-S1 for the purposes of this report. Alignment:  Physiologic. Vertebrae: No acute fracture, evidence of discitis, or suspicious bone lesion. L3 hemangioma. Conus medullaris and cauda equina: Conus extends to the L1-L2 level. Conus and cauda equina appear normal. Mild enhancement of the ventral right S1 nerve root. No other abnormal intradural enhancement. Paraspinal and other soft tissues: Negative. Disc levels: T12-L1 to L4-L5:  Negative. L5-S1: Small central disc protrusion with annular fissure. No stenosis. IMPRESSION: Cervical spine: 1. No acute abnormality. 2. Minimal degenerative disc disease at C4-C5 and C5-C6. Thoracic spine: 1. No acute abnormality. 2. Chronic T1, T3, and T12  compression deformities. 3. Mild degenerative disc disease at T6-T7 and T11-T12. Lumbar spine: 1. Mild enhancement of the ventral right S1 nerve root, consistent with radiculitis. No additional abnormal intradural enhancement. 2. Mild degenerative disc disease at L5-S1. Electronically Signed   By: Titus Dubin M.D.   On: 11/16/2018 17:24   Mr Lumbar Spine W Wo  Contrast  Result Date: 11/16/2018 CLINICAL DATA:  Ascending lower extremity weakness, numbness, and tingling, now to the level of T5 on neurological exam. Hypo over flecks of lower extremities. Recent viral gastrointestinal illness a few weeks ago. EXAM: MRI CERVICAL, THORACIC AND LUMBAR SPINE WITH AND WITHOUT CONTRAST TECHNIQUE: Multiplanar and multiecho pulse sequences of the cervical spine, to include the craniocervical junction and cervicothoracic junction, and thoracic and lumbar spine, were obtained without intravenous contrast. COMPARISON:  CT thoracic spine dated July 21, 2013. CT abdomen pelvis dated July 20, 2004. FINDINGS: MRI CERVICAL SPINE FINDINGS Alignment: Physiologic. Vertebrae: No acute fracture, evidence of discitis, or bone lesion. Old mild T1 superior endplate compression deformity. Cord: Normal signal and morphology.  No intradural enhancement. Posterior Fossa, vertebral arteries, paraspinal tissues: Negative. Disc levels: C2-C3:  Negative. C3-C4:  Negative. C4-C5:  Minimal disc bulge.  No stenosis. C5-C6:  Tiny central disc protrusion.  No stenosis. C6-C7:  Negative. C7-T1:  Negative. MRI THORACIC SPINE FINDINGS Alignment:  Slight focal kyphosis at T12. Vertebrae: No acute fracture, evidence of discitis, or bone lesion. Chronic mild T12 compression deformity status post cement augmentation 2 mm retropulsion of the posterior superior endplate. Chronic mild superior endplate compression deformities of T1 and T3. Cord:  Normal signal and morphology.  No intradural enhancement. Paraspinal and other soft tissues: Subsegmental atelectasis in both lower lobes. Disc levels: Mild disc degeneration at T6-T7 without significant disc bulge or herniation. Small diffuse disc bulge at T11-T12. No spinal canal or neuroforaminal stenosis at any level. MRI LUMBAR SPINE FINDINGS Segmentation: Assumed standard. The last well-formed disc space is designated L5-S1 for the purposes of this report.  Alignment:  Physiologic. Vertebrae: No acute fracture, evidence of discitis, or suspicious bone lesion. L3 hemangioma. Conus medullaris and cauda equina: Conus extends to the L1-L2 level. Conus and cauda equina appear normal. Mild enhancement of the ventral right S1 nerve root. No other abnormal intradural enhancement. Paraspinal and other soft tissues: Negative. Disc levels: T12-L1 to L4-L5:  Negative. L5-S1: Small central disc protrusion with annular fissure. No stenosis. IMPRESSION: Cervical spine: 1. No acute abnormality. 2. Minimal degenerative disc disease at C4-C5 and C5-C6. Thoracic spine: 1. No acute abnormality. 2. Chronic T1, T3, and T12 compression deformities. 3. Mild degenerative disc disease at T6-T7 and T11-T12. Lumbar spine: 1. Mild enhancement of the ventral right S1 nerve root, consistent with radiculitis. No additional abnormal intradural enhancement. 2. Mild degenerative disc disease at L5-S1. Electronically Signed   By: Titus Dubin M.D.   On: 11/16/2018 17:24     Assessment: 33 year old female with past medical history of alcohol abuse presents to the emergency room slightly greater than 1 week history of bilateral lower extreme numbness gradually extending upwards to normal upper extremities.  He also had   viral GI illness in December.  She also has weakness of both lower extremities as well as  ataxia on examination..  Areflexic in both lower extremities,decreased in upper extremities. Now with paraesthesia and weakness of arms, and upwards gaze delay and discomfort >3 secs. No idincation of resp distress present   Impression: GB/ ADD versus nutritional deficiency causing polyneuropathy History and physical  exam is very concerning for acute inflammatory demyelinating polyradiculoneuropathy Mardelle Matte).  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   Recommendations: 1)  Emperical tx of IVIG -> GB 2) lab remain pending, B12 normal, started cyanocobalamin 3) add Gq1b- antibodies may be present with opthalmoparesis- Gq1B antibody for Lonia Blood syndrome- confirmed by Maryan Puls Send out lab for [phone], IMM CO diagnostic, 1 mL into with minimum of 0.25 for send off Plain red or gel tube 4) Monitor for signs and symptoms post infusion reaction, swelling, fever, SOB, stop infusion and report to neurology immediately   Discussed treatment plan with patient for IVIG. Discussed risk an benefits associated with treatment. She did inquire about Plex (plasma pheresis) explained that IVIG is the 1st step in the case and that Plasma pheresis is mopre invasive and will be discussed if needed. At this time it is not indicated    Lilyan Gilford Neuro hospitalist NP   11/17/2018, 7:59 AM  NEUROHOSPITALIST ADDENDUM Performed a face to face diagnostic evaluation.   I have reviewed the contents of history and physical exam as documented by PA/ARNP/Resident and agree with above documentation.  I have discussed and formulated the above plan as documented. Edits to the note have been made as needed.  Patient appears to be clinically about the same compared to yesterday.  However patient feels s slightly weaker in the upper extremities.  He is walking with a walker.  B12 return is 800.  Started empiric IVIG x 5 days for presumed Iline Oven.  Added anti-GQ 1B antibodies.   Karena Addison  MD [facility] [phone]   If 7pm to 7am, please call on call as listed on AMION.

## 2018-11-17 NOTE — Progress Notes (Signed)
PROGRESS NOTE    Kimberly [name]  UTM:[id number] DOB: 1986/08/20 DOA: 11/16/2018 PCP: [name], MD   Brief Narrative: Kimberly [name] is a 33 y.o. female with medical history significant of anxiety, ADHD, hypokalemia. She presented with ascending numbness, lower extremity weakness and ataxia. Concern for Guillain-Barre vs nutritional deficiency. IVIG started 1/11.   Assessment & Plan:   Principal Problem:   Weakness of both lower extremities Active Problems:   Hypokalemia   Anxiety   Bilateral leg weakness Concern for Guillain-Barre vs nutritional deficiency per neurology. Started IVIG today -Neuro recommendations: multiple labs sent  Hemorrhoids Patient will need to follow-up outpatient -Lidocaine cream  Hypokalemia Chronic. Given supplementation.  UTI Present on admission. Nitrofurantoin discontinued on discharge.   DVT prophylaxis: Lovenox Code Status:   Code Status: Full Code Family Communication: None at bedside Disposition Plan: Discharge pending neurology management   Consultants:   Neurology  Procedures:   IVIG (1/11>>  Antimicrobials:  None    Subjective: Rectal pain. Continued weakness/numbness  Objective: Vitals:   11/16/18 2020 11/17/18 0023 11/17/18 0346 11/17/18 0721  BP:  (!) 101/58 103/71 115/80  Pulse:  97 94 90  Resp:  18 18 18   Temp:  98.1 F (36.7 C) 98.7 F (37.1 C) 98.6 F (37 C)  TempSrc:  Oral Oral Oral  SpO2:  95% 97% 100%  Weight: 68.9 kg     Height: 5\' 7"  (1.702 m)       Intake/Output Summary (Last 24 hours) at 11/17/2018 0855 Last data filed at 11/16/2018 1812 Gross per 24 hour  Intake 1100 ml  Output -  Net 1100 ml   Filed Weights   11/16/18 2020  Weight: 68.9 kg    Examination:  General exam: Appears calm and comfortable Respiratory system: Clear to auscultation. Respiratory effort normal. Cardiovascular system: S1 & S2 heard, RRR. No murmurs, rubs, gallops or clicks. Gastrointestinal  system: Abdomen is nondistended, soft and nontender. No organomegaly or masses felt. Normal bowel sounds heard. Central nervous system: Alert and oriented. Extremities: No edema. No calf tenderness Skin: No cyanosis. No rashes Psychiatry: Judgement and insight appear normal. Mood & affect appropriate.     Data Reviewed: I have personally reviewed following labs and imaging studies  CBC: Recent Labs  Lab 11/11/18 1021 11/16/18 1025  WBC 14.7* 8.5  NEUTROABS  --  6.2  HGB 16.2* 15.7*  HCT 49.9* 47.4*  MCV 106.6* 103.9*  PLT 331 568   Basic Metabolic Panel: Recent Labs  Lab 11/11/18 1021 11/16/18 1025 11/16/18 1345 11/17/18 0431  NA 138 137  --  142  K 3.4* 2.4*  --  3.2*  CL 97* 99  --  107  CO2 28 25  --  25  GLUCOSE 119* 130*  --  87  BUN 10 6  --  7  CREATININE 0.86 0.58  --  0.55  CALCIUM 9.3 9.5  --  8.6*  MG  --   --  1.9 1.9  PHOS  --   --   --  3.7   GFR: Estimated Creatinine Clearance: 96.4 mL/min (by C-G formula based on SCr of 0.55 mg/dL). Liver Function Tests: Recent Labs  Lab 11/11/18 1021 11/16/18 1025  AST 57* 48*  ALT 24 37  ALKPHOS 61 64  BILITOT 1.9* 1.1  PROT 6.6 6.7  ALBUMIN 3.5 3.7   Recent Labs  Lab 11/11/18 1021  LIPASE 41   No results for input(s): AMMONIA in the last 168 hours. Coagulation  Profile: No results for input(s): INR, PROTIME in the last 168 hours. Cardiac Enzymes: No results for input(s): CKTOTAL, CKMB, CKMBINDEX, TROPONINI in the last 168 hours. BNP (last 3 results) No results for input(s): PROBNP in the last 8760 hours. HbA1C: Recent Labs    11/16/18 1518  HGBA1C 4.9   CBG: No results for input(s): GLUCAP in the last 168 hours. Lipid Profile: No results for input(s): CHOL, HDL, LDLCALC, TRIG, CHOLHDL, LDLDIRECT in the last 72 hours. Thyroid Function Tests: Recent Labs    11/16/18 1518  TSH 3.186   Anemia Panel: Recent Labs    11/16/18 1517  VITAMINB12 802   Sepsis Labs: Recent Labs  Lab  11/16/18 1118  LATICACIDVEN 2.85*    Recent Results (from the past 240 hour(s))  Urine culture     Status: Abnormal   Collection Time: 11/11/18  1:37 PM  Result Value Ref Range Status   Specimen Description URINE, RANDOM  Final   Special Requests   Final    NONE Performed at [facility], [place] [address] [place], [place] [address]    Culture >=100,000 COLONIES/mL ESCHERICHIA COLI (A)  Final   Report Status 11/13/2018 FINAL  Final   Organism ID, Bacteria ESCHERICHIA COLI (A)  Final      Susceptibility   Escherichia coli - MIC*    AMPICILLIN <=2 SENSITIVE Sensitive     CEFAZOLIN <=4 SENSITIVE Sensitive     CEFTRIAXONE <=1 SENSITIVE Sensitive     CIPROFLOXACIN <=0.25 SENSITIVE Sensitive     GENTAMICIN <=1 SENSITIVE Sensitive     IMIPENEM <=0.25 SENSITIVE Sensitive     NITROFURANTOIN <=16 SENSITIVE Sensitive     TRIMETH/SULFA <=20 SENSITIVE Sensitive     AMPICILLIN/SULBACTAM <=2 SENSITIVE Sensitive     PIP/TAZO <=4 SENSITIVE Sensitive     Extended ESBL NEGATIVE Sensitive     * >=100,000 COLONIES/mL ESCHERICHIA COLI  CSF culture     Status: None (Preliminary result)   Collection Time: 11/16/18 10:37 AM  Result Value Ref Range Status   Specimen Description CSF  Final   Special Requests NONE  Final   Gram Stain CYTOSPIN SMEAR NO WBC SEEN NO ORGANISMS SEEN   Final   Culture   Final    NO GROWTH < 24 HOURS Performed at [facility], [address], [place], [place] [address]    Report Status PENDING  Incomplete         Radiology Studies: Ct Head Wo Contrast  Result Date: 11/16/2018 CLINICAL DATA:  Left-sided numbness and tingling EXAM: CT HEAD WITHOUT CONTRAST TECHNIQUE: Contiguous axial images were obtained from the base of the skull through the vertex without intravenous contrast. COMPARISON:  September 04, 2007 FINDINGS: Brain: Ventricles are normal in size and configuration. There is no intracranial mass, hemorrhage, extra-axial fluid collection, or midline  shift. Brain parenchyma appears unremarkable. No acute infarct evident. Vascular: There is no evident hyperdense vessel. There is minimal calcification in each carotid siphon region. Skull: The bony calvarium appears intact. Sinuses/Orbits: Visualized paranasal sinuses are clear. Visualized orbits appear symmetric bilaterally. Other: Mastoid air cells are clear. IMPRESSION: Minimal arterial vascular calcification. Study otherwise unremarkable. Electronically Signed   By: Lowella Grip III M.D.   On: 11/16/2018 11:46   Mr Cervical Spine W Or Wo Contrast  Result Date: 11/16/2018 CLINICAL DATA:  Ascending lower extremity weakness, numbness, and tingling, now to the level of T5 on neurological exam. Hypo over flecks of lower extremities. Recent viral gastrointestinal illness  a few weeks ago. EXAM: MRI CERVICAL, THORACIC AND LUMBAR SPINE WITH AND WITHOUT CONTRAST TECHNIQUE: Multiplanar and multiecho pulse sequences of the cervical spine, to include the craniocervical junction and cervicothoracic junction, and thoracic and lumbar spine, were obtained without intravenous contrast. COMPARISON:  CT thoracic spine dated July 21, 2013. CT abdomen pelvis dated July 20, 2004. FINDINGS: MRI CERVICAL SPINE FINDINGS Alignment: Physiologic. Vertebrae: No acute fracture, evidence of discitis, or bone lesion. Old mild T1 superior endplate compression deformity. Cord: Normal signal and morphology.  No intradural enhancement. Posterior Fossa, vertebral arteries, paraspinal tissues: Negative. Disc levels: C2-C3:  Negative. C3-C4:  Negative. C4-C5:  Minimal disc bulge.  No stenosis. C5-C6:  Tiny central disc protrusion.  No stenosis. C6-C7:  Negative. C7-T1:  Negative. MRI THORACIC SPINE FINDINGS Alignment:  Slight focal kyphosis at T12. Vertebrae: No acute fracture, evidence of discitis, or bone lesion. Chronic mild T12 compression deformity status post cement augmentation 2 mm retropulsion of the posterior superior  endplate. Chronic mild superior endplate compression deformities of T1 and T3. Cord:  Normal signal and morphology.  No intradural enhancement. Paraspinal and other soft tissues: Subsegmental atelectasis in both lower lobes. Disc levels: Mild disc degeneration at T6-T7 without significant disc bulge or herniation. Small diffuse disc bulge at T11-T12. No spinal canal or neuroforaminal stenosis at any level. MRI LUMBAR SPINE FINDINGS Segmentation: Assumed standard. The last well-formed disc space is designated L5-S1 for the purposes of this report. Alignment:  Physiologic. Vertebrae: No acute fracture, evidence of discitis, or suspicious bone lesion. L3 hemangioma. Conus medullaris and cauda equina: Conus extends to the L1-L2 level. Conus and cauda equina appear normal. Mild enhancement of the ventral right S1 nerve root. No other abnormal intradural enhancement. Paraspinal and other soft tissues: Negative. Disc levels: T12-L1 to L4-L5:  Negative. L5-S1: Small central disc protrusion with annular fissure. No stenosis. IMPRESSION: Cervical spine: 1. No acute abnormality. 2. Minimal degenerative disc disease at C4-C5 and C5-C6. Thoracic spine: 1. No acute abnormality. 2. Chronic T1, T3, and T12 compression deformities. 3. Mild degenerative disc disease at T6-T7 and T11-T12. Lumbar spine: 1. Mild enhancement of the ventral right S1 nerve root, consistent with radiculitis. No additional abnormal intradural enhancement. 2. Mild degenerative disc disease at L5-S1. Electronically Signed   By: Titus Dubin M.D.   On: 11/16/2018 17:24   Mr Thoracic Spine W Wo Contrast  Result Date: 11/16/2018 CLINICAL DATA:  Ascending lower extremity weakness, numbness, and tingling, now to the level of T5 on neurological exam. Hypo over flecks of lower extremities. Recent viral gastrointestinal illness a few weeks ago. EXAM: MRI CERVICAL, THORACIC AND LUMBAR SPINE WITH AND WITHOUT CONTRAST TECHNIQUE: Multiplanar and multiecho pulse  sequences of the cervical spine, to include the craniocervical junction and cervicothoracic junction, and thoracic and lumbar spine, were obtained without intravenous contrast. COMPARISON:  CT thoracic spine dated July 21, 2013. CT abdomen pelvis dated July 20, 2004. FINDINGS: MRI CERVICAL SPINE FINDINGS Alignment: Physiologic. Vertebrae: No acute fracture, evidence of discitis, or bone lesion. Old mild T1 superior endplate compression deformity. Cord: Normal signal and morphology.  No intradural enhancement. Posterior Fossa, vertebral arteries, paraspinal tissues: Negative. Disc levels: C2-C3:  Negative. C3-C4:  Negative. C4-C5:  Minimal disc bulge.  No stenosis. C5-C6:  Tiny central disc protrusion.  No stenosis. C6-C7:  Negative. C7-T1:  Negative. MRI THORACIC SPINE FINDINGS Alignment:  Slight focal kyphosis at T12. Vertebrae: No acute fracture, evidence of discitis, or bone lesion. Chronic mild T12 compression deformity status post cement  augmentation 2 mm retropulsion of the posterior superior endplate. Chronic mild superior endplate compression deformities of T1 and T3. Cord:  Normal signal and morphology.  No intradural enhancement. Paraspinal and other soft tissues: Subsegmental atelectasis in both lower lobes. Disc levels: Mild disc degeneration at T6-T7 without significant disc bulge or herniation. Small diffuse disc bulge at T11-T12. No spinal canal or neuroforaminal stenosis at any level. MRI LUMBAR SPINE FINDINGS Segmentation: Assumed standard. The last well-formed disc space is designated L5-S1 for the purposes of this report. Alignment:  Physiologic. Vertebrae: No acute fracture, evidence of discitis, or suspicious bone lesion. L3 hemangioma. Conus medullaris and cauda equina: Conus extends to the L1-L2 level. Conus and cauda equina appear normal. Mild enhancement of the ventral right S1 nerve root. No other abnormal intradural enhancement. Paraspinal and other soft tissues: Negative. Disc  levels: T12-L1 to L4-L5:  Negative. L5-S1: Small central disc protrusion with annular fissure. No stenosis. IMPRESSION: Cervical spine: 1. No acute abnormality. 2. Minimal degenerative disc disease at C4-C5 and C5-C6. Thoracic spine: 1. No acute abnormality. 2. Chronic T1, T3, and T12 compression deformities. 3. Mild degenerative disc disease at T6-T7 and T11-T12. Lumbar spine: 1. Mild enhancement of the ventral right S1 nerve root, consistent with radiculitis. No additional abnormal intradural enhancement. 2. Mild degenerative disc disease at L5-S1. Electronically Signed   By: Titus Dubin M.D.   On: 11/16/2018 17:24   Mr Lumbar Spine W Wo Contrast  Result Date: 11/16/2018 CLINICAL DATA:  Ascending lower extremity weakness, numbness, and tingling, now to the level of T5 on neurological exam. Hypo over flecks of lower extremities. Recent viral gastrointestinal illness a few weeks ago. EXAM: MRI CERVICAL, THORACIC AND LUMBAR SPINE WITH AND WITHOUT CONTRAST TECHNIQUE: Multiplanar and multiecho pulse sequences of the cervical spine, to include the craniocervical junction and cervicothoracic junction, and thoracic and lumbar spine, were obtained without intravenous contrast. COMPARISON:  CT thoracic spine dated July 21, 2013. CT abdomen pelvis dated July 20, 2004. FINDINGS: MRI CERVICAL SPINE FINDINGS Alignment: Physiologic. Vertebrae: No acute fracture, evidence of discitis, or bone lesion. Old mild T1 superior endplate compression deformity. Cord: Normal signal and morphology.  No intradural enhancement. Posterior Fossa, vertebral arteries, paraspinal tissues: Negative. Disc levels: C2-C3:  Negative. C3-C4:  Negative. C4-C5:  Minimal disc bulge.  No stenosis. C5-C6:  Tiny central disc protrusion.  No stenosis. C6-C7:  Negative. C7-T1:  Negative. MRI THORACIC SPINE FINDINGS Alignment:  Slight focal kyphosis at T12. Vertebrae: No acute fracture, evidence of discitis, or bone lesion. Chronic mild T12  compression deformity status post cement augmentation 2 mm retropulsion of the posterior superior endplate. Chronic mild superior endplate compression deformities of T1 and T3. Cord:  Normal signal and morphology.  No intradural enhancement. Paraspinal and other soft tissues: Subsegmental atelectasis in both lower lobes. Disc levels: Mild disc degeneration at T6-T7 without significant disc bulge or herniation. Small diffuse disc bulge at T11-T12. No spinal canal or neuroforaminal stenosis at any level. MRI LUMBAR SPINE FINDINGS Segmentation: Assumed standard. The last well-formed disc space is designated L5-S1 for the purposes of this report. Alignment:  Physiologic. Vertebrae: No acute fracture, evidence of discitis, or suspicious bone lesion. L3 hemangioma. Conus medullaris and cauda equina: Conus extends to the L1-L2 level. Conus and cauda equina appear normal. Mild enhancement of the ventral right S1 nerve root. No other abnormal intradural enhancement. Paraspinal and other soft tissues: Negative. Disc levels: T12-L1 to L4-L5:  Negative. L5-S1: Small central disc protrusion with annular fissure. No stenosis.  IMPRESSION: Cervical spine: 1. No acute abnormality. 2. Minimal degenerative disc disease at C4-C5 and C5-C6. Thoracic spine: 1. No acute abnormality. 2. Chronic T1, T3, and T12 compression deformities. 3. Mild degenerative disc disease at T6-T7 and T11-T12. Lumbar spine: 1. Mild enhancement of the ventral right S1 nerve root, consistent with radiculitis. No additional abnormal intradural enhancement. 2. Mild degenerative disc disease at L5-S1. Electronically Signed   By: Titus Dubin M.D.   On: 11/16/2018 17:24        Scheduled Meds: . enoxaparin (LOVENOX) injection  40 mg Subcutaneous Q24H  . Immune Globulin 10%  400 mg/kg Intravenous Q24 Hr x 5  . pantoprazole  40 mg Oral Daily  . potassium chloride  40 mEq Oral Once   Continuous Infusions:   LOS: 1 day     Cordelia Poche, MD [facility] 11/17/2018, 8:55 AM  If 7PM-7AM, please contact night-coverage [email or web address]

## 2018-11-18 LAB — IGA: IgA: 237 mg/dL (ref 87–352)

## 2018-11-18 MED ORDER — HYDROCODONE-ACETAMINOPHEN 5-325 MG PO TABS
1.0000 | ORAL_TABLET | Freq: Four times a day (QID) | ORAL | Status: DC | PRN
  Administered 2018-11-18 – 2018-11-19 (×3): 1 via ORAL
  Filled 2018-11-18 (×3): qty 1

## 2018-11-18 NOTE — Progress Notes (Signed)
PROGRESS NOTE    Kimberly [name]  CNO:[id number] DOB: [birth year] DOA: 11/16/2018 PCP: [name], MD   Brief Narrative: Kimberly [name] is a 33 y.o. female with medical history significant of anxiety, ADHD, hypokalemia. She presented with ascending numbness, lower extremity weakness and ataxia. Concern for Guillain-Barre vs nutritional deficiency. IVIG started 1/11.   Assessment & Plan:   Principal Problem:   Weakness of both lower extremities Active Problems:   Hypokalemia   Anxiety   Bilateral leg weakness Concern for Guillain-Barre vs nutritional deficiency per neurology. Started IVIG today -Neuro recommendations: multiple labs sent; IVIG  Hemorrhoids Patient will need to follow-up outpatient -Lidocaine cream  Hypokalemia Chronic. Given supplementation.  UTI Present on admission. Nitrofurantoin discontinued on discharge.   DVT prophylaxis: Lovenox Code Status:   Code Status: Full Code Family Communication: Aunt at bedside Disposition Plan: Discharge pending neurology management   Consultants:   Neurology  Procedures:   IVIG (1/11>>  Antimicrobials:  None    Subjective: Continued numbness.  Objective: Vitals:   11/18/18 0910 11/18/18 0925 11/18/18 0940 11/18/18 1200  BP: 112/74 121/78 123/90 (!) 130/92  Pulse: 66 71 66 60  Resp: 18 18 18 18   Temp: 98.4 F (36.9 C) 98.1 F (36.7 C) 97.7 F (36.5 C) 98.4 F (36.9 C)  TempSrc: Oral Oral Oral Oral  SpO2:      Weight:      Height:       No intake or output data in the 24 hours ending 11/18/18 1557 Filed Weights   11/16/18 2020  Weight: 68.9 kg    Examination:  General exam: Appears calm and comfortable Central nervous system: Alert and oriented. Moves all extremities    Data Reviewed: I have personally reviewed following labs and imaging studies  CBC: Recent Labs  Lab 11/16/18 1025  WBC 8.5  NEUTROABS 6.2  HGB 15.7*  HCT 47.4*  MCV 103.9*  PLT 294   Basic Metabolic  Panel: Recent Labs  Lab 11/16/18 1025 11/16/18 1345 11/17/18 0431  NA 137  --  142  K 2.4*  --  3.2*  CL 99  --  107  CO2 25  --  25  GLUCOSE 130*  --  87  BUN 6  --  7  CREATININE 0.58  --  0.55  CALCIUM 9.5  --  8.6*  MG  --  1.9 1.9  PHOS  --   --  3.7   GFR: Estimated Creatinine Clearance: 96.4 mL/min (by C-G formula based on SCr of 0.55 mg/dL). Liver Function Tests: Recent Labs  Lab 11/16/18 1025  AST 48*  ALT 37  ALKPHOS 64  BILITOT 1.1  PROT 6.7  ALBUMIN 3.7   No results for input(s): LIPASE, AMYLASE in the last 168 hours. No results for input(s): AMMONIA in the last 168 hours. Coagulation Profile: No results for input(s): INR, PROTIME in the last 168 hours. Cardiac Enzymes: No results for input(s): CKTOTAL, CKMB, CKMBINDEX, TROPONINI in the last 168 hours. BNP (last 3 results) No results for input(s): PROBNP in the last 8760 hours. HbA1C: Recent Labs    11/16/18 1518  HGBA1C 4.9   CBG: No results for input(s): GLUCAP in the last 168 hours. Lipid Profile: No results for input(s): CHOL, HDL, LDLCALC, TRIG, CHOLHDL, LDLDIRECT in the last 72 hours. Thyroid Function Tests: Recent Labs    11/16/18 1518  TSH 3.186   Anemia Panel: Recent Labs    11/16/18 1517  VITAMINB12 802   Sepsis Labs:  Recent Labs  Lab 11/16/18 1118  LATICACIDVEN 2.85*    Recent Results (from the past 240 hour(s))  Urine culture     Status: Abnormal   Collection Time: 11/11/18  1:37 PM  Result Value Ref Range Status   Specimen Description URINE, RANDOM  Final   Special Requests   Final    NONE Performed at [facility], [address] [place], [place] [address]    Culture >=100,000 COLONIES/mL ESCHERICHIA COLI (A)  Final   Report Status 11/13/2018 FINAL  Final   Organism ID, Bacteria ESCHERICHIA COLI (A)  Final      Susceptibility   Escherichia coli - MIC*    AMPICILLIN <=2 SENSITIVE Sensitive     CEFAZOLIN <=4 SENSITIVE Sensitive     CEFTRIAXONE <=1  SENSITIVE Sensitive     CIPROFLOXACIN <=0.25 SENSITIVE Sensitive     GENTAMICIN <=1 SENSITIVE Sensitive     IMIPENEM <=0.25 SENSITIVE Sensitive     NITROFURANTOIN <=16 SENSITIVE Sensitive     TRIMETH/SULFA <=20 SENSITIVE Sensitive     AMPICILLIN/SULBACTAM <=2 SENSITIVE Sensitive     PIP/TAZO <=4 SENSITIVE Sensitive     Extended ESBL NEGATIVE Sensitive     * >=100,000 COLONIES/mL ESCHERICHIA COLI  CSF culture     Status: None (Preliminary result)   Collection Time: 11/16/18 10:37 AM  Result Value Ref Range Status   Specimen Description CSF  Final   Special Requests NONE  Final   Gram Stain CYTOSPIN SMEAR NO WBC SEEN NO ORGANISMS SEEN   Final   Culture   Final    NO GROWTH 2 DAYS Performed at [facility] [address], [place], [place] [address]    Report Status PENDING  Incomplete         Radiology Studies: Mr Cervical Spine W Or Wo Contrast  Result Date: 11/16/2018 CLINICAL DATA:  Ascending lower extremity weakness, numbness, and tingling, now to the level of T5 on neurological exam. Hypo over flecks of lower extremities. Recent viral gastrointestinal illness a few weeks ago. EXAM: MRI CERVICAL, THORACIC AND LUMBAR SPINE WITH AND WITHOUT CONTRAST TECHNIQUE: Multiplanar and multiecho pulse sequences of the cervical spine, to include the craniocervical junction and cervicothoracic junction, and thoracic and lumbar spine, were obtained without intravenous contrast. COMPARISON:  CT thoracic spine dated July 21, 2013. CT abdomen pelvis dated July 20, 2004. FINDINGS: MRI CERVICAL SPINE FINDINGS Alignment: Physiologic. Vertebrae: No acute fracture, evidence of discitis, or bone lesion. Old mild T1 superior endplate compression deformity. Cord: Normal signal and morphology.  No intradural enhancement. Posterior Fossa, vertebral arteries, paraspinal tissues: Negative. Disc levels: C2-C3:  Negative. C3-C4:  Negative. C4-C5:  Minimal disc bulge.  No stenosis. C5-C6:  Tiny  central disc protrusion.  No stenosis. C6-C7:  Negative. C7-T1:  Negative. MRI THORACIC SPINE FINDINGS Alignment:  Slight focal kyphosis at T12. Vertebrae: No acute fracture, evidence of discitis, or bone lesion. Chronic mild T12 compression deformity status post cement augmentation 2 mm retropulsion of the posterior superior endplate. Chronic mild superior endplate compression deformities of T1 and T3. Cord:  Normal signal and morphology.  No intradural enhancement. Paraspinal and other soft tissues: Subsegmental atelectasis in both lower lobes. Disc levels: Mild disc degeneration at T6-T7 without significant disc bulge or herniation. Small diffuse disc bulge at T11-T12. No spinal canal or neuroforaminal stenosis at any level. MRI LUMBAR SPINE FINDINGS Segmentation: Assumed standard. The last well-formed disc space is designated L5-S1 for the purposes of this report. Alignment:  Physiologic. Vertebrae: No acute fracture, evidence of discitis, or suspicious bone lesion. L3 hemangioma. Conus medullaris and cauda equina: Conus extends to the L1-L2 level. Conus and cauda equina appear normal. Mild enhancement of the ventral right S1 nerve root. No other abnormal intradural enhancement. Paraspinal and other soft tissues: Negative. Disc levels: T12-L1 to L4-L5:  Negative. L5-S1: Small central disc protrusion with annular fissure. No stenosis. IMPRESSION: Cervical spine: 1. No acute abnormality. 2. Minimal degenerative disc disease at C4-C5 and C5-C6. Thoracic spine: 1. No acute abnormality. 2. Chronic T1, T3, and T12 compression deformities. 3. Mild degenerative disc disease at T6-T7 and T11-T12. Lumbar spine: 1. Mild enhancement of the ventral right S1 nerve root, consistent with radiculitis. No additional abnormal intradural enhancement. 2. Mild degenerative disc disease at L5-S1. Electronically Signed   By: Titus Dubin M.D.   On: 11/16/2018 17:24   Mr Thoracic Spine W Wo Contrast  Result Date:  11/16/2018 CLINICAL DATA:  Ascending lower extremity weakness, numbness, and tingling, now to the level of T5 on neurological exam. Hypo over flecks of lower extremities. Recent viral gastrointestinal illness a few weeks ago. EXAM: MRI CERVICAL, THORACIC AND LUMBAR SPINE WITH AND WITHOUT CONTRAST TECHNIQUE: Multiplanar and multiecho pulse sequences of the cervical spine, to include the craniocervical junction and cervicothoracic junction, and thoracic and lumbar spine, were obtained without intravenous contrast. COMPARISON:  CT thoracic spine dated July 21, 2013. CT abdomen pelvis dated July 20, 2004. FINDINGS: MRI CERVICAL SPINE FINDINGS Alignment: Physiologic. Vertebrae: No acute fracture, evidence of discitis, or bone lesion. Old mild T1 superior endplate compression deformity. Cord: Normal signal and morphology.  No intradural enhancement. Posterior Fossa, vertebral arteries, paraspinal tissues: Negative. Disc levels: C2-C3:  Negative. C3-C4:  Negative. C4-C5:  Minimal disc bulge.  No stenosis. C5-C6:  Tiny central disc protrusion.  No stenosis. C6-C7:  Negative. C7-T1:  Negative. MRI THORACIC SPINE FINDINGS Alignment:  Slight focal kyphosis at T12. Vertebrae: No acute fracture, evidence of discitis, or bone lesion. Chronic mild T12 compression deformity status post cement augmentation 2 mm retropulsion of the posterior superior endplate. Chronic mild superior endplate compression deformities of T1 and T3. Cord:  Normal signal and morphology.  No intradural enhancement. Paraspinal and other soft tissues: Subsegmental atelectasis in both lower lobes. Disc levels: Mild disc degeneration at T6-T7 without significant disc bulge or herniation. Small diffuse disc bulge at T11-T12. No spinal canal or neuroforaminal stenosis at any level. MRI LUMBAR SPINE FINDINGS Segmentation: Assumed standard. The last well-formed disc space is designated L5-S1 for the purposes of this report. Alignment:  Physiologic.  Vertebrae: No acute fracture, evidence of discitis, or suspicious bone lesion. L3 hemangioma. Conus medullaris and cauda equina: Conus extends to the L1-L2 level. Conus and cauda equina appear normal. Mild enhancement of the ventral right S1 nerve root. No other abnormal intradural enhancement. Paraspinal and other soft tissues: Negative. Disc levels: T12-L1 to L4-L5:  Negative. L5-S1: Small central disc protrusion with annular fissure. No stenosis. IMPRESSION: Cervical spine: 1. No acute abnormality. 2. Minimal degenerative disc disease at C4-C5 and C5-C6. Thoracic spine: 1. No acute abnormality. 2. Chronic T1, T3, and T12 compression deformities. 3. Mild degenerative disc disease at T6-T7 and T11-T12. Lumbar spine: 1. Mild enhancement of the ventral right S1 nerve root, consistent with radiculitis. No additional abnormal intradural enhancement. 2. Mild degenerative disc disease at L5-S1. Electronically Signed   By: Titus Dubin M.D.   On: 11/16/2018 17:24   Mr Lumbar Spine W Wo Contrast  Result Date:  11/16/2018 CLINICAL DATA:  Ascending lower extremity weakness, numbness, and tingling, now to the level of T5 on neurological exam. Hypo over flecks of lower extremities. Recent viral gastrointestinal illness a few weeks ago. EXAM: MRI CERVICAL, THORACIC AND LUMBAR SPINE WITH AND WITHOUT CONTRAST TECHNIQUE: Multiplanar and multiecho pulse sequences of the cervical spine, to include the craniocervical junction and cervicothoracic junction, and thoracic and lumbar spine, were obtained without intravenous contrast. COMPARISON:  CT thoracic spine dated July 21, 2013. CT abdomen pelvis dated July 20, 2004. FINDINGS: MRI CERVICAL SPINE FINDINGS Alignment: Physiologic. Vertebrae: No acute fracture, evidence of discitis, or bone lesion. Old mild T1 superior endplate compression deformity. Cord: Normal signal and morphology.  No intradural enhancement. Posterior Fossa, vertebral arteries, paraspinal tissues:  Negative. Disc levels: C2-C3:  Negative. C3-C4:  Negative. C4-C5:  Minimal disc bulge.  No stenosis. C5-C6:  Tiny central disc protrusion.  No stenosis. C6-C7:  Negative. C7-T1:  Negative. MRI THORACIC SPINE FINDINGS Alignment:  Slight focal kyphosis at T12. Vertebrae: No acute fracture, evidence of discitis, or bone lesion. Chronic mild T12 compression deformity status post cement augmentation 2 mm retropulsion of the posterior superior endplate. Chronic mild superior endplate compression deformities of T1 and T3. Cord:  Normal signal and morphology.  No intradural enhancement. Paraspinal and other soft tissues: Subsegmental atelectasis in both lower lobes. Disc levels: Mild disc degeneration at T6-T7 without significant disc bulge or herniation. Small diffuse disc bulge at T11-T12. No spinal canal or neuroforaminal stenosis at any level. MRI LUMBAR SPINE FINDINGS Segmentation: Assumed standard. The last well-formed disc space is designated L5-S1 for the purposes of this report. Alignment:  Physiologic. Vertebrae: No acute fracture, evidence of discitis, or suspicious bone lesion. L3 hemangioma. Conus medullaris and cauda equina: Conus extends to the L1-L2 level. Conus and cauda equina appear normal. Mild enhancement of the ventral right S1 nerve root. No other abnormal intradural enhancement. Paraspinal and other soft tissues: Negative. Disc levels: T12-L1 to L4-L5:  Negative. L5-S1: Small central disc protrusion with annular fissure. No stenosis. IMPRESSION: Cervical spine: 1. No acute abnormality. 2. Minimal degenerative disc disease at C4-C5 and C5-C6. Thoracic spine: 1. No acute abnormality. 2. Chronic T1, T3, and T12 compression deformities. 3. Mild degenerative disc disease at T6-T7 and T11-T12. Lumbar spine: 1. Mild enhancement of the ventral right S1 nerve root, consistent with radiculitis. No additional abnormal intradural enhancement. 2. Mild degenerative disc disease at L5-S1. Electronically Signed    By: Titus Dubin M.D.   On: 11/16/2018 17:24        Scheduled Meds: . enoxaparin (LOVENOX) injection  40 mg Subcutaneous Q24H  . feeding supplement (ENSURE ENLIVE)  237 mL Oral BID BM  . Immune Globulin 10%  400 mg/kg Intravenous Q24 Hr x 5  . pantoprazole  40 mg Oral Daily   Continuous Infusions:   LOS: 2 days     Cordelia Poche, MD [facility] 11/18/2018, 3:57 PM  If 7PM-7AM, please contact night-coverage [email or web address]

## 2018-11-18 NOTE — Plan of Care (Signed)
Patient stable, discussed POC with patient, agreeable with plan, PRN pain medication tolerated well, denies question/concerns at this time.

## 2018-11-18 NOTE — Progress Notes (Signed)
NEUROLOGY PROGRESS NOTE  Subjective: Ascending LE loss of sensation and weakness,  paraesthesia  CC-"can't fel my feet"   PMH- IBS, remote alcohol abuse, tobacco abuse, LE edema, s/p diuresis (PCP 12/31) Sequelae, subsequent hypokalemia (ER 11/11/17), diarrhea, UTI,  hypovolemia and dehydration   -Presented to ER/ sent from general neurology seen by Dr. Jaynee Eagles on 11/16/2018 ascending weakness, and paraesthesia w/ hyporeflexia, s/p viral illness. Raise high suspicion for Guillian Barre syndrome   She has completed 1 day of IVIG. She states that she feels somewhat worse than she did before starting. We discussed that this is not uncommon in the 1st 24-48 hours. We will continue to monitor. The good thing as that she has not had any concerning reactions. She may been to be treated with tylenol or slow the rate of the infusion moving forward. She remains with hyporeflexia but overall is doing ok without change.  Exam: Vitals:   11/18/18 0925 11/18/18 0940  BP: 121/78 123/90  Pulse: 71 66  Resp: 18 18  Temp: 98.1 F (36.7 C) 97.7 F (36.5 C)  SpO2:     General ROS: negative for - chills, fatigue, fever, night sweats, weight gain or weight loss, mild discomfort  Psychological ROS: negative for - behavioral disorder, hallucinations, memory difficulties, mood swings or suicidal ideation Ophthalmic ROS: negative for - blurry vision, double vision,ENT ROS: negative for - epistaxis, nasal discharge, oral lesions, sore throat, tinnitus or vertigo Allergy and Immunology ROS: negative for - hives or itchy/watery eyes Hematological and Lymphatic ROS: negative for - bleeding problems, bruising or swollen lymph nodes Endocrine ROS: negative for - galactorrhea, hair pattern changes, polydipsia/polyuria or temperature intolerance Respiratory ROS: negative for - cough, hemoptysis, shortness of breath or wheezing Cardiovascular ROS: negative for - chest pain, dyspnea on exertion, edema or irregular  heartbeat Gastrointestinal ROS:  negative for - abdominal pain, hematemesis, nausea/vomiting or stool incontinence Genito-Urinary ROS: negative for - dysuria, hematuria, incontinence positive for urinary urgency "feels different." Notes a recent UTI  Musculoskeletal ROS: negative for - joint swelling , positive for muscle weakness  Neurological ROS: hyporeflexia, BLE weakness,  lower paraesthesia remains now ascending to arms, sensory decline on right side of face- every where else normal on exam  Dermatological ROS: negative for rash and skin lesion changes Physical Exam   HEENT-  Normocephalic, no lesions, without obvious abnormality.  Delayed upward gaze and discomfort >3 secs    Cardiovascular- S1-S2 audible, pulses palpable throughout   Lungs-no rhonchi or wheezing noted, no excessive working breathing.  Saturations within normal limits- Abdomen- All 4 quadrants palpated and nontender Extremities- Warm, dry and intact Musculoskeletal-no joint tenderness, deformity or swelling Skin-warm and dry, no hyperpigmentation, vitiligo, or suspicious lesions  Neuro:  General - min distress Heart - Regular rate and rhythm - no murmer Lungs - Clear to auscultation Abdomen - Soft - non tender Extremities - Distal pulses intact - no edema Skin - Warm and dry  Neurologic Examination:   Mental Status:  Alert, oriented,x 3. Speech without evidence of dysarthria or aphasia. Able to follow 3 step commands without difficulty.  Cranial Nerves:  II-bilateral visual fields intact III/IV/VI-Pupils were equal and reacted +3. Upward delay in upward gaze >3 secs  V/VII-no facial numbness and no facial weakness  VIII-hearing normal.  X-normal speech and symmetrical palatal movement.  XII-midline tongue extension  Motor: Right :  Upper extremity   4-/5  Left:     Upper extremity   4-/5             Lower extremity   4-/5                                                   Lower extremity   4-/5 Tone and bulk: decreased  LE tone; no atrophy noted Sensory: decreased in bilat LE up to torso ~T5- now including upper ext weakness, numbness and tingling  Deep Tendon Reflexes: areflexive in bilat LEs, UE are 2/4 Plantars: mute Cerebellar: RUE FTN dysmetria, normal on left Gait: not tested   Medications:  I have reviewed the patient's current medications. Scheduled: . enoxaparin (LOVENOX) injection  40 mg Subcutaneous Q24H  . feeding supplement (ENSURE ENLIVE)  237 mL Oral BID BM  . Immune Globulin 10%  400 mg/kg Intravenous Q24 Hr x 5  . pantoprazole  40 mg Oral Daily   Continuous:  QQP:[id number] **OR** acetaminophen, ibuprofen, lidocaine, loperamide, ondansetron (ZOFRAN) IV, ondansetron  Pertinent Labs/Diagnostics IgA- pending, GQ1B pending send off could take about 3 days  Sedrate- normal- 1 CBC- WBC without leukocytosis 8.5 , Hbg 15.7, MCV 103.9/ MCH 34.4- Anemia,  B12 normal 802, Folate pending, B1 pending  TSH- 3.18, copper 68  LP- RBC 253/135,000, Traumatic tap likely, Protein 16, WBC 1/51- Glucose 74- normal overall  Ct Head Wo Contrast  Result Date: 11/16/2018 CLINICAL DATA:  Left-sided numbness and tingling EXAM: CT HEAD WITHOUT CONTRAST TECHNIQUE: Contiguous axial images were obtained from the base of the skull through the vertex without intravenous contrast. COMPARISON:  September 04, 2007 FINDINGS: Brain: Ventricles are normal in size and configuration. There is no intracranial mass, hemorrhage, extra-axial fluid collection, or midline shift. Brain parenchyma appears unremarkable. No acute infarct evident. Vascular: There is no evident hyperdense vessel. There is minimal calcification in each carotid siphon region. Skull: The bony calvarium appears intact. Sinuses/Orbits: Visualized paranasal sinuses are clear. Visualized orbits appear symmetric bilaterally. Other: Mastoid air cells are clear. IMPRESSION: Minimal arterial vascular  calcification. Study otherwise unremarkable. Electronically Signed   By: Lowella Grip III M.D.   On: 11/16/2018 11:46   Mr Cervical Spine W Or Wo Contrast  Result Date: 11/16/2018 CLINICAL DATA:  Ascending lower extremity weakness, numbness, and tingling, now to the level of T5 on neurological exam. Hypo over flecks of lower extremities. Recent viral gastrointestinal illness a few weeks ago. EXAM: MRI CERVICAL, THORACIC AND LUMBAR SPINE WITH AND WITHOUT CONTRAST TECHNIQUE: Multiplanar and multiecho pulse sequences of the cervical spine, to include the craniocervical junction and cervicothoracic junction, and thoracic and lumbar spine, were obtained without intravenous contrast. COMPARISON:  CT thoracic spine dated July 21, 2013. CT abdomen pelvis dated July 20, 2004. FINDINGS: MRI CERVICAL SPINE FINDINGS Alignment: Physiologic. Vertebrae: No acute fracture, evidence of discitis, or bone lesion. Old mild T1 superior endplate compression deformity. Cord: Normal signal and morphology.  No intradural enhancement. Posterior Fossa, vertebral arteries, paraspinal tissues: Negative. Disc levels: C2-C3:  Negative. C3-C4:  Negative. C4-C5:  Minimal disc bulge.  No stenosis. C5-C6:  Tiny central disc protrusion.  No stenosis. C6-C7:  Negative. C7-T1:  Negative. MRI THORACIC SPINE FINDINGS Alignment:  Slight focal kyphosis at T12. Vertebrae: No acute fracture, evidence of discitis, or bone lesion. Chronic mild T12 compression deformity status post cement augmentation 2 mm retropulsion  of the posterior superior endplate. Chronic mild superior endplate compression deformities of T1 and T3. Cord:  Normal signal and morphology.  No intradural enhancement. Paraspinal and other soft tissues: Subsegmental atelectasis in both lower lobes. Disc levels: Mild disc degeneration at T6-T7 without significant disc bulge or herniation. Small diffuse disc bulge at T11-T12. No spinal canal or neuroforaminal stenosis at any  level. MRI LUMBAR SPINE FINDINGS Segmentation: Assumed standard. The last well-formed disc space is designated L5-S1 for the purposes of this report. Alignment:  Physiologic. Vertebrae: No acute fracture, evidence of discitis, or suspicious bone lesion. L3 hemangioma. Conus medullaris and cauda equina: Conus extends to the L1-L2 level. Conus and cauda equina appear normal. Mild enhancement of the ventral right S1 nerve root. No other abnormal intradural enhancement. Paraspinal and other soft tissues: Negative. Disc levels: T12-L1 to L4-L5:  Negative. L5-S1: Small central disc protrusion with annular fissure. No stenosis. IMPRESSION: Cervical spine: 1. No acute abnormality. 2. Minimal degenerative disc disease at C4-C5 and C5-C6. Thoracic spine: 1. No acute abnormality. 2. Chronic T1, T3, and T12 compression deformities. 3. Mild degenerative disc disease at T6-T7 and T11-T12. Lumbar spine: 1. Mild enhancement of the ventral right S1 nerve root, consistent with radiculitis. No additional abnormal intradural enhancement. 2. Mild degenerative disc disease at L5-S1. Electronically Signed   By: Titus Dubin M.D.   On: 11/16/2018 17:24   Mr Thoracic Spine W Wo Contrast  Result Date: 11/16/2018 CLINICAL DATA:  Ascending lower extremity weakness, numbness, and tingling, now to the level of T5 on neurological exam. Hypo over flecks of lower extremities. Recent viral gastrointestinal illness a few weeks ago. EXAM: MRI CERVICAL, THORACIC AND LUMBAR SPINE WITH AND WITHOUT CONTRAST TECHNIQUE: Multiplanar and multiecho pulse sequences of the cervical spine, to include the craniocervical junction and cervicothoracic junction, and thoracic and lumbar spine, were obtained without intravenous contrast. COMPARISON:  CT thoracic spine dated July 21, 2013. CT abdomen pelvis dated July 20, 2004. FINDINGS: MRI CERVICAL SPINE FINDINGS Alignment: Physiologic. Vertebrae: No acute fracture, evidence of discitis, or bone  lesion. Old mild T1 superior endplate compression deformity. Cord: Normal signal and morphology.  No intradural enhancement. Posterior Fossa, vertebral arteries, paraspinal tissues: Negative. Disc levels: C2-C3:  Negative. C3-C4:  Negative. C4-C5:  Minimal disc bulge.  No stenosis. C5-C6:  Tiny central disc protrusion.  No stenosis. C6-C7:  Negative. C7-T1:  Negative. MRI THORACIC SPINE FINDINGS Alignment:  Slight focal kyphosis at T12. Vertebrae: No acute fracture, evidence of discitis, or bone lesion. Chronic mild T12 compression deformity status post cement augmentation 2 mm retropulsion of the posterior superior endplate. Chronic mild superior endplate compression deformities of T1 and T3. Cord:  Normal signal and morphology.  No intradural enhancement. Paraspinal and other soft tissues: Subsegmental atelectasis in both lower lobes. Disc levels: Mild disc degeneration at T6-T7 without significant disc bulge or herniation. Small diffuse disc bulge at T11-T12. No spinal canal or neuroforaminal stenosis at any level. MRI LUMBAR SPINE FINDINGS Segmentation: Assumed standard. The last well-formed disc space is designated L5-S1 for the purposes of this report. Alignment:  Physiologic. Vertebrae: No acute fracture, evidence of discitis, or suspicious bone lesion. L3 hemangioma. Conus medullaris and cauda equina: Conus extends to the L1-L2 level. Conus and cauda equina appear normal. Mild enhancement of the ventral right S1 nerve root. No other abnormal intradural enhancement. Paraspinal and other soft tissues: Negative. Disc levels: T12-L1 to L4-L5:  Negative. L5-S1: Small central disc protrusion with annular fissure. No stenosis. IMPRESSION: Cervical spine: 1.  No acute abnormality. 2. Minimal degenerative disc disease at C4-C5 and C5-C6. Thoracic spine: 1. No acute abnormality. 2. Chronic T1, T3, and T12 compression deformities. 3. Mild degenerative disc disease at T6-T7 and T11-T12. Lumbar spine: 1. Mild enhancement  of the ventral right S1 nerve root, consistent with radiculitis. No additional abnormal intradural enhancement. 2. Mild degenerative disc disease at L5-S1. Electronically Signed   By: Titus Dubin M.D.   On: 11/16/2018 17:24   Mr Lumbar Spine W Wo Contrast  Result Date: 11/16/2018 CLINICAL DATA:  Ascending lower extremity weakness, numbness, and tingling, now to the level of T5 on neurological exam. Hypo over flecks of lower extremities. Recent viral gastrointestinal illness a few weeks ago. EXAM: MRI CERVICAL, THORACIC AND LUMBAR SPINE WITH AND WITHOUT CONTRAST TECHNIQUE: Multiplanar and multiecho pulse sequences of the cervical spine, to include the craniocervical junction and cervicothoracic junction, and thoracic and lumbar spine, were obtained without intravenous contrast. COMPARISON:  CT thoracic spine dated July 21, 2013. CT abdomen pelvis dated July 20, 2004. FINDINGS: MRI CERVICAL SPINE FINDINGS Alignment: Physiologic. Vertebrae: No acute fracture, evidence of discitis, or bone lesion. Old mild T1 superior endplate compression deformity. Cord: Normal signal and morphology.  No intradural enhancement. Posterior Fossa, vertebral arteries, paraspinal tissues: Negative. Disc levels: C2-C3:  Negative. C3-C4:  Negative. C4-C5:  Minimal disc bulge.  No stenosis. C5-C6:  Tiny central disc protrusion.  No stenosis. C6-C7:  Negative. C7-T1:  Negative. MRI THORACIC SPINE FINDINGS Alignment:  Slight focal kyphosis at T12. Vertebrae: No acute fracture, evidence of discitis, or bone lesion. Chronic mild T12 compression deformity status post cement augmentation 2 mm retropulsion of the posterior superior endplate. Chronic mild superior endplate compression deformities of T1 and T3. Cord:  Normal signal and morphology.  No intradural enhancement. Paraspinal and other soft tissues: Subsegmental atelectasis in both lower lobes. Disc levels: Mild disc degeneration at T6-T7 without significant disc bulge or  herniation. Small diffuse disc bulge at T11-T12. No spinal canal or neuroforaminal stenosis at any level. MRI LUMBAR SPINE FINDINGS Segmentation: Assumed standard. The last well-formed disc space is designated L5-S1 for the purposes of this report. Alignment:  Physiologic. Vertebrae: No acute fracture, evidence of discitis, or suspicious bone lesion. L3 hemangioma. Conus medullaris and cauda equina: Conus extends to the L1-L2 level. Conus and cauda equina appear normal. Mild enhancement of the ventral right S1 nerve root. No other abnormal intradural enhancement. Paraspinal and other soft tissues: Negative. Disc levels: T12-L1 to L4-L5:  Negative. L5-S1: Small central disc protrusion with annular fissure. No stenosis. IMPRESSION: Cervical spine: 1. No acute abnormality. 2. Minimal degenerative disc disease at C4-C5 and C5-C6. Thoracic spine: 1. No acute abnormality. 2. Chronic T1, T3, and T12 compression deformities. 3. Mild degenerative disc disease at T6-T7 and T11-T12. Lumbar spine: 1. Mild enhancement of the ventral right S1 nerve root, consistent with radiculitis. No additional abnormal intradural enhancement. 2. Mild degenerative disc disease at L5-S1. Electronically Signed   By: Titus Dubin M.D.   On: 11/16/2018 17:24   Assessment: 33 year old female with past medical history of alcohol abuse presents to the emergency room slightly greater than 1 week history of bilateral lower extreme numbness gradually extending upwards to normal upper extremities.  He also had   viral GI illness in December.  She also has weakness of both lower extremities as well as  ataxia on examination..  Areflexic in both lower extremities,decreased in upper extremities. Now with paraesthesia and weakness of arms, and upwards gaze delay and  discomfort >3 secs. No idincation of resp distress present   Impression: GB/ ADD versus nutritional deficiency causing polyneuropathy History and physical exam is very concerning for  acute inflammatory demyelinating polyradiculoneuropathy Mardelle Matte).  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   Recommendations: 1) Emperical tx of IVIG -> GB x 5 days has already completed 1/5 days  2) lab remain pending, B12 normal, B1, folate,Gq1b- antibodies  started cyanocobalamin or gel tube 3) Monitor for signs and symptoms post infusion reaction, swelling, fever, SOB, stop infusion and report to neurology immediately   C/o feeling worse after infusion - not an abnormal complaint post IVIG May possibly benefit from pre treatment of Tylenol, decreasing infusion rate, fluids pior or steroids. Will depend on attending if any further interventions are needed  Lilyan Gilford Neuro hospitalist NP   11/18/2018, 11:14 AM  NEUROHOSPITALIST ADDENDUM Performed a face to face diagnostic evaluation.   I have reviewed the contents of history and physical exam as documented by PA/ARNP/Resident and agree with above documentation.  I have discussed and formulated the above plan as documented. Edits to the note have been made as needed.  Patient states she feels slightly worse today. Exam : she has about the same strength as yesterday.  Received IVIG yesterday, tolerated with no complications.  Will continue to follow.     Karena Addison Aroor MD [facility] [phone]   If 7pm to 7am, please call on call as listed on AMION.

## 2018-11-19 ENCOUNTER — Encounter: Payer: Self-pay | Admitting: Neurology

## 2018-11-19 ENCOUNTER — Inpatient Hospital Stay (HOSPITAL_COMMUNITY): Payer: BLUE CROSS/BLUE SHIELD

## 2018-11-19 ENCOUNTER — Other Ambulatory Visit: Payer: Self-pay

## 2018-11-19 DIAGNOSIS — G610 Guillain-Barre syndrome: Secondary | ICD-10-CM | POA: Insufficient documentation

## 2018-11-19 LAB — FOLATE RBC
Folate, Hemolysate: 204 ng/mL
Folate, Hemolysate: 227 ng/mL
Folate, RBC: 507 ng/mL (ref >498)
Folate, RBC: 575 ng/mL (ref >498)
Hematocrit: 40.2 % (ref 34.0–46.6)
Hematocrit: 39.5 % (ref 34.0–46.6)

## 2018-11-19 LAB — C-REACTIVE PROTEIN: CRP: <0.8 mg/dL (ref <1.0)

## 2018-11-19 LAB — BASIC METABOLIC PANEL
Anion gap: 6 (ref 5–15)
BUN: 10 mg/dL (ref 6–20)
CO2: 24 mmol/L (ref 22–32)
Calcium: 8.6 mg/dL — ABNORMAL LOW (ref 8.9–10.3)
Chloride: 106 mmol/L (ref 98–111)
Creatinine, Ser: 0.52 mg/dL (ref 0.44–1.00)
GFR calc Af Amer: >60 mL/min (ref >60)
GFR calc non Af Amer: >60 mL/min (ref >60)
Glucose, Bld: 91 mg/dL (ref 70–99)
POTASSIUM: 4.4 mmol/L (ref 3.5–5.1)
Sodium: 136 mmol/L (ref 135–145)

## 2018-11-19 LAB — PATHOLOGIST SMEAR REVIEW

## 2018-11-19 LAB — CSF CULTURE W GRAM STAIN: Culture: NO GROWTH

## 2018-11-19 LAB — SEDIMENTATION RATE: Sed Rate: 14 mm/hr (ref 0–22)

## 2018-11-19 LAB — TROPONIN I

## 2018-11-19 MED ORDER — MORPHINE SULFATE (PF) 2 MG/ML IV SOLN: 2.0000 mg | INTRAVENOUS | Status: DC | PRN

## 2018-11-19 MED ORDER — HYDROCODONE-ACETAMINOPHEN 5-325 MG PO TABS
1.0000 | ORAL_TABLET | Freq: Four times a day (QID) | ORAL | Status: DC | PRN
  Administered 2018-11-19 – 2018-11-21 (×7): 2 via ORAL
  Filled 2018-11-19 (×7): qty 2

## 2018-11-19 NOTE — Progress Notes (Signed)
Occupational Therapy Treatment Patient Details Name: Kimberly [name] MRN: [id number] DOB: [birth year] Today's Date: 11/19/2018    History of present illness Pt is a 33 y/o female admitted secondary to bilateral LE weakness. 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 PMH including but not limited to alcohol abuse, tobacco abuse and ADD.   OT comments  Pt making slow progress with adls. Pt overall requires min guard assist with adls that are in standing. Pt with LLE weakness greater than RLE. Pt feels like L leg buckles when she stands which makes her unsafe to get up alone.  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  Increased complaints of pain in back and shoulders.  Will continue to follow.   Follow Up Recommendations  Home health OT    Equipment Recommendations  3 in 1 bedside commode    Recommendations for Other Services      Precautions / Restrictions Precautions Precautions: None Restrictions Weight Bearing Restrictions: No Other Position/Activity Restrictions: LUE/LLE significantly weaker than R side.       Mobility Bed Mobility Overal bed mobility: Modified Independent             General bed mobility comments: no physical assist needed.  Transfers Overall transfer level: Needs assistance Equipment used: Rolling walker (2 wheeled) Transfers: Sit to/from [facility] Sit to Stand: Min guard Stand pivot transfers: Min guard       General transfer comment: min guard for safety; increased use of UE to obtain stanidng position; noted genu recurvatum with initial standing posture    Balance Overall balance assessment: Needs assistance Sitting-balance support: No upper extremity supported;Feet supported Sitting balance-Leahy Scale: Good     Standing balance support: Bilateral upper extremity supported;During  functional activity Standing balance-Leahy Scale: Poor Standing balance comment: reliant on support                            ADL either performed or assessed with clinical judgement   ADL Overall ADL's : Needs assistance/impaired     Grooming: Wash/dry hands;Wash/dry face;Oral care;Supervision/safety;Standing Grooming Details (indicate cue type and reason): stood at sink to groom.  No physical assist needed but pt does prop on the sink. When she did not prop on sink, min guard was given and L knee tended to extend backward when standing.             Lower Body Dressing: Min guard;Sit to/from stand Lower Body Dressing Details (indicate cue type and reason): only physical assist needed is when pt is in standing.  Pt's L leg tends to give out at times and she feels like she could falll Toilet Transfer: Min guard;Ambulation;RW Toilet Transfer Details (indicate cue type and reason): Pt walked to bathroom and only required min guard during transfer.  pt begins to short step with LLE when she gets tired.  Toileting- Clothing Manipulation and Hygiene: Supervision/safety;Sitting/lateral lean       Functional mobility during ADLs: Min guard;Rolling walker General ADL Comments: Patient limited by impaired balance and generalized weakness (proximal> distal)      Vision   Vision Assessment?: No apparent visual deficits   Perception     Praxis      Cognition Arousal/Alertness: Awake/alert Behavior During Therapy: WFL for tasks assessed/performed Overall Cognitive Status: Within Functional Limits for tasks assessed  Exercises Exercises: General Upper Extremity General Exercises - Upper Extremity Shoulder Flexion: Strengthening;Both;10 reps;Seated Shoulder Extension: Strengthening;Both;10 reps;Seated Elbow Flexion: Strengthening;Both;10 reps;Seated Elbow Extension: Strengthening;Both;10 reps;Seated   Shoulder  Instructions       General Comments Pt fatigues quickly and states she is having more pain but it subsides when she can get up and move around.    Pertinent Vitals/ Pain       Pain Assessment: 0-10 Pain Score: 8  Pain Location: whole body/back/legs/shoulders Pain Descriptors / Indicators: Aching;Discomfort;Sore Pain Intervention(s): Limited activity within patient's tolerance;Monitored during session;Repositioned  Home Living                                          Prior Functioning/Environment              Frequency  Min 3X/week        Progress Toward Goals  OT Goals(current goals can now be found in the care plan section)  Progress towards OT goals: Progressing toward goals  Acute Rehab OT Goals Patient Stated Goal: to get stronger OT Goal Formulation: With patient Time For Goal Achievement: 12/01/18 Potential to Achieve Goals: Good ADL Goals Pt Will Perform Grooming: with modified independence;standing Pt Will Perform Lower Body Bathing: with modified independence;sit to/from stand Pt Will Perform Lower Body Dressing: with modified independence;sit to/from stand Pt Will Transfer to Toilet: with modified independence;ambulating;regular height toilet Pt Will Perform Tub/Shower Transfer: Tub transfer;ambulating;3 in 1;with modified independence Pt/caregiver will Perform Home Exercise Program: Increased strength;Both right and left upper extremity;With written HEP provided;With theraband Additional ADL Goal #1: Pt will verbalize 3 energy conservation techniques to utilize throughout her daily routine in order to optimize independence and safety with ADLs/IADLs.  Plan Discharge plan remains appropriate    Co-evaluation                 AM-PAC OT "6 Clicks" Daily Activity     Outcome Measure   Help from another person eating meals?: None Help from another person taking care of personal grooming?: A Little Help from another person  toileting, which includes using toliet, bedpan, or urinal?: A Little Help from another person bathing (including washing, rinsing, drying)?: A Little Help from another person to put on and taking off regular upper body clothing?: None Help from another person to put on and taking off regular lower body clothing?: A Little 6 Click Score: 20    End of Session Equipment Utilized During Treatment: Rolling walker  OT Visit Diagnosis: Other abnormalities of gait and mobility (R26.89);Muscle weakness (generalized) (M62.81)   Activity Tolerance Patient tolerated treatment well   Patient Left in bed;with call bell/phone within reach   Nurse Communication Mobility status        Time: 1478-2956 OT Time Calculation (min): 29 min  Charges: OT General Charges $OT Visit: 1 Visit OT Treatments $Self Care/Home Management : 23-37 mins  Jinger Neighbors, OTR/L [phone]   Glenford Peers 11/19/2018, 12:29 PM

## 2018-11-19 NOTE — Progress Notes (Deleted)
Cardiology Office Note   Date:  11/19/2018   ID:  Kimberly [name], DOB Jun 30, 1986, MRN [id number]  PCP:  Kimberly Sites, MD  Cardiologist:   No primary care provider on file.   No chief complaint on file.     History of Present Illness: Kimberly [name] is a 33 y.o. female who presents for edema .  I saw her most recently for palpitations. 48 hour Holter demonstrated sinus rhythm with normal circadian rhythm.  There were no sustained arrhythmias.   Echo was normal in Feb of last year.   She called recently with increased edema.  ***     She returns to get another opinion on this.  She wants to wear a 30-day event monitor because she we did not capture the episodes on the 48-hour monitor.  They are still happening.  They happen when she walks.  She will feel a thump in menopause and it feels like her heart strain to catch up.  It happens at rest.  She cannot associate it with anything.  She is reduced her caffeine dramatically.  She cannot bring these on.  She denies any chest pressure, neck or arm discomfort.  She is not having any new shortness of breath, PND or orthopnea.   Past Medical History:  Diagnosis Date  . Abnormal Pap smear   . ADD (attention deficit disorder)   . Anxiety   . Family history of anesthesia complication    mother has difficult  time going to sleep  . Headache(784.0)   . HSV-1 infection   . IBS (irritable bowel syndrome)   . Tachycardia     Past Surgical History:  Procedure Laterality Date  . BREAST BIOPSY Right   . BREAST SURGERY     Biopsy-benign  . CESAREAN SECTION  09/13/2012   Procedure: CESAREAN SECTION;  Surgeon: Lavonia Drafts, MD;  Location: [facility];  Service: Obstetrics;  Laterality: N/A;  . KYPHOPLASTY N/A 08/27/2013   Procedure: Thoracic Twelve Kyphoplasty;  Surgeon: Erline Levine, MD;  Location: [facility];  Service: Neurosurgery;  Laterality: N/A;  T12 Kyphoplasty  . WISDOM TOOTH EXTRACTION     x4  . WRIST SURGERY Right  07-23-2013     No current facility-administered medications for this visit.    No current outpatient medications on file.   Facility-Administered Medications Ordered in Other Visits  Medication Dose Route Frequency Provider Last Rate Last Dose  . acetaminophen (TYLENOL) tablet 650 mg  650 mg Oral Q6H PRN Barb Merino, MD   650 mg at 11/18/18 1707   Or  . acetaminophen (TYLENOL) suppository 650 mg  650 mg Rectal Q6H PRN Barb Merino, MD      . enoxaparin (LOVENOX) injection 40 mg  40 mg Subcutaneous Q24H Barb Merino, MD   40 mg at 11/19/18 2122  . feeding supplement (ENSURE ENLIVE) (ENSURE ENLIVE) liquid 237 mL  237 mL Oral BID BM Mariel Aloe, MD   237 mL at 11/19/18 1450  . HYDROcodone-acetaminophen (NORCO/VICODIN) 5-325 MG per tablet 1-2 tablet  1-2 tablet Oral Q6H PRN Mariel Aloe, MD   2 tablet at 11/19/18 2122  . ibuprofen (ADVIL,MOTRIN) tablet 800 mg  800 mg Oral Q6H PRN Barb Merino, MD   800 mg at 11/18/18 1219  . Immune Globulin 10% (PRIVIGEN) IV infusion 30 g  400 mg/kg Intravenous Q24 Hr x 5 Nettey, Ralph A, MD 20.6 mL/hr at 11/19/18 1018 30 g at 11/19/18 1018  . lidocaine (XYLOCAINE)  5 % ointment   Topical QID PRN Mariel Aloe, MD      . loperamide (IMODIUM) capsule 2 mg  2 mg Oral Q4H PRN Barb Merino, MD      . morphine 2 MG/ML injection 2 mg  2 mg Intravenous Q4H PRN Mariel Aloe, MD      . ondansetron (ZOFRAN) injection 4 mg  4 mg Intravenous Q6H PRN Barb Merino, MD   4 mg at 11/18/18 0818  . ondansetron (ZOFRAN-ODT) disintegrating tablet 4 mg  4 mg Oral Q8H PRN Barb Merino, MD   4 mg at 11/16/18 2012  . pantoprazole (PROTONIX) EC tablet 40 mg  40 mg Oral Daily Barb Merino, MD   40 mg at 11/19/18 0915    Allergies:   Patient has no known allergies.   ROS:  Please see the history of present illness.   Otherwise, review of systems are positive for ***.   All other systems are reviewed and negative.    PHYSICAL EXAM: VS:  There were no  vitals taken for this visit. , BMI There is no height or weight on file to calculate BMI. GENERAL:  Well appearing NECK:  No jugular venous distention, waveform within normal limits, carotid upstroke brisk and symmetric, no bruits, no thyromegaly LUNGS:  Clear to auscultation bilaterally CHEST:  Unremarkable HEART:  PMI not displaced or sustained,S1 and S2 within normal limits, no S3, no S4, no clicks, no rubs, *** murmurs ABD:  Flat, positive bowel sounds normal in frequency in pitch, no bruits, no rebound, no guarding, no midline pulsatile mass, no hepatomegaly, no splenomegaly EXT:  2 plus pulses throughout, no edema, no cyanosis no clubbing    ***GENERAL:  Well appearing HEENT:  Pupils equal round and reactive, fundi not visualized, oral mucosa unremarkable NECK:  No jugular venous distention, waveform within normal limits, carotid upstroke brisk and symmetric, no bruits, no thyromegaly LYMPHATICS:  No cervical, inguinal adenopathy LUNGS:  Clear to auscultation bilaterally BACK:  No CVA tenderness CHEST:  Unremarkable HEART:  PMI not displaced or sustained,S1 and S2 within normal limits, no S3, no S4, no clicks, no rubs, no murmurs ABD:  Flat, positive bowel sounds normal in frequency in pitch, no bruits, no rebound, no guarding, no midline pulsatile mass, no hepatomegaly, no splenomegaly EXT:  2 plus pulses throughout, no edema, no cyanosis no clubbing SKIN:  No rashes no nodules NEURO:  Cranial nerves II through XII grossly intact, motor grossly intact throughout PSYCH:  Cognitively intact, oriented to person place and time    EKG:  EKG is *** ordered today. The ekg ordered today demonstrates sinus tachycardia, rate *** , low voltage limb leads, poor anterior R wave progression.  ***  Recent Labs: 11/16/2018: ALT 37; Hemoglobin 15.7; Platelets 303; TSH 3.186 11/17/2018: Magnesium 1.9 11/19/2018: BUN 10; Creatinine, Ser 0.52; Potassium 4.4; Sodium 136    Lipid Panel No results  found for: CHOL, TRIG, HDL, CHOLHDL, VLDL, LDLCALC, LDLDIRECT    Wt Readings from Last 3 Encounters:  11/16/18 151 lb 14.4 oz (68.9 kg)  11/16/18 152 lb (68.9 kg)  11/11/18 160 lb (72.6 kg)      Other studies Reviewed: Additional studies/ records that were reviewed today include: ***. Review of the above records demonstrates:  ***   ASSESSMENT AND PLAN:  PALPITATIONS:  ***  I reviewed her Holter readings.  She continues to have palpitations.  She does not think we captured them on that monitor.  Because of the infrequency  of her arrhythmia she will need a 30-day event monitor.  She did have labs to include normal TSH and electrolytes this year.  I will follow-up with her after the event monitor.  EDEMA:  ***   She does have an abnormal EKG with low voltage that is new compared with previous.  I will check another echocardiogram.   Current medicines are reviewed at length with the patient today.  The patient does not have concerns regarding medicines.  The following changes have been made:  ***  Labs/ tests ordered today include:  ***  No orders of the defined types were placed in this encounter.    Disposition:   FU ***   Signed, Minus Breeding, MD  11/19/2018 10:15 PM    [facility]

## 2018-11-19 NOTE — Progress Notes (Addendum)
PROGRESS NOTE    Kimberly [name]  YJE:[id number] DOB: 03/15/1986 DOA: 11/16/2018 PCP: [name], MD   Brief Narrative: Kimberly [name] is a 33 y.o. female with medical history significant of anxiety, ADHD, hypokalemia. She presented with ascending numbness, lower extremity weakness and ataxia. Concern for Guillain-Barre vs nutritional deficiency. IVIG started 1/11.   Assessment & Plan:   Principal Problem:   Weakness of both lower extremities Active Problems:   Hypokalemia   Anxiety   Bilateral leg weakness Concern for Guillain-Barre vs nutritional deficiency per neurology. Started IVIG today -Neuro recommendations: multiple labs sent; IVIG  Hemorrhoids Patient will need to follow-up outpatient -Lidocaine cream  Chest tightness Unknown etiology. Persistent. Improved with pain medications. No associated dyspnea, palpitations, hemoptysis, leg swelling. Low risk for PE -Chest x-ray -EKG/troponin -Norco prn; morphine for breakthrough  Hypokalemia Chronic. Given supplementation.  UTI Present on admission. Nitrofurantoin discontinued on discharge.   DVT prophylaxis: Lovenox Code Status:   Code Status: Full Code Family Communication: Aunt at bedside Disposition Plan: Discharge pending neurology management   Consultants:   Neurology  Procedures:   IVIG (1/11>>  Antimicrobials:  None    Subjective: Numbness. New chest tightness that started recently.  Objective: Vitals:   11/19/18 0821 11/19/18 0900 11/19/18 1016 11/19/18 1108  BP: (!) 139/99 (!) 139/93 (!) 141/98 (!) 141/95  Pulse: (!) 54 61 (!) 58 (!) 51  Resp: 16 16 18 16   Temp: 98.6 F (37 C)  98.5 F (36.9 C) 98.2 F (36.8 C)  TempSrc: Oral  Oral Oral  SpO2: 94%   98%  Weight:      Height:        Intake/Output Summary (Last 24 hours) at 11/19/2018 1411 Last data filed at 11/19/2018 0902 Gross per 24 hour  Intake 360 ml  Output -  Net 360 ml   Filed Weights   11/16/18 2020    Weight: 68.9 kg    Examination:  General exam: Appears calm and comfortable Respiratory system: Clear to auscultation. Respiratory effort normal. Cardiovascular system: S1 & S2 heard, RRR. No murmurs, rubs, gallops or clicks. Gastrointestinal system: Abdomen is nondistended, soft and nontender. No organomegaly or masses felt. Normal bowel sounds heard. Central nervous system: Alert and oriented. No focal neurological deficits. Extremities: No edema. No calf tenderness Skin: No cyanosis. No rashes Psychiatry: Judgement and insight appear normal. Mood & affect appropriate.   Data Reviewed: I have personally reviewed following labs and imaging studies  CBC: Recent Labs  Lab 11/16/18 1025  WBC 8.5  NEUTROABS 6.2  HGB 15.7*  HCT 47.4*  MCV 103.9*  PLT 637   Basic Metabolic Panel: Recent Labs  Lab 11/16/18 1025 11/16/18 1345 11/17/18 0431 11/19/18 0456  NA 137  --  142 136  K 2.4*  --  3.2* 4.4  CL 99  --  107 106  CO2 25  --  25 24  GLUCOSE 130*  --  87 91  BUN 6  --  7 10  CREATININE 0.58  --  0.55 0.52  CALCIUM 9.5  --  8.6* 8.6*  MG  --  1.9 1.9  --   PHOS  --   --  3.7  --    GFR: Estimated Creatinine Clearance: 96.4 mL/min (by C-G formula based on SCr of 0.52 mg/dL). Liver Function Tests: Recent Labs  Lab 11/16/18 1025  AST 48*  ALT 37  ALKPHOS 64  BILITOT 1.1  PROT 6.7  ALBUMIN 3.7   No results  for input(s): LIPASE, AMYLASE in the last 168 hours. No results for input(s): AMMONIA in the last 168 hours. Coagulation Profile: No results for input(s): INR, PROTIME in the last 168 hours. Cardiac Enzymes: No results for input(s): CKTOTAL, CKMB, CKMBINDEX, TROPONINI in the last 168 hours. BNP (last 3 results) No results for input(s): PROBNP in the last 8760 hours. HbA1C: Recent Labs    11/16/18 1518  HGBA1C 4.9   CBG: No results for input(s): GLUCAP in the last 168 hours. Lipid Profile: No results for input(s): CHOL, HDL, LDLCALC, TRIG, CHOLHDL,  LDLDIRECT in the last 72 hours. Thyroid Function Tests: Recent Labs    11/16/18 1518  TSH 3.186   Anemia Panel: Recent Labs    11/16/18 1517  VITAMINB12 802   Sepsis Labs: Recent Labs  Lab 11/16/18 1118  LATICACIDVEN 2.85*    Recent Results (from the past 240 hour(s))  Urine culture     Status: Abnormal   Collection Time: 11/11/18  1:37 PM  Result Value Ref Range Status   Specimen Description URINE, RANDOM  Final   Special Requests   Final    NONE Performed at [facility], [place] [address] [place], [place] [address]    Culture >=100,000 COLONIES/mL ESCHERICHIA COLI (A)  Final   Report Status 11/13/2018 FINAL  Final   Organism ID, Bacteria ESCHERICHIA COLI (A)  Final      Susceptibility   Escherichia coli - MIC*    AMPICILLIN <=2 SENSITIVE Sensitive     CEFAZOLIN <=4 SENSITIVE Sensitive     CEFTRIAXONE <=1 SENSITIVE Sensitive     CIPROFLOXACIN <=0.25 SENSITIVE Sensitive     GENTAMICIN <=1 SENSITIVE Sensitive     IMIPENEM <=0.25 SENSITIVE Sensitive     NITROFURANTOIN <=16 SENSITIVE Sensitive     TRIMETH/SULFA <=20 SENSITIVE Sensitive     AMPICILLIN/SULBACTAM <=2 SENSITIVE Sensitive     PIP/TAZO <=4 SENSITIVE Sensitive     Extended ESBL NEGATIVE Sensitive     * >=100,000 COLONIES/mL ESCHERICHIA COLI  CSF culture     Status: None (Preliminary result)   Collection Time: 11/16/18 10:37 AM  Result Value Ref Range Status   Specimen Description CSF  Final   Special Requests NONE  Final   Gram Stain CYTOSPIN SMEAR NO WBC SEEN NO ORGANISMS SEEN   Final   Culture   Final    NO GROWTH 3 DAYS Performed at [facility] [address], [place], [place] [address]    Report Status PENDING  Incomplete         Radiology Studies: No results found.      Scheduled Meds: . enoxaparin (LOVENOX) injection  40 mg Subcutaneous Q24H  . feeding supplement (ENSURE ENLIVE)  237 mL Oral BID BM  . Immune Globulin 10%  400 mg/kg Intravenous Q24 Hr x 5  .  pantoprazole  40 mg Oral Daily   Continuous Infusions:   LOS: 3 days     Cordelia Poche, MD [facility] 11/19/2018, 2:11 PM  If 7PM-7AM, please contact night-coverage [email or web address]

## 2018-11-19 NOTE — Progress Notes (Signed)
Physical Therapy Treatment Patient Details Name: Kimberly [name] MRN: [id number] DOB: 1985/12/31 Today's Date: 11/19/2018    History of Present Illness Pt is a 33 y/o female admitted secondary to bilateral LE weakness. 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 PMH including but not limited to alcohol abuse, tobacco abuse and ADD.    PT Comments    Patient seen for mobility progression. Patient with subjective reports of increased pain and weakness since admission - following mobility patient reporting reduced pain and weakness. Patient continues to demonstrate unsteadiness with ataxic gait pattern with reliance on RW for support. B genu recurvatum also noted with sit to stand and during gait training. Min guard throughout transfers and mobility with cueing for safety, sequencing, posture, and obstacle navigation. PT to continue to follow.      Follow Up Recommendations  Home health PT     Equipment Recommendations  Rolling walker with 5" wheels;3in1 (PT)    Recommendations for Other Services       Precautions / Restrictions Precautions Precautions: None Restrictions Weight Bearing Restrictions: No    Mobility  Bed Mobility               General bed mobility comments: up in recliner  Transfers Overall transfer level: Needs assistance Equipment used: Rolling walker (2 wheeled) Transfers: Sit to/from Stand Sit to Stand: Min guard         General transfer comment: min guard for safety; increased use of UE to obtain stanidng position; noted genu recurvatum with initial standing posture  Ambulation/Gait Ambulation/Gait assistance: Min guard Gait Distance (Feet): 250 Feet Assistive device: Rolling walker (2 wheeled) Gait Pattern/deviations: Step-through pattern;Decreased stride length;Ataxic Gait velocity: decreased   General Gait Details: reduced mottor control;  patient reporting reduced sensation with stepping; mild genu recurvatum throughout gait;    Stairs             Wheelchair Mobility    Modified Rankin (Stroke Patients Only)       Balance Overall balance assessment: Needs assistance Sitting-balance support: No upper extremity supported;Feet supported Sitting balance-Leahy Scale: Good     Standing balance support: Bilateral upper extremity supported;During functional activity Standing balance-Leahy Scale: Poor Standing balance comment: reliant on support                             Cognition Arousal/Alertness: Awake/alert Behavior During Therapy: WFL for tasks assessed/performed Overall Cognitive Status: Within Functional Limits for tasks assessed                                        Exercises      General Comments General comments (skin integrity, edema, etc.): improved pain with mobility; patient stating improved strength as well following mobility      Pertinent Vitals/Pain Pain Assessment: 0-10 Pain Score: 10-Worst pain ever Pain Location: "whoel body" - improved with mobility Pain Descriptors / Indicators: Aching;Discomfort;Sore Pain Intervention(s): Limited activity within patient's tolerance;Monitored during session;Repositioned    Home Living                      Prior Function            PT Goals (current goals can now be found in the care plan section) Acute Rehab  PT Goals Patient Stated Goal: to get stronger PT Goal Formulation: With patient Time For Goal Achievement: 12/01/18 Potential to Achieve Goals: Good Progress towards PT goals: Progressing toward goals    Frequency    Min 3X/week      PT Plan Current plan remains appropriate    Co-evaluation              AM-PAC PT "6 Clicks" Mobility   Outcome Measure  Help needed turning from your back to your side while in a flat bed without using bedrails?: None Help needed moving from lying  on your back to sitting on the side of a flat bed without using bedrails?: None Help needed moving to and from a bed to a chair (including a wheelchair)?: A Little Help needed standing up from a chair using your arms (e.g., wheelchair or bedside chair)?: A Little Help needed to walk in hospital room?: A Little Help needed climbing 3-5 steps with a railing? : A Little 6 Click Score: 20    End of Session Equipment Utilized During Treatment: Gait belt Activity Tolerance: Patient tolerated treatment well Patient left: in chair;with call bell/phone within reach Nurse Communication: Mobility status PT Visit Diagnosis: Other abnormalities of gait and mobility (R26.89);Muscle weakness (generalized) (M62.81)     Time: 4268-3419 PT Time Calculation (min) (ACUTE ONLY): 18 min  Charges:  $Gait Training: 8-22 mins                      Lanney Gins, PT, DPT Supplemental Physical Therapist 11/19/18 10:56 AM Pager: [phone] Office: [phone]

## 2018-11-20 NOTE — Care Management Note (Signed)
Case Management Note  Patient Details  Name: Kimberly [name] MRN: [id number] Date of Birth: Feb 20, 1986  Subjective/Objective:     Pt admitted with weakness of bilateral lower extremities. She is from home with spouse.  DME: none No issues obtaining meds or with transportation.               Action/Plan: Recommendations are for [facility] services. CM provided her choice and will f/u in am on her selection. Pt with orders for walker and 3 in 1. CM will have the DME delivered to her room prior to d/c.  CM following.  Expected Discharge Date:  11/19/18               Expected Discharge Plan:  [place]  In-House Referral:     Discharge planning Services  CM Consult  Post Acute Care Choice:  Home Health, Durable Medical Equipment Choice offered to:  Patient  DME Arranged:  3-N-1, Walker rolling DME Agency:  [place]:  PT, OT [facility] Agency:     Status of Service:  In process, will continue to follow  If discussed at Long Length of Stay Meetings, dates discussed:    Additional Comments:  Pollie Friar, RN 11/20/2018, 2:49 PM

## 2018-11-20 NOTE — Progress Notes (Signed)
PROGRESS NOTE    Kimberly [name]  OBS:[id number] DOB: [birth year] DOA: 11/16/2018 PCP: [name], MD   Brief Narrative: Kimberly [name] is a 33 y.o. female with medical history significant of anxiety, ADHD, hypokalemia. She presented with ascending numbness, lower extremity weakness and ataxia. Concern for Guillain-Barre vs nutritional deficiency. IVIG started 1/11.   Assessment & Plan:   Principal Problem:   Weakness of both lower extremities Active Problems:   Hypokalemia   Anxiety   Bilateral leg weakness Concern for Guillain-Barre vs nutritional deficiency per neurology. Started IVIG today -Neuro recommendations: multiple labs sent; IVIG  Hemorrhoids Patient will need to follow-up outpatient -Lidocaine cream  Chest tightness Unknown etiology. Persistent. Improved with pain medications. No associated dyspnea, palpitations, hemoptysis, leg swelling. Low risk for PE. Chest x-ray significant for bilateral small pleural effusions, EKG unremarkable and troponin negative. Tightness improved today. -Norco prn; morphine for breakthrough  Hypokalemia Chronic. Given supplementation.  UTI Present on admission. Nitrofurantoin discontinued on admission.   DVT prophylaxis: Lovenox Code Status:   Code Status: Full Code Family Communication: Aunt at bedside Disposition Plan: Discharge pending neurology management   Consultants:   Neurology  Procedures:   IVIG (1/11>>  Antimicrobials:  None    Subjective: Chest tightness improved. Still having numbness and weakness. She has not had very much improvement since starting treatment. No other concerns currently. Hoping to be discharged tomorrow.  Objective: Vitals:   11/20/18 1028 11/20/18 1101 11/20/18 1154 11/20/18 1303  BP: 136/72 (!) 131/92 138/80 (!) 147/78  Pulse: (!) 52 (!) 54 (!) 50 (!) 54  Resp: 16 18 20 18   Temp: 98.3 F (36.8 C) 98.1 F (36.7 C) 98.4 F (36.9 C) 98.2 F (36.8 C)  TempSrc: Oral  Oral Oral Oral  SpO2: 96% 97% 98% 97%  Weight:      Height:        Intake/Output Summary (Last 24 hours) at 11/20/2018 1455 Last data filed at 11/20/2018 0855 Gross per 24 hour  Intake 240 ml  Output -  Net 240 ml   Filed Weights   11/16/18 2020 11/20/18 0500  Weight: 68.9 kg 74.5 kg    Examination:  General exam: Appears calm and comfortable Respiratory system: Respiratory effort normal Gastrointestinal system: Abdomen is nondistended Central nervous system: Alert and oriented. Moves extremities spontaneously Extremities: No edema. Skin: No cyanosis. No rashes Psychiatry: Judgement and insight appear normal. Mood & affect appropriate.    Data Reviewed: I have personally reviewed following labs and imaging studies  CBC: Recent Labs  Lab 11/16/18 1025 11/16/18 1518 11/16/18 1922  WBC 8.5  --   --   NEUTROABS 6.2  --   --   HGB 15.7*  --   --   HCT 47.4* 40.2 39.5  MCV 103.9*  --   --   PLT 303  --   --    Basic Metabolic Panel: Recent Labs  Lab 11/16/18 1025 11/16/18 1345 11/17/18 0431 11/19/18 0456  NA 137  --  142 136  K 2.4*  --  3.2* 4.4  CL 99  --  107 106  CO2 25  --  25 24  GLUCOSE 130*  --  87 91  BUN 6  --  7 10  CREATININE 0.58  --  0.55 0.52  CALCIUM 9.5  --  8.6* 8.6*  MG  --  1.9 1.9  --   PHOS  --   --  3.7  --    GFR: Estimated Creatinine  Clearance: 104.5 mL/min (by C-G formula based on SCr of 0.52 mg/dL). Liver Function Tests: Recent Labs  Lab 11/16/18 1025  AST 48*  ALT 37  ALKPHOS 64  BILITOT 1.1  PROT 6.7  ALBUMIN 3.7   No results for input(s): LIPASE, AMYLASE in the last 168 hours. No results for input(s): AMMONIA in the last 168 hours. Coagulation Profile: No results for input(s): INR, PROTIME in the last 168 hours. Cardiac Enzymes: Recent Labs  Lab 11/19/18 1402  TROPONINI <0.03   BNP (last 3 results) No results for input(s): PROBNP in the last 8760 hours. HbA1C: No results for input(s): HGBA1C in the last 72  hours. CBG: No results for input(s): GLUCAP in the last 168 hours. Lipid Profile: No results for input(s): CHOL, HDL, LDLCALC, TRIG, CHOLHDL, LDLDIRECT in the last 72 hours. Thyroid Function Tests: No results for input(s): TSH, T4TOTAL, FREET4, T3FREE, THYROIDAB in the last 72 hours. Anemia Panel: No results for input(s): VITAMINB12, FOLATE, FERRITIN, TIBC, IRON, RETICCTPCT in the last 72 hours. Sepsis Labs: Recent Labs  Lab 11/16/18 1118  LATICACIDVEN 2.85*    Recent Results (from the past 240 hour(s))  Urine culture     Status: Abnormal   Collection Time: 11/11/18  1:37 PM  Result Value Ref Range Status   Specimen Description URINE, RANDOM  Final   Special Requests   Final    NONE Performed at [facility], [place] [address] [place], [place] [address]    Culture >=100,000 COLONIES/mL ESCHERICHIA COLI (A)  Final   Report Status 11/13/2018 FINAL  Final   Organism ID, Bacteria ESCHERICHIA COLI (A)  Final      Susceptibility   Escherichia coli - MIC*    AMPICILLIN <=2 SENSITIVE Sensitive     CEFAZOLIN <=4 SENSITIVE Sensitive     CEFTRIAXONE <=1 SENSITIVE Sensitive     CIPROFLOXACIN <=0.25 SENSITIVE Sensitive     GENTAMICIN <=1 SENSITIVE Sensitive     IMIPENEM <=0.25 SENSITIVE Sensitive     NITROFURANTOIN <=16 SENSITIVE Sensitive     TRIMETH/SULFA <=20 SENSITIVE Sensitive     AMPICILLIN/SULBACTAM <=2 SENSITIVE Sensitive     PIP/TAZO <=4 SENSITIVE Sensitive     Extended ESBL NEGATIVE Sensitive     * >=100,000 COLONIES/mL ESCHERICHIA COLI  CSF culture     Status: None   Collection Time: 11/16/18 10:37 AM  Result Value Ref Range Status   Specimen Description CSF  Final   Special Requests NONE  Final   Gram Stain CYTOSPIN SMEAR NO WBC SEEN NO ORGANISMS SEEN   Final   Culture   Final    NO GROWTH 3 DAYS Performed at [facility], [place] [address], [place], [place] [address]    Report Status 11/19/2018 FINAL  Final         Radiology Studies: Dg Chest 2  View  Result Date: 11/19/2018 CLINICAL DATA:  Chest tightness EXAM: CHEST - 2 VIEW COMPARISON:  November 11, 2018 FINDINGS: There are small pleural effusions bilaterally with bibasilar atelectasis. The lungs elsewhere are clear. Heart size and pulmonary vascularity are normal. No adenopathy. No pneumothorax. Patient is status post kyphoplasty at T12 for wedge fracture, stable. IMPRESSION: Small pleural effusions bilaterally with mild bibasilar atelectasis. No edema or consolidation. Stable cardiac silhouette. Electronically Signed   By: Lowella Grip III M.D.   On: 11/19/2018 16:56        Scheduled Meds: . enoxaparin (LOVENOX) injection  40 mg Subcutaneous Q24H  . feeding supplement (ENSURE  ENLIVE)  237 mL Oral BID BM  . Immune Globulin 10%  400 mg/kg Intravenous Q24 Hr x 5  . pantoprazole  40 mg Oral Daily   Continuous Infusions:   LOS: 4 days     Cordelia Poche, MD [facility] 11/20/2018, 2:55 PM  If 7PM-7AM, please contact night-coverage [email or web address]

## 2018-11-21 ENCOUNTER — Ambulatory Visit: Payer: BLUE CROSS/BLUE SHIELD | Admitting: Cardiology

## 2018-11-21 DIAGNOSIS — G610 Guillain-Barre syndrome: Principal | ICD-10-CM

## 2018-11-21 LAB — MISC LABCORP TEST (SEND OUT): Labcorp test code: 808602

## 2018-11-21 MED ORDER — HYDROCODONE-ACETAMINOPHEN 5-325 MG PO TABS: 1.0000 | ORAL_TABLET | Freq: Four times a day (QID) | ORAL | 0 refills | Status: DC | PRN

## 2018-11-21 MED ORDER — GABAPENTIN 100 MG PO CAPS: 100.0000 mg | ORAL_CAPSULE | Freq: Three times a day (TID) | ORAL | 0 refills | Status: DC

## 2018-11-21 NOTE — Progress Notes (Signed)
Occupational Therapy Treatment Patient Details Name: Kimberly [name] MRN: [id number] DOB: Oct 11, 1986 Today's Date: 11/21/2018    History of present illness Pt is a 33 y/o female admitted secondary to bilateral LE weakness. 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 IVIG started 11/17/18. PMH including but not limited to alcohol abuse, tobacco abuse and ADD.   OT comments  Patient progressing slowly.  Continues to be limited by impaired balance, decreased strength, and impaired sensation. Reporting B UE numbness/tingling, L >R and anterior > posterior, with reports of increased numbness when waking this morning.  Patient currently requires min guard for transfers and short distance mobility using RW, reviewed safety with bathing, shower transfers using 3:1 and reverse step using RW, and safety/fall prevention techniques due to weakness in LEs and poor balance recommended sitting to complete ADLs (pt agreeable).  Continue to recommend assist when standing, pt agreeable.  Reviewed theraband exercises with focus on controlled ROM with proximal strengthening of shoulder/elbow.  Will follow.    Follow Up Recommendations  Home health OT    Equipment Recommendations  3 in 1 bedside commode    Recommendations for Other Services      Precautions / Restrictions Precautions Precautions: None Restrictions Weight Bearing Restrictions: No Other Position/Activity Restrictions: LUE/LLE significantly weaker than R side.       Mobility Bed Mobility Overal bed mobility: Modified Independent             General bed mobility comments: no physical assist needed.  Transfers Overall transfer level: Needs assistance Equipment used: Rolling walker (2 wheeled) Transfers: Sit to/from Stand Sit to Stand: Min guard         General transfer comment: min guard for safety; increased use of UE to  obtain standing; poor eccentric control    Balance Overall balance assessment: Needs assistance Sitting-balance support: No upper extremity supported;Feet supported Sitting balance-Leahy Scale: Good     Standing balance support: Bilateral upper extremity supported;During functional activity Standing balance-Leahy Scale: Poor Standing balance comment: reliant on support                            ADL either performed or assessed with clinical judgement   ADL Overall ADL's : Needs assistance/impaired     Grooming: Min guard;Standing Grooming Details (indicate cue type and reason): reviewed safety techniques and sitting as needed if alone              Lower Body Dressing: Min guard;Sit to/from stand Lower Body Dressing Details (indicate cue type and reason): continues to require min guard for safety in standing          Tub/ Shower Transfer: Walk-in shower;Min guard;Ambulation;Cueing for sequencing;Cueing for safety;3 in 1 Tub/Shower Transfer Details (indicate cue type and reason): reviewed safety and reverse step transfer using RW over shower threshold to 3:1; poor eccentric control on descend Functional mobility during ADLs: Min guard;Rolling walker General ADL Comments: Patient limited by impaired balance and generalized weakness (proximal> distal)      Vision   Vision Assessment?: No apparent visual deficits   Perception     Praxis      Cognition Arousal/Alertness: Awake/alert Behavior During Therapy: WFL for tasks assessed/performed Overall Cognitive Status: Within Functional Limits for tasks assessed  Exercises Exercises: General Upper Extremity General Exercises - Upper Extremity Shoulder Flexion: Strengthening;Both;5 reps;Seated;Theraband(focused on control to 90 only) Theraband Level (Shoulder Flexion): Level 3 (Green) Shoulder Extension: Strengthening;Both;5  reps;Seated;Theraband(focused on control to 90 only) Theraband Level (Shoulder Extension): Level 3 (Green) Elbow Flexion: Strengthening;Both;5 reps;Seated;Theraband(focused on control) Theraband Level (Elbow Flexion): Level 3 (Green) Elbow Extension: Strengthening;5 reps;Both;Seated;Theraband(focused on control) Theraband Level (Elbow Extension): Level 3 (Green)   Shoulder Instructions       General Comments      Pertinent Vitals/ Pain       Pain Assessment: 0-10 Faces Pain Scale: Hurts little more Pain Location: whole body/back/legs/shoulders Pain Descriptors / Indicators: Aching;Discomfort;Sore Pain Intervention(s): Monitored during session  Home Living                                          Prior Functioning/Environment              Frequency  Min 3X/week        Progress Toward Goals  OT Goals(current goals can now be found in the care plan section)  Progress towards OT goals: Progressing toward goals  Acute Rehab OT Goals Patient Stated Goal: to get stronger OT Goal Formulation: With patient Time For Goal Achievement: 12/01/18 Potential to Achieve Goals: Good  Plan Discharge plan remains appropriate    Co-evaluation                 AM-PAC OT "6 Clicks" Daily Activity     Outcome Measure   Help from another person eating meals?: None Help from another person taking care of personal grooming?: A Little Help from another person toileting, which includes using toliet, bedpan, or urinal?: A Little Help from another person bathing (including washing, rinsing, drying)?: A Little Help from another person to put on and taking off regular upper body clothing?: None Help from another person to put on and taking off regular lower body clothing?: A Little 6 Click Score: 20    End of Session Equipment Utilized During Treatment: Rolling walker  OT Visit Diagnosis: Other abnormalities of gait and mobility (R26.89);Muscle weakness  (generalized) (M62.81)   Activity Tolerance Patient tolerated treatment well   Patient Left in bed;with call bell/phone within reach   Nurse Communication Mobility status        Time: 5102-5852 OT Time Calculation (min): 23 min  Charges: OT General Charges $OT Visit: 1 Visit OT Treatments $Self Care/Home Management : 8-22 mins $Therapeutic Exercise: 8-22 mins  Delight Stare, OT [facility] [place] Pager [phone] Office [phone]    Delight Stare 11/21/2018, 8:35 AM

## 2018-11-21 NOTE — Discharge Summary (Signed)
Physician Discharge Summary  ANALLELY ROSELL KCL:[id number] DOB: August 26, 1986 DOA: 11/16/2018  PCP: [name], MD  Admit date: 11/16/2018 Discharge date: 11/21/2018  Admitted From: Home Disposition:  Home  Discharge Condition:Stable CODE STATUS:FULL Diet recommendation: Regular   Brief/Interim Summary: JANALYNN [name] is a 33 y.o. femalewith medical history significant ofanxiety, ADHD, hypokalemia. She presented with ascending numbness, lower extremity weakness and ataxia. Concern for Guillain-Barre vs nutritional deficiency. IVIG started 1/11. She completed 5 days of IVIG.  Her symptoms have not significantly improved.  She still complains of numbness of her body below her chest.  PT/OT evaluated her here and recommended home health PT/OT.  Neurology cleared her for discharge to follow as an outpatient with [facility] neurology.  Following problems were addressed during her hospitalization:  Bilateral leg weakness Concern for Guillain-Barre  per neurology. Started IVIG .Completed 5 days therapy. Follow up with neurology as an outpatient. Neurology planning for plasma exchange if no improvement in her symptoms over period of   2 weeks.  Plan for outpatient EMG/nerve conduction tests.  Tingling:  Started on gabapentin.Dose can be adjusted as an outpatient.  Hemorrhoids Outpatient follow up recommended.  Chest tightness Unknown etiology.Improved with pain medications. No associated dyspnea, palpitations, hemoptysis, leg swelling. Low risk for PE. Chest x-ray significant for bilateral small pleural effusions, EKG unremarkable and troponin negative. Tightness improved today.  Hypokalemia Corrected  UTI Dysuria symptoms.  Antibiotics discontinued   Discharge Diagnoses:  Principal Problem:   Weakness of both lower extremities Active Problems:   Hypokalemia   Anxiety    Discharge Instructions  Discharge Instructions    Diet general   Complete by:  As directed     Discharge instructions   Complete by:  As directed    1)Please follow up with [facility] neurology within 2 weeks 2)Take prescribed medications as instructed.   Increase activity slowly   Complete by:  As directed      Allergies as of 11/21/2018   No Known Allergies     Medication List    STOP taking these medications   nitrofurantoin (macrocrystal-monohydrate) 100 MG capsule Commonly known as:  MACROBID     TAKE these medications   gabapentin 100 MG capsule Commonly known as:  NEURONTIN Take 1 capsule (100 mg total) by mouth 3 (three) times daily for 30 days.   HYDROcodone-acetaminophen 5-325 MG tablet Commonly known as:  NORCO/VICODIN Take 1-2 tablets by mouth every 6 (six) hours as needed for moderate pain.   hydrocortisone 2.5 % rectal cream Commonly known as:  ANUSOL-HC Place 1 application rectally 2 (two) times daily. Use for 14 days What changed:    when to take this  reasons to take this  additional instructions   ibuprofen 200 MG tablet Commonly known as:  ADVIL,MOTRIN Take 800 mg by mouth every 6 (six) hours as needed for mild pain or moderate pain.   levonorgestrel 20 MCG/24HR IUD Commonly known as:  MIRENA 1 each by Intrauterine route once.   loperamide 2 MG capsule Commonly known as:  IMODIUM Take by mouth as needed for diarrhea or loose stools.   multivitamin tablet Take 1 tablet by mouth daily.   ondansetron 4 MG disintegrating tablet Commonly known as:  ZOFRAN ODT Take 1 tablet (4 mg total) by mouth every 8 (eight) hours as needed for nausea or vomiting.   ondansetron 4 MG tablet Commonly known as:  ZOFRAN Take every 4-6 hours as needed.   pantoprazole 40 MG tablet Commonly known as:  PROTONIX TAKE 1 TABLET (40 MG TOTAL) BY MOUTH DAILY. TAKE 30-60 MINUTES BEFORE BREAKFAST. What changed:  additional instructions   VIBERZI 75 MG Tabs Generic drug:  Eluxadoline Take 1 tablet by mouth as needed.            Durable Medical Equipment   (From admission, onward)         Start     Ordered   11/20/18 1456  For home use only DME 3 n 1  Once     11/20/18 1455   11/20/18 1456  For home use only DME Walker rolling  Once    Question:  Patient needs a walker to treat with the following condition  Answer:  Lower extremity weakness   11/20/18 1455         Follow-up Information    [name], MD. Schedule an appointment as soon as possible for a visit in 1 week(s).   Specialty:  Family Medicine Contact information: [address] [place] [address] [phone]          No Known Allergies  Consultations:  neurology   Procedures/Studies: Dg Chest 2 View  Result Date: 11/19/2018 CLINICAL DATA:  Chest tightness EXAM: CHEST - 2 VIEW COMPARISON:  November 11, 2018 FINDINGS: There are small pleural effusions bilaterally with bibasilar atelectasis. The lungs elsewhere are clear. Heart size and pulmonary vascularity are normal. No adenopathy. No pneumothorax. Patient is status post kyphoplasty at T12 for wedge fracture, stable. IMPRESSION: Small pleural effusions bilaterally with mild bibasilar atelectasis. No edema or consolidation. Stable cardiac silhouette. Electronically Signed   By: Lowella Grip III M.D.   On: 11/19/2018 16:56   Dg Chest 2 View  Result Date: 11/11/2018 CLINICAL DATA:  Nausea vomiting and diarrhea for several weeks. Dehydration. Cough EXAM: CHEST - 2 VIEW COMPARISON:  None. FINDINGS: The heart size and mediastinal contours are within normal limits. Both lungs are clear. The visualized skeletal structures are unremarkable. IMPRESSION: No active cardiopulmonary disease. Electronically Signed   By: Kerby Moors M.D.   On: 11/11/2018 14:55   Ct Head Wo Contrast  Result Date: 11/16/2018 CLINICAL DATA:  Left-sided numbness and tingling EXAM: CT HEAD WITHOUT CONTRAST TECHNIQUE: Contiguous axial images were obtained from the base of the skull through the vertex without intravenous contrast.  COMPARISON:  September 04, 2007 FINDINGS: Brain: Ventricles are normal in size and configuration. There is no intracranial mass, hemorrhage, extra-axial fluid collection, or midline shift. Brain parenchyma appears unremarkable. No acute infarct evident. Vascular: There is no evident hyperdense vessel. There is minimal calcification in each carotid siphon region. Skull: The bony calvarium appears intact. Sinuses/Orbits: Visualized paranasal sinuses are clear. Visualized orbits appear symmetric bilaterally. Other: Mastoid air cells are clear. IMPRESSION: Minimal arterial vascular calcification. Study otherwise unremarkable. Electronically Signed   By: Lowella Grip III M.D.   On: 11/16/2018 11:46   Mr Cervical Spine W Or Wo Contrast  Result Date: 11/16/2018 CLINICAL DATA:  Ascending lower extremity weakness, numbness, and tingling, now to the level of T5 on neurological exam. Hypo over flecks of lower extremities. Recent viral gastrointestinal illness a few weeks ago. EXAM: MRI CERVICAL, THORACIC AND LUMBAR SPINE WITH AND WITHOUT CONTRAST TECHNIQUE: Multiplanar and multiecho pulse sequences of the cervical spine, to include the craniocervical junction and cervicothoracic junction, and thoracic and lumbar spine, were obtained without intravenous contrast. COMPARISON:  CT thoracic spine dated July 21, 2013. CT abdomen pelvis dated July 20, 2004. FINDINGS: MRI CERVICAL SPINE FINDINGS Alignment: Physiologic. Vertebrae:  No acute fracture, evidence of discitis, or bone lesion. Old mild T1 superior endplate compression deformity. Cord: Normal signal and morphology.  No intradural enhancement. Posterior Fossa, vertebral arteries, paraspinal tissues: Negative. Disc levels: C2-C3:  Negative. C3-C4:  Negative. C4-C5:  Minimal disc bulge.  No stenosis. C5-C6:  Tiny central disc protrusion.  No stenosis. C6-C7:  Negative. C7-T1:  Negative. MRI THORACIC SPINE FINDINGS Alignment:  Slight focal kyphosis at T12.  Vertebrae: No acute fracture, evidence of discitis, or bone lesion. Chronic mild T12 compression deformity status post cement augmentation 2 mm retropulsion of the posterior superior endplate. Chronic mild superior endplate compression deformities of T1 and T3. Cord:  Normal signal and morphology.  No intradural enhancement. Paraspinal and other soft tissues: Subsegmental atelectasis in both lower lobes. Disc levels: Mild disc degeneration at T6-T7 without significant disc bulge or herniation. Small diffuse disc bulge at T11-T12. No spinal canal or neuroforaminal stenosis at any level. MRI LUMBAR SPINE FINDINGS Segmentation: Assumed standard. The last well-formed disc space is designated L5-S1 for the purposes of this report. Alignment:  Physiologic. Vertebrae: No acute fracture, evidence of discitis, or suspicious bone lesion. L3 hemangioma. Conus medullaris and cauda equina: Conus extends to the L1-L2 level. Conus and cauda equina appear normal. Mild enhancement of the ventral right S1 nerve root. No other abnormal intradural enhancement. Paraspinal and other soft tissues: Negative. Disc levels: T12-L1 to L4-L5:  Negative. L5-S1: Small central disc protrusion with annular fissure. No stenosis. IMPRESSION: Cervical spine: 1. No acute abnormality. 2. Minimal degenerative disc disease at C4-C5 and C5-C6. Thoracic spine: 1. No acute abnormality. 2. Chronic T1, T3, and T12 compression deformities. 3. Mild degenerative disc disease at T6-T7 and T11-T12. Lumbar spine: 1. Mild enhancement of the ventral right S1 nerve root, consistent with radiculitis. No additional abnormal intradural enhancement. 2. Mild degenerative disc disease at L5-S1. Electronically Signed   By: Titus Dubin M.D.   On: 11/16/2018 17:24   Mr Thoracic Spine W Wo Contrast  Result Date: 11/16/2018 CLINICAL DATA:  Ascending lower extremity weakness, numbness, and tingling, now to the level of T5 on neurological exam. Hypo over flecks of lower  extremities. Recent viral gastrointestinal illness a few weeks ago. EXAM: MRI CERVICAL, THORACIC AND LUMBAR SPINE WITH AND WITHOUT CONTRAST TECHNIQUE: Multiplanar and multiecho pulse sequences of the cervical spine, to include the craniocervical junction and cervicothoracic junction, and thoracic and lumbar spine, were obtained without intravenous contrast. COMPARISON:  CT thoracic spine dated July 21, 2013. CT abdomen pelvis dated July 20, 2004. FINDINGS: MRI CERVICAL SPINE FINDINGS Alignment: Physiologic. Vertebrae: No acute fracture, evidence of discitis, or bone lesion. Old mild T1 superior endplate compression deformity. Cord: Normal signal and morphology.  No intradural enhancement. Posterior Fossa, vertebral arteries, paraspinal tissues: Negative. Disc levels: C2-C3:  Negative. C3-C4:  Negative. C4-C5:  Minimal disc bulge.  No stenosis. C5-C6:  Tiny central disc protrusion.  No stenosis. C6-C7:  Negative. C7-T1:  Negative. MRI THORACIC SPINE FINDINGS Alignment:  Slight focal kyphosis at T12. Vertebrae: No acute fracture, evidence of discitis, or bone lesion. Chronic mild T12 compression deformity status post cement augmentation 2 mm retropulsion of the posterior superior endplate. Chronic mild superior endplate compression deformities of T1 and T3. Cord:  Normal signal and morphology.  No intradural enhancement. Paraspinal and other soft tissues: Subsegmental atelectasis in both lower lobes. Disc levels: Mild disc degeneration at T6-T7 without significant disc bulge or herniation. Small diffuse disc bulge at T11-T12. No spinal canal or neuroforaminal stenosis at any  level. MRI LUMBAR SPINE FINDINGS Segmentation: Assumed standard. The last well-formed disc space is designated L5-S1 for the purposes of this report. Alignment:  Physiologic. Vertebrae: No acute fracture, evidence of discitis, or suspicious bone lesion. L3 hemangioma. Conus medullaris and cauda equina: Conus extends to the L1-L2 level.  Conus and cauda equina appear normal. Mild enhancement of the ventral right S1 nerve root. No other abnormal intradural enhancement. Paraspinal and other soft tissues: Negative. Disc levels: T12-L1 to L4-L5:  Negative. L5-S1: Small central disc protrusion with annular fissure. No stenosis. IMPRESSION: Cervical spine: 1. No acute abnormality. 2. Minimal degenerative disc disease at C4-C5 and C5-C6. Thoracic spine: 1. No acute abnormality. 2. Chronic T1, T3, and T12 compression deformities. 3. Mild degenerative disc disease at T6-T7 and T11-T12. Lumbar spine: 1. Mild enhancement of the ventral right S1 nerve root, consistent with radiculitis. No additional abnormal intradural enhancement. 2. Mild degenerative disc disease at L5-S1. Electronically Signed   By: Titus Dubin M.D.   On: 11/16/2018 17:24   Mr Lumbar Spine W Wo Contrast  Result Date: 11/16/2018 CLINICAL DATA:  Ascending lower extremity weakness, numbness, and tingling, now to the level of T5 on neurological exam. Hypo over flecks of lower extremities. Recent viral gastrointestinal illness a few weeks ago. EXAM: MRI CERVICAL, THORACIC AND LUMBAR SPINE WITH AND WITHOUT CONTRAST TECHNIQUE: Multiplanar and multiecho pulse sequences of the cervical spine, to include the craniocervical junction and cervicothoracic junction, and thoracic and lumbar spine, were obtained without intravenous contrast. COMPARISON:  CT thoracic spine dated July 21, 2013. CT abdomen pelvis dated July 20, 2004. FINDINGS: MRI CERVICAL SPINE FINDINGS Alignment: Physiologic. Vertebrae: No acute fracture, evidence of discitis, or bone lesion. Old mild T1 superior endplate compression deformity. Cord: Normal signal and morphology.  No intradural enhancement. Posterior Fossa, vertebral arteries, paraspinal tissues: Negative. Disc levels: C2-C3:  Negative. C3-C4:  Negative. C4-C5:  Minimal disc bulge.  No stenosis. C5-C6:  Tiny central disc protrusion.  No stenosis. C6-C7:   Negative. C7-T1:  Negative. MRI THORACIC SPINE FINDINGS Alignment:  Slight focal kyphosis at T12. Vertebrae: No acute fracture, evidence of discitis, or bone lesion. Chronic mild T12 compression deformity status post cement augmentation 2 mm retropulsion of the posterior superior endplate. Chronic mild superior endplate compression deformities of T1 and T3. Cord:  Normal signal and morphology.  No intradural enhancement. Paraspinal and other soft tissues: Subsegmental atelectasis in both lower lobes. Disc levels: Mild disc degeneration at T6-T7 without significant disc bulge or herniation. Small diffuse disc bulge at T11-T12. No spinal canal or neuroforaminal stenosis at any level. MRI LUMBAR SPINE FINDINGS Segmentation: Assumed standard. The last well-formed disc space is designated L5-S1 for the purposes of this report. Alignment:  Physiologic. Vertebrae: No acute fracture, evidence of discitis, or suspicious bone lesion. L3 hemangioma. Conus medullaris and cauda equina: Conus extends to the L1-L2 level. Conus and cauda equina appear normal. Mild enhancement of the ventral right S1 nerve root. No other abnormal intradural enhancement. Paraspinal and other soft tissues: Negative. Disc levels: T12-L1 to L4-L5:  Negative. L5-S1: Small central disc protrusion with annular fissure. No stenosis. IMPRESSION: Cervical spine: 1. No acute abnormality. 2. Minimal degenerative disc disease at C4-C5 and C5-C6. Thoracic spine: 1. No acute abnormality. 2. Chronic T1, T3, and T12 compression deformities. 3. Mild degenerative disc disease at T6-T7 and T11-T12. Lumbar spine: 1. Mild enhancement of the ventral right S1 nerve root, consistent with radiculitis. No additional abnormal intradural enhancement. 2. Mild degenerative disc disease at L5-S1. Electronically  Signed   By: Titus Dubin M.D.   On: 11/16/2018 17:24       Subjective: Patient seen and examined the bedside this afternoon.  She still has numbness on her body  starting from her chest down.  Also complains of tingling sensation all over.  Has weakness of bilateral upper and lower extremities but is able to walk with the help of walker.  Discharge Exam: Vitals:   11/21/18 1040 11/21/18 1055  BP: 138/88 (!) 152/83  Pulse: (!) 59 (!) 59  Resp:    Temp: 97.9 F (36.6 C) 97.9 F (36.6 C)  SpO2: 100% 100%   Vitals:   11/21/18 1011 11/21/18 1025 11/21/18 1040 11/21/18 1055  BP: (!) 145/99 (!) 139/92 138/88 (!) 152/83  Pulse: 72 68 (!) 59 (!) 59  Resp: 20 16    Temp: 97.9 F (36.6 C) 98 F (36.7 C) 97.9 F (36.6 C) 97.9 F (36.6 C)  TempSrc: Oral Oral Oral Oral  SpO2:  100% 100% 100%  Weight:      Height:        General: Pt is alert, awake, not in acute distress Cardiovascular: RRR, S1/S2 +, no rubs, no gallops Respiratory: CTA bilaterally, no wheezing, no rhonchi Abdominal: Soft, NT, ND, bowel sounds + Extremities: no edema, no cyanosis    The results of significant diagnostics from this hospitalization (including imaging, microbiology, ancillary and laboratory) are listed below for reference.     Microbiology: Recent Results (from the past 240 hour(s))  CSF culture     Status: None   Collection Time: 11/16/18 10:37 AM  Result Value Ref Range Status   Specimen Description CSF  Final   Special Requests NONE  Final   Gram Stain CYTOSPIN SMEAR NO WBC SEEN NO ORGANISMS SEEN   Final   Culture   Final    NO GROWTH 3 DAYS Performed at [facility], [address] [place], [place] [address]    Report Status 11/19/2018 FINAL  Final     Labs: BNP (last 3 results) No results for input(s): BNP in the last 8760 hours. Basic Metabolic Panel: Recent Labs  Lab 11/16/18 1025 11/16/18 1345 11/17/18 0431 11/19/18 0456  NA 137  --  142 136  K 2.4*  --  3.2* 4.4  CL 99  --  107 106  CO2 25  --  25 24  GLUCOSE 130*  --  87 91  BUN 6  --  7 10  CREATININE 0.58  --  0.55 0.52  CALCIUM 9.5  --  8.6* 8.6*  MG  --  1.9 1.9   --   PHOS  --   --  3.7  --    Liver Function Tests: Recent Labs  Lab 11/16/18 1025  AST 48*  ALT 37  ALKPHOS 64  BILITOT 1.1  PROT 6.7  ALBUMIN 3.7   No results for input(s): LIPASE, AMYLASE in the last 168 hours. No results for input(s): AMMONIA in the last 168 hours. CBC: Recent Labs  Lab 11/16/18 1025 11/16/18 1518 11/16/18 1922  WBC 8.5  --   --   NEUTROABS 6.2  --   --   HGB 15.7*  --   --   HCT 47.4* 40.2 39.5  MCV 103.9*  --   --   PLT 303  --   --    Cardiac Enzymes: Recent Labs  Lab 11/19/18 1402  TROPONINI <0.03   BNP: Invalid input(s): POCBNP CBG: No  results for input(s): GLUCAP in the last 168 hours. D-Dimer No results for input(s): DDIMER in the last 72 hours. Hgb A1c No results for input(s): HGBA1C in the last 72 hours. Lipid Profile No results for input(s): CHOL, HDL, LDLCALC, TRIG, CHOLHDL, LDLDIRECT in the last 72 hours. Thyroid function studies No results for input(s): TSH, T4TOTAL, T3FREE, THYROIDAB in the last 72 hours.  Invalid input(s): FREET3 Anemia work up No results for input(s): VITAMINB12, FOLATE, FERRITIN, TIBC, IRON, RETICCTPCT in the last 72 hours. Urinalysis    Component Value Date/Time   COLORURINE AMBER (A) 11/11/2018 1010   APPEARANCEUR CLOUDY (A) 11/11/2018 1010   LABSPEC 1.026 11/11/2018 1010   PHURINE 6.0 11/11/2018 1010   GLUCOSEU 50 (A) 11/11/2018 1010   HGBUR NEGATIVE 11/11/2018 1010   BILIRUBINUR MODERATE (A) 11/11/2018 1010   KETONESUR 5 (A) 11/11/2018 1010   PROTEINUR 100 (A) 11/11/2018 1010   UROBILINOGEN 0.2 06/03/2014 1311   NITRITE NEGATIVE 11/11/2018 1010   LEUKOCYTESUR SMALL (A) 11/11/2018 1010   Sepsis Labs Invalid input(s): PROCALCITONIN,  WBC,  LACTICIDVEN Microbiology Recent Results (from the past 240 hour(s))  CSF culture     Status: None   Collection Time: 11/16/18 10:37 AM  Result Value Ref Range Status   Specimen Description CSF  Final   Special Requests NONE  Final   Gram Stain  CYTOSPIN SMEAR NO WBC SEEN NO ORGANISMS SEEN   Final   Culture   Final    NO GROWTH 3 DAYS Performed at [facility], [place] [address], [place], [place] [address]    Report Status 11/19/2018 FINAL  Final    Please note: You were cared for by a hospitalist during your hospital stay. Once you are discharged, your primary care physician will handle any further medical issues. Please note that NO REFILLS for any discharge medications will be authorized once you are discharged, as it is imperative that you return to your primary care physician (or establish a relationship with a primary care physician if you do not have one) for your post hospital discharge needs so that they can reassess your need for medications and monitor your lab values.    Time coordinating discharge: 40 minutes  SIGNED:   Shelly Coss, MD  [facility] 11/21/2018, 1:40 PM Pager [phone]  If 7PM-7AM, please contact night-coverage [email or web address] Password [email or web address]

## 2018-11-21 NOTE — Progress Notes (Addendum)
NEUROLOGY PROGRESS NOTE  Subjective: Patient states that she does not feel any better at this point time.  She is able to walk with a walker.  She states that she is having burning sensations in her upper legs which are new.  Exam: Vitals:   11/21/18 0333 11/21/18 0751  BP: (!) 159/96 (!) 149/92  Pulse: 86 69  Resp: 18   Temp: 98.1 F (36.7 C) 98.5 F (36.9 C)  SpO2: 96% 99%    Physical Exam  HEENT-  Normocephalic, no lesions, without obvious abnormality.  Normal external eye and conjunctiva.  Extremities- Warm, dry and intact Musculoskeletal-no joint tenderness, deformity or swelling Skin-warm and dry, no hyperpigmentation, vitiligo, or suspicious lesions   Neuro:  Mental Status: Alert, oriented, thought content appropriate.  Speech fluent without evidence of aphasia.  Able to follow 3 step commands without difficulty. Cranial Nerves: II:  Visual fields grossly normal,  III,IV, VI: ptosis not present, extra-ocular motions intact bilaterally pupils equal, round, reactive to light and accommodation V,VII: smile symmetric, facial light touch sensation normal bilaterally VIII: hearing normal bilaterally IX,X: uvula rises midline XI: bilateral shoulder shrug XII: midline tongue extension Motor: Patient has 4+/5 strength bilateral upper extremities. 5/5 strength with hip flexion, knee extension, dorsi and plantar flexion bilaterally.  She does show 4/5 with knee flexion bilaterally. Sensory: Patient with decreased light touch sensation up to the level of T4. However, cold touch is within normal limits Deep Tendon Reflexes: 2+ and symmetric throughout upper extremities. No knee jerks or ankle jerks Plantars: Mute Cerebellar: Mild dysmetria with finger-nose     Medications:  Scheduled: . enoxaparin (LOVENOX) injection  40 mg Subcutaneous Q24H  . feeding supplement (ENSURE ENLIVE)  237 mL Oral BID BM  . Immune Globulin 10%  400 mg/kg Intravenous Q24 Hr x 5  . pantoprazole  40  mg Oral Daily   Continuous:   Pertinent Labs/Diagnostics:   Dg Chest 2 View  Result Date: 11/19/2018 CLINICAL DATA:  Chest tightness EXAM: CHEST - 2 VIEW COMPARISON:  November 11, 2018 FINDINGS: There are small pleural effusions bilaterally with bibasilar atelectasis. The lungs elsewhere are clear. Heart size and pulmonary vascularity are normal. No adenopathy. No pneumothorax. Patient is status post kyphoplasty at T12 for wedge fracture, stable. IMPRESSION: Small pleural effusions bilaterally with mild bibasilar atelectasis. No edema or consolidation. Stable cardiac silhouette. Electronically Signed   By: Lowella Grip III M.D.   On: 11/19/2018 16:56     Etta Quill PA-C [facility] [phone]   Assessment: 33 year old female with probable GBS 1. Receiving dose 5/5 of IVIG today. 2. B12 and folate are within normal limits. 3. Still awaiting thiamine level and anti-GQ1B antibody titer.  Recommendations: - If patient has no further improvement over a period of 2 weeks, she will likely need to come back and receive PLEX either as an outpatient or as an inpatient.  It would not be prudent to do it at this point as we would be removing all of the IVIG that has been infused. - Continue PT as per their recommendations  - Outpatient EMG/nerve conduction study in approximately 2 weeks - Outpatient neurology follow-up - At this time neurology will sign off   Electronically signed: Dr. Kerney Elbe 11/21/2018, 8:36 AM

## 2018-11-21 NOTE — Progress Notes (Signed)
Physical Therapy Treatment Patient Details Name: Kimberly [name] MRN: [id number] DOB: [birth year] Today's Date: 11/21/2018    History of Present Illness Pt is a 33 y/o female admitted secondary to bilateral LE weakness. 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 IVIG started 11/17/18. PMH including but not limited to alcohol abuse, tobacco abuse and ADD.    PT Comments    Pt making fair progress with functional mobility. She continues to demonstrate poor motor control, specifically eccentric motor control, with an ataxic gait pattern and genu recurvatum bilaterally throughout. Pt with one instance of bilateral knee buckling at beginning of ambulation that required max A x2 to recover from, no additional buckling noted after that. Pt would continue to benefit from skilled physical therapy services at this time while admitted and after d/c to address the below listed limitations in order to improve overall safety and independence with functional mobility.   Follow Up Recommendations  Home health PT     Equipment Recommendations  Rolling walker with 5" wheels;3in1 (PT)    Recommendations for Other Services       Precautions / Restrictions Precautions Precautions: Fall Restrictions Weight Bearing Restrictions: No Other Position/Activity Restrictions: LUE/LLE significantly weaker than R side.    Mobility  Bed Mobility Overal bed mobility: Modified Independent             General bed mobility comments: no physical assist needed.  Transfers Overall transfer level: Needs assistance Equipment used: Rolling walker (2 wheeled) Transfers: Sit to/from Stand Sit to Stand: Min guard         General transfer comment: min guard for safety; increased use of UE to obtain standing; poor eccentric control  Ambulation/Gait Ambulation/Gait assistance: Min guard;Max assist Gait Distance  (Feet): 100 Feet Assistive device: Rolling walker (2 wheeled) Gait Pattern/deviations: Step-through pattern;Decreased stride length;Ataxic Gait velocity: decreased   General Gait Details: pt with one significant LOB with bilateral knee buckling, requiring max A x2 to recover from; otherwise pt needing min guard for safety with RW for ambulation; she continues to demonstrate ataxia and poor motor control of bilateral LEs with genu recurvatum noted throughout   Stairs             Wheelchair Mobility    Modified Rankin (Stroke Patients Only)       Balance Overall balance assessment: Needs assistance Sitting-balance support: No upper extremity supported;Feet supported Sitting balance-Leahy Scale: Good     Standing balance support: Bilateral upper extremity supported;During functional activity Standing balance-Leahy Scale: Poor Standing balance comment: reliant on support                             Cognition Arousal/Alertness: Awake/alert Behavior During Therapy: WFL for tasks assessed/performed Overall Cognitive Status: Within Functional Limits for tasks assessed                                        Exercises General Exercises - Upper Extremity Shoulder Flexion: Strengthening;Both;5 reps;Seated;Theraband(focused on control to 90 only) Theraband Level (Shoulder Flexion): Level 3 (Green) Shoulder Extension: Strengthening;Both;5 reps;Seated;Theraband(focused on control to 90 only) Theraband Level (Shoulder Extension): Level 3 (Green) Elbow Flexion: Strengthening;Both;5 reps;Seated;Theraband(focused on control) Theraband Level (Elbow Flexion): Level 3 (Green) Elbow Extension: Strengthening;5 reps;Both;Seated;Theraband(focused on control) Theraband Level (Elbow Extension): Level 3 (  Green) General Exercises - Lower Extremity Long Arc Quad: AROM;Both;10 reps;Seated(with focus on eccentric control) Hip ABduction/ADduction:  Standing;AROM;Strengthening;Both;10 reps;Other (comment)(with bilateral UE supports on hand rail) Mini-Sqauts: AROM;Strengthening;Both;10 reps;Standing;Other (comment)(with bilateral UEs on RW)    General Comments        Pertinent Vitals/Pain Pain Assessment: Faces Faces Pain Scale: Hurts little more Pain Location: back, bilateral LEs Pain Descriptors / Indicators: Aching;Discomfort;Sore Pain Intervention(s): Monitored during session;Repositioned    Home Living                      Prior Function            PT Goals (current goals can now be found in the care plan section) Acute Rehab PT Goals Patient Stated Goal: to get stronger PT Goal Formulation: With patient Time For Goal Achievement: 12/01/18 Potential to Achieve Goals: Good Progress towards PT goals: Progressing toward goals    Frequency    Min 3X/week      PT Plan Current plan remains appropriate    Co-evaluation              AM-PAC PT "6 Clicks" Mobility   Outcome Measure  Help needed turning from your back to your side while in a flat bed without using bedrails?: None Help needed moving from lying on your back to sitting on the side of a flat bed without using bedrails?: None Help needed moving to and from a bed to a chair (including a wheelchair)?: None Help needed standing up from a chair using your arms (e.g., wheelchair or bedside chair)?: None Help needed to walk in hospital room?: A Little Help needed climbing 3-5 steps with a railing? : A Little 6 Click Score: 22    End of Session Equipment Utilized During Treatment: Gait belt Activity Tolerance: Patient tolerated treatment well Patient left: in bed;with call bell/phone within reach;with nursing/sitter in room Nurse Communication: Mobility status PT Visit Diagnosis: Other abnormalities of gait and mobility (R26.89);Muscle weakness (generalized) (M62.81)     Time: 1410-3013 PT Time Calculation (min) (ACUTE ONLY): 19  min  Charges:  $Gait Training: 8-22 mins                     Sherie Don, [place], DPT  Acute Rehabilitation Services Pager [phone] Office Parchment 11/21/2018, 11:42 AM

## 2018-11-22 ENCOUNTER — Telehealth: Payer: Self-pay | Admitting: Neurology

## 2018-11-22 DIAGNOSIS — R198 Other specified symptoms and signs involving the digestive system and abdomen: Secondary | ICD-10-CM | POA: Diagnosis not present

## 2018-11-22 DIAGNOSIS — K641 Second degree hemorrhoids: Secondary | ICD-10-CM | POA: Diagnosis not present

## 2018-11-22 DIAGNOSIS — K602 Anal fissure, unspecified: Secondary | ICD-10-CM | POA: Diagnosis not present

## 2018-11-22 NOTE — Telephone Encounter (Signed)
I spoke with the patient after I spoke with Dr. Jaynee Eagles and advised her that it is fine for her to keep her Feb 7th appt. If she gets worse she would need to go to the hospital for treatment that we cannot do in the office. The patient verbalized understanding. She stated that the hospital told her that if the numbness/tingling/pain from chest down didn't improve by the end of next week to go back and they would do the plasma exchange. Pt advised to continue with this plan and we will plan to see her Feb 7th @ 11:30. She verbalized appreciation.   Dr. Jaynee Eagles updated.

## 2018-11-22 NOTE — Telephone Encounter (Signed)
Pt was sent to the hosp and she was diagnosed with Gerion Baraye Syndrome. Soonest appt available was for Feb. 7 pt is needing to come in sooner. Please advise.

## 2018-11-23 DIAGNOSIS — M5135 Other intervertebral disc degeneration, thoracolumbar region: Secondary | ICD-10-CM | POA: Diagnosis not present

## 2018-11-23 DIAGNOSIS — Z87440 Personal history of urinary (tract) infections: Secondary | ICD-10-CM | POA: Diagnosis not present

## 2018-11-23 DIAGNOSIS — F419 Anxiety disorder, unspecified: Secondary | ICD-10-CM | POA: Diagnosis not present

## 2018-11-23 DIAGNOSIS — R202 Paresthesia of skin: Secondary | ICD-10-CM | POA: Diagnosis not present

## 2018-11-23 DIAGNOSIS — F909 Attention-deficit hyperactivity disorder, unspecified type: Secondary | ICD-10-CM | POA: Diagnosis not present

## 2018-11-23 LAB — VITAMIN B1: Vitamin B1 (Thiamine): 47.2 nmol/L — ABNORMAL LOW (ref 66.5–200.0)

## 2018-11-26 DIAGNOSIS — F419 Anxiety disorder, unspecified: Secondary | ICD-10-CM | POA: Diagnosis not present

## 2018-11-26 DIAGNOSIS — M5135 Other intervertebral disc degeneration, thoracolumbar region: Secondary | ICD-10-CM | POA: Diagnosis not present

## 2018-11-26 DIAGNOSIS — R202 Paresthesia of skin: Secondary | ICD-10-CM | POA: Diagnosis not present

## 2018-11-26 DIAGNOSIS — Z87440 Personal history of urinary (tract) infections: Secondary | ICD-10-CM | POA: Diagnosis not present

## 2018-11-26 DIAGNOSIS — F909 Attention-deficit hyperactivity disorder, unspecified type: Secondary | ICD-10-CM | POA: Diagnosis not present

## 2018-11-27 DIAGNOSIS — F909 Attention-deficit hyperactivity disorder, unspecified type: Secondary | ICD-10-CM | POA: Diagnosis not present

## 2018-11-27 DIAGNOSIS — M5135 Other intervertebral disc degeneration, thoracolumbar region: Secondary | ICD-10-CM | POA: Diagnosis not present

## 2018-11-27 DIAGNOSIS — Z87440 Personal history of urinary (tract) infections: Secondary | ICD-10-CM | POA: Diagnosis not present

## 2018-11-27 DIAGNOSIS — F419 Anxiety disorder, unspecified: Secondary | ICD-10-CM | POA: Diagnosis not present

## 2018-11-27 DIAGNOSIS — R202 Paresthesia of skin: Secondary | ICD-10-CM | POA: Diagnosis not present

## 2018-11-28 DIAGNOSIS — Z681 Body mass index (BMI) 19 or less, adult: Secondary | ICD-10-CM | POA: Diagnosis not present

## 2018-11-28 DIAGNOSIS — G610 Guillain-Barre syndrome: Secondary | ICD-10-CM | POA: Diagnosis not present

## 2018-11-28 DIAGNOSIS — G650 Sequelae of Guillain-Barre syndrome: Secondary | ICD-10-CM | POA: Diagnosis not present

## 2018-11-28 DIAGNOSIS — Z1389 Encounter for screening for other disorder: Secondary | ICD-10-CM | POA: Diagnosis not present

## 2018-11-29 ENCOUNTER — Encounter: Payer: BLUE CROSS/BLUE SHIELD | Admitting: Gastroenterology

## 2018-11-29 DIAGNOSIS — R202 Paresthesia of skin: Secondary | ICD-10-CM | POA: Diagnosis not present

## 2018-11-29 DIAGNOSIS — M5135 Other intervertebral disc degeneration, thoracolumbar region: Secondary | ICD-10-CM | POA: Diagnosis not present

## 2018-11-29 DIAGNOSIS — F419 Anxiety disorder, unspecified: Secondary | ICD-10-CM | POA: Diagnosis not present

## 2018-11-29 DIAGNOSIS — F909 Attention-deficit hyperactivity disorder, unspecified type: Secondary | ICD-10-CM | POA: Diagnosis not present

## 2018-11-29 DIAGNOSIS — Z87440 Personal history of urinary (tract) infections: Secondary | ICD-10-CM | POA: Diagnosis not present

## 2018-11-30 DIAGNOSIS — M5135 Other intervertebral disc degeneration, thoracolumbar region: Secondary | ICD-10-CM | POA: Diagnosis not present

## 2018-11-30 DIAGNOSIS — F419 Anxiety disorder, unspecified: Secondary | ICD-10-CM | POA: Diagnosis not present

## 2018-11-30 DIAGNOSIS — F909 Attention-deficit hyperactivity disorder, unspecified type: Secondary | ICD-10-CM | POA: Diagnosis not present

## 2018-11-30 DIAGNOSIS — Z87440 Personal history of urinary (tract) infections: Secondary | ICD-10-CM | POA: Diagnosis not present

## 2018-11-30 DIAGNOSIS — R202 Paresthesia of skin: Secondary | ICD-10-CM | POA: Diagnosis not present

## 2018-12-03 ENCOUNTER — Encounter (HOSPITAL_COMMUNITY): Payer: Self-pay | Admitting: Emergency Medicine

## 2018-12-03 ENCOUNTER — Other Ambulatory Visit: Payer: Self-pay

## 2018-12-03 ENCOUNTER — Emergency Department (HOSPITAL_COMMUNITY)
Admission: EM | Admit: 2018-12-03 | Discharge: 2018-12-04 | Disposition: A | Discharge status: Home / Self Care | Payer: BLUE CROSS/BLUE SHIELD | Attending: Emergency Medicine | Admitting: Emergency Medicine

## 2018-12-03 DIAGNOSIS — M79604 Pain in right leg: Secondary | ICD-10-CM | POA: Diagnosis not present

## 2018-12-03 DIAGNOSIS — R531 Weakness: Secondary | ICD-10-CM | POA: Diagnosis not present

## 2018-12-03 DIAGNOSIS — R200 Anesthesia of skin: Secondary | ICD-10-CM | POA: Insufficient documentation

## 2018-12-03 DIAGNOSIS — Z79899 Other long term (current) drug therapy: Secondary | ICD-10-CM | POA: Diagnosis not present

## 2018-12-03 DIAGNOSIS — M79661 Pain in right lower leg: Secondary | ICD-10-CM | POA: Diagnosis not present

## 2018-12-03 DIAGNOSIS — M79605 Pain in left leg: Secondary | ICD-10-CM | POA: Diagnosis not present

## 2018-12-03 DIAGNOSIS — F17210 Nicotine dependence, cigarettes, uncomplicated: Secondary | ICD-10-CM | POA: Insufficient documentation

## 2018-12-03 DIAGNOSIS — M79662 Pain in left lower leg: Secondary | ICD-10-CM | POA: Diagnosis not present

## 2018-12-03 NOTE — ED Triage Notes (Signed)
Pt. Stated, I was here on Jan 10 admitted for 6 days and they were not able to put an eat dx on me. Im still having the numbness in my legs and nerve pain.

## 2018-12-04 DIAGNOSIS — R202 Paresthesia of skin: Secondary | ICD-10-CM | POA: Diagnosis not present

## 2018-12-04 DIAGNOSIS — F909 Attention-deficit hyperactivity disorder, unspecified type: Secondary | ICD-10-CM | POA: Diagnosis not present

## 2018-12-04 DIAGNOSIS — F419 Anxiety disorder, unspecified: Secondary | ICD-10-CM | POA: Diagnosis not present

## 2018-12-04 DIAGNOSIS — Z87440 Personal history of urinary (tract) infections: Secondary | ICD-10-CM | POA: Diagnosis not present

## 2018-12-04 DIAGNOSIS — M5135 Other intervertebral disc degeneration, thoracolumbar region: Secondary | ICD-10-CM | POA: Diagnosis not present

## 2018-12-04 LAB — CBC WITH DIFFERENTIAL/PLATELET
Abs Immature Granulocytes: 0.02 10*3/uL (ref 0.00–0.07)
Basophils Absolute: 0 10*3/uL (ref 0.0–0.1)
Basophils Relative: 1 %
Eosinophils Absolute: 0.3 10*3/uL (ref 0.0–0.5)
Eosinophils Relative: 3 %
HCT: 43.6 % (ref 36.0–46.0)
Hemoglobin: 13.8 g/dL (ref 12.0–15.0)
IMMATURE GRANULOCYTES: 0 %
Lymphocytes Relative: 31 %
Lymphs Abs: 2.4 10*3/uL (ref 0.7–4.0)
MCH: 32.7 pg (ref 26.0–34.0)
MCHC: 31.7 g/dL (ref 30.0–36.0)
MCV: 103.3 fL — AB (ref 80.0–100.0)
Monocytes Absolute: 0.7 10*3/uL (ref 0.1–1.0)
Monocytes Relative: 9 %
Neutro Abs: 4.4 10*3/uL (ref 1.7–7.7)
Neutrophils Relative %: 56 %
Platelets: 297 10*3/uL (ref 150–400)
RBC: 4.22 MIL/uL (ref 3.87–5.11)
RDW: 14 % (ref 11.5–15.5)
WBC: 7.8 10*3/uL (ref 4.0–10.5)
nRBC: 0 % (ref 0.0–0.2)

## 2018-12-04 LAB — COMPREHENSIVE METABOLIC PANEL
ALT: 32 U/L (ref 0–44)
AST: 34 U/L (ref 15–41)
Albumin: 3.5 g/dL (ref 3.5–5.0)
Alkaline Phosphatase: 43 U/L (ref 38–126)
Anion gap: 10 (ref 5–15)
BUN: 12 mg/dL (ref 6–20)
CO2: 21 mmol/L — AB (ref 22–32)
Calcium: 9.2 mg/dL (ref 8.9–10.3)
Chloride: 103 mmol/L (ref 98–111)
Creatinine, Ser: 0.56 mg/dL (ref 0.44–1.00)
GFR calc Af Amer: >60 mL/min (ref >60)
GFR calc non Af Amer: >60 mL/min (ref >60)
Glucose, Bld: 108 mg/dL — ABNORMAL HIGH (ref 70–99)
POTASSIUM: 3.5 mmol/L (ref 3.5–5.1)
Sodium: 134 mmol/L — ABNORMAL LOW (ref 135–145)
Total Bilirubin: 0.7 mg/dL (ref 0.3–1.2)
Total Protein: 7.6 g/dL (ref 6.5–8.1)

## 2018-12-04 MED ORDER — OXYCODONE-ACETAMINOPHEN 5-325 MG PO TABS: 1.0000 | ORAL_TABLET | Freq: Four times a day (QID) | ORAL | 0 refills | Status: DC | PRN

## 2018-12-04 MED ORDER — OXYCODONE-ACETAMINOPHEN 5-325 MG PO TABS
1.0000 | ORAL_TABLET | Freq: Once | ORAL | Status: AC
  Administered 2018-12-04: 1 via ORAL
  Filled 2018-12-04: qty 1

## 2018-12-04 MED ORDER — GABAPENTIN 100 MG PO CAPS: ORAL_CAPSULE | ORAL | 0 refills | Status: DC

## 2018-12-04 NOTE — Discharge Instructions (Signed)
Increase your gabapentin as directed to 500 mg three times daily. Stop taking Norco and take oxycodone for pain as directed. Keep your neurology follow up appointment as scheduled.

## 2018-12-04 NOTE — ED Provider Notes (Signed)
[facility] EM[facility] DEPARTMENT Provider Note   CSN: [id number] Arrival date & time: 12/03/18  1607     History   Chief Complaint Chief Complaint  Patient presents with  . Extremity Weakness  . Leg Pain    HPI Kimberly [name] is a 33 y.o. female.  Patient to ED with complaint of bilateral LE pain. Recent admission (11/16/2018) for "Guillain Barre vs nutritional deficiency", discharged, discharged home on 11/21/2018 on gabapentin and Norco with plan to follow up in office, or return to the hospital if symptoms worsen for consideration of plasma exchange. She reports that her tests to evaluate nutrition status are "all normal".  She has been having increasing pain and was seen by her doctor 6 days ago who increased her gabapentin dose to 300 mg TID with the option to add another 100 mg if this was not effective, which she has done. She reports no relief with this and pain continues to get worse. She states the distribution of the numbness has not changed. She does not feel her LE extremities are any weaker or stronger. No fevers, vomiting, SOB or difficulty breathing. No chest pain. No incontinence.   The history is provided by the patient. No language interpreter was used.  Extremity Weakness  Pertinent negatives include no shortness of breath.  Leg Pain  Associated symptoms: no fever     Past Medical History:  Diagnosis Date  . Abnormal Pap smear   . ADD (attention deficit disorder)   . Anxiety   . Family history of anesthesia complication    mother has difficult  time going to sleep  . Headache(784.0)   . HSV-1 infection   . IBS (irritable bowel syndrome)   . Tachycardia     Patient Active Problem List   Diagnosis Date Noted  . AIDP (acute inflammatory demyelinating polyneuropathy) ([place]) 11/19/2018  . Weakness of both lower extremities 11/16/2018  . Hypokalemia 11/16/2018  . Anxiety 11/16/2018  . Acute pharyngitis 10/01/2013  . Depo-Provera  contraceptive status 05/17/2013  . Postpartum depression 10/09/2012  . Lactation suppression 10/09/2012  . Tachycardia 03/09/2012  . HSV-1 infection     Past Surgical History:  Procedure Laterality Date  . BREAST BIOPSY Right   . BREAST SURGERY     Biopsy-benign  . CESAREAN SECTION  09/13/2012   Procedure: CESAREAN SECTION;  Surgeon: Lavonia Drafts, MD;  Location: [facility];  Service: Obstetrics;  Laterality: N/A;  . KYPHOPLASTY N/A 08/27/2013   Procedure: Thoracic Twelve Kyphoplasty;  Surgeon: Erline Levine, MD;  Location: [facility];  Service: Neurosurgery;  Laterality: N/A;  T12 Kyphoplasty  . WISDOM TOOTH EXTRACTION     x4  . WRIST SURGERY Right 07-23-2013     OB History    Gravida  1   Para  1   Term  0   Preterm  1   AB  0   Living  2     SAB  0   TAB  0   Ectopic  0   Multiple  1   Live Births  2            Home Medications    Prior to Admission medications   Medication Sig Start Date End Date Taking? Authorizing Provider  Eluxadoline (VIBERZI) 75 MG TABS Take 1 tablet by mouth as needed.    [provider]  gabapentin (NEURONTIN) 100 MG capsule Take 1 capsule (100 mg total) by mouth 3 (three) times daily for 30  days. 11/21/18 12/21/18  Shelly Coss, MD  HYDROcodone-acetaminophen (NORCO/VICODIN) 5-325 MG tablet Take 1-2 tablets by mouth every 6 (six) hours as needed for moderate pain. 11/21/18   Shelly Coss, MD  hydrocortisone (ANUSOL-HC) 2.5 % rectal cream Place 1 application rectally 2 (two) times daily. Use for 14 days Patient taking differently: Place 1 application rectally as needed for hemorrhoids or anal itching.  10/19/18   Levin Erp, PA  ibuprofen (ADVIL,MOTRIN) 200 MG tablet Take 800 mg by mouth every 6 (six) hours as needed for mild pain or moderate pain.    [provider]  levonorgestrel (MIRENA) 20 MCG/24HR IUD 1 each by Intrauterine route once.    [provider]  loperamide (IMODIUM) 2  MG capsule Take by mouth as needed for diarrhea or loose stools.    [provider]  Multiple Vitamin (MULTIVITAMIN) tablet Take 1 tablet by mouth daily.    [provider]  ondansetron (ZOFRAN ODT) 4 MG disintegrating tablet Take 1 tablet (4 mg total) by mouth every 8 (eight) hours as needed for nausea or vomiting. 11/11/18   Tegeler, Gwenyth Allegra, MD  ondansetron (ZOFRAN) 4 MG tablet Take every 4-6 hours as needed. 10/19/18   Levin Erp, PA  pantoprazole (PROTONIX) 40 MG tablet TAKE 1 TABLET (40 MG TOTAL) BY MOUTH DAILY. TAKE 30-60 MINUTES BEFORE BREAKFAST. Patient taking differently: Take 40 mg by mouth daily.  11/12/18   Levin Erp, PA    Family History Family History  Problem Relation Age of Onset  . Depression Mother        chronic  . Hearing loss Mother   . Hyperlipidemia Mother   . Vision loss Mother        beachets disease  . COPD Father   . Diabetes Father   . Cancer Maternal Grandmother 44       breast cancer  . Parkinsonism Maternal Grandmother   . Heart disease Maternal Grandmother   . Breast cancer Maternal Grandmother   . Alzheimer's disease Paternal Grandmother   . Heart disease Paternal Grandfather        Died age 92  . Nephrolithiasis Paternal Grandfather   . Heart disease Maternal Grandfather   . Cancer Other 60       breast  . Breast cancer Other     Social History Social History   Tobacco Use  . Smoking status: Current Every Day Smoker    Packs/day: 0.50    Years: 10.00    Pack years: 5.00    Types: Cigarettes    Last attempt to quit: 01/31/2012    Years since quitting: 6.8  . Smokeless tobacco: Never Used  Substance Use Topics  . Alcohol use: Yes    Comment: occasionally  . Drug use: No     Allergies   Patient has no known allergies.   Review of Systems Review of Systems  Constitutional: Negative for chills and fever.  HENT: Negative.   Respiratory: Negative.  Negative for shortness of breath.     Cardiovascular: Negative.   Gastrointestinal: Negative.   Musculoskeletal: Positive for extremity weakness.       See HPI.  Skin: Negative.   Neurological: Positive for weakness and numbness.       Unchanged from previous.     Physical Exam Updated Vital Signs BP (!) 134/108 (BP Location: Left Arm)   Pulse (!) 108   Temp 98.3 F (36.8 C) (Oral)   Resp 14   SpO2 99%  Physical Exam Vitals signs and nursing note reviewed.  Constitutional:      Appearance: She is well-developed.  HENT:     Head: Normocephalic.  Neck:     Musculoskeletal: Normal range of motion and neck supple.  Cardiovascular:     Rate and Rhythm: Regular rhythm. Tachycardia present.     Heart sounds: No murmur.  Pulmonary:     Effort: Pulmonary effort is normal.     Breath sounds: Normal breath sounds. No wheezing, rhonchi or rales.  Chest:     Chest wall: No tenderness.  Abdominal:     General: Bowel sounds are normal.     Palpations: Abdomen is soft.     Tenderness: There is no abdominal tenderness. There is no guarding or rebound.  Musculoskeletal: Normal range of motion.     Comments: FROM all extremities without strength deficits.   Skin:    General: Skin is warm and dry.     Findings: No erythema.  Neurological:     Mental Status: She is alert and oriented to person, place, and time.     Comments: Reflexes are absent LE's and UE's.       ED Treatments / Results  Labs (all labs ordered are listed, but only abnormal results are displayed) Labs Reviewed  CBC WITH DIFFERENTIAL/PLATELET  COMPREHENSIVE METABOLIC PANEL   EKG None  Radiology No results found.  Procedures Procedures (including critical care time)  Medications Ordered in ED Medications  oxyCODONE-acetaminophen (PERCOCET/ROXICET) 5-325 MG per tablet 1 tablet (has no administration in time range)     Initial Impression / Assessment and Plan / ED Course  I have reviewed the triage vital signs and the nursing  notes.  Pertinent labs & imaging results that were available during my care of the patient were reviewed by me and considered in my medical decision making (see chart for details).     Patient to ED with bilateral LE pain, worse since recent admission for possible Guillian Barre vs other causing ascending numbness and LE weakness. She is reporting worsening pain without worsening numbness or weakness. She has scheduled outpatient neurology appointment with Dr. Jaynee Eagles on 12/14/2018.   Discussed with Dr. Lorraine Lax (neuro-hospitalist) who advises increasing gabapentin, adding Lyrica, additional pain medication. Without change to weakness or numbness, emergent plasma exchange is not indicated.   Discussed this plan with the patient and, again, confirmed that there is no change to weakness or numbness, only increased pain. Patient is comfortable with plan of discharge. Stressed the importance of keeping the neurology appointment for the 7th.  She is ambulated and is able to stand and transfer to wheelchair. She states this is her usual ability at home.   Will discharge home per above plan.  Final Clinical Impressions(s) / ED Diagnoses   Final diagnoses:  None   1. LE pain 2. Persistent numbness  ED Discharge Orders    None       Charlann Lange, Hershal Coria 12/04/18 [id number]    Ezequiel Essex, MD 12/04/18 [phone]

## 2018-12-05 ENCOUNTER — Other Ambulatory Visit: Payer: Self-pay | Admitting: Physician Assistant

## 2018-12-06 ENCOUNTER — Encounter (HOSPITAL_COMMUNITY): Payer: Self-pay | Admitting: Emergency Medicine

## 2018-12-06 ENCOUNTER — Inpatient Hospital Stay (HOSPITAL_COMMUNITY)
Admission: EM | Admit: 2018-12-06 | Discharge: 2018-12-09 | DRG: 641 | Disposition: A | Discharge status: Home Health | Payer: BLUE CROSS/BLUE SHIELD | Attending: Internal Medicine | Admitting: Internal Medicine

## 2018-12-06 ENCOUNTER — Emergency Department (HOSPITAL_COMMUNITY): Payer: BLUE CROSS/BLUE SHIELD

## 2018-12-06 DIAGNOSIS — Z720 Tobacco use: Secondary | ICD-10-CM | POA: Diagnosis not present

## 2018-12-06 DIAGNOSIS — F17290 Nicotine dependence, other tobacco product, uncomplicated: Secondary | ICD-10-CM | POA: Diagnosis not present

## 2018-12-06 DIAGNOSIS — R000 Tachycardia, unspecified: Secondary | ICD-10-CM | POA: Diagnosis not present

## 2018-12-06 DIAGNOSIS — F17210 Nicotine dependence, cigarettes, uncomplicated: Secondary | ICD-10-CM | POA: Diagnosis not present

## 2018-12-06 DIAGNOSIS — K219 Gastro-esophageal reflux disease without esophagitis: Secondary | ICD-10-CM

## 2018-12-06 DIAGNOSIS — E519 Thiamine deficiency, unspecified: Principal | ICD-10-CM | POA: Diagnosis present

## 2018-12-06 DIAGNOSIS — R29898 Other symptoms and signs involving the musculoskeletal system: Secondary | ICD-10-CM | POA: Diagnosis not present

## 2018-12-06 DIAGNOSIS — G629 Polyneuropathy, unspecified: Secondary | ICD-10-CM | POA: Diagnosis not present

## 2018-12-06 DIAGNOSIS — Z793 Long term (current) use of hormonal contraceptives: Secondary | ICD-10-CM | POA: Diagnosis not present

## 2018-12-06 DIAGNOSIS — Z818 Family history of other mental and behavioral disorders: Secondary | ICD-10-CM | POA: Diagnosis not present

## 2018-12-06 DIAGNOSIS — F988 Other specified behavioral and emotional disorders with onset usually occurring in childhood and adolescence: Secondary | ICD-10-CM | POA: Diagnosis not present

## 2018-12-06 DIAGNOSIS — R739 Hyperglycemia, unspecified: Secondary | ICD-10-CM

## 2018-12-06 DIAGNOSIS — K582 Mixed irritable bowel syndrome: Secondary | ICD-10-CM

## 2018-12-06 DIAGNOSIS — Z9109 Other allergy status, other than to drugs and biological substances: Secondary | ICD-10-CM | POA: Diagnosis not present

## 2018-12-06 DIAGNOSIS — K5903 Drug induced constipation: Secondary | ICD-10-CM

## 2018-12-06 DIAGNOSIS — H532 Diplopia: Secondary | ICD-10-CM | POA: Diagnosis not present

## 2018-12-06 DIAGNOSIS — K589 Irritable bowel syndrome without diarrhea: Secondary | ICD-10-CM | POA: Diagnosis not present

## 2018-12-06 DIAGNOSIS — E5111 Dry beriberi: Secondary | ICD-10-CM | POA: Diagnosis not present

## 2018-12-06 DIAGNOSIS — Z975 Presence of (intrauterine) contraceptive device: Secondary | ICD-10-CM

## 2018-12-06 DIAGNOSIS — F419 Anxiety disorder, unspecified: Secondary | ICD-10-CM | POA: Diagnosis present

## 2018-12-06 DIAGNOSIS — E872 Acidosis, unspecified: Secondary | ICD-10-CM

## 2018-12-06 DIAGNOSIS — G8310 Monoplegia of lower limb affecting unspecified side: Secondary | ICD-10-CM | POA: Diagnosis not present

## 2018-12-06 DIAGNOSIS — R0602 Shortness of breath: Secondary | ICD-10-CM | POA: Diagnosis not present

## 2018-12-06 DIAGNOSIS — Z79899 Other long term (current) drug therapy: Secondary | ICD-10-CM

## 2018-12-06 DIAGNOSIS — K5900 Constipation, unspecified: Secondary | ICD-10-CM

## 2018-12-06 DIAGNOSIS — R296 Repeated falls: Secondary | ICD-10-CM | POA: Diagnosis present

## 2018-12-06 HISTORY — DX: Hyperglycemia, unspecified: R73.9

## 2018-12-06 HISTORY — DX: Acidosis, unspecified: E87.20

## 2018-12-06 LAB — URINALYSIS, ROUTINE W REFLEX MICROSCOPIC
BILIRUBIN URINE: NEGATIVE
GLUCOSE, UA: NEGATIVE mg/dL
Ketones, ur: NEGATIVE mg/dL
Leukocytes, UA: NEGATIVE
Nitrite: NEGATIVE
Protein, ur: NEGATIVE mg/dL
Specific Gravity, Urine: 1.012 (ref 1.005–1.030)
pH: 5 (ref 5.0–8.0)

## 2018-12-06 LAB — CBC WITH DIFFERENTIAL/PLATELET
Abs Immature Granulocytes: 0.02 10*3/uL (ref 0.00–0.07)
Basophils Absolute: 0 10*3/uL (ref 0.0–0.1)
Basophils Relative: 1 %
Eosinophils Absolute: 0.2 10*3/uL (ref 0.0–0.5)
Eosinophils Relative: 3 %
HCT: 46.1 % — ABNORMAL HIGH (ref 36.0–46.0)
HEMOGLOBIN: 15.1 g/dL — AB (ref 12.0–15.0)
IMMATURE GRANULOCYTES: 0 %
Lymphocytes Relative: 28 %
Lymphs Abs: 2.2 10*3/uL (ref 0.7–4.0)
MCH: 33.3 pg (ref 26.0–34.0)
MCHC: 32.8 g/dL (ref 30.0–36.0)
MCV: 101.8 fL — ABNORMAL HIGH (ref 80.0–100.0)
Monocytes Absolute: 0.7 10*3/uL (ref 0.1–1.0)
Monocytes Relative: 9 %
Neutro Abs: 4.7 10*3/uL (ref 1.7–7.7)
Neutrophils Relative %: 59 %
Platelets: 313 10*3/uL (ref 150–400)
RBC: 4.53 MIL/uL (ref 3.87–5.11)
RDW: 13.7 % (ref 11.5–15.5)
WBC: 7.8 10*3/uL (ref 4.0–10.5)
nRBC: 0 % (ref 0.0–0.2)

## 2018-12-06 LAB — COMPREHENSIVE METABOLIC PANEL
ALT: 35 U/L (ref 0–44)
AST: 37 U/L (ref 15–41)
Albumin: 3.9 g/dL (ref 3.5–5.0)
Alkaline Phosphatase: 45 U/L (ref 38–126)
Anion gap: 11 (ref 5–15)
BUN: 7 mg/dL (ref 6–20)
CO2: 17 mmol/L — ABNORMAL LOW (ref 22–32)
Calcium: 9.7 mg/dL (ref 8.9–10.3)
Chloride: 108 mmol/L (ref 98–111)
Creatinine, Ser: 0.59 mg/dL (ref 0.44–1.00)
GFR calc Af Amer: >60 mL/min (ref >60)
GFR calc non Af Amer: >60 mL/min (ref >60)
Glucose, Bld: 124 mg/dL — ABNORMAL HIGH (ref 70–99)
Potassium: 3.9 mmol/L (ref 3.5–5.1)
Sodium: 136 mmol/L (ref 135–145)
Total Bilirubin: 0.5 mg/dL (ref 0.3–1.2)
Total Protein: 8.1 g/dL (ref 6.5–8.1)

## 2018-12-06 LAB — CBG MONITORING, ED: Glucose-Capillary: 125 mg/dL — ABNORMAL HIGH (ref 70–99)

## 2018-12-06 LAB — MAGNESIUM: Magnesium: 1.8 mg/dL (ref 1.7–2.4)

## 2018-12-06 LAB — POC URINE PREG, ED: Preg Test, Ur: NEGATIVE

## 2018-12-06 LAB — TSH: TSH: 1.618 u[IU]/mL (ref 0.350–4.500)

## 2018-12-06 LAB — PHOSPHORUS: Phosphorus: 4.9 mg/dL — ABNORMAL HIGH (ref 2.5–4.6)

## 2018-12-06 MED ORDER — ONDANSETRON HCL 4 MG/2ML IJ SOLN: 4.0000 mg | Freq: Four times a day (QID) | INTRAMUSCULAR | Status: DC | PRN

## 2018-12-06 MED ORDER — ONDANSETRON HCL 4 MG PO TABS: 4.0000 mg | ORAL_TABLET | Freq: Four times a day (QID) | ORAL | Status: DC | PRN

## 2018-12-06 MED ORDER — SODIUM CHLORIDE 0.9 % IV SOLN
INTRAVENOUS | Status: DC
  Administered 2018-12-06 – 2018-12-07 (×2): via INTRAVENOUS

## 2018-12-06 MED ORDER — MORPHINE SULFATE 15 MG PO TABS
15.0000 mg | ORAL_TABLET | Freq: Once | ORAL | Status: AC
  Administered 2018-12-06: 15 mg via ORAL
  Filled 2018-12-06: qty 1

## 2018-12-06 MED ORDER — OXYCODONE-ACETAMINOPHEN 5-325 MG PO TABS
1.0000 | ORAL_TABLET | Freq: Four times a day (QID) | ORAL | Status: DC | PRN
  Administered 2018-12-06 – 2018-12-09 (×9): 1 via ORAL
  Filled 2018-12-06 (×9): qty 1

## 2018-12-06 MED ORDER — THIAMINE HCL 100 MG/ML IJ SOLN
500.0000 mg | Freq: Three times a day (TID) | INTRAMUSCULAR | Status: DC
  Administered 2018-12-06: 500 mg via INTRAVENOUS
  Filled 2018-12-06 (×2): qty 6

## 2018-12-06 MED ORDER — ENOXAPARIN SODIUM 40 MG/0.4ML ~~LOC~~ SOLN
40.0000 mg | SUBCUTANEOUS | Status: DC
  Administered 2018-12-06 – 2018-12-08 (×3): 40 mg via SUBCUTANEOUS
  Filled 2018-12-06 (×3): qty 0.4

## 2018-12-06 MED ORDER — ADULT MULTIVITAMIN W/MINERALS CH
1.0000 | ORAL_TABLET | Freq: Every day | ORAL | Status: DC
  Administered 2018-12-07 – 2018-12-09 (×3): 1 via ORAL
  Filled 2018-12-06 (×4): qty 1

## 2018-12-06 MED ORDER — ALBUTEROL SULFATE (2.5 MG/3ML) 0.083% IN NEBU: 2.5000 mg | INHALATION_SOLUTION | RESPIRATORY_TRACT | Status: DC | PRN

## 2018-12-06 MED ORDER — THIAMINE HCL 100 MG/ML IJ SOLN
500.0000 mg | Freq: Three times a day (TID) | INTRAMUSCULAR | Status: DC
  Administered 2018-12-06: 500 mg via INTRAVENOUS
  Filled 2018-12-06: qty 6

## 2018-12-06 MED ORDER — ACETAMINOPHEN 650 MG RE SUPP: 650.0000 mg | Freq: Four times a day (QID) | RECTAL | Status: DC | PRN

## 2018-12-06 MED ORDER — PANTOPRAZOLE SODIUM 40 MG PO TBEC
40.0000 mg | DELAYED_RELEASE_TABLET | Freq: Every day | ORAL | Status: DC
  Administered 2018-12-07 – 2018-12-09 (×3): 40 mg via ORAL
  Filled 2018-12-06 (×4): qty 1

## 2018-12-06 MED ORDER — ELUXADOLINE 75 MG PO TABS: 75.0000 mg | ORAL_TABLET | Freq: Every day | ORAL | Status: DC | PRN

## 2018-12-06 MED ORDER — GABAPENTIN 400 MG PO CAPS
500.0000 mg | ORAL_CAPSULE | Freq: Three times a day (TID) | ORAL | Status: DC
  Administered 2018-12-06 – 2018-12-09 (×8): 500 mg via ORAL
  Filled 2018-12-06 (×8): qty 1

## 2018-12-06 MED ORDER — CALCIUM CARBONATE ANTACID 500 MG PO CHEW
1.0000 | CHEWABLE_TABLET | ORAL | Status: DC | PRN
  Filled 2018-12-06: qty 2

## 2018-12-06 MED ORDER — BISACODYL 10 MG RE SUPP: 10.0000 mg | Freq: Every day | RECTAL | Status: DC | PRN

## 2018-12-06 MED ORDER — POLYETHYLENE GLYCOL 3350 17 G PO PACK
17.0000 g | PACK | Freq: Every day | ORAL | Status: DC | PRN
  Administered 2018-12-06 – 2018-12-09 (×4): 17 g via ORAL
  Filled 2018-12-06 (×4): qty 1

## 2018-12-06 MED ORDER — IBUPROFEN 200 MG PO TABS
800.0000 mg | ORAL_TABLET | Freq: Four times a day (QID) | ORAL | Status: DC | PRN
  Administered 2018-12-07 – 2018-12-09 (×4): 800 mg via ORAL
  Filled 2018-12-06 (×4): qty 4

## 2018-12-06 MED ORDER — ONDANSETRON HCL 4 MG PO TABS: 4.0000 mg | ORAL_TABLET | ORAL | Status: DC

## 2018-12-06 MED ORDER — ACETAMINOPHEN 325 MG PO TABS: 650.0000 mg | ORAL_TABLET | Freq: Four times a day (QID) | ORAL | Status: DC | PRN

## 2018-12-06 MED ORDER — GABAPENTIN 400 MG PO CAPS
500.0000 mg | ORAL_CAPSULE | Freq: Once | ORAL | Status: AC
  Administered 2018-12-06: 500 mg via ORAL
  Filled 2018-12-06: qty 1

## 2018-12-06 NOTE — Progress Notes (Signed)
NIF greater than -40 x 2 VC 2.5 L x 2   Good effort.

## 2018-12-06 NOTE — ED Triage Notes (Signed)
Pt was called by Dr. Lorraine Lax last night and told to return today for low B1 level-- was being worked up for [employer] by Neuro. Instructed to call Neuro on arrival.

## 2018-12-06 NOTE — H&P (Signed)
History and Physical    Kimberly [name] NWG:[id number] DOB: 06/19/1986 DOA: 12/06/2018  PCP: [name], MD   Patient coming from: Home  Chief Complaint: Falls, Leg Weakness, Pain, and Bilateral Leg Numbness; "B1 Levels"  HPI: Kimberly [name] is a 33 y.o. female with medical history significant of ADD, Anxiety, IBS, GERD, Tachycardia, history of hypokalemia, and other comorbidities presents with similar symptoms of when she was discharged last with a sending numbness, lower extremity weakness and ataxia as well as frequent falls.  She was called by the neurologist and told her to go to her PCP for B1 replacement as her B1 level was severely low.  Recently she was hospitalized for concern for Guyon Barr versus nutritional deficiency and she was given 5 days of IVIG.  Symptoms had not improved significantly and she reports numbness of her body below her chest.  PT evaluated and recommended home health PT and neurology had cleared her for discharge with outpatient follow-up will go for neurology at that time.  Patient states that the numbness started January 2 and progressively worsened but is not worse than when she received IVIG.  Since her hospitalization she is fallen at least 5-6 times and ambulates with a walker and a wheelchair sometimes.  Her main complaint has now been lower extremity pain that has been shooting and burning.  She states that she takes gabapentin for this.  Recently talked with neurology who recommended her to go to the her PCP for thiamine replacement given her B12 levels but since PCP is unable to do this she was referred to the ED for admission.  Patient states that she denies any chest pain, lightheadedness or dizziness.  Has no nausea or vomiting but does have a history of irritable bowel syndrome and recently has been constipated given her use of opioids.  [facility] was called to admit this patient for admission for her bilateral lower extremity weakness and numbness with  frequent falls with likely dry beriberi.  ED Course: In the ED she had basic blood work done and was given 500 mg of IV thiamine and 500 mg of p.o. gabapentin.  She also had a chest x-ray and a Kniffen vital capacity done.  Review of Systems: As per HPI otherwise 10 point review of systems negative.   Past Medical History:  Diagnosis Date  . Abnormal Pap smear   . ADD (attention deficit disorder)   . Anxiety   . Family history of anesthesia complication    mother has difficult  time going to sleep  . Headache(784.0)   . HSV-1 infection   . IBS (irritable bowel syndrome)   . Tachycardia    Past Surgical History:  Procedure Laterality Date  . BREAST BIOPSY Right   . BREAST SURGERY     Biopsy-benign  . CESAREAN SECTION  09/13/2012   Procedure: CESAREAN SECTION;  Surgeon: Lavonia Drafts, MD;  Location: [facility];  Service: Obstetrics;  Laterality: N/A;  . KYPHOPLASTY N/A 08/27/2013   Procedure: Thoracic Twelve Kyphoplasty;  Surgeon: Erline Levine, MD;  Location: [facility];  Service: Neurosurgery;  Laterality: N/A;  T12 Kyphoplasty  . WISDOM TOOTH EXTRACTION     x4  . WRIST SURGERY Right 07-23-2013   SOCIAL HISTORY  reports that she has been smoking cigarettes. She has a 5.00 pack-year smoking history. She has never used smokeless tobacco. She reports current alcohol use. She reports that she does not use drugs. Has been using the E-Cigarettes  Allergies  Allergen  Reactions  . Tape Itching and Other (See Comments)    Clear, "plastic" tape is the culprit   Family History  Problem Relation Age of Onset  . Depression Mother        chronic  . Hearing loss Mother   . Hyperlipidemia Mother   . Vision loss Mother        beachets disease  . COPD Father   . Diabetes Father   . Cancer Maternal Grandmother 34       breast cancer  . Parkinsonism Maternal Grandmother   . Heart disease Maternal Grandmother   . Breast cancer Maternal Grandmother   . Alzheimer's disease Paternal  Grandmother   . Heart disease Paternal Grandfather        Died age 11  . Nephrolithiasis Paternal Grandfather   . Heart disease Maternal Grandfather   . Cancer Other 60       breast  . Breast cancer Other    Prior to Admission medications   Medication Sig Start Date End Date Taking? Authorizing Provider  calcium carbonate (TUMS EX) 750 MG chewable tablet Chew 1-2 tablets by mouth as needed (for heartburn or indigestion).   Yes [provider]  Eluxadoline (VIBERZI) 75 MG TABS Take 75 mg by mouth daily as needed (for symptoms- diarrhea).    Yes [provider]  gabapentin (NEURONTIN) 100 MG capsule Increase your total gabapentin dose to 500 mg three times a day. Patient taking differently: Take 500 mg by mouth 3 (three) times daily.  12/04/18  Yes Upstill, Nehemiah Settle, PA-C  hydrocortisone (ANUSOL-HC) 2.5 % rectal cream Place 1 application rectally 2 (two) times daily. Use for 14 days Patient taking differently: Place 1 application rectally as needed for hemorrhoids or anal itching.  10/19/18  Yes Levin Erp, PA  ibuprofen (ADVIL,MOTRIN) 200 MG tablet Take 800 mg by mouth every 6 (six) hours as needed for mild pain or moderate pain.   Yes [provider]  levonorgestrel (MIRENA) 20 MCG/24HR IUD 1 each by Intrauterine route once.   Yes [provider]  loperamide (IMODIUM) 2 MG capsule Take 2 mg by mouth as needed for diarrhea or loose stools.    Yes [provider]  Multiple Vitamin (MULTIVITAMIN) tablet Take 1 tablet by mouth daily.   Yes [provider]  ondansetron (ZOFRAN) 4 MG tablet Take every 4-6 hours as needed. Patient taking differently: Take 4 mg by mouth See admin instructions. Take 4 mg by mouth every 4-6 hours as needed for nausea or vomiting 10/19/18  Yes Levin Erp, PA  oxyCODONE-acetaminophen (PERCOCET/ROXICET) 5-325 MG tablet Take 1 tablet by mouth every 6 (six) hours as needed for severe pain. 12/04/18  Yes  Upstill, Shari, PA-C  pantoprazole (PROTONIX) 40 MG tablet TAKE 1 TABLET (40 MG TOTAL) BY MOUTH DAILY. TAKE 30-60 MINUTES BEFORE BREAKFAST. Patient taking differently: Take 40 mg by mouth daily before breakfast.  12/06/18  Yes Lemmon, Lavone Nian, PA  polyethylene glycol (MIRALAX / GLYCOLAX) packet Take 17 g by mouth daily as needed for mild constipation.   Yes [provider]  HYDROcodone-acetaminophen (NORCO/VICODIN) 5-325 MG tablet Take 1-2 tablets by mouth every 6 (six) hours as needed for moderate pain. 11/21/18   Shelly Coss, MD  ondansetron (ZOFRAN ODT) 4 MG disintegrating tablet Take 1 tablet (4 mg total) by mouth every 8 (eight) hours as needed for nausea or vomiting. Patient not taking: Reported on 12/06/2018 11/11/18   Tegeler, Gwenyth Allegra, MD   Physical  Exam: Vitals:   12/06/18 1349 12/06/18 1615  BP: (!) 154/112   Pulse: (!) 106 (!) 105  Resp: 18   Temp: 98.2 F (36.8 C)   TempSrc: Oral   SpO2: 99% 100%   Constitutional: WN/WD Caucasian female in NAD and appears calm and comfortable Eyes: Llids and conjunctivae normal, sclerae anicteric  ENMT: External Ears, Nose appear normal. Grossly normal hearing.  Neck: Appears normal, supple, no cervical masses, normal ROM, no appreciable thyromegaly; no JVD Respiratory: Diminished slightly to auscultation bilaterally, no wheezing, rales, rhonchi or crackles. Normal respiratory effort and patient is not tachypenic. No accessory muscle use.  Cardiovascular: Tachycardic Rate but regular rhythm, no murmurs / rubs / gallops. S1 and S2 auscultated. No extremity edema. = Abdomen: Soft, non-tender, non-distended. No masses palpated. No appreciable hepatosplenomegaly. Bowel sounds positive x4.  GU: Deferred. Musculoskeletal: No clubbing / cyanosis of digits/nails. No joint deformity upper and lower extremities. Diminished strength in the LE.  Skin: No rashes, lesions, ulcers on a limited skin evaluation. No induration; Warm and  dry.  Neurologic: CN 2-12 grossly intact with no focal deficits. Sensation diminished significantly 4 Extremities, DTR are altered and diminished. Romberg sign and cerebellar reflexes not assessed.  Psychiatric: Normal judgment and insight. Alert and oriented x 3. Normal mood and appropriate affect.   Labs on Admission: I have personally reviewed following labs and imaging studies  CBC: Recent Labs  Lab 12/04/18 0130 12/06/18 1601  WBC 7.8 7.8  NEUTROABS 4.4 4.7  HGB 13.8 15.1*  HCT 43.6 46.1*  MCV 103.3* 101.8*  PLT 297 867   Basic Metabolic Panel: Recent Labs  Lab 12/04/18 0130 12/06/18 1601  NA 134* 136  K 3.5 3.9  CL 103 108  CO2 21* 17*  GLUCOSE 108* 124*  BUN 12 7  CREATININE 0.56 0.59  CALCIUM 9.2 9.7  MG  --  1.8  PHOS  --  4.9*   GFR: CrCl cannot be calculated (Unknown ideal weight.). Liver Function Tests: Recent Labs  Lab 12/04/18 0130 12/06/18 1601  AST 34 37  ALT 32 35  ALKPHOS 43 45  BILITOT 0.7 0.5  PROT 7.6 8.1  ALBUMIN 3.5 3.9   No results for input(s): LIPASE, AMYLASE in the last 168 hours. No results for input(s): AMMONIA in the last 168 hours. Coagulation Profile: No results for input(s): INR, PROTIME in the last 168 hours. Cardiac Enzymes: No results for input(s): CKTOTAL, CKMB, CKMBINDEX, TROPONINI in the last 168 hours. BNP (last 3 results) No results for input(s): PROBNP in the last 8760 hours. HbA1C: No results for input(s): HGBA1C in the last 72 hours. CBG: Recent Labs  Lab 12/06/18 1625  GLUCAP 125*   Lipid Profile: No results for input(s): CHOL, HDL, LDLCALC, TRIG, CHOLHDL, LDLDIRECT in the last 72 hours. Thyroid Function Tests: No results for input(s): TSH, T4TOTAL, FREET4, T3FREE, THYROIDAB in the last 72 hours. Anemia Panel: No results for input(s): VITAMINB12, FOLATE, FERRITIN, TIBC, IRON, RETICCTPCT in the last 72 hours. Urine analysis:    Component Value Date/Time   COLORURINE YELLOW 12/06/2018 1629    APPEARANCEUR HAZY (A) 12/06/2018 1629   LABSPEC 1.012 12/06/2018 1629   PHURINE 5.0 12/06/2018 1629   GLUCOSEU NEGATIVE 12/06/2018 1629   HGBUR SMALL (A) 12/06/2018 1629   BILIRUBINUR NEGATIVE 12/06/2018 1629   KETONESUR NEGATIVE 12/06/2018 1629   PROTEINUR NEGATIVE 12/06/2018 1629   UROBILINOGEN 0.2 06/03/2014 1311   NITRITE NEGATIVE 12/06/2018 1629   LEUKOCYTESUR NEGATIVE 12/06/2018 1629   Sepsis Labs: !!!!!!!!!!!!!!!!!!!!!!!!!!!!!!!!!!!!!!!!!!!! @  LABRCNTIP(procalcitonin:4,lacticidven:4) )No results found for this or any previous visit (from the past 240 hour(s)).   Radiological Exams on Admission: Dg Chest Portable 1 View  Result Date: 12/06/2018 CLINICAL DATA:  Tachycardia.  Lower extremity edema. EXAM: PORTABLE CHEST 1 VIEW COMPARISON:  Radiographs of November 19, 2018. FINDINGS: The heart size and mediastinal contours are within normal limits. Both lungs are clear. The visualized skeletal structures are unremarkable. IMPRESSION: No active disease. Electronically Signed   By: Marijo Conception, M.D.   On: 12/06/2018 16:25   EKG: No EKG done on admission so will order one now.  Assessment/Plan Active Problems:   Tachycardia   Weakness of both lower extremities   Anxiety   Dry beriberi   Tobacco abuse   Hyperglycemia   Metabolic acidosis   IBS (irritable bowel syndrome)   Constipation   GERD (gastroesophageal reflux disease)  Dry Beriberi vs. Guillain Barre Syndrome but likley Dry Beriberi -Admit to Inpatient -Known well to the Neurology Service  -Recently admitted for weakness and numbness or concern for Guillian-Barr and was started on IVIG for 5 days of therapy -Further work-up reveals that she has severe thiamine deficiency -Neurology was planning on doing plasma exchange but called the patient and told her to go to her PCP for IV thiamine replacement however PCP is unable to do this -Vitamin B1 Level is 47.2 -Patient was directed to the ED -Patient has a follow-up  appointment with Dr. Lavell Anchors of neurology and plan is for outpatient EMG nerve conduction study -I discussed the case with neurology Dr. Rory Percy who recommends 3 days of 500 mg of IV thiamine 3 times daily as there is less concern of Guillain-Barr given her recent evaluation by the Neurology team last hospitalization -NIF done on admission was -40 and vital capacity was 2.5 L -Resume Gabapentin 500 mg TID for Neuropathic Pain -Resume Home Pain Regimen with Oxycodone; Was given 15 mg po of Morphine in the ED  -PT/OT evaluate and treat  -Placed on a Regular Diet -Patient does have some numbness and tachycardia as well as difficulty walking and she has been falling multiple times -Neurology will not formally consult but if symptoms worsen or change we will have a low threshold to give them a call  Tobacco Abuse -Smoking Cessastion Counseling given  -Refused nicotine replacement  Hyperglycemia  -HbA1c recently was 4.9  -Likely reactive we will continue monitor  Tachycardia -States ranges from 100-125 normally -Possibly related to thiamine deficiency -States that she has a follow-up appointment with cardiology when she gets out of the hospital cardiology is going to start her on a beta-blocker for longstanding tachycardia -Continue to monitor on telemetry  Metabolic Acidosis -CO2 was 17; Unclear Etiology  -C/w IVF hydration with NS at 75 mL/hr x1 day -Repeat CMP in the AM  IBS -C/w Eluxadoline 75 mg prn for symptoms of diarreha -C/w Stool Softness and Miralax for Constipation symptoms  Constipation -Provided Stool Softeners and Miralax -Likely in the setting of Opioid Use  GERD -C/w Pantoprazole 40 mg po [place]   DVT prophylaxis: Lovenox 40 mg sq q24h Code Status: FULL CODE  Family Communication: No family present at bedside  Disposition Plan:  Consults called: Discussed with Neurology Admission status: Inpatient Telemetry  Severity of Illness: The appropriate patient status  for this patient is INPATIENT. Inpatient status is judged to be reasonable and necessary in order to provide the required intensity of service to ensure the patient's safety. The patient's presenting symptoms, physical exam findings,  and initial radiographic and laboratory data in the context of their chronic comorbidities is felt to place them at high risk for further clinical deterioration. Furthermore, it is not anticipated that the patient will be medically stable for discharge from the hospital within 2 midnights of admission. The following factors support the patient status of inpatient.   " The patient's presenting symptoms include LE Weakness, Numbness, Pain, Decreased Sensation and Falls. " The worrisome physical exam findings include Altered LE Sensation and Diminished reflexes. " The initial radiographic and laboratory data are worrisome because of Metabolic Acidosis and Low Thiamine Level. " The chronic co-morbidities include are as above.  * I certify that at the point of admission it is my clinical judgment that the patient will require inpatient hospital care spanning beyond 2 midnights from the point of admission due to high intensity of service, high risk for further deterioration and high frequency of surveillance required.Kerney Elbe, D.O. [facility] PAGER is on [place]  If 7PM-7AM, please contact night-coverage [email or web address] Password [facility]  12/06/2018, 5:33 PM

## 2018-12-06 NOTE — ED Notes (Signed)
Called pt back for exam room. No response.

## 2018-12-06 NOTE — Plan of Care (Signed)
Pt is alert and oriented X4. C/O pain, numbness and tingling sensation to bilateral lower extremities and bilateral hands. Pain med was given with some relief. Skin warm and dry. Ambulates to the bathroom with rolling walker. No acute respiratory distress noted at this time. Will continue monitoring.

## 2018-12-06 NOTE — Progress Notes (Signed)
Brief note Dr. Lorraine Lax overnight signed out that patient's labs revealed low vitamin B1 (thiamine) levels at 47.2.  Spoke with the patient and informed her.  He also recommended IV supplementation. After his shift, he spoke with the patient's primary care provider to see if outpatient IV supplementation of thiamine-500 mg IV 3 times daily x3 days can be done.   Primary care was unable to provide the IV treatment and Dr. Lorraine Lax requested patient come back to the ER and get admitted for high-dose thiamine treatment. From a neurological standpoint, the patient needs admission to medicine and 3 days of 500 mg IV thiamine 3 times daily. No new neurological interventions are planned at this time. She should follow-up with outpatient neurology for EMG nerve conduction studies in 2 to 4 weeks.  Please call neurology on call with questions   -- Amie Portland, MD [facility] Pager: [phone] If 7pm to 7am, please call on call as listed on AMION.

## 2018-12-06 NOTE — ED Provider Notes (Signed)
[facility] EMERGENCY DEPARTMENT Provider Note   CSN: [id number] Arrival date & time: 12/06/18  1346     History   Chief Complaint Chief Complaint  Patient presents with  . abnormal labs    HPI Kimberly [name] is a 33 y.o. female.  HPI   Kimberly [name] is a 33 y.o. female with PMH of abnormal Pap smear, ADD, anxiety, headache, HSV-1, IBS, tachycardia, hypokalemia who presents with several weeks of nausea and vomiting with diarrhea, lower extremity weakness, pain in the bilateral lower extremities, and numbness up to her mid chest.  She reports about 3 months ago she developing nausea with emesis after eating, several days with watery diarrhea per day, and already has history of chronic diarrhea.  This was beyond her baseline.  Has a history of anal fissures and bleeding hemorrhoids but is not sure that she had any grossly bloody stools during that time.  No melena.  Denies melena hematochezia recently.  She was called back to come to the ED because her thiamine was low.   She has had several recent evaluations both inpatient and outpatient in the emergency department as well as by neurology with a sending numbness, lower extremity weakness, and ataxia.  Reportedly worked up for and treated for Guillan-Barr syndrome.  Chart review shows patient had IVIG started on 11/17/2018 and is status post 5 days of treatment.  Reported no improvement after this treatment.  Since she was discharged in mid January she has had no significant change in her symptoms.  They do not feel that they are progressing.  She has had some shortness of breath intermittently but only a small amount.  No chest pain.  Reports intermittent double vision and slight worsening of her baseline blurred vision despite wearing her corrective lenses.  No difficulty swallowing or eating or drinking.  No dysarthria, aphasia, or dysphagia.  Past Medical History:  Diagnosis Date  . Abnormal Pap smear   . ADD  (attention deficit disorder)   . Anxiety   . Family history of anesthesia complication    mother has difficult  time going to sleep  . Headache(784.0)   . HSV-1 infection   . IBS (irritable bowel syndrome)   . Tachycardia     Patient Active Problem List   Diagnosis Date Noted  . Dry beriberi 12/06/2018  . AIDP (acute inflammatory demyelinating polyneuropathy) ([place]) 11/19/2018  . Weakness of both lower extremities 11/16/2018  . Hypokalemia 11/16/2018  . Anxiety 11/16/2018  . Acute pharyngitis 10/01/2013  . Depo-Provera contraceptive status 05/17/2013  . Postpartum depression 10/09/2012  . Lactation suppression 10/09/2012  . Tachycardia 03/09/2012  . HSV-1 infection     Past Surgical History:  Procedure Laterality Date  . BREAST BIOPSY Right   . BREAST SURGERY     Biopsy-benign  . CESAREAN SECTION  09/13/2012   Procedure: CESAREAN SECTION;  Surgeon: Lavonia Drafts, MD;  Location: [facility];  Service: Obstetrics;  Laterality: N/A;  . KYPHOPLASTY N/A 08/27/2013   Procedure: Thoracic Twelve Kyphoplasty;  Surgeon: Erline Levine, MD;  Location: [facility];  Service: Neurosurgery;  Laterality: N/A;  T12 Kyphoplasty  . WISDOM TOOTH EXTRACTION     x4  . WRIST SURGERY Right 07-23-2013     OB History    Gravida  1   Para  1   Term  0   Preterm  1   AB  0   Living  2     SAB  0  TAB  0   Ectopic  0   Multiple  1   Live Births  2            Home Medications    Prior to Admission medications   Medication Sig Start Date End Date Taking? Authorizing Provider  calcium carbonate (TUMS EX) 750 MG chewable tablet Chew 1-2 tablets by mouth as needed (for heartburn or indigestion).   Yes [provider]  Eluxadoline (VIBERZI) 75 MG TABS Take 75 mg by mouth daily as needed (for symptoms- diarrhea).    Yes [provider]  gabapentin (NEURONTIN) 100 MG capsule Increase your total gabapentin dose to 500 mg three times a day. Patient taking  differently: Take 500 mg by mouth 3 (three) times daily.  12/04/18  Yes Upstill, Nehemiah Settle, PA-C  hydrocortisone (ANUSOL-HC) 2.5 % rectal cream Place 1 application rectally 2 (two) times daily. Use for 14 days Patient taking differently: Place 1 application rectally as needed for hemorrhoids or anal itching.  10/19/18  Yes Levin Erp, PA  ibuprofen (ADVIL,MOTRIN) 200 MG tablet Take 800 mg by mouth every 6 (six) hours as needed for mild pain or moderate pain.   Yes [provider]  levonorgestrel (MIRENA) 20 MCG/24HR IUD 1 each by Intrauterine route once.   Yes [provider]  loperamide (IMODIUM) 2 MG capsule Take 2 mg by mouth as needed for diarrhea or loose stools.    Yes [provider]  Multiple Vitamin (MULTIVITAMIN) tablet Take 1 tablet by mouth daily.   Yes [provider]  ondansetron (ZOFRAN) 4 MG tablet Take every 4-6 hours as needed. Patient taking differently: Take 4 mg by mouth See admin instructions. Take 4 mg by mouth every 4-6 hours as needed for nausea or vomiting 10/19/18  Yes Levin Erp, PA  oxyCODONE-acetaminophen (PERCOCET/ROXICET) 5-325 MG tablet Take 1 tablet by mouth every 6 (six) hours as needed for severe pain. 12/04/18  Yes Upstill, Shari, PA-C  pantoprazole (PROTONIX) 40 MG tablet TAKE 1 TABLET (40 MG TOTAL) BY MOUTH DAILY. TAKE 30-60 MINUTES BEFORE BREAKFAST. Patient taking differently: Take 40 mg by mouth daily before breakfast.  12/06/18  Yes Lemmon, Lavone Nian, PA  polyethylene glycol (MIRALAX / GLYCOLAX) packet Take 17 g by mouth daily as needed for mild constipation.   Yes [provider]  HYDROcodone-acetaminophen (NORCO/VICODIN) 5-325 MG tablet Take 1-2 tablets by mouth every 6 (six) hours as needed for moderate pain. 11/21/18   Shelly Coss, MD  ondansetron (ZOFRAN ODT) 4 MG disintegrating tablet Take 1 tablet (4 mg total) by mouth every 8 (eight) hours as needed for nausea or vomiting. Patient  not taking: Reported on 12/06/2018 11/11/18   Tegeler, Gwenyth Allegra, MD    Family History Family History  Problem Relation Age of Onset  . Depression Mother        chronic  . Hearing loss Mother   . Hyperlipidemia Mother   . Vision loss Mother        beachets disease  . COPD Father   . Diabetes Father   . Cancer Maternal Grandmother 22       breast cancer  . Parkinsonism Maternal Grandmother   . Heart disease Maternal Grandmother   . Breast cancer Maternal Grandmother   . Alzheimer's disease Paternal Grandmother   . Heart disease Paternal Grandfather        Died age 51  . Nephrolithiasis Paternal Grandfather   . Heart disease Maternal Grandfather   . Cancer  Other 60       breast  . Breast cancer Other     Social History Social History   Tobacco Use  . Smoking status: Current Every Day Smoker    Packs/day: 0.50    Years: 10.00    Pack years: 5.00    Types: Cigarettes    Last attempt to quit: 01/31/2012    Years since quitting: 6.8  . Smokeless tobacco: Never Used  Substance Use Topics  . Alcohol use: Yes    Comment: occasionally  . Drug use: No     Allergies   Tape   Review of Systems Review of Systems  Constitutional: Positive for activity change and fatigue. Negative for chills, diaphoresis and fever.  HENT: Negative for ear pain and sore throat.   Eyes: Positive for visual disturbance. Negative for photophobia, pain and redness.  Respiratory: Positive for shortness of breath (minimal). Negative for cough.   Cardiovascular: Negative for chest pain and palpitations.  Gastrointestinal: Positive for constipation. Negative for abdominal pain and blood in stool.  Genitourinary: Negative for difficulty urinating, dysuria and hematuria.  Musculoskeletal: Positive for arthralgias, back pain and myalgias.  Skin: Negative for color change and rash.  Neurological: Positive for weakness and numbness. Negative for seizures, syncope, facial asymmetry and speech  difficulty.  All other systems reviewed and are negative.    Physical Exam Updated Vital Signs BP (!) 154/112 (BP Location: Right Arm)   Pulse (!) 105   Temp 98.2 F (36.8 C) (Oral)   Resp 18   SpO2 100%   Physical Exam Vitals signs and nursing note reviewed.  Constitutional:      General: She is not in acute distress.    Appearance: Normal appearance. She is well-developed. She is not diaphoretic.  HENT:     Head: Normocephalic and atraumatic.  Eyes:     General: No visual field deficit.    Conjunctiva/sclera: Conjunctivae normal.  Neck:     Musculoskeletal: Neck supple.  Cardiovascular:     Rate and Rhythm: Normal rate and regular rhythm.     Heart sounds: No murmur.  Pulmonary:     Effort: Pulmonary effort is normal. No respiratory distress.     Breath sounds: Normal breath sounds.  Abdominal:     Palpations: Abdomen is soft.     Tenderness: There is no abdominal tenderness.  Skin:    General: Skin is warm and dry.  Neurological:     General: No focal deficit present.     Mental Status: She is alert and oriented to person, place, and time.     GCS: GCS eye subscore is 4. GCS verbal subscore is 5. GCS motor subscore is 6.     Cranial Nerves: Cranial nerves are intact. No cranial nerve deficit, dysarthria or facial asymmetry.     Sensory: Sensory deficit present.     Motor: Weakness present. No abnormal muscle tone or pronator drift.     Coordination: Finger-Nose-Finger Test abnormal. Impaired rapid alternating movements.     Deep Tendon Reflexes: Reflexes abnormal.     Reflex Scores:      Bicep reflexes are 0 on the right side and 0 on the left side.      Patellar reflexes are 0 on the right side and 0 on the left side.      Achilles reflexes are 0 on the right side and 0 on the left side.    Comments: Strength is 4+ out of 5 in the bilateral  upper extremities.  4+ out of 5 in the right lower extremity with exception of flexion of the right knee where it is about 4  out of 5.  Strength is 4 out of 5 throughout on the left lower extremity.    Decreased sensation to light touch in the lower extremities, abdomen and lower thorax circumferentially, and from around the elbow distally both upper extremities.  Temperature sensation intact.  Slight decrease sensation to pain in this distribution.  Psychiatric:        Behavior: Behavior is cooperative.      ED Treatments / Results  Labs (all labs ordered are listed, but only abnormal results are displayed) Labs Reviewed  CBC WITH DIFFERENTIAL/PLATELET - Abnormal; Notable for the following components:      Result Value   Hemoglobin 15.1 (*)    HCT 46.1 (*)    MCV 101.8 (*)    All other components within normal limits  COMPREHENSIVE METABOLIC PANEL - Abnormal; Notable for the following components:   CO2 17 (*)    Glucose, Bld 124 (*)    All other components within normal limits  PHOSPHORUS - Abnormal; Notable for the following components:   Phosphorus 4.9 (*)    All other components within normal limits  URINALYSIS, ROUTINE W REFLEX MICROSCOPIC - Abnormal; Notable for the following components:   APPearance HAZY (*)    Hgb urine dipstick SMALL (*)    Bacteria, UA RARE (*)    All other components within normal limits  CBG MONITORING, ED - Abnormal; Notable for the following components:   Glucose-Capillary 125 (*)    All other components within normal limits  MAGNESIUM  POC URINE PREG, ED    EKG None  Radiology Dg Chest Portable 1 View  Result Date: 12/06/2018 CLINICAL DATA:  Tachycardia.  Lower extremity edema. EXAM: PORTABLE CHEST 1 VIEW COMPARISON:  Radiographs of November 19, 2018. FINDINGS: The heart size and mediastinal contours are within normal limits. Both lungs are clear. The visualized skeletal structures are unremarkable. IMPRESSION: No active disease. Electronically Signed   By: Marijo Conception, M.D.   On: 12/06/2018 16:25    Procedures Procedures (including critical care  time)  Medications Ordered in ED Medications  gabapentin (NEURONTIN) capsule 500 mg (has no administration in time range)  morphine (MSIR) tablet 15 mg (has no administration in time range)  thiamine (B-1) injection 500 mg (has no administration in time range)  0.9 %  sodium chloride infusion (has no administration in time range)     Initial Impression / Assessment and Plan / ED Course  I have reviewed the triage vital signs and the nursing notes.  Pertinent labs & imaging results that were available during my care of the patient were reviewed by me and considered in my medical decision making (see chart for details).     MDM:  Imaging: None indicated at this time  ED Provider Interpretation of EKG: None indicated at this time.    Labs: Chemistry panel with CO2 of 17 and anion gap 11.  Otherwise unremarkable.  Phosphorus slightly elevated beyond baseline.  Pregnancy negative.  CBC unremarkable.  On initial evaluation, patient appears stable. Afebrile and hemodynamically stable. Alert and oriented x4, pleasant, and cooperative.  Presents with weakness/numbness and B1 deficiency as above.  Chart review shows MRI C/T/L-spine on 11/16/2018 showed minimal degenerative disc disease at C4-C5 and C5-C6, chronic T1/T3/T12 compression fractures and degenerative changes in the lower thoracic spine.  Mild enhancement of ventral  right S1 nerve root consistent with radiculitis.  Discharge summary from 11/21/2018 showed neurology has been planning for plasma exchange if no significant improvement around 2 weeks from the time of discharge.  Follow-up scheduled for 12/14/2017 with outpatient planning for EMG/nerve conduction test.  Neurology note from Dr. Cheral Marker on 11/21/2018 showed 4+ out of 5 strength bilateral upper extremities and 5 out of 5 strength in the lower extremities with exception of 4 out of 5 with knee flexion bilaterally.  Decrease in station to light touch up to level of T4 with cold touch  intact.  2+ upper extremity reflexes with no knee or ankle reflexes.  Mild dysmetria noted on there exam.  On exam, patient has what appears to be stable findings compared to prior documentation.  See above.  NIF and FVC -40 x 2 and 2.5 L x 2, respectively.  For appropriate.  Thiamine level from 11/16/2018 resulted at 47.2.  Concern for component of beriberi.  Mostly exhibits symptoms of dry beriberi without frank evidence for heart failure or lead.  Although she does have tachycardia at 106 on triage.  Chest x-ray shows no acute pathology.  No significant evidence for CHF otherwise at this time.   Neurology consulted and after discussion I ordered 500 IV thiamine per their recs.  No nystagmus, ophthalmoplegia, or confusion.  Ataxia documented previously.  Low suspicion overall for Warnicke's encephalopathy.  No evidence for Korsakoff syndrome at this time.  Labs otherwise unremarkable as above.  Non-gap mild metabolic acidosis likely secondary to GI losses given her chronic diarrhea.  Maintenance fluids ordered by inpatient team.  Admitted to medicine team per neurology recommendation in stable condition.  The plan for this patient was discussed with Dr. Kathrynn Humble who voiced agreement and who oversaw evaluation and treatment of this patient.   The patient was fully informed and involved with the history taking, evaluation, workup including labs/images, and plan. The patient's concerns and questions were addressed to the patient's satisfaction and she expressed agreement with the plan to admit.    Final Clinical Impressions(s) / ED Diagnoses   Final diagnoses:  Dry beri-beri  Vitamin B1 deficiency    ED Discharge Orders    None       Justice Aguirre, Rodena Goldmann, MD 12/06/18 [place], MD 12/07/18 [phone]

## 2018-12-07 ENCOUNTER — Encounter (HOSPITAL_COMMUNITY): Payer: Self-pay | Admitting: *Deleted

## 2018-12-07 ENCOUNTER — Other Ambulatory Visit: Payer: Self-pay

## 2018-12-07 LAB — CBC
HCT: 41.4 % (ref 36.0–46.0)
Hemoglobin: 13.8 g/dL (ref 12.0–15.0)
MCH: 33.7 pg (ref 26.0–34.0)
MCHC: 33.3 g/dL (ref 30.0–36.0)
MCV: 101 fL — ABNORMAL HIGH (ref 80.0–100.0)
Platelets: 303 10*3/uL (ref 150–400)
RBC: 4.1 MIL/uL (ref 3.87–5.11)
RDW: 13.7 % (ref 11.5–15.5)
WBC: 6.2 10*3/uL (ref 4.0–10.5)
nRBC: 0 % (ref 0.0–0.2)

## 2018-12-07 LAB — COMPREHENSIVE METABOLIC PANEL
ALT: 29 U/L (ref 0–44)
AST: 33 U/L (ref 15–41)
Albumin: 3.1 g/dL — ABNORMAL LOW (ref 3.5–5.0)
Alkaline Phosphatase: 40 U/L (ref 38–126)
Anion gap: 7 (ref 5–15)
BUN: 6 mg/dL (ref 6–20)
CO2: 21 mmol/L — ABNORMAL LOW (ref 22–32)
Calcium: 8.9 mg/dL (ref 8.9–10.3)
Chloride: 113 mmol/L — ABNORMAL HIGH (ref 98–111)
Creatinine, Ser: 0.58 mg/dL (ref 0.44–1.00)
GFR calc Af Amer: >60 mL/min (ref >60)
GFR calc non Af Amer: >60 mL/min (ref >60)
Glucose, Bld: 92 mg/dL (ref 70–99)
Potassium: 3.9 mmol/L (ref 3.5–5.1)
Sodium: 141 mmol/L (ref 135–145)
Total Bilirubin: 0.7 mg/dL (ref 0.3–1.2)
Total Protein: 6.6 g/dL (ref 6.5–8.1)

## 2018-12-07 LAB — MAGNESIUM: Magnesium: 1.7 mg/dL (ref 1.7–2.4)

## 2018-12-07 LAB — PHOSPHORUS: Phosphorus: 4.5 mg/dL (ref 2.5–4.6)

## 2018-12-07 MED ORDER — THIAMINE HCL 100 MG/ML IJ SOLN
500.0000 mg | Freq: Three times a day (TID) | INTRAVENOUS | Status: DC
  Administered 2018-12-07 – 2018-12-09 (×6): 500 mg via INTRAVENOUS
  Filled 2018-12-07 (×7): qty 5

## 2018-12-07 MED ORDER — LABETALOL HCL 5 MG/ML IV SOLN: 5.0000 mg | INTRAVENOUS | Status: DC | PRN

## 2018-12-07 NOTE — Progress Notes (Signed)
VC 4.3L and NIF -60 with great effort

## 2018-12-07 NOTE — Progress Notes (Signed)
PROGRESS NOTE    Kimberly [name]  NFA:[id number] DOB: September 04, 1986 DOA: 12/06/2018 PCP: [name], MD     Brief Narrative:  Kimberly [name] is a 33 y.o. female with medical history significant of ADD, Anxiety, IBS, GERD, Tachycardia, history of hypokalemia, and other comorbidities presents with similar symptoms of when she was discharged last with acsending numbness, lower extremity weakness and ataxia as well as frequent falls.  She was called by the neurologist and told her to go to her PCP for B1 replacement as her B1 level was severely low.  Recently she was hospitalized for concern for Guillain Barr versus nutritional deficiency and she was given 5 days of IVIG.  Symptoms had not improved significantly and she reports numbness of her body below her chest.  PT evaluated and recommended home health PT and neurology had cleared her for discharge with outpatient follow-up will go for neurology at that time.  Patient states that the numbness started January 2 and progressively worsened but is not worse than when she received IVIG.  Since her hospitalization she is fallen at least 5-6 times and ambulates with a walker and a wheelchair sometimes.  Her main complaint has now been lower extremity pain that has been shooting and burning.  She states that she takes gabapentin for this.  Recently talked with neurology who recommended her to go to the her PCP for thiamine replacement given her B1 levels but since PCP is unable to do this she was referred to the ED for admission.   New events last 24 hours / Subjective: No acute events overnight.  Continues to have lower extremity weakness as well as neuropathy  Assessment & Plan:   Active Problems:   Tachycardia   Weakness of both lower extremities   Anxiety   Dry beriberi   Tobacco abuse   Hyperglycemia   Metabolic acidosis   IBS (irritable bowel syndrome)   Constipation   GERD (gastroesophageal reflux disease)   Dry Beriberi  -Recently  admitted for weakness and numbness or concern for Guillian-Barr and was started on IVIG for 5 days of therapy. Further work-up reveals that she has severe thiamine deficiency -Patient has a follow-up appointment with Dr. Lavell Anchors of neurology and plan is for outpatient EMG nerve conduction study -Neurology recommended 3 days of 500 mg of IV thiamine 3 times daily  -PT OT  Tobacco abuse -Smoking cessation counseling  Sinus tachycardia -States ranges from 100-125 normally -Had an event monitor in December, revealed no significant arrhythmia  GERD -PPI   DVT prophylaxis: Lovenox Code Status: Full code Family Communication: No family at bedside Disposition Plan: Continue IV thiamine   Consultants:   None  Procedures:   None  Antimicrobials:  Anti-infectives (From admission, onward)   None        Objective: Vitals:   12/07/18 0026 12/07/18 0522 12/07/18 0730 12/07/18 1127  BP: (!) 153/118 (!) 132/94 (!) 145/117 (!) 157/101  Pulse: (!) 104 (!) 108 (!) 103 97  Resp: 17  20 18   Temp: (!) 97.5 F (36.4 C) 98.9 F (37.2 C) 98 F (36.7 C) 98.9 F (37.2 C)  TempSrc: Oral Oral Oral Oral  SpO2: 99% 97% 100% 100%    Intake/Output Summary (Last 24 hours) at 12/07/2018 1327 Last data filed at 12/07/2018 0600 Gross per 24 hour  Intake 862.25 ml  Output -  Net 862.25 ml   There were no vitals filed for this visit.  Examination:  General exam: Appears calm and  comfortable  Respiratory system: Clear to auscultation. Respiratory effort normal. Cardiovascular system: S1 & S2 heard, RRR. No JVD, murmurs, rubs, gallops or clicks. No pedal edema. Gastrointestinal system: Abdomen is nondistended, soft and nontender. No organomegaly or masses felt. Normal bowel sounds heard. Central nervous system: Alert and oriented Extremities: Symmetric, LLE weaker compared to RLE  Skin: No rashes, lesions or ulcers Psychiatry: Judgement and insight appear normal. Mood & affect  appropriate.   Data Reviewed: I have personally reviewed following labs and imaging studies  CBC: Recent Labs  Lab 12/04/18 0130 12/06/18 1601 12/07/18 0524  WBC 7.8 7.8 6.2  NEUTROABS 4.4 4.7  --   HGB 13.8 15.1* 13.8  HCT 43.6 46.1* 41.4  MCV 103.3* 101.8* 101.0*  PLT 297 313 829   Basic Metabolic Panel: Recent Labs  Lab 12/04/18 0130 12/06/18 1601 12/07/18 0524  NA 134* 136 141  K 3.5 3.9 3.9  CL 103 108 113*  CO2 21* 17* 21*  GLUCOSE 108* 124* 92  BUN 12 7 6   CREATININE 0.56 0.59 0.58  CALCIUM 9.2 9.7 8.9  MG  --  1.8 1.7  PHOS  --  4.9* 4.5   GFR: CrCl cannot be calculated (Unknown ideal weight.). Liver Function Tests: Recent Labs  Lab 12/04/18 0130 12/06/18 1601 12/07/18 0524  AST 34 37 33  ALT 32 35 29  ALKPHOS 43 45 40  BILITOT 0.7 0.5 0.7  PROT 7.6 8.1 6.6  ALBUMIN 3.5 3.9 3.1*   No results for input(s): LIPASE, AMYLASE in the last 168 hours. No results for input(s): AMMONIA in the last 168 hours. Coagulation Profile: No results for input(s): INR, PROTIME in the last 168 hours. Cardiac Enzymes: No results for input(s): CKTOTAL, CKMB, CKMBINDEX, TROPONINI in the last 168 hours. BNP (last 3 results) No results for input(s): PROBNP in the last 8760 hours. HbA1C: No results for input(s): HGBA1C in the last 72 hours. CBG: Recent Labs  Lab 12/06/18 1625  GLUCAP 125*   Lipid Profile: No results for input(s): CHOL, HDL, LDLCALC, TRIG, CHOLHDL, LDLDIRECT in the last 72 hours. Thyroid Function Tests: Recent Labs    12/06/18 1820  TSH 1.618   Anemia Panel: No results for input(s): VITAMINB12, FOLATE, FERRITIN, TIBC, IRON, RETICCTPCT in the last 72 hours. Sepsis Labs: No results for input(s): PROCALCITON, LATICACIDVEN in the last 168 hours.  No results found for this or any previous visit (from the past 240 hour(s)).     Radiology Studies: Dg Chest Portable 1 View  Result Date: 12/06/2018 CLINICAL DATA:  Tachycardia.  Lower extremity  edema. EXAM: PORTABLE CHEST 1 VIEW COMPARISON:  Radiographs of November 19, 2018. FINDINGS: The heart size and mediastinal contours are within normal limits. Both lungs are clear. The visualized skeletal structures are unremarkable. IMPRESSION: No active disease. Electronically Signed   By: Marijo Conception, M.D.   On: 12/06/2018 16:25      Scheduled Meds: . enoxaparin (LOVENOX) injection  40 mg Subcutaneous Q24H  . gabapentin  500 mg Oral TID  . multivitamin with minerals  1 tablet Oral Daily  . pantoprazole  40 mg Oral QAC breakfast   Continuous Infusions: . sodium chloride 75 mL/hr at 12/07/18 0803  . thiamine (VITAMIN B1) IVPB 500 mg (12/07/18 1133)     LOS: 1 day    Time spent: 35 minutes   Dessa Phi, DO [facility] [email or web address] 12/07/2018, 1:27 PM

## 2018-12-07 NOTE — Progress Notes (Signed)
Rt note: VC 4.8L and NIF -60 great effort

## 2018-12-07 NOTE — Progress Notes (Signed)
Paged no-call provider for the elevated BP, waiting for a response

## 2018-12-08 NOTE — Progress Notes (Signed)
PROGRESS NOTE    Kimberly [name]  YTW:[id number] DOB: 1986-05-25 DOA: 12/06/2018 PCP: [name], MD     Brief Narrative:  Kimberly [name] is a 33 y.o. female with medical history significant of ADD, Anxiety, IBS, GERD, Tachycardia, history of hypokalemia, and other comorbidities presents with similar symptoms of when she was discharged last with acsending numbness, lower extremity weakness and ataxia as well as frequent falls.  She was called by the neurologist and told her to go to her PCP for B1 replacement as her B1 level was severely low.  Recently she was hospitalized for concern for Guillain Barr versus nutritional deficiency and she was given 5 days of IVIG.  Symptoms had not improved significantly and she reports numbness of her body below her chest.  PT evaluated and recommended home health PT and neurology had cleared her for discharge with outpatient follow-up will go for neurology at that time.  Patient states that the numbness started January 2 and progressively worsened but is not worse than when she received IVIG.  Since her hospitalization she is fallen at least 5-6 times and ambulates with a walker and a wheelchair sometimes.  Her main complaint has now been lower extremity pain that has been shooting and burning.  She states that she takes gabapentin for this.  Recently talked with neurology who recommended her to go to the her PCP for thiamine replacement given her B1 levels but since PCP is unable to do this she was referred to the ED for admission.   New events last 24 hours / Subjective: Did not sleep well overnight, no other acute events  Assessment & Plan:   Active Problems:   Tachycardia   Weakness of both lower extremities   Anxiety   Dry beriberi   Tobacco abuse   Hyperglycemia   Metabolic acidosis   IBS (irritable bowel syndrome)   Constipation   GERD (gastroesophageal reflux disease)   Dry Beriberi  -Recently admitted for weakness and numbness or  concern for Guillian-Barr and was started on IVIG for 5 days of therapy. Further work-up reveals that she has severe thiamine deficiency -Patient has a follow-up appointment with Dr. Lavell Anchors of neurology and plan is for outpatient EMG nerve conduction study -Neurology recommended 3 days of 500 mg of IV thiamine 3 times daily  -PT OT  Tobacco abuse -Smoking cessation counseling  Sinus tachycardia -States ranges from 100-125 normally -Had an event monitor in December, revealed no significant arrhythmia -Telemetry reviewed independently, ranges from normal sinus rhythm to sinus tachycardia  GERD -PPI   DVT prophylaxis: Lovenox Code Status: Full code Family Communication: No family at bedside Disposition Plan: Continue IV thiamine   Consultants:   None  Procedures:   None  Antimicrobials:  Anti-infectives (From admission, onward)   None       Objective: Vitals:   12/07/18 2336 12/08/18 0439 12/08/18 0600 12/08/18 0824  BP: (!) 134/97 118/81  (!) 156/100  Pulse: 91 93  (!) 105  Resp:  18  17  Temp: 98.3 F (36.8 C) 98.6 F (37 C)  98.3 F (36.8 C)  TempSrc: Oral Oral  Oral  SpO2: 98% 100%  100%  Weight:   68.1 kg     Intake/Output Summary (Last 24 hours) at 12/08/2018 1012 Last data filed at 12/08/2018 0300 Gross per 24 hour  Intake 726.87 ml  Output -  Net 726.87 ml   Filed Weights   12/08/18 0600  Weight: 68.1 kg  Examination: General exam: Appears calm and comfortable  Respiratory system: Clear to auscultation. Respiratory effort normal. Cardiovascular system: S1 & S2 heard, tachycardic, regular rhythm. No JVD, murmurs, rubs, gallops or clicks. No pedal edema. Gastrointestinal system: Abdomen is nondistended, soft and nontender. No organomegaly or masses felt. Normal bowel sounds heard. Central nervous system: Alert and oriented. No focal neurological deficits. Extremities: Symmetric  Skin: No rashes, lesions or ulcers Psychiatry: Judgement and  insight appear normal. Mood & affect appropriate.    Data Reviewed: I have personally reviewed following labs and imaging studies  CBC: Recent Labs  Lab 12/04/18 0130 12/06/18 1601 12/07/18 0524  WBC 7.8 7.8 6.2  NEUTROABS 4.4 4.7  --   HGB 13.8 15.1* 13.8  HCT 43.6 46.1* 41.4  MCV 103.3* 101.8* 101.0*  PLT 297 313 161   Basic Metabolic Panel: Recent Labs  Lab 12/04/18 0130 12/06/18 1601 12/07/18 0524  NA 134* 136 141  K 3.5 3.9 3.9  CL 103 108 113*  CO2 21* 17* 21*  GLUCOSE 108* 124* 92  BUN 12 7 6   CREATININE 0.56 0.59 0.58  CALCIUM 9.2 9.7 8.9  MG  --  1.8 1.7  PHOS  --  4.9* 4.5   GFR: Estimated Creatinine Clearance: 96.4 mL/min (by C-G formula based on SCr of 0.58 mg/dL). Liver Function Tests: Recent Labs  Lab 12/04/18 0130 12/06/18 1601 12/07/18 0524  AST 34 37 33  ALT 32 35 29  ALKPHOS 43 45 40  BILITOT 0.7 0.5 0.7  PROT 7.6 8.1 6.6  ALBUMIN 3.5 3.9 3.1*   No results for input(s): LIPASE, AMYLASE in the last 168 hours. No results for input(s): AMMONIA in the last 168 hours. Coagulation Profile: No results for input(s): INR, PROTIME in the last 168 hours. Cardiac Enzymes: No results for input(s): CKTOTAL, CKMB, CKMBINDEX, TROPONINI in the last 168 hours. BNP (last 3 results) No results for input(s): PROBNP in the last 8760 hours. HbA1C: No results for input(s): HGBA1C in the last 72 hours. CBG: Recent Labs  Lab 12/06/18 1625  GLUCAP 125*   Lipid Profile: No results for input(s): CHOL, HDL, LDLCALC, TRIG, CHOLHDL, LDLDIRECT in the last 72 hours. Thyroid Function Tests: Recent Labs    12/06/18 1820  TSH 1.618   Anemia Panel: No results for input(s): VITAMINB12, FOLATE, FERRITIN, TIBC, IRON, RETICCTPCT in the last 72 hours. Sepsis Labs: No results for input(s): PROCALCITON, LATICACIDVEN in the last 168 hours.  No results found for this or any previous visit (from the past 240 hour(s)).     Radiology Studies: Dg Chest Portable 1  View  Result Date: 12/06/2018 CLINICAL DATA:  Tachycardia.  Lower extremity edema. EXAM: PORTABLE CHEST 1 VIEW COMPARISON:  Radiographs of November 19, 2018. FINDINGS: The heart size and mediastinal contours are within normal limits. Both lungs are clear. The visualized skeletal structures are unremarkable. IMPRESSION: No active disease. Electronically Signed   By: Marijo Conception, M.D.   On: 12/06/2018 16:25      Scheduled Meds: . enoxaparin (LOVENOX) injection  40 mg Subcutaneous Q24H  . gabapentin  500 mg Oral TID  . multivitamin with minerals  1 tablet Oral Daily  . pantoprazole  40 mg Oral QAC breakfast   Continuous Infusions: . thiamine (VITAMIN B1) IVPB 500 mg (12/08/18 0216)     LOS: 2 days    Time spent: 43minutes   Dessa Phi, DO [facility] [email or web address] 12/08/2018, 10:12 AM

## 2018-12-08 NOTE — Progress Notes (Signed)
RT Note: NIF and VItal Capacity was done with patient and the results are: Vital Capacity 3.1 L with a good effort and NIF- -60 with a good effort. Rt will continue to monitor and assess parameters.

## 2018-12-08 NOTE — Evaluation (Signed)
Occupational Therapy Evaluation Patient Details Name: Kimberly [name] MRN: [id number] DOB: [birth year] Today's Date: 12/08/2018    History of Present Illness Pt is a 33 y.o. female with PMH including but not limited to anxiety and ADD, anxiety and T12 kyphoplasty in 2014 presenting with similar symptoms of when she was recently admitted and discharged (1/16-1/20) with ascending numbness, lower extremity weakness and ataxia as well as frequent falls.  She was called by the neurologist and told her to go to her PCP for B1 replacement as her B1 level was severely low.  Recently she was hospitalized for concern for Guyon Barr versus nutritional deficiency and she was given 5 days of IVIG.  Symptoms had not improved significantly and she reports numbness of her body below her chest.    Clinical Impression   PTA, pt was living with her husband and two 15-yo sons. Pt was performing BADLs and using RW and w/c for functional mobility. Pt currently requiring Min Guard A for LB ADLs and functional mobility due to decreased balance and strength at BLEs. Discussed with patient she goals to return to cooking for her family; facilitated problem solving of different ways to approach cooking. Provided pt with theraband to continue to exercises provided by [facility] as she is admitted in hospital. Pt would benefit from further acute OT to facilitate safe dc. Recommend dc to home with HHOT for further OT to optimize safety, independence with ADLs, and return to PLOF.      Follow Up Recommendations  Home health OT    Equipment Recommendations  None recommended by OT    Recommendations for Other Services PT consult     Precautions / Restrictions Precautions Precautions: Fall Precaution Comments: multiple falls at home since previous admission Restrictions Weight Bearing Restrictions: No      Mobility Bed Mobility Overal bed mobility: Modified Independent             General bed mobility comments:  Increased time as needed  Transfers Overall transfer level: Needs assistance Equipment used: Rolling walker (2 wheeled) Transfers: Sit to/from Stand Sit to Stand: Min guard         General transfer comment: min guard for safety    Balance Overall balance assessment: Needs assistance;History of Falls Sitting-balance support: Feet supported Sitting balance-Leahy Scale: Good Sitting balance - Comments: pt able to sit and bathe her lower body, LEs and feet with supervision for safety   Standing balance support: Single extremity supported;Bilateral upper extremity supported Standing balance-Leahy Scale: Poor                             ADL either performed or assessed with clinical judgement   ADL Overall ADL's : Needs assistance/impaired Eating/Feeding: Supervision/ safety;Set up;Sitting   Grooming: Supervision/safety;Set up;Sitting   Upper Body Bathing: Supervision/ safety;Set up;Sitting   Lower Body Bathing: Min guard;Sit to/from stand   Upper Body Dressing : Set up;Sitting   Lower Body Dressing: Min guard;Sit to/from stand Lower Body Dressing Details (indicate cue type and reason): Pt donning shoes while sitting in bed. Min Guard A for safety in standing Toilet Transfer: Min guard;RW;Ambulation(simulated in room)         Tub/Shower Transfer Details (indicate cue type and reason): Discussed how pt has been performing shower transfer since prior admission. Pt discussed how she side steps with RW.  Functional mobility during ADLs: Min guard;Rolling walker General ADL Comments: Pt motivated to participate in therapy. Requiring  Min guard A for OOB activity      Vision Baseline Vision/History: Wears glasses Wears Glasses: At all times Patient Visual Report: No change from baseline       Perception     Praxis      Pertinent Vitals/Pain Pain Assessment: Faces Pain Score: 9  Faces Pain Scale: Hurts little more Pain Location: bilateral LEs, bilateral  UEs Pain Descriptors / Indicators: Burning;Sharp;Shooting;Sore Pain Intervention(s): Monitored during session;Limited activity within patient's tolerance;Repositioned     Hand Dominance Right   Extremity/Trunk Assessment Upper Extremity Assessment Upper Extremity Assessment: RUE deficits/detail;LUE deficits/detail RUE Deficits / Details: 4/5 throughout. Decreased FM skills due to decreased strength RUE Coordination: decreased fine motor LUE Deficits / Details: 3/5 throughout. Decreased FM skills due to decreased strength and sensation. Pt with poor light touch sensation on ventral side of arm.  LUE Sensation: decreased light touch LUE Coordination: decreased fine motor   Lower Extremity Assessment Lower Extremity Assessment: Defer to PT evaluation   Cervical / Trunk Assessment Cervical / Trunk Assessment: Normal   Communication Communication Communication: No difficulties   Cognition Arousal/Alertness: Awake/alert Behavior During Therapy: WFL for tasks assessed/performed Overall Cognitive Status: Within Functional Limits for tasks assessed                                 General Comments: Highly motivated to participate in therapy   General Comments       Exercises Exercises: Other exercises Other Exercises Other Exercises: Pt discribing exericses she learned from her [place]. Provided green and red theraband so she can continue exercises. Will follow up   Shoulder Instructions      Home Living Family/patient expects to be discharged to:: Private residence Living Arrangements: Spouse/significant other Available Help at Discharge: Family;Available PRN/intermittently Type of Home: House Home Access: Ramped entrance     Home Layout: One level     Bathroom Shower/Tub: Tub/shower unit;Walk-in shower   Bathroom Toilet: Standard     Home Equipment: Environmental consultant - 2 wheels;Bedside commode;Wheelchair - manual          Prior Functioning/Environment Level of  Independence: Independent with assistive device(s)        Comments: since previous admission, pt has been ambulating with RW and performing BADLs. Requiring assistance for IADLs        OT Problem List: Decreased strength;Decreased activity tolerance;Impaired balance (sitting and/or standing);Decreased coordination;Impaired sensation;Pain;Decreased knowledge of use of DME or AE      OT Treatment/Interventions: Self-care/ADL training;Neuromuscular education;DME and/or AE instruction;Therapeutic exercise;Energy conservation;Therapeutic activities;Patient/family education;Balance training    OT Goals(Current goals can be found in the care plan section) Acute Rehab OT Goals Patient Stated Goal: to get stronger OT Goal Formulation: With patient Time For Goal Achievement: 12/22/18 Potential to Achieve Goals: Good  OT Frequency: Min 3X/week   Barriers to D/C:            Co-evaluation              AM-PAC OT "6 Clicks" Daily Activity     Outcome Measure Help from another person eating meals?: None Help from another person taking care of personal grooming?: A Little Help from another person toileting, which includes using toliet, bedpan, or urinal?: A Little Help from another person bathing (including washing, rinsing, drying)?: A Little Help from another person to put on and taking off regular upper body clothing?: None Help from another person to put on and taking off  regular lower body clothing?: A Little 6 Click Score: 20   End of Session Equipment Utilized During Treatment: Gait belt;Rolling walker Nurse Communication: Mobility status  Activity Tolerance: Patient tolerated treatment well Patient left: in bed;with call bell/phone within reach  OT Visit Diagnosis: Other abnormalities of gait and mobility (R26.89);Muscle weakness (generalized) (M62.81)                Time: 1410-3013 OT Time Calculation (min): 19 min Charges:  OT General Charges $OT Visit: 1 Visit OT  Evaluation $OT Eval Moderate Complexity: [place], OTR/L Acute Rehab Pager: [phone] Office: [place] 12/08/2018, 12:53 PM

## 2018-12-08 NOTE — Progress Notes (Signed)
VC and NIF completed per phy orders.  Good effort noted for both maneuvers.  NIF -40 VC 4.0L

## 2018-12-08 NOTE — Evaluation (Signed)
Physical Therapy Evaluation Patient Details Name: Kimberly [name] MRN: [id number] DOB: [birth year] Today's Date: 12/08/2018   History of Present Illness  Pt is a 33 y.o. female with PMH including but not limited to anxiety and ADD, anxiety and T12 kyphoplasty in 2014 presenting with similar symptoms of when she was recently admitted and discharged (1/16-1/20) with ascending numbness, lower extremity weakness and ataxia as well as frequent falls.  She was called by the neurologist and told her to go to her PCP for B1 replacement as her B1 level was severely low.  Recently she was hospitalized for concern for Guyon Barr versus nutritional deficiency and she was given 5 days of IVIG.  Symptoms had not improved significantly and she reports numbness of her body below her chest.     Clinical Impression  Pt presented in bathroom with nurse tech present attempting to get into the shower. Pt with posterior LOB requiring use of UEs on walls/doorframe to maintain upright standing position. Prior to admission, pt reported that she has been ambulating with use of RW and performing ADLs independently. She also stated that she has been receiving HHPT and HHOT 2x/week each. She continues to be limited secondary to bilateral LE weakness and poor motor control. Pt would continue to benefit from skilled physical therapy services at this time while admitted and after d/c to address the below listed limitations in order to improve overall safety and independence with functional mobility.    Follow Up Recommendations Home health PT    Equipment Recommendations  None recommended by PT    Recommendations for Other Services       Precautions / Restrictions Precautions Precautions: Fall Precaution Comments: multiple falls at home since previous admission Restrictions Weight Bearing Restrictions: No      Mobility  Bed Mobility               General bed mobility comments: pt OOB in bathroom with nurse  tech upon arrival  Transfers Overall transfer level: Needs assistance Equipment used: Rolling walker (2 wheeled) Transfers: Sit to/from Stand Sit to Stand: Min guard         General transfer comment: min guard for safety, heavy use of UEs on arm rests of shower seat  Ambulation/Gait             General Gait Details: pt standing in bathroom upon arrival and attempting to transfer into shower. Pt with posterior LOB in which she had to grab onto the walls and the door frame to recover  Stairs            Wheelchair Mobility    Modified Rankin (Stroke Patients Only)       Balance Overall balance assessment: Needs assistance;History of Falls Sitting-balance support: Feet supported Sitting balance-Leahy Scale: Good Sitting balance - Comments: pt able to sit and bathe her lower body, LEs and feet with supervision for safety   Standing balance support: Single extremity supported;Bilateral upper extremity supported Standing balance-Leahy Scale: Poor                               Pertinent Vitals/Pain Pain Assessment: 0-10 Pain Score: 9  Pain Location: bilateral LEs, bilateral UEs Pain Descriptors / Indicators: Burning;Sharp;Shooting;Sore Pain Intervention(s): Monitored during session;Repositioned    Home Living Family/patient expects to be discharged to:: Private residence Living Arrangements: Spouse/significant other Available Help at Discharge: Family;Available PRN/intermittently Type of Home: House Home Access: Ramped entrance  Home Layout: One level Home Equipment: Walker - 2 wheels;Bedside commode;Wheelchair - manual      Prior Function Level of Independence: Independent with assistive device(s)         Comments: since previous admission, pt has been ambulating with RW     Hand Dominance        Extremity/Trunk Assessment   Upper Extremity Assessment Upper Extremity Assessment: Defer to OT evaluation    Lower Extremity  Assessment Lower Extremity Assessment: Generalized weakness(MMT not performed as pt was in bathroom/shower upon arrival)       Communication   Communication: No difficulties  Cognition Arousal/Alertness: Awake/alert Behavior During Therapy: WFL for tasks assessed/performed Overall Cognitive Status: Within Functional Limits for tasks assessed                                        General Comments      Exercises     Assessment/Plan    PT Assessment Patient needs continued PT services  PT Problem List Decreased strength;Decreased balance;Decreased mobility;Decreased coordination       PT Treatment Interventions DME instruction;Gait training;Stair training;Functional mobility training;Therapeutic activities;Therapeutic exercise;Balance training;Neuromuscular re-education;Patient/family education    PT Goals (Current goals can be found in the Care Plan section)  Acute Rehab PT Goals Patient Stated Goal: to get stronger PT Goal Formulation: With patient Time For Goal Achievement: 12/22/18 Potential to Achieve Goals: Good    Frequency Min 3X/week   Barriers to discharge        Co-evaluation               AM-PAC PT "6 Clicks" Mobility  Outcome Measure Help needed turning from your back to your side while in a flat bed without using bedrails?: None Help needed moving from lying on your back to sitting on the side of a flat bed without using bedrails?: None Help needed moving to and from a bed to a chair (including a wheelchair)?: A Little Help needed standing up from a chair using your arms (e.g., wheelchair or bedside chair)?: A Little Help needed to walk in hospital room?: A Little Help needed climbing 3-5 steps with a railing? : A Little 6 Click Score: 20    End of Session   Activity Tolerance: Patient tolerated treatment well Patient left: with call bell/phone within reach;with nursing/sitter in room;Other (comment)(nurse tech present; pt  seated on shower seat bathing) Nurse Communication: Mobility status PT Visit Diagnosis: Other abnormalities of gait and mobility (R26.89);Repeated falls (R29.6)    Time: 0950-1002 PT Time Calculation (min) (ACUTE ONLY): 12 min   Charges:   PT Evaluation $PT Eval Moderate Complexity: 1 Mod          Sherie Don, PT, DPT  Acute Rehabilitation Services Pager [phone] Office [place] 12/08/2018, 11:55 AM

## 2018-12-09 MED ORDER — THIAMINE HCL 100 MG/ML IJ SOLN
500.0000 mg | Freq: Three times a day (TID) | INTRAVENOUS | Status: AC
  Administered 2018-12-09: 500 mg via INTRAVENOUS
  Filled 2018-12-09: qty 5

## 2018-12-09 MED ORDER — GABAPENTIN 300 MG PO CAPS: 600.0000 mg | ORAL_CAPSULE | Freq: Three times a day (TID) | ORAL | 0 refills | Status: DC

## 2018-12-09 MED ORDER — PREGABALIN 50 MG PO CAPS: 50.0000 mg | ORAL_CAPSULE | Freq: Three times a day (TID) | ORAL | 0 refills | Status: DC

## 2018-12-09 MED ORDER — OXYCODONE-ACETAMINOPHEN 5-325 MG PO TABS: 1.0000 | ORAL_TABLET | Freq: Four times a day (QID) | ORAL | 0 refills | Status: DC | PRN

## 2018-12-09 MED ORDER — VITAMIN B-1 100 MG PO TABS: 100.0000 mg | ORAL_TABLET | Freq: Every day | ORAL | 0 refills | Status: DC

## 2018-12-09 NOTE — Progress Notes (Signed)
Patient ready for discharge to home; discharge instructions given and reviewed; Rx's sent electronically; patient assisted with belongings and discharged out via wheelchair; patient's father accompanied her home.

## 2018-12-09 NOTE — Progress Notes (Signed)
Occupational Therapy Treatment Patient Details Name: Kimberly [name] MRN: [id number] DOB: July 01, 1986 Today's Date: 12/09/2018    History of present illness Pt is a 33 y.o. female with PMH including but not limited to anxiety and ADD, anxiety and T12 kyphoplasty in 2014 presenting with similar symptoms of when she was recently admitted and discharged (1/16-1/20) with ascending numbness, lower extremity weakness and ataxia as well as frequent falls.  She was called by the neurologist and told her to go to her PCP for B1 replacement as her B1 level was severely low.  Recently she was hospitalized for concern for Guyon Barr versus nutritional deficiency and she was given 5 days of IVIG.  Symptoms had not improved significantly and she reports numbness of her body below her chest.    OT comments  Pt. Agreeable to participation in skilled OT with focus on B UE exercises.   Able to return demo and complete B UE exercises with use of theraband. Reports unable to do as many reps or with as much resistance prior to admit but very motivated to get her strength back.     Follow Up Recommendations       Equipment Recommendations    pt. States her primary MD and [facility] therapists are coordinating on getting her B knee braces from [employer]   Recommendations for Other Services      Precautions / Restrictions Precautions Precautions: Fall Precaution Comments: multiple falls at home since previous admission Restrictions Other Position/Activity Restrictions: LUE/LLE significantly weaker than R side.       Mobility Bed Mobility                  Transfers                      Balance                                           ADL either performed or assessed with clinical judgement   ADL                                               Vision       Perception     Praxis      Cognition Arousal/Alertness: Awake/alert Behavior During  Therapy: WFL for tasks assessed/performed Overall Cognitive Status: Within Functional Limits for tasks assessed                                 General Comments: Highly motivated to participate in therapy        Exercises Other Exercises Other Exercises: provided orange and green theraband, pt. able to demonstrate B UE exercises she remembers from [facility].  added 3 additional exercises for her to try and rec. 3x a day. pt. had a pad/pen and took notes so she would remember what addtional exercises to complete   Shoulder Instructions       General Comments  has twin boys 35 years old    Pertinent Vitals/ Pain       Pain Assessment: Faces Faces Pain Scale: Hurts little more Pain Location: B les Pain Descriptors / Indicators: Burning;Sharp;Shooting;Sore Pain Intervention(s): Limited activity within  patient's tolerance;Monitored during session  Home Living                                          Prior Functioning/Environment              Frequency           Progress Toward Goals  OT Goals(current goals can now be found in the care plan section)  Progress towards OT goals: Progressing toward goals     Plan      Co-evaluation                 AM-PAC OT "6 Clicks" Daily Activity     Outcome Measure                    End of Session Equipment Utilized During Treatment: Other (comment)(theraband)      Activity Tolerance     Patient Left     Nurse Communication          Time: 5391-2258 OT Time Calculation (min): 17 min  Charges: OT General Charges $OT Visit: 1 Visit OT Treatments $Therapeutic Exercise: 8-22 mins   Janice Coffin, COTA/L 12/09/2018, 11:00 AM

## 2018-12-09 NOTE — Care Management (Signed)
Spoke w patient, she is active w Nurse, learning disability for [facility]. Notified hospital liaison that patient will DC today. No other CM needs

## 2018-12-09 NOTE — Progress Notes (Signed)
NIF -30, VC 1.7L with great patient effort.

## 2018-12-09 NOTE — Discharge Summary (Signed)
Physician Discharge Summary  Kimberly [name] KZL:[id number] DOB: 1986-10-03 DOA: 12/06/2018  PCP: [name], MD  Admit date: 12/06/2018 Discharge date: 12/09/2018  Admitted From: Home Disposition:  Home  Recommendations for Outpatient Follow-up:  1. Follow up with PCP in 1 week 2. Follow up with Neurology Dr. Jaynee Eagles as scheduled on 2/7 3. Follow up with Cardiology Dr. [name]    Home Health: PT OT   Discharge Condition: Stable CODE STATUS: Full  Diet recommendation: Regular   Brief/Interim Summary: Kimberly [name] a 33 y.o.femalewith medical history significant ofADD, Anxiety, IBS, GERD, Tachycardia,history of hypokalemia,and other comorbidities presents with similar symptoms of when she was discharged last with acsending numbness, lower extremity weakness and ataxia as well as frequent falls. She was called by the neurologist and told her to go to her PCP for B1 replacement as her B1 level was severely low. Recently she was hospitalized for concern for Guillain Barr versus nutritional deficiency and she was given 5 days of IVIG. Symptoms had not improved significantly and she reports numbness of her body below her chest. PT evaluated and recommended home health PT and neurology had cleared her for discharge with outpatient follow-up will go for neurology at that time.Patient states that the numbness started January 2 and progressively worsened but is not worse than when she received IVIG. Since her hospitalization she is fallen at least 5-6 times and ambulates with a walker and a wheelchair sometimes. Her main complaint has now been lower extremity pain that has been shooting and burning. She states that she takes gabapentin for this. Recently talked with neurology who recommended her to go to the her PCP for thiamine replacement given her B1 levels but since PCP is unable to do this she was referred to the ED for admission.   Discharge Diagnoses:  Dry Beriberi   -Recently admitted forweakness and numbness or concern forGuillian-Barrand wasstartedon IVIG for 5 days of therapy. Further work-up reveals that she has severe thiamine deficiency -Patient has a follow-up appointment withDr. Lavell Anchors of neurology and plan is for outpatient EMG nerve conduction study -Neurology recommended 3 days of 500 mg of IV thiamine 3 times daily. Completed prior to discharge. Continue thiamine PO at discharge -PT OT rec home health PT OT   Tobacco abuse -Smoking cessation counseling  Sinus tachycardia -States ranges from 100-125 normally -Had an event monitor in December, revealed no significant arrhythmia -Telemetry reviewed independently, ranges from normal sinus rhythm to sinus tachycardia  GERD -PPI  Discharge Instructions  Discharge Instructions    Call MD for:  difficulty breathing, headache or visual disturbances   Complete by:  As directed    Call MD for:  extreme fatigue   Complete by:  As directed    Call MD for:  hives   Complete by:  As directed    Call MD for:  persistant dizziness or light-headedness   Complete by:  As directed    Call MD for:  persistant nausea and vomiting   Complete by:  As directed    Call MD for:  severe uncontrolled pain   Complete by:  As directed    Call MD for:  temperature >100.4   Complete by:  As directed    Diet general   Complete by:  As directed    Discharge instructions   Complete by:  As directed    You were cared for by a hospitalist during your hospital stay. If you have any questions about your discharge medications or  the care you received while you were in the hospital after you are discharged, you can call the unit and ask to speak with the hospitalist on call if the hospitalist that took care of you is not available. Once you are discharged, your primary care physician will handle any further medical issues. Please note that NO REFILLS for any discharge medications will be authorized once you  are discharged, as it is imperative that you return to your primary care physician (or establish a relationship with a primary care physician if you do not have one) for your aftercare needs so that they can reassess your need for medications and monitor your lab values.   Face-to-face encounter (required for Medicare/Medicaid patients)   Complete by:  As directed    I Dessa Phi certify that this patient is under my care and that I, or a nurse practitioner or physician's assistant working with me, had a face-to-face encounter that meets the physician face-to-face encounter requirements with this patient on 12/09/2018. The encounter with the patient was in whole, or in part for the following medical condition(s) which is the primary reason for home health care (List medical condition): Thiamine deficiency   The encounter with the patient was in whole, or in part, for the following medical condition, which is the primary reason for home health care:  Thiamine deficiency   I certify that, based on my findings, the following services are medically necessary home health services:  Physical therapy   Reason for Medically Necessary Home Health Services:   Therapy- Home Adaptation to Facilitate Safety Therapy- Therapeutic Exercises to Increase Strength and Endurance     My clinical findings support the need for the above services:  Unable to leave home safely without assistance and/or assistive device   Further, I certify that my clinical findings support that this patient is homebound due to:  Unable to leave home safely without assistance   Home Health   Complete by:  As directed    To provide the following care/treatments:   PT OT     Increase activity slowly   Complete by:  As directed      Allergies as of 12/09/2018      Reactions   Tape Itching, Other (See Comments)   Clear, "plastic" tape is the culprit      Medication List    STOP taking these medications   HYDROcodone-acetaminophen  5-325 MG tablet Commonly known as:  NORCO/VICODIN   ondansetron 4 MG disintegrating tablet Commonly known as:  ZOFRAN ODT     TAKE these medications   calcium carbonate 750 MG chewable tablet Commonly known as:  TUMS EX Chew 1-2 tablets by mouth as needed (for heartburn or indigestion).   gabapentin 300 MG capsule Commonly known as:  NEURONTIN Take 2 capsules (600 mg total) by mouth 3 (three) times daily for 30 days. What changed:    medication strength  how much to take  how to take this  when to take this  additional instructions   hydrocortisone 2.5 % rectal cream Commonly known as:  ANUSOL-HC Place 1 application rectally 2 (two) times daily. Use for 14 days What changed:    when to take this  reasons to take this  additional instructions   ibuprofen 200 MG tablet Commonly known as:  ADVIL,MOTRIN Take 800 mg by mouth every 6 (six) hours as needed for mild pain or moderate pain.   levonorgestrel 20 MCG/24HR IUD Commonly known as:  MIRENA 1 each  by Intrauterine route once.   loperamide 2 MG capsule Commonly known as:  IMODIUM Take 2 mg by mouth as needed for diarrhea or loose stools.   multivitamin tablet Take 1 tablet by mouth daily.   ondansetron 4 MG tablet Commonly known as:  ZOFRAN Take every 4-6 hours as needed. What changed:    how much to take  how to take this  when to take this  additional instructions   oxyCODONE-acetaminophen 5-325 MG tablet Commonly known as:  PERCOCET/ROXICET Take 1 tablet by mouth every 6 (six) hours as needed for up to 5 days for severe pain.   pantoprazole 40 MG tablet Commonly known as:  PROTONIX TAKE 1 TABLET (40 MG TOTAL) BY MOUTH DAILY. TAKE 30-60 MINUTES BEFORE BREAKFAST. What changed:    when to take this  additional instructions   polyethylene glycol packet Commonly known as:  MIRALAX / GLYCOLAX Take 17 g by mouth daily as needed for mild constipation.   pregabalin 50 MG capsule Commonly  known as:  LYRICA Take 1 capsule (50 mg total) by mouth 3 (three) times daily for 30 days.   thiamine 100 MG tablet Commonly known as:  VITAMIN B-1 Take 1 tablet (100 mg total) by mouth daily.   VIBERZI 75 MG Tabs Generic drug:  Eluxadoline Take 75 mg by mouth daily as needed (for symptoms- diarrhea).      Follow-up Information    [name], MD .   Specialty:  Family Medicine Contact information: [address] [place] [address] [phone]        Melvenia Beam, MD. Go on 12/14/2018.   Specialty:  Neurology Contact information: [place] [address] [phone]        Minus Breeding, MD Follow up.   Specialty:  Cardiology Contact information: [address] [place] [address] [phone]          Allergies  Allergen Reactions  . Tape Itching and Other (See Comments)    Clear, "plastic" tape is the culprit    Consultations:  None   Procedures/Studies: Dg Chest Portable 1 View  Result Date: 12/06/2018 CLINICAL DATA:  Tachycardia.  Lower extremity edema. EXAM: PORTABLE CHEST 1 VIEW COMPARISON:  Radiographs of November 19, 2018. FINDINGS: The heart size and mediastinal contours are within normal limits. Both lungs are clear. The visualized skeletal structures are unremarkable. IMPRESSION: No active disease. Electronically Signed   By: Marijo Conception, M.D.   On: 12/06/2018 16:25     Discharge Exam: Vitals:   12/09/18 0302 12/09/18 0800  BP: (!) 152/111 (!) 153/120  Pulse: (!) 102 99  Resp: 18 18  Temp: 98.5 F (36.9 C) 98.9 F (37.2 C)  SpO2: 100% 100%    General: Pt is alert, awake, not in acute distress Cardiovascular: tachycardic, regular rhythm, S1/S2 +, no rubs, no gallops Respiratory: CTA bilaterally, no wheezing, no rhonchi Abdominal: Soft, NT, ND, bowel sounds + Extremities: no edema, no cyanosis    The results of significant diagnostics from this hospitalization (including  imaging, microbiology, ancillary and laboratory) are listed below for reference.     Microbiology: No results found for this or any previous visit (from the past 240 hour(s)).   Labs: BNP (last 3 results) No results for input(s): BNP in the last 8760 hours. Basic Metabolic Panel: Recent Labs  Lab 12/04/18 0130 12/06/18 1601 12/07/18 0524  NA 134* 136 141  K 3.5 3.9 3.9  CL 103 108  113*  CO2 21* 17* 21*  GLUCOSE 108* 124* 92  BUN 12 7 6   CREATININE 0.56 0.59 0.58  CALCIUM 9.2 9.7 8.9  MG  --  1.8 1.7  PHOS  --  4.9* 4.5   Liver Function Tests: Recent Labs  Lab 12/04/18 0130 12/06/18 1601 12/07/18 0524  AST 34 37 33  ALT 32 35 29  ALKPHOS 43 45 40  BILITOT 0.7 0.5 0.7  PROT 7.6 8.1 6.6  ALBUMIN 3.5 3.9 3.1*   No results for input(s): LIPASE, AMYLASE in the last 168 hours. No results for input(s): AMMONIA in the last 168 hours. CBC: Recent Labs  Lab 12/04/18 0130 12/06/18 1601 12/07/18 0524  WBC 7.8 7.8 6.2  NEUTROABS 4.4 4.7  --   HGB 13.8 15.1* 13.8  HCT 43.6 46.1* 41.4  MCV 103.3* 101.8* 101.0*  PLT 297 313 303   Cardiac Enzymes: No results for input(s): CKTOTAL, CKMB, CKMBINDEX, TROPONINI in the last 168 hours. BNP: Invalid input(s): POCBNP CBG: Recent Labs  Lab 12/06/18 1625  GLUCAP 125*   D-Dimer No results for input(s): DDIMER in the last 72 hours. Hgb A1c No results for input(s): HGBA1C in the last 72 hours. Lipid Profile No results for input(s): CHOL, HDL, LDLCALC, TRIG, CHOLHDL, LDLDIRECT in the last 72 hours. Thyroid function studies Recent Labs    12/06/18 1820  TSH 1.618   Anemia work up No results for input(s): VITAMINB12, FOLATE, FERRITIN, TIBC, IRON, RETICCTPCT in the last 72 hours. Urinalysis    Component Value Date/Time   COLORURINE YELLOW 12/06/2018 1629   APPEARANCEUR HAZY (A) 12/06/2018 1629   LABSPEC 1.012 12/06/2018 1629   PHURINE 5.0 12/06/2018 1629   GLUCOSEU NEGATIVE 12/06/2018 1629   HGBUR SMALL (A)  12/06/2018 1629   BILIRUBINUR NEGATIVE 12/06/2018 1629   KETONESUR NEGATIVE 12/06/2018 1629   PROTEINUR NEGATIVE 12/06/2018 1629   UROBILINOGEN 0.2 06/03/2014 1311   NITRITE NEGATIVE 12/06/2018 1629   LEUKOCYTESUR NEGATIVE 12/06/2018 1629   Sepsis Labs Invalid input(s): PROCALCITONIN,  WBC,  LACTICIDVEN Microbiology No results found for this or any previous visit (from the past 240 hour(s)).   Patient was seen and examined on the day of discharge and was found to be in stable condition. Time coordinating discharge: 25 minutes including assessment and coordination of care, as well as examination of the patient.   SIGNED:  Dessa Phi, DO [facility] [email or web address] 12/09/2018, 8:53 AM

## 2018-12-11 DIAGNOSIS — Z87440 Personal history of urinary (tract) infections: Secondary | ICD-10-CM | POA: Diagnosis not present

## 2018-12-11 DIAGNOSIS — F909 Attention-deficit hyperactivity disorder, unspecified type: Secondary | ICD-10-CM | POA: Diagnosis not present

## 2018-12-11 DIAGNOSIS — F419 Anxiety disorder, unspecified: Secondary | ICD-10-CM | POA: Diagnosis not present

## 2018-12-11 DIAGNOSIS — M5135 Other intervertebral disc degeneration, thoracolumbar region: Secondary | ICD-10-CM | POA: Diagnosis not present

## 2018-12-11 DIAGNOSIS — R202 Paresthesia of skin: Secondary | ICD-10-CM | POA: Diagnosis not present

## 2018-12-13 DIAGNOSIS — R202 Paresthesia of skin: Secondary | ICD-10-CM | POA: Diagnosis not present

## 2018-12-13 DIAGNOSIS — M5135 Other intervertebral disc degeneration, thoracolumbar region: Secondary | ICD-10-CM | POA: Diagnosis not present

## 2018-12-13 DIAGNOSIS — F419 Anxiety disorder, unspecified: Secondary | ICD-10-CM | POA: Diagnosis not present

## 2018-12-13 DIAGNOSIS — F909 Attention-deficit hyperactivity disorder, unspecified type: Secondary | ICD-10-CM | POA: Diagnosis not present

## 2018-12-13 DIAGNOSIS — Z87440 Personal history of urinary (tract) infections: Secondary | ICD-10-CM | POA: Diagnosis not present

## 2018-12-14 ENCOUNTER — Ambulatory Visit: Payer: BLUE CROSS/BLUE SHIELD | Admitting: Neurology

## 2018-12-14 ENCOUNTER — Encounter: Payer: Self-pay | Admitting: Neurology

## 2018-12-14 ENCOUNTER — Encounter

## 2018-12-14 VITALS — BP 134/100 | HR 115 | Ht 67.5 in | Wt 150.0 lb

## 2018-12-14 DIAGNOSIS — G610 Guillain-Barre syndrome: Secondary | ICD-10-CM

## 2018-12-14 DIAGNOSIS — G629 Polyneuropathy, unspecified: Secondary | ICD-10-CM

## 2018-12-14 MED ORDER — PREGABALIN 150 MG PO CAPS: 150.0000 mg | ORAL_CAPSULE | Freq: Three times a day (TID) | ORAL | 5 refills | Status: DC

## 2018-12-14 MED ORDER — OXYCODONE-ACETAMINOPHEN 5-325 MG PO TABS: 1.0000 | ORAL_TABLET | Freq: Four times a day (QID) | ORAL | 0 refills | Status: AC | PRN

## 2018-12-14 NOTE — Progress Notes (Signed)
[facility]    Provider:  Dr Jaynee Eagles Referring Provider: [name], MD Primary Care Physician:  [name], MD  CC:  Ascending numbness and weakness and gait abnormality  Interval History: She is doing well. She was discharged from the hospital about 6 days ago. She was readmitted for concerns of thiamin deficiency. She is slowly regaining feeling. She can feel in bilateral feet and hands. She can feel pressure most everywhere else with exception of her abdomen. She continues thiamin, gabapentin and Lyrica. She does continue to have numbness, tingling and shooting pains. She is working with PT. She has finished therapy with OT. She is requesting a handicap placard and refill of her oxycodone-acetaminophen. She continues this q6hr for pain.    HPI:  Kimberly [name] is a 33 y.o. female here as requested by Dr. Hilma Favors for numbness. PMHx depression. Swelling in the feet and legs for several months, placed on diuretic around the end of the year, she became dehydrated. The pain started when the swelling reduced. She was significantly swollen, she "didn't have ankles", then they went numb in the area of the swelling. But then it started moving up the legs 5 days ago and now is up to her chest and her left shoulder and arm is tingly but she is numb. Now her mouth is numb. She has "serious weakness", she can barely walk or hold onto herself a lot of times. When she is walking she feels like her legs are going to come out from beneath her. She has not fallen. Still progressing. She is tight when she lays down flat but she cant feel anything from the neck down.   Reviewed notes, labs and imaging from outside physicians, which showed:  Patient presented on 11/06/2018 with bilateral leg pain and swelling of the ankles and feet.  Reviewed Dr. Delanna Ahmadi notes.  She was put on Lasix and had hypokalemia.  She was prescribed potassium.  Went to ED and diagnosed with dehydration.  Urine  culture positive for E. coli but patient denied treatment was told UA in hospital normal.  Labs also showed elevated MCV of 106.  Prior history of alcoholism treated 2 years ago and denies current use.  Believes she was tested for B12 and folate no lab results in chart TSH was normal in July 2019.  Still complaining of nausea and vomiting.TSH 0.78. B12, folate was drawn no results yet.     Review of Systems: Patient complains of symptoms per HPI as well as the following symptoms: numbness, weakness, restless legs. Pertinent negatives and positives per HPI. All others negative.   Social History   Socioeconomic History  . Marital status: Married    Spouse name: Not on file  . Number of children: 2  . Years of education: Not on file  . Highest education level: Not on file  Occupational History  . Occupation: In home health   Social Needs  . Financial resource strain: Not on file  . Food insecurity:    Worry: Not on file    Inability: Not on file  . Transportation needs:    Medical: Not on file    Non-medical: Not on file  Tobacco Use  . Smoking status: Current Every Day Smoker    Packs/day: 0.50    Years: 10.00    Pack years: 5.00    Types: Cigarettes    Last attempt to quit: 01/31/2012    Years since quitting: 6.8  . Smokeless tobacco: Never Used  Substance and Sexual Activity  . Alcohol use: Yes    Comment: occasionally  . Drug use: No  . Sexual activity: Yes    Birth control/protection: I.U.D.    Comment: 1st intercourse 33 yo-More than 5 partners  Lifestyle  . Physical activity:    Days per week: Not on file    Minutes per session: Not on file  . Stress: Not on file  Relationships  . Social connections:    Talks on phone: Not on file    Gets together: Not on file    Attends religious service: Not on file    Active member of club or organization: Not on file    Attends meetings of clubs or organizations: Not on file    Relationship status: Not on file  . Intimate  partner violence:    Fear of current or ex partner: Not on file    Emotionally abused: Not on file    Physically abused: Not on file    Forced sexual activity: Not on file  Other Topics Concern  . Not on file  Social History Narrative   Lives with husband and three step children.      Family History  Problem Relation Age of Onset  . Depression Mother        chronic  . Hearing loss Mother   . Hyperlipidemia Mother   . Vision loss Mother        beachets disease  . COPD Father   . Diabetes Father   . Cancer Maternal Grandmother 5       breast cancer  . Parkinsonism Maternal Grandmother   . Heart disease Maternal Grandmother   . Breast cancer Maternal Grandmother   . Alzheimer's disease Paternal Grandmother   . Heart disease Paternal Grandfather        Died age 62  . Nephrolithiasis Paternal Grandfather   . Heart disease Maternal Grandfather   . Cancer Other 60       breast  . Breast cancer Other     Past Medical History:  Diagnosis Date  . Abnormal Pap smear   . ADD (attention deficit disorder)   . Anxiety   . Family history of anesthesia complication    mother has difficult  time going to sleep  . Guillain Barr syndrome ([place])   . Headache(784.0)   . HSV-1 infection   . IBS (irritable bowel syndrome)   . Tachycardia     Past Surgical History:  Procedure Laterality Date  . BREAST BIOPSY Right   . BREAST SURGERY     Biopsy-benign  . CESAREAN SECTION  09/13/2012   Procedure: CESAREAN SECTION;  Surgeon: Lavonia Drafts, MD;  Location: [facility];  Service: Obstetrics;  Laterality: N/A;  . KYPHOPLASTY N/A 08/27/2013   Procedure: Thoracic Twelve Kyphoplasty;  Surgeon: Erline Levine, MD;  Location: [facility];  Service: Neurosurgery;  Laterality: N/A;  T12 Kyphoplasty  . WISDOM TOOTH EXTRACTION     x4  . WRIST SURGERY Right 07-23-2013    Current Outpatient Medications  Medication Sig Dispense Refill  . calcium carbonate (TUMS EX) 750 MG chewable tablet Chew  1-2 tablets by mouth as needed (for heartburn or indigestion).    . Eluxadoline (VIBERZI) 75 MG TABS Take 75 mg by mouth daily as needed (for symptoms- diarrhea).     . hydrocortisone (ANUSOL-HC) 2.5 % rectal cream Place 1 application rectally 2 (two) times daily. Use for 14 days (Patient taking differently: Place 1 application rectally as  needed for hemorrhoids or anal itching. ) 30 g 1  . ibuprofen (ADVIL,MOTRIN) 200 MG tablet Take 800 mg by mouth every 6 (six) hours as needed for mild pain or moderate pain.    Marland Kitchen levonorgestrel (MIRENA) 20 MCG/24HR IUD 1 each by Intrauterine route once.    . loperamide (IMODIUM) 2 MG capsule Take 2 mg by mouth as needed for diarrhea or loose stools.     . Multiple Vitamin (MULTIVITAMIN) tablet Take 1 tablet by mouth daily.    . ondansetron (ZOFRAN) 4 MG tablet Take every 4-6 hours as needed. (Patient taking differently: Take 4 mg by mouth See admin instructions. Take 4 mg by mouth every 4-6 hours as needed for nausea or vomiting) 30 tablet 1  . oxyCODONE-acetaminophen (PERCOCET/ROXICET) 5-325 MG tablet Take 1 tablet by mouth every 6 (six) hours as needed for up to 7 days for severe pain. 20 tablet 0  . pantoprazole (PROTONIX) 40 MG tablet TAKE 1 TABLET (40 MG TOTAL) BY MOUTH DAILY. TAKE 30-60 MINUTES BEFORE BREAKFAST. (Patient taking differently: Take 40 mg by mouth daily before breakfast. ) 30 tablet 1  . polyethylene glycol (MIRALAX / GLYCOLAX) packet Take 17 g by mouth daily as needed for mild constipation.    . thiamine (VITAMIN B-1) 100 MG tablet Take 1 tablet (100 mg total) by mouth daily. 30 tablet 0  . pregabalin (LYRICA) 150 MG capsule Take 1 capsule (150 mg total) by mouth 3 (three) times daily. 90 capsule 5   No current facility-administered medications for this visit.     Allergies as of 12/14/2018 - Review Complete 12/14/2018  Allergen Reaction Noted  . Tape Itching and Other (See Comments) 12/06/2018    Vitals: BP (!) 134/100   Pulse (!) 115    Ht 5' 7.5" (1.715 m)   Wt 150 lb (68 kg)   BMI 23.15 kg/m  Last Weight:  Wt Readings from Last 1 Encounters:  12/14/18 150 lb (68 kg)   Last Height:   Ht Readings from Last 1 Encounters:  12/14/18 5' 7.5" (1.715 m)    Physical exam: Exam: Gen: NAD, conversant                     CV: RRR, no MRG. No Carotid Bruits. No peripheral edema, warm, nontender Eyes: Conjunctivae clear without exudates or hemorrhage  Neuro: Detailed Neurologic Exam  Speech:    Speech is normal; fluent and spontaneous with normal comprehension.  Cognition:    The patient is oriented to person, place, and time;     recent and remote memory intact;     language fluent;     normal attention, concentration,     fund of knowledge Cranial Nerves:    The pupils are equal, round, and reactive to light. The fundi are normal and spontaneous venous pulsations are present. Visual fields are full to finger confrontation. Extraocular movements are intact. Trigeminal sensation is intact and the muscles of mastication are normal. The face is symmetric. The palate elevates in the midline. Hearing intact. Voice is normal. Shoulder shrug is normal. The tongue has normal motion without fasciculations.   Coordination:    Dysmetria left arm with FTN   Gait:    Ataxic but improved since last being seen  Motor Observation:    No asymmetry, no atrophy, and no involuntary movements noted. Tone:    Normal muscle tone.    Posture:    Posture is normal. normal erect    Strength:  Mild lower extremity weakness more proximally 4/5 hip flexion and 5- leg flexion and triceps otherwise appears intact strength and symmetric     Sensation: sensory level approx T5     Reflex Exam:  DTR's:    Deep tendon reflexes hyporeflexic in the and lower extremities and brisk at biceps bilaterally.   Toes:    The toes are downgoing bilaterally.   Clonus:    Clonus is absent.      Assessment/Plan:  Patient previously with  ascending numbness and weakness, states she cannot walk or feel her chest from the breasts down, sensory level around T4-T5, ataxic on exam, hyporeflexic in the lowers but brisk uppers. Sent to the ED and diagnosed with GBS, treated with IVIG improving.  - prior substance abuse, I am very concerned about her opioid usage. I refilled for one week given post-GBS can be very painful but she needs to follow up with pcp or pain management for extremely close monitoring and titrating off in less than 4 weeks. Can be managed with other medications such as Gabapentin and Lyrica. Discussed with patient and husband. - Continue PT - Need to monitor for CIDP but given improvement I do not think this will occur.  A total of 40 minutes was spent face-to-face with this patient. Over half this time was spent on counseling patient on the  1. Guillain Barr syndrome ([place])   2. Neuropathy     diagnosis and different diagnostic and therapeutic options, counseling and coordination of care, risks ans benefits of management, compliance, or risk factor reduction and education.    cc: Dr. Dolphus Jenny, MD  [facility] [address] [place], [place] [address]  Phone [phone] Fax [phone]

## 2018-12-14 NOTE — Patient Instructions (Addendum)
Stop gabapentin. Increase Lyrica to 150 three times daily.  Handicap placard for 6 months Refill oxycodone for 1 week only, will need to discuss with primary care or potentially pain management clinic Follow up in 3 months.   Guillain-Barr Syndrome Guillain-Barr syndrome (GBS) is a rare disorder in which the body's disease-fighting system (immune system) attacks the nerves. This causes numbness and tingling. It can eventually develop into a serious condition and, in some cases, cause paralysis. GBS does not spread from person to person (is not contagious). Most people with GBS recover within a few months. However, some people may have muscle weakness that lasts for a few years. What are the causes? The exact cause of GBS is not known. In most cases, GBS develops a few days or weeks after you have an infection, such as:  A stomach infection caused by Campylobacter bacteria. This type of infection usually causes diarrhea.  An infection of the nose, throat, and upper airways (respiratory infection), such as a cold or the flu.  A viral infection. Less commonly, GBS may be triggered by:  Surgery.  A vaccine. What are the signs or symptoms? The most common first symptoms are tingling, numbness, and weakness in the lower legs. Other symptoms may develop over hours, days, or weeks. These may include:  Muscle weakness that spreads from the legs to the abdomen and arms.  Aching or burning pain.  Weakness of muscles in the face.  Trouble chewing or swallowing.  Slurred speech.  Inability to move the arms, legs, or both (paralysis).  Difficulty breathing. How is this diagnosed? This condition may be diagnosed based on:  Your symptoms and medical history. Your health care provider will ask about any recent infections you have had.  A physical exam.  A test that measures how quickly your nerves carry signals to your muscles (nerve conduction velocity test).  Testing a sample of  fluid that surrounds the spinal cord (lumbar puncture). How is this treated? If your symptoms are mild, you may be given medicines to control your symptoms. If your condition becomes severe, you may need to be treated at a hospital. At the hospital, treatment may include:  Medicines. These help to improve numbness or tingling sensations in your arms and legs (paresthesias) caused by irritation of the nerves.  Plasma exchange (plasmapheresis). This is a procedure in which the immune system cells that are attacking your nerves are removed from your blood.  Getting proteins (immunoglobulins or antibodies) that slow down the immune system's attack on your nerves. These may be given through an IV inserted into one of your veins.  Oxygen and breathing assistance. Treatment may also include physical therapy to help strengthen your muscles. After being treated in the hospital, you may be transferred to a rehabilitation center to get intensive physical therapy. Once your strength improves, you may continue to get physical therapy at home. Additional physical therapy may be needed for many months. Follow these instructions at home:   If physical therapy was prescribed, do exercises as told by your health care provider.  Make sure you have the support you need. Someone may need to help you bathe, dress, and prepare meals until you regain your strength.  Rest as needed. Return to your normal activities as told by your health care provider. Ask your health care provider what activities are safe for you.  Take over-the-counter and prescription medicines only as told by your health care provider.  Keep all follow-up visits as told by  your health care provider. This is important. Contact a health care provider if you:  Have new symptoms or your symptoms get worse.  Do not feel like you are getting better or regaining strength.  Do not feel safe or supported at home.  Are having trouble coping with  your recovery. Get help right away if you:  Have trouble breathing.  Have trouble swallowing.  Develop a fever.  Choke after eating or drinking.  Cannot move.  Faint.  Have pain, swelling, warmth, or redness in an arm or leg.  Have chest pain. Summary  Guillain-Barr syndrome (GBS) is a rare disorder in which the body's disease-fighting system (immune system) attacks the nerves.  GBS often requires treatment at a hospital. Treatment in the hospital may include medicines, plasmapheresis, getting proteins that slow down the immune system's attack on your nerves, or oxygen and breathing assistance.  Months of physical therapy may be needed until you regain your strength. This information is not intended to replace advice given to you by your health care provider. Make sure you discuss any questions you have with your health care provider. Document Released: 10/14/2002 Document Revised: 10/29/2017 Document Reviewed: 10/29/2017 Elsevier Interactive Patient Education  2019 [employer].

## 2018-12-16 ENCOUNTER — Encounter: Payer: Self-pay | Admitting: Neurology

## 2018-12-17 ENCOUNTER — Telehealth: Payer: Self-pay | Admitting: Neurology

## 2018-12-17 NOTE — Telephone Encounter (Signed)
Noted thank you

## 2018-12-17 NOTE — Telephone Encounter (Signed)
Per Dr. Jaynee Eagles, checked [place] registry. Pt filled percocet and Lyrica on 12/09/2018. Per Dr. Jaynee Eagles, pt sees her pcp tomorrow. Will not refill at this time. Will notify pharmacy.

## 2018-12-17 NOTE — Telephone Encounter (Signed)
Pt called the power was out at [facility] on Friday, therefore they did not receive the refills for oxyCODONE-acetaminophen (PERCOCET/ROXICET) 5-325 MG tablet and pregabalin (LYRICA) 150 MG capsule. It does show on both refills the transmission failed. Please resend.

## 2018-12-17 NOTE — Telephone Encounter (Signed)
Pt has called to inquire about Dr Jaynee Eagles refilling her percocet and Lyrica .  RN Romelle Starcher gave the okay for the message from 9:21 this morning to be relayed to pt.  It was read, pt stated thank you and the call ended.  No call back requested.

## 2018-12-17 NOTE — Telephone Encounter (Signed)
I called [facility] and confirmed the [place] did not go through. I also notified the pharmacist that Dr. Jaynee Eagles will not be refilling them after all. She verbalized understanding.   Per Dr. Jaynee Eagles, she will not refill those medications. Pt has had 4 prescriptions of opioids in the past month. She already has recently filled prescription of Lyrica #90 which she should be taking. She has an appt with PCP tomorrow so she can discuss her medications with PCP.

## 2018-12-18 DIAGNOSIS — E5111 Dry beriberi: Secondary | ICD-10-CM | POA: Diagnosis not present

## 2018-12-18 DIAGNOSIS — Z6823 Body mass index (BMI) 23.0-23.9, adult: Secondary | ICD-10-CM | POA: Diagnosis not present

## 2018-12-18 DIAGNOSIS — G650 Sequelae of Guillain-Barre syndrome: Secondary | ICD-10-CM | POA: Diagnosis not present

## 2018-12-18 DIAGNOSIS — M6281 Muscle weakness (generalized): Secondary | ICD-10-CM | POA: Diagnosis not present

## 2018-12-18 DIAGNOSIS — Z1389 Encounter for screening for other disorder: Secondary | ICD-10-CM | POA: Diagnosis not present

## 2018-12-21 DIAGNOSIS — F419 Anxiety disorder, unspecified: Secondary | ICD-10-CM | POA: Diagnosis not present

## 2018-12-21 DIAGNOSIS — Z87440 Personal history of urinary (tract) infections: Secondary | ICD-10-CM | POA: Diagnosis not present

## 2018-12-21 DIAGNOSIS — R202 Paresthesia of skin: Secondary | ICD-10-CM | POA: Diagnosis not present

## 2018-12-21 DIAGNOSIS — M5135 Other intervertebral disc degeneration, thoracolumbar region: Secondary | ICD-10-CM | POA: Diagnosis not present

## 2018-12-21 DIAGNOSIS — F909 Attention-deficit hyperactivity disorder, unspecified type: Secondary | ICD-10-CM | POA: Diagnosis not present

## 2018-12-26 DIAGNOSIS — F419 Anxiety disorder, unspecified: Secondary | ICD-10-CM | POA: Diagnosis not present

## 2018-12-26 DIAGNOSIS — M5135 Other intervertebral disc degeneration, thoracolumbar region: Secondary | ICD-10-CM | POA: Diagnosis not present

## 2018-12-26 DIAGNOSIS — Z87440 Personal history of urinary (tract) infections: Secondary | ICD-10-CM | POA: Diagnosis not present

## 2018-12-26 DIAGNOSIS — F909 Attention-deficit hyperactivity disorder, unspecified type: Secondary | ICD-10-CM | POA: Diagnosis not present

## 2018-12-26 DIAGNOSIS — R202 Paresthesia of skin: Secondary | ICD-10-CM | POA: Diagnosis not present

## 2018-12-27 DIAGNOSIS — G610 Guillain-Barre syndrome: Secondary | ICD-10-CM | POA: Diagnosis not present

## 2018-12-28 DIAGNOSIS — M5135 Other intervertebral disc degeneration, thoracolumbar region: Secondary | ICD-10-CM | POA: Diagnosis not present

## 2018-12-28 DIAGNOSIS — Z87440 Personal history of urinary (tract) infections: Secondary | ICD-10-CM | POA: Diagnosis not present

## 2018-12-28 DIAGNOSIS — F419 Anxiety disorder, unspecified: Secondary | ICD-10-CM | POA: Diagnosis not present

## 2018-12-28 DIAGNOSIS — R202 Paresthesia of skin: Secondary | ICD-10-CM | POA: Diagnosis not present

## 2018-12-28 DIAGNOSIS — F909 Attention-deficit hyperactivity disorder, unspecified type: Secondary | ICD-10-CM | POA: Diagnosis not present

## 2018-12-31 ENCOUNTER — Other Ambulatory Visit: Payer: Self-pay | Admitting: Physician Assistant

## 2019-01-02 DIAGNOSIS — F909 Attention-deficit hyperactivity disorder, unspecified type: Secondary | ICD-10-CM | POA: Diagnosis not present

## 2019-01-02 DIAGNOSIS — M5135 Other intervertebral disc degeneration, thoracolumbar region: Secondary | ICD-10-CM | POA: Diagnosis not present

## 2019-01-02 DIAGNOSIS — F419 Anxiety disorder, unspecified: Secondary | ICD-10-CM | POA: Diagnosis not present

## 2019-01-02 DIAGNOSIS — Z87440 Personal history of urinary (tract) infections: Secondary | ICD-10-CM | POA: Diagnosis not present

## 2019-01-02 DIAGNOSIS — R202 Paresthesia of skin: Secondary | ICD-10-CM | POA: Diagnosis not present

## 2019-01-04 DIAGNOSIS — M5135 Other intervertebral disc degeneration, thoracolumbar region: Secondary | ICD-10-CM | POA: Diagnosis not present

## 2019-01-04 DIAGNOSIS — F419 Anxiety disorder, unspecified: Secondary | ICD-10-CM | POA: Diagnosis not present

## 2019-01-04 DIAGNOSIS — Z87440 Personal history of urinary (tract) infections: Secondary | ICD-10-CM | POA: Diagnosis not present

## 2019-01-04 DIAGNOSIS — F909 Attention-deficit hyperactivity disorder, unspecified type: Secondary | ICD-10-CM | POA: Diagnosis not present

## 2019-01-04 DIAGNOSIS — R202 Paresthesia of skin: Secondary | ICD-10-CM | POA: Diagnosis not present

## 2019-01-13 ENCOUNTER — Other Ambulatory Visit: Payer: Self-pay | Admitting: Physician Assistant

## 2019-01-14 ENCOUNTER — Other Ambulatory Visit: Payer: Self-pay

## 2019-01-14 DIAGNOSIS — R203 Hyperesthesia: Secondary | ICD-10-CM | POA: Diagnosis not present

## 2019-01-14 DIAGNOSIS — R200 Anesthesia of skin: Secondary | ICD-10-CM | POA: Diagnosis not present

## 2019-01-14 DIAGNOSIS — G650 Sequelae of Guillain-Barre syndrome: Secondary | ICD-10-CM | POA: Diagnosis not present

## 2019-01-14 DIAGNOSIS — Z6823 Body mass index (BMI) 23.0-23.9, adult: Secondary | ICD-10-CM | POA: Diagnosis not present

## 2019-01-14 MED ORDER — PANTOPRAZOLE SODIUM 40 MG PO TBEC: DELAYED_RELEASE_TABLET | ORAL | 0 refills | Status: DC

## 2019-01-14 NOTE — Progress Notes (Signed)
Refill re-sent  

## 2019-01-17 DIAGNOSIS — R202 Paresthesia of skin: Secondary | ICD-10-CM | POA: Diagnosis not present

## 2019-01-17 DIAGNOSIS — F909 Attention-deficit hyperactivity disorder, unspecified type: Secondary | ICD-10-CM | POA: Diagnosis not present

## 2019-01-17 DIAGNOSIS — F419 Anxiety disorder, unspecified: Secondary | ICD-10-CM | POA: Diagnosis not present

## 2019-01-17 DIAGNOSIS — Z87440 Personal history of urinary (tract) infections: Secondary | ICD-10-CM | POA: Diagnosis not present

## 2019-01-17 DIAGNOSIS — M5135 Other intervertebral disc degeneration, thoracolumbar region: Secondary | ICD-10-CM | POA: Diagnosis not present

## 2019-01-18 ENCOUNTER — Ambulatory Visit: Payer: BLUE CROSS/BLUE SHIELD | Admitting: Neurology

## 2019-01-29 DIAGNOSIS — Z30432 Encounter for removal of intrauterine contraceptive device: Secondary | ICD-10-CM | POA: Diagnosis not present

## 2019-01-29 DIAGNOSIS — G650 Sequelae of Guillain-Barre syndrome: Secondary | ICD-10-CM | POA: Diagnosis not present

## 2019-01-29 DIAGNOSIS — H532 Diplopia: Secondary | ICD-10-CM | POA: Diagnosis not present

## 2019-01-29 DIAGNOSIS — Z1389 Encounter for screening for other disorder: Secondary | ICD-10-CM | POA: Diagnosis not present

## 2019-01-29 DIAGNOSIS — Z681 Body mass index (BMI) 19 or less, adult: Secondary | ICD-10-CM | POA: Diagnosis not present

## 2019-02-04 ENCOUNTER — Telehealth: Payer: Self-pay | Admitting: Neurology

## 2019-02-04 NOTE — Telephone Encounter (Signed)
Pt states she is having double vision and slurred speech and is not sure if it has to due to her [employer]. She also would like to know if she can adjust her nerve medication due to still having a lot of pain. Please advise

## 2019-02-04 NOTE — Telephone Encounter (Signed)
If it has been going on for 3 weeks and not significantly progressing then she should be ok to wait until early next month to see me in the office. If symptoms start significantly progressing or she has new symptoms, or she has concerning symptoms like shortness of breath, worsening weakness then she should go to the ED. She is on the highest dose of Lyrica and it appears she is getting high amounts of Tramadol so I could not prescribe any tramadol or opioids and if there is something else non-narcotic we can add I could not do that until I see her in the office. thanks

## 2019-02-04 NOTE — Telephone Encounter (Signed)
I spoke with patient and explained what Dr. Jaynee Eagles has advised. She is aware of when to call our office and when to go to ED. Pt agreed to plan. However she has asked for a letter saying she is under care and her condition has been observed. She said people think she is intoxicated. She stated that her medical doctor has seen her but they will not provide this and has advised her to see neurology.

## 2019-02-04 NOTE — Telephone Encounter (Signed)
I spoke with the patient. She stated symptoms started about 3 weeks ago. others noticed the slurred speech. Her speech does not really sound slurred right now on the phone. She said sometimes it's worse. She has noticed intermittent double vision. She said she is fine while driving but she notices it sometimes when she looks at something up close. She also stated that she still has a lot of nerve pain. She takes Lyrica 300 mg BID but no other pain medication. She still has numbness in her finger tips, torso, knees down, can feel her feet, not toes. Otherwise her feeling is improving some. I advised that I would send a message to Dr. Jaynee Eagles and call her back.

## 2019-02-04 NOTE — Telephone Encounter (Signed)
Spoke with Dr. Jaynee Eagles. Pt currently has f/u scheduled for Mar 25, 2019. Ok to leave as scheduled. If patient's symptoms slowly get worse will have pt call us. If her symptoms get acutely/significantly worse or she develops symptoms described below by MD, she should go to the ED.

## 2019-02-05 NOTE — Telephone Encounter (Addendum)
Spoke with Dr. Jaynee Eagles. Symptoms not concerning for GBS, not urgent. If worsens or develops new symptoms she needs to go to the ED. Will offer virtual appointment or telephone appt if pt unable to do video.   Spoke with patient. She verbalized understanding of message above. She consented to a virtual video visit with webex application. She consented to filing visit with insurance. She is aware that copay will be waived. Discussed instructions to download application on device that has camera function. Her email is [email or web address]. She is aware that email will be sent with invitation to join the meeting.

## 2019-02-05 NOTE — Telephone Encounter (Signed)
Spoke with Dr. Jaynee Eagles. Unable to write any letters until she sees her. Pt could video herself the next time this happens (slurred speech) and send through mychart.

## 2019-02-05 NOTE — Telephone Encounter (Signed)
Patient called office. She stated she is having another episode of slurred speech and double vision. She said this happens every other day, in the evening, lasts until she goes to bed. She said she sometimes has hand tremors/shaking too. Advised pt that as discussed before if she develops new symptoms (ex. SOB) or stroke symptoms to get to an ED. Informed pt I would notify Dr. Jaynee Eagles. Pt verbalized understanding & appreciation.

## 2019-02-05 NOTE — Telephone Encounter (Signed)
Spoke with patient and advised that as we had discussed yesterday Dr. Jaynee Eagles wouldn't be able to write a letter for something she has not seen. I advised pt that if she would like to setup a mychart account, she can upload a video of herself next time it happens. We will plan to see her on May 18th. She verbalized appreciation and understanding and was given the mychart help desk number to call if she would like.

## 2019-02-06 NOTE — Telephone Encounter (Signed)
Thank you :)

## 2019-02-06 NOTE — Telephone Encounter (Signed)
Ok, let me see her Tuesday and we can decide then about the May appoontment thanks

## 2019-02-07 NOTE — Telephone Encounter (Signed)
email was sent Tuesday 5:07 pm. Will send again. Pt's appt is Tuesday 02/12/2019 @ 4:00 pm.

## 2019-02-07 NOTE — Telephone Encounter (Signed)
I called the patient. She found the original message on her calendar, not in her email. Since Waller sent it again she will use that password and get it set up.

## 2019-02-07 NOTE — Telephone Encounter (Signed)
Pt did not get any information regarding the [facility] appt today. She can be reached at [phone]

## 2019-02-07 NOTE — Telephone Encounter (Signed)
Noted thanks °

## 2019-02-07 NOTE — Telephone Encounter (Signed)
Sent pt's meeting invitation to her email again.

## 2019-02-12 ENCOUNTER — Encounter: Payer: Self-pay | Admitting: Neurology

## 2019-02-12 ENCOUNTER — Other Ambulatory Visit: Payer: Self-pay

## 2019-02-12 ENCOUNTER — Ambulatory Visit (INDEPENDENT_AMBULATORY_CARE_PROVIDER_SITE_OTHER): Payer: BLUE CROSS/BLUE SHIELD | Admitting: Neurology

## 2019-02-12 DIAGNOSIS — E538 Deficiency of other specified B group vitamins: Secondary | ICD-10-CM

## 2019-02-12 DIAGNOSIS — R471 Dysarthria and anarthria: Secondary | ICD-10-CM | POA: Diagnosis not present

## 2019-02-12 DIAGNOSIS — E531 Pyridoxine deficiency: Secondary | ICD-10-CM

## 2019-02-12 DIAGNOSIS — H532 Diplopia: Secondary | ICD-10-CM

## 2019-02-12 DIAGNOSIS — E519 Thiamine deficiency, unspecified: Secondary | ICD-10-CM | POA: Insufficient documentation

## 2019-02-12 DIAGNOSIS — R270 Ataxia, unspecified: Secondary | ICD-10-CM | POA: Diagnosis not present

## 2019-02-12 DIAGNOSIS — F1910 Other psychoactive substance abuse, uncomplicated: Secondary | ICD-10-CM | POA: Insufficient documentation

## 2019-02-12 DIAGNOSIS — F1020 Alcohol dependence, uncomplicated: Secondary | ICD-10-CM

## 2019-02-12 DIAGNOSIS — F04 Amnestic disorder due to known physiological condition: Secondary | ICD-10-CM

## 2019-02-12 DIAGNOSIS — E46 Unspecified protein-calorie malnutrition: Secondary | ICD-10-CM

## 2019-02-12 MED ORDER — PRENATAL VITAMIN 27-0.8 MG PO TABS: 1.0000 | ORAL_TABLET | Freq: Every day | ORAL | 6 refills | Status: DC

## 2019-02-12 NOTE — Telephone Encounter (Signed)
Spoke with pt today. She received the email with meeting information for today's appt at 4 pm. She will use her tablet and is ready. She confirmed her name, dob. She provided updates/confirmed her med/social/sx/fam hx, allergies, medications, pharmacy.

## 2019-02-12 NOTE — Addendum Note (Signed)
Addended by: Gildardo Griffes on: 02/12/2019 11:34 AM   Modules accepted: Orders

## 2019-02-12 NOTE — Progress Notes (Addendum)
[facility]    Provider:  Dr Jaynee Eagles Referring Provider: [name], MD Primary Care Physician:  [name], MD  CC:  Ascending numbness and weakness and gait abnormality  Virtual Visit via Video Note  I connected with Kimberly [name] on 02/12/19 at  4:00 PM EDT by a video enabled telemedicine application and verified that I am speaking with the correct person using two identifiers. Patient at home, physician in the office.   I discussed the limitations of evaluation and management by telemedicine and the availability of in person appointments. The patient expressed understanding and agreed to proceed.  Interval history 02/12/2019: Patient with a prior history of opioid abuse, GBS recently this year admitted for IVIG then discharged, admitted again for possible thiamine deficiency.  She is feeling better. Overall she is improving. She is still numb from below her breast, her legs are numb and lower legs and feet and nerve pain.She can't feel her toes and fingers. She graduated from PT. She has 35 year old twin boys. She has started riding a bike.  She has been losing hair. This is not my specialty, I'm not aware Lyrica can do this but unsure, may be stress, advised to discuss with pcp because this is not my area of expertise. She has bald spots, recommended pcp and possibly derm. She has double vision, started in the hospital the first time but worsening. She will see objects side by side, worse up closer, closing left eye makes it go away. Her family thinks she is drunk a lot due to her walking and she sounds and looks intoxicated. These symptoms have been stable for 4 weeks.She has not been taking her B1 supplement because it was too high and then it was normal. Speech Therapy.   Interval History: She is doing well. She was discharged from the hospital about 6 days ago. She was readmitted for concerns of thiamin deficiency. She is slowly regaining feeling. She can feel in  bilateral feet and hands. She can feel pressure most everywhere else with exception of her abdomen. She continues thiamin, gabapentin and Lyrica. She does continue to have numbness, tingling and shooting pains. She is working with PT. She has finished therapy with OT. She is requesting a handicap placard and refill of her oxycodone-acetaminophen. She continues this q6hr for pain.    HPI:  Kimberly [name] is a 33 y.o. female here as requested by Dr. Hilma Favors for numbness. PMHx depression. Swelling in the feet and legs for several months, placed on diuretic around the end of the year, she became dehydrated. The pain started when the swelling reduced. She was significantly swollen, she "didn't have ankles", then they went numb in the area of the swelling. But then it started moving up the legs 5 days ago and now is up to her chest and her left shoulder and arm is tingly but she is numb. Now her mouth is numb. She has "serious weakness", she can barely walk or hold onto herself a lot of times. When she is walking she feels like her legs are going to come out from beneath her. She has not fallen. Still progressing. She is tight when she lays down flat but she cant feel anything from the neck down.   Reviewed notes, labs and imaging from outside physicians, which showed:  Patient presented on 11/06/2018 with bilateral leg pain and swelling of the ankles and feet.  Reviewed Dr. Delanna Ahmadi notes.  She was put on Lasix and  had hypokalemia.  She was prescribed potassium.  Went to ED and diagnosed with dehydration.  Urine culture positive for E. coli but patient denied treatment was told UA in hospital normal.  Labs also showed elevated MCV of 106.  Prior history of alcoholism treated 2 years ago and denies current use.  Believes she was tested for B12 and folate no lab results in chart TSH was normal in July 2019.  Still complaining of nausea and vomiting.TSH 0.78. B12, folate was drawn no results yet.     Review  of Systems: Patient complains of symptoms per HPI as well as the following symptoms: numbness, weakness, restless legs. Pertinent negatives and positives per HPI. All others negative.   Social History   Socioeconomic History  . Marital status: Married    Spouse name: Not on file  . Number of children: 2  . Years of education: Not on file  . Highest education level: Not on file  Occupational History  . Occupation: In home health   Social Needs  . Financial resource strain: Not on file  . Food insecurity:    Worry: Not on file    Inability: Not on file  . Transportation needs:    Medical: Not on file    Non-medical: Not on file  Tobacco Use  . Smoking status: Current Every Day Smoker    Packs/day: 0.50    Years: 10.00    Pack years: 5.00    Types: Cigarettes    Last attempt to quit: 01/31/2012    Years since quitting: 7.0  . Smokeless tobacco: Never Used  . Tobacco comment: pt has decreased her use to 1-2 cigarettes per day   Substance and Sexual Activity  . Alcohol use: Yes    Comment: occasionally  . Drug use: No  . Sexual activity: Yes    Birth control/protection: Pill    Comment: 1st intercourse 33 yo-More than 5 partners  Lifestyle  . Physical activity:    Days per week: Not on file    Minutes per session: Not on file  . Stress: Not on file  Relationships  . Social connections:    Talks on phone: Not on file    Gets together: Not on file    Attends religious service: Not on file    Active member of club or organization: Not on file    Attends meetings of clubs or organizations: Not on file    Relationship status: Not on file  . Intimate partner violence:    Fear of current or ex partner: Not on file    Emotionally abused: Not on file    Physically abused: Not on file    Forced sexual activity: Not on file  Other Topics Concern  . Not on file  Social History Narrative   Lives with husband and twin sons     Family History  Problem Relation Age of Onset   . Depression Mother        chronic  . Hearing loss Mother   . Hyperlipidemia Mother   . Vision loss Mother        beachets disease  . COPD Father   . Diabetes Father   . Cancer Maternal Grandmother 68       breast cancer  . Parkinsonism Maternal Grandmother   . Heart disease Maternal Grandmother   . Breast cancer Maternal Grandmother   . Alzheimer's disease Paternal Grandmother   . Heart disease Paternal Grandfather  Died age 24  . Nephrolithiasis Paternal Grandfather   . Heart disease Maternal Grandfather   . Cancer Other 60       breast  . Breast cancer Other     Past Medical History:  Diagnosis Date  . Abnormal Pap smear   . ADD (attention deficit disorder)   . Anxiety   . Family history of anesthesia complication    mother has difficult  time going to sleep  . Guillain Barr syndrome ([place])   . Headache(784.0)   . HSV-1 infection   . IBS (irritable bowel syndrome)   . Tachycardia     Past Surgical History:  Procedure Laterality Date  . BREAST BIOPSY Right   . BREAST SURGERY     Biopsy-benign  . CESAREAN SECTION  09/13/2012   Procedure: CESAREAN SECTION;  Surgeon: Lavonia Drafts, MD;  Location: [facility];  Service: Obstetrics;  Laterality: N/A;  . KYPHOPLASTY N/A 08/27/2013   Procedure: Thoracic Twelve Kyphoplasty;  Surgeon: Erline Levine, MD;  Location: [facility];  Service: Neurosurgery;  Laterality: N/A;  T12 Kyphoplasty  . WISDOM TOOTH EXTRACTION     x4  . WRIST SURGERY Right 07-23-2013    Current Outpatient Medications  Medication Sig Dispense Refill  . buPROPion (WELLBUTRIN XL) 150 MG 24 hr tablet Take 150 mg by mouth daily.    . calcium carbonate (TUMS EX) 750 MG chewable tablet Chew 1-2 tablets by mouth as needed (for heartburn or indigestion).    . Eluxadoline (VIBERZI) 75 MG TABS Take 75 mg by mouth daily as needed (for symptoms- diarrhea).     . hydrocortisone (ANUSOL-HC) 2.5 % rectal cream Place 1 application rectally 2 (two) times  daily. Use for 14 days (Patient taking differently: Place 1 application rectally as needed for hemorrhoids or anal itching. ) 30 g 1  . ibuprofen (ADVIL,MOTRIN) 200 MG tablet Take 800 mg by mouth every 6 (six) hours as needed for mild pain or moderate pain.    Marland Kitchen loperamide (IMODIUM) 2 MG capsule Take 2 mg by mouth as needed for diarrhea or loose stools.     . Multiple Vitamin (MULTIVITAMIN) tablet Take 1 tablet by mouth daily.    . ondansetron (ZOFRAN) 4 MG tablet Take every 4-6 hours as needed. (Patient not taking: Reported on 02/12/2019) 30 tablet 1  . pantoprazole (PROTONIX) 40 MG tablet TAKE 1 TABLET BY MOUTH DAILY. TAKE 30-60 MINUTES BEFORE BREAKFAST 90 tablet 0  . polyethylene glycol (MIRALAX / GLYCOLAX) packet Take 17 g by mouth daily as needed for mild constipation.    . pregabalin (LYRICA) 300 MG capsule Take 300 mg by mouth 2 (two) times daily.    . Prenatal Vit-Fe Fumarate-FA (PRENATAL VITAMIN) 27-0.8 MG TABS Take 1 tablet by mouth daily. 90 tablet 6  . SPRINTEC 28 0.25-35 MG-MCG tablet Take 1 tablet by mouth daily.    Marland Kitchen thiamine (VITAMIN B-1) 100 MG tablet Take 1 tablet (100 mg total) by mouth daily. (Patient not taking: Reported on 02/12/2019) 30 tablet 0  . traMADol (ULTRAM) 50 MG tablet Take 50 mg by mouth every 4 (four) hours as needed.     No current facility-administered medications for this visit.     Allergies as of 02/12/2019 - Review Complete 02/12/2019  Allergen Reaction Noted  . Tape Itching and Other (See Comments) 12/06/2018    Vitals: There were no vitals taken for this visit. Last Weight:  Wt Readings from Last 1 Encounters:  12/14/18 150 lb (68 kg)  Last Height:   Ht Readings from Last 1 Encounters:  12/14/18 5' 7.5" (1.715 m)    Physical exam: Exam: Gen: NAD, conversant                     CV: RRR, no MRG. No Carotid Bruits. No peripheral edema, warm, nontender Eyes: Conjunctivae clear without exudates or hemorrhage  Neuro: Detailed Neurologic Exam   Speech:    Speech is normal; fluent and spontaneous with normal comprehension.  Cognition:    The patient is oriented to person, place, and time;     recent and remote memory intact;     language fluent;     normal attention, concentration,     fund of knowledge Cranial Nerves:    The pupils are equal, round, and reactive to light. The fundi are normal and spontaneous venous pulsations are present. Visual fields are full to finger confrontation. Extraocular movements are intact. Trigeminal sensation is intact and the muscles of mastication are normal. The face is symmetric. The palate elevates in the midline. Hearing intact. Voice is normal. Shoulder shrug is normal. The tongue has normal motion without fasciculations.   Coordination:    Dysmetria left arm with FTN   Gait:    Ataxic but improved since last being seen  Motor Observation:    No asymmetry, no atrophy, and no involuntary movements noted. Tone:    Normal muscle tone.    Posture:    Posture is normal. normal erect    Strength:    Mild lower extremity weakness more proximally 4/5 hip flexion and 5- leg flexion and triceps otherwise appears intact strength and symmetric     Sensation: sensory level approx T5     Reflex Exam:  DTR's:    Deep tendon reflexes hyporeflexic in the and lower extremities and brisk at biceps bilaterally.   Toes:    The toes are downgoing bilaterally.   Clonus:    Clonus is absent.      Assessment/Plan:  Patient previously with ascending numbness and weakness, states she cannot walk or feel her chest from the breasts down, sensory level around T4-T5, ataxic on exam, hyporeflexic in the lowers but brisk uppers. Sent to the ED and diagnosed with GBS, treated with IVIG improving.  New problem: Diplopia, opthalmoplegia, dysarthria: May be due to Wernicke Sydfrome, she had B1 deficiency. Unlikely due to GBS.   - Vit B labs and other labs - Mri brain w/wo contrast to look for brainstem  strokes and findings c/w wernicke's syndrome (one study showed 85% of the patients showed symmetric lesions in the medial thalami and the periventricular region of the third ventricle, 65% in the periaqueductal area, 58% in the mamillary bodies, 38% in the tectal plate, and 8% in the dorsal medulla. Contrast enhancement of the mamillary bodies was statistically positively correlated with the alcohol abuse group.), also demyelination is a possibility. - [facility] ophthal[facility] for problems in the globes that could cause the diplopia - Prenatal vitamin daily. She was on 147mcg B1 but she stopped due to elevated B1, advised to continue a daily multivitamin for life. - She denied any alcohol use or polysubstance abuse but unclear if this true    - prior substance abuse, I am very concerned about her opioid usage. I refilled for one week given post-GBS can be very painful but she needs to follow up with pcp or pain management for extremely close monitoring and titrating off in less than 4  weeks. Can be managed with other medications such as Gabapentin and Lyrica. Discussed with patient and husband. - Continue PT - Need to monitor for CIDP but given improvement I do not think this will occur.  Follow Up Instructions:    I discussed the assessment and treatment plan with the patient. The patient was provided an opportunity to ask questions and all were answered. The patient agreed with the plan and demonstrated an understanding of the instructions.   The patient was advised to call back or seek an in-person evaluation if the symptoms worsen or if the condition fails to improve as anticipated.  Melvenia Beam, MD  A total of 40 minutes was spent video face-to-face(not in person)  with this patient. Over half this time was spent on counseling patient on the  1. Diplopia   2. Dysarthria   3. Ataxia   4. B12 deficiency   5. Thiamine deficiency with Wernicke-Korsakoff syndrome in adult ([place])   6.  Polysubstance abuse ([place])   7. Alcoholism ([place])   8. Malnutrition, unspecified type ([place])   9. Vitamin B1 deficiency   10. Vitamin B6 deficiency    diagnosis and different diagnostic and therapeutic options, counseling and coordination of care, risks ans benefits of management, compliance, or risk factor reduction and education.    Orders Placed This Encounter  Procedures  . MR BRAIN W WO CONTRAST  . Vitamin B1  . B12 and Folate Panel  . Methylmalonic acid, serum  . Vitamin B6  . Vitamin D, 25-hydroxy    cc: Dr. Dolphus Jenny, MD  [facility] [address] [place], [place] [address]  Phone [phone] Fax [phone]

## 2019-02-13 ENCOUNTER — Other Ambulatory Visit: Payer: Self-pay

## 2019-02-13 ENCOUNTER — Other Ambulatory Visit (INDEPENDENT_AMBULATORY_CARE_PROVIDER_SITE_OTHER): Payer: Self-pay

## 2019-02-13 DIAGNOSIS — Z0289 Encounter for other administrative examinations: Secondary | ICD-10-CM

## 2019-02-13 DIAGNOSIS — E531 Pyridoxine deficiency: Secondary | ICD-10-CM

## 2019-02-13 DIAGNOSIS — E538 Deficiency of other specified B group vitamins: Secondary | ICD-10-CM

## 2019-02-13 DIAGNOSIS — E519 Thiamine deficiency, unspecified: Secondary | ICD-10-CM

## 2019-02-13 DIAGNOSIS — R471 Dysarthria and anarthria: Secondary | ICD-10-CM

## 2019-02-13 DIAGNOSIS — R270 Ataxia, unspecified: Secondary | ICD-10-CM

## 2019-02-13 DIAGNOSIS — F1910 Other psychoactive substance abuse, uncomplicated: Secondary | ICD-10-CM

## 2019-02-13 DIAGNOSIS — F1020 Alcohol dependence, uncomplicated: Secondary | ICD-10-CM

## 2019-02-13 DIAGNOSIS — H532 Diplopia: Secondary | ICD-10-CM

## 2019-02-13 DIAGNOSIS — E46 Unspecified protein-calorie malnutrition: Secondary | ICD-10-CM

## 2019-02-13 DIAGNOSIS — F04 Amnestic disorder due to known physiological condition: Secondary | ICD-10-CM

## 2019-02-14 ENCOUNTER — Telehealth: Payer: Self-pay | Admitting: Neurology

## 2019-02-14 NOTE — Telephone Encounter (Signed)
[employer]BS Auth: [id number] (exp. 02/14/19 to 08/12/19) order sent to GI. They will reach out tot he pt to schedule.

## 2019-02-15 LAB — VITAMIN B1: Thiamine: 131.9 nmol/L (ref 66.5–200.0)

## 2019-02-15 LAB — METHYLMALONIC ACID, SERUM: Methylmalonic Acid: 120 nmol/L (ref 0–378)

## 2019-02-15 LAB — VITAMIN D 25 HYDROXY (VIT D DEFICIENCY, FRACTURES): Vit D, 25-Hydroxy: 20.1 ng/mL — ABNORMAL LOW (ref 30.0–100.0)

## 2019-02-15 LAB — VITAMIN B6: Vitamin B6: 9.7 ug/L (ref 2.0–32.8)

## 2019-02-15 LAB — B12 AND FOLATE PANEL
Folate: 3.9 ng/mL (ref >3.0)
Vitamin B-12: 205 pg/mL — ABNORMAL LOW (ref 232–1245)

## 2019-02-19 ENCOUNTER — Telehealth: Payer: Self-pay | Admitting: *Deleted

## 2019-02-19 NOTE — Telephone Encounter (Signed)
-----   Message from Melvenia Beam, MD sent at 02/18/2019  4:38 PM EDT ----- Patient's vitamin D is very low. She is also B12 deficient. Her Thiamine is normal but I would get an OTC multi-B vitamin and take it daily(ensure it has at least 1022mcg of B12 in it and thiamine in it too). If her thiamine was low in the past it may become low again, and same for the B12. Also take daily Vitamin D 1000-2000IU daily. She needs to discuss with her pcp why she is so deficient in these vitamins, is it her diet? She has a history of polysubstance abuse it may be alcohol or illicit drug use but please emphasize these deficiencies can cause significant problems and she should be retested in 2-3 months after being on daily supplements.

## 2019-02-19 NOTE — Telephone Encounter (Signed)
Yes, thank you I just placed referral.

## 2019-02-19 NOTE — Telephone Encounter (Signed)
Called pt on mobile & LVM (ok per DPR) informing pt that referral was placed today to Dr. [name]. Advised pt that with pandemic, unsure exactly on turnaround time but encouraged pt give them a little time to process referral and call. Provided their office number for [place] [address] [place] location if she does not hear from them. Asked for call back with any questions.

## 2019-02-19 NOTE — Telephone Encounter (Signed)
Spoke with patient and discussed her lab results from Dr. Jaynee Eagles. Pt verbalized understanding and took notes on vitamins to start taking daily, Vitamin D 1000-2000 iu and a multi-B vitamin (with thiamine and at least 1000 mcg of B12). Pt also understood that she should follow-up with PCP to discuss the deficiencies and recheck in 2-3 months after starting the daily supplements. Discussed that the deficiences can cause significant problems and we reviewed possible symptoms of B12 deficiency. Lab results routed to PCP Dr. [name], pt aware and appreciative. She also stated that she had not heard from the eye doctor yet.

## 2019-02-19 NOTE — Addendum Note (Signed)
Addended by: Sarina Ill B on: 02/19/2019 03:05 PM   Modules accepted: Orders

## 2019-02-20 ENCOUNTER — Other Ambulatory Visit: Payer: Self-pay

## 2019-02-20 ENCOUNTER — Ambulatory Visit
Admission: RE | Admit: 2019-02-20 | Discharge: 2019-02-20 | Disposition: A | Discharge status: Home / Self Care | Payer: BLUE CROSS/BLUE SHIELD | Source: Ambulatory Visit | Attending: Neurology | Admitting: Neurology

## 2019-02-20 DIAGNOSIS — E538 Deficiency of other specified B group vitamins: Secondary | ICD-10-CM

## 2019-02-20 DIAGNOSIS — F04 Amnestic disorder due to known physiological condition: Secondary | ICD-10-CM

## 2019-02-20 DIAGNOSIS — R270 Ataxia, unspecified: Secondary | ICD-10-CM | POA: Diagnosis not present

## 2019-02-20 DIAGNOSIS — H532 Diplopia: Secondary | ICD-10-CM

## 2019-02-20 DIAGNOSIS — R471 Dysarthria and anarthria: Secondary | ICD-10-CM | POA: Diagnosis not present

## 2019-02-20 MED ORDER — GADOBENATE DIMEGLUMINE 529 MG/ML IV SOLN
14.0000 mL | Freq: Once | INTRAVENOUS | Status: AC | PRN
  Administered 2019-02-20: 14 mL via INTRAVENOUS

## 2019-02-21 NOTE — Telephone Encounter (Signed)
Pt states she cant find the b12 not the d vitamins and wants to know if a script can be written for them

## 2019-02-24 ENCOUNTER — Other Ambulatory Visit: Payer: Self-pay | Admitting: Neurology

## 2019-02-24 MED ORDER — L-METHYLFOLATE-B6-B12 3-35-2 MG PO TABS: 1.0000 | ORAL_TABLET | Freq: Two times a day (BID) | ORAL | 6 refills | Status: DC

## 2019-02-24 MED ORDER — VITAMIN D (ERGOCALCIFEROL) 1.25 MG (50000 UNIT) PO CAPS: 50000.0000 [IU] | ORAL_CAPSULE | ORAL | 1 refills | Status: DC

## 2019-02-24 NOTE — Telephone Encounter (Signed)
I sent in metanx and VitD. She will have to pay out of pocket. If it is too expensive she can ask the pharmacist to find her someothing OTC similar to the Metanx and ask the pharmacist to find her an OTC vitamin D daily tablet daily

## 2019-02-25 ENCOUNTER — Telehealth: Payer: Self-pay | Admitting: *Deleted

## 2019-02-25 NOTE — Telephone Encounter (Signed)
Spoke with pt and discussed Dr. [name] message. Pt verbalized appreciation. She stated she received text alert from [facility]. One prescription is free d/t meeting deductible. One is $82. She will speak with pharmacist about finding an OTC equivalent.

## 2019-02-25 NOTE — Telephone Encounter (Signed)
-----   Message from Melvenia Beam, MD sent at 02/24/2019 12:14 PM EDT ----- MRI brain unremarkable thanks

## 2019-02-25 NOTE — Telephone Encounter (Signed)
Spoke with pt. She understands MRI brain is unremarkable. She verbalized appreciation for the call.

## 2019-03-07 DIAGNOSIS — M25531 Pain in right wrist: Secondary | ICD-10-CM | POA: Diagnosis not present

## 2019-03-12 ENCOUNTER — Other Ambulatory Visit: Payer: Self-pay | Admitting: Physician Assistant

## 2019-03-14 DIAGNOSIS — M25462 Effusion, left knee: Secondary | ICD-10-CM | POA: Diagnosis not present

## 2019-03-14 DIAGNOSIS — S8392XA Sprain of unspecified site of left knee, initial encounter: Secondary | ICD-10-CM | POA: Diagnosis not present

## 2019-03-14 DIAGNOSIS — M25562 Pain in left knee: Secondary | ICD-10-CM | POA: Diagnosis not present

## 2019-03-21 ENCOUNTER — Telehealth: Payer: Self-pay | Admitting: *Deleted

## 2019-03-21 ENCOUNTER — Encounter: Payer: Self-pay | Admitting: *Deleted

## 2019-03-21 NOTE — Telephone Encounter (Signed)
Spoke with pt about her upcoming appt Mon 5/18. She stated she is about the same. Her eyes are better and she has not had any complaints about her speech. She stated she has some days where she can feel parts of her legs and other days not. She gave consent to do a virtual visit on [email or web address] and is aware this appointment will be filed with her insurance. Confirmed pt's name & dob and updated her chart including meds, PMH, social history. Pt understands she will receive a call 30 minutes prior to appt for check-in. She will need to enter her first and last name to join the virtual waiting room but no registration or payment is required for the site. She verbalized appreciation for the call. Her cell is [phone] and her carrier is [employer]. Pt understands she will receive a text with link to join meeting; join 5-10 minutes prior to start time.   [email or web address] link sent to [email or web address]

## 2019-03-25 ENCOUNTER — Other Ambulatory Visit: Payer: Self-pay

## 2019-03-25 ENCOUNTER — Ambulatory Visit (INDEPENDENT_AMBULATORY_CARE_PROVIDER_SITE_OTHER): Payer: BLUE CROSS/BLUE SHIELD | Admitting: Neurology

## 2019-03-25 DIAGNOSIS — G610 Guillain-Barre syndrome: Secondary | ICD-10-CM | POA: Diagnosis not present

## 2019-03-25 MED ORDER — PREGABALIN 200 MG PO CAPS: 200.0000 mg | ORAL_CAPSULE | Freq: Three times a day (TID) | ORAL | 1 refills | Status: DC

## 2019-03-25 NOTE — Progress Notes (Signed)
[facility]    Provider:  Dr Jaynee Eagles Referring Provider: [name], MD Primary Care Physician:  [name], MD  CC:  F/u GBS  Virtual Visit via Video Note  I connected with Kimberly [name] on 03/26/19 at  3:00 PM EDT by a video enabled telemedicine application and verified that I am speaking with the correct person using two identifiers. Patient at home, physician in the office.   I discussed the limitations of evaluation and management by telemedicine and the availability of in person appointments. The patient expressed understanding and agreed to proceed.  03/25/2019: She can scratch her calf and feel her toe, strange sensory perceptions. Some days she has some feeling and then it goes away. Discussed this could be the nerves regenerating. She still has some nerve pain but improving, but she has pain in the arches of her feet and keeps cotton balls between her toes because it hurts for them to rub together. lyrica helps, will split it tid for better coverage. She feels great, she was hiking in the woods and swimming with her kids, she had a knee injury and the sensory changes bother her but feels doing well. Will change lyrice to 200mg  tid for better coverage. F/u 3 months  Interval history 02/12/2019: Patient with a prior history of opioid abuse, GBS recently this year admitted for IVIG then discharged, admitted again for possible thiamine deficiency.  She is feeling better. Overall she is improving. She is still numb from below her breast, her legs are numb and lower legs and feet and nerve pain.She can't feel her toes and fingers. She graduated from PT. She has 35 year old twin boys. She has started riding a bike.  She has been losing hair. This is not my specialty, I'm not aware Lyrica can do this but unsure, may be stress, advised to discuss with pcp because this is not my area of expertise. She has bald spots, recommended pcp and possibly derm. She has double vision,  started in the hospital the first time but worsening. She will see objects side by side, worse up closer, closing left eye makes it go away. Her family thinks she is drunk a lot due to her walking and she sounds and looks intoxicated. These symptoms have been stable for 4 weeks.She has not been taking her B1 supplement because it was too high and then it was normal. Speech Therapy.   Interval History: She is doing well. She was discharged from the hospital about 6 days ago. She was readmitted for concerns of thiamin deficiency. She is slowly regaining feeling. She can feel in bilateral feet and hands. She can feel pressure most everywhere else with exception of her abdomen. She continues thiamin, gabapentin and Lyrica. She does continue to have numbness, tingling and shooting pains. She is working with PT. She has finished therapy with OT. She is requesting a handicap placard and refill of her oxycodone-acetaminophen. She continues this q6hr for pain.    HPI:  Kimberly [name] is a 33 y.o. female here as requested by Dr. Hilma Favors for numbness. PMHx depression. Swelling in the feet and legs for several months, placed on diuretic around the end of the year, she became dehydrated. The pain started when the swelling reduced. She was significantly swollen, she "didn't have ankles", then they went numb in the area of the swelling. But then it started moving up the legs 5 days ago and now is up to her chest and her left  shoulder and arm is tingly but she is numb. Now her mouth is numb. She has "serious weakness", she can barely walk or hold onto herself a lot of times. When she is walking she feels like her legs are going to come out from beneath her. She has not fallen. Still progressing. She is tight when she lays down flat but she cant feel anything from the neck down.   Reviewed notes, labs and imaging from outside physicians, which showed:  Patient presented on 11/06/2018 with bilateral leg pain and swelling  of the ankles and feet.  Reviewed Dr. Delanna Ahmadi notes.  She was put on Lasix and had hypokalemia.  She was prescribed potassium.  Went to ED and diagnosed with dehydration.  Urine culture positive for E. coli but patient denied treatment was told UA in hospital normal.  Labs also showed elevated MCV of 106.  Prior history of alcoholism treated 2 years ago and denies current use.  Believes she was tested for B12 and folate no lab results in chart TSH was normal in July 2019.  Still complaining of nausea and vomiting.TSH 0.78. B12, folate was drawn no results yet.     Review of Systems: Patient complains of symptoms per HPI as well as the following symptoms: numbness, weakness, restless legs. Pertinent negatives and positives per HPI. All others negative.   Social History   Socioeconomic History  . Marital status: Married    Spouse name: Not on file  . Number of children: 2  . Years of education: Not on file  . Highest education level: Not on file  Occupational History  . Occupation: In home health   Social Needs  . Financial resource strain: Not on file  . Food insecurity:    Worry: Not on file    Inability: Not on file  . Transportation needs:    Medical: Not on file    Non-medical: Not on file  Tobacco Use  . Smoking status: Current Every Day Smoker    Packs/day: 0.50    Years: 10.00    Pack years: 5.00    Types: Cigarettes    Last attempt to quit: 01/31/2012    Years since quitting: 7.1  . Smokeless tobacco: Never Used  . Tobacco comment: pt has decreased her use to 1-2 cigarettes per day   Substance and Sexual Activity  . Alcohol use: Yes    Comment: occasionally  . Drug use: No  . Sexual activity: Yes    Birth control/protection: Pill    Comment: 1st intercourse 33 yo-More than 5 partners  Lifestyle  . Physical activity:    Days per week: Not on file    Minutes per session: Not on file  . Stress: Not on file  Relationships  . Social connections:    Talks on phone:  Not on file    Gets together: Not on file    Attends religious service: Not on file    Active member of club or organization: Not on file    Attends meetings of clubs or organizations: Not on file    Relationship status: Not on file  . Intimate partner violence:    Fear of current or ex partner: Not on file    Emotionally abused: Not on file    Physically abused: Not on file    Forced sexual activity: Not on file  Other Topics Concern  . Not on file  Social History Narrative   Lives with husband and twin sons  Family History  Problem Relation Age of Onset  . Depression Mother        chronic  . Hearing loss Mother   . Hyperlipidemia Mother   . Vision loss Mother        beachets disease  . COPD Father   . Diabetes Father   . Cancer Maternal Grandmother 62       breast cancer  . Parkinsonism Maternal Grandmother   . Heart disease Maternal Grandmother   . Breast cancer Maternal Grandmother   . Alzheimer's disease Paternal Grandmother   . Heart disease Paternal Grandfather        Died age 35  . Nephrolithiasis Paternal Grandfather   . Heart disease Maternal Grandfather   . Cancer Other 60       breast  . Breast cancer Other     Past Medical History:  Diagnosis Date  . Abnormal Pap smear   . ADD (attention deficit disorder)   . Anxiety   . Family history of anesthesia complication    mother has difficult  time going to sleep  . Guillain Barr syndrome ([place])   . Headache(784.0)   . HSV-1 infection   . IBS (irritable bowel syndrome)   . Knee hyperextension injury 2020   Left, being treated by ortho  . Tachycardia     Past Surgical History:  Procedure Laterality Date  . BREAST BIOPSY Right   . BREAST SURGERY     Biopsy-benign  . CESAREAN SECTION  09/13/2012   Procedure: CESAREAN SECTION;  Surgeon: Lavonia Drafts, MD;  Location: [facility];  Service: Obstetrics;  Laterality: N/A;  . cortisone shot R wrist Right 2020  . KYPHOPLASTY N/A 08/27/2013    Procedure: Thoracic Twelve Kyphoplasty;  Surgeon: Erline Levine, MD;  Location: [facility];  Service: Neurosurgery;  Laterality: N/A;  T12 Kyphoplasty  . WISDOM TOOTH EXTRACTION     x4  . WRIST SURGERY Right 07-23-2013    Current Outpatient Medications  Medication Sig Dispense Refill  . buPROPion (WELLBUTRIN XL) 150 MG 24 hr tablet Take 150 mg by mouth daily.    . calcium carbonate (TUMS EX) 750 MG chewable tablet Chew 1-2 tablets by mouth as needed (for heartburn or indigestion).    . Eluxadoline (VIBERZI) 75 MG TABS Take 75 mg by mouth daily as needed (for symptoms- diarrhea).     . hydrocortisone (ANUSOL-HC) 2.5 % rectal cream Place 1 application rectally 2 (two) times daily. Use for 14 days (Patient not taking: Reported on 03/21/2019) 30 g 1  . ibuprofen (ADVIL,MOTRIN) 200 MG tablet Take 800 mg by mouth every 6 (six) hours as needed for mild pain or moderate pain.    Marland Kitchen l-methylfolate-B6-B12 (METANX) 3-35-2 MG TABS tablet Take 1 tablet by mouth 2 (two) times daily. 60 tablet 6  . loperamide (IMODIUM) 2 MG capsule Take 2 mg by mouth as needed for diarrhea or loose stools.     . Multiple Vitamin (MULTIVITAMIN) tablet Take 1 tablet by mouth daily.    . ondansetron (ZOFRAN) 4 MG tablet Take every 4-6 hours as needed. 30 tablet 1  . pantoprazole (PROTONIX) 40 MG tablet TAKE 1 TABLET BY MOUTH DAILY. TAKE 30-60 MINUTES BEFORE BREAKFAST 90 tablet 0  . polyethylene glycol (MIRALAX / GLYCOLAX) packet Take 17 g by mouth daily as needed for mild constipation.    . pregabalin (LYRICA) 200 MG capsule Take 1 capsule (200 mg total) by mouth 3 (three) times daily. 270 capsule 1  . Prenatal  Vit-Fe Fumarate-FA (PRENATAL VITAMIN) 27-0.8 MG TABS Take 1 tablet by mouth daily. 90 tablet 6  . SPRINTEC 28 0.25-35 MG-MCG tablet Take 1 tablet by mouth daily.    Marland Kitchen thiamine (VITAMIN B-1) 100 MG tablet Take 1 tablet (100 mg total) by mouth daily. (Patient not taking: Reported on 02/12/2019) 30 tablet 0  . traMADol (ULTRAM)  50 MG tablet Take 50 mg by mouth every 4 (four) hours as needed.    . Vitamin D, Ergocalciferol, (DRISDOL) 1.25 MG (50000 UT) CAPS capsule Take 1 capsule (50,000 Units total) by mouth every 7 (seven) days. 5 capsule 1   No current facility-administered medications for this visit.     Allergies as of 03/25/2019 - Review Complete 03/21/2019  Allergen Reaction Noted  . Tape Itching and Other (See Comments) 12/06/2018    Vitals: There were no vitals taken for this visit. Last Weight:  Wt Readings from Last 1 Encounters:  03/21/19 155 lb (70.3 kg)   Last Height:   Ht Readings from Last 1 Encounters:  03/21/19 5' 7.5" (1.715 m)   PRIOR EXA<  Physical exam: Exam: Gen: NAD, conversant                     CV: RRR, no MRG. No Carotid Bruits. No peripheral edema, warm, nontender Eyes: Conjunctivae clear without exudates or hemorrhage  Neuro: Detailed Neurologic Exam  Speech:    Speech is normal; fluent and spontaneous with normal comprehension.  Cognition:    The patient is oriented to person, place, and time;     recent and remote memory intact;     language fluent;     normal attention, concentration,     fund of knowledge Cranial Nerves:    The pupils are equal, round, and reactive to light. The fundi are normal and spontaneous venous pulsations are present. Visual fields are full to finger confrontation. Extraocular movements are intact. Trigeminal sensation is intact and the muscles of mastication are normal. The face is symmetric. The palate elevates in the midline. Hearing intact. Voice is normal. Shoulder shrug is normal. The tongue has normal motion without fasciculations.   Coordination:    Dysmetria left arm with FTN   Gait:    Ataxic but improved since last being seen  Motor Observation:    No asymmetry, no atrophy, and no involuntary movements noted. Tone:    Normal muscle tone.    Posture:    Posture is normal. normal erect    Strength:    Mild lower  extremity weakness more proximally 4/5 hip flexion and 5- leg flexion and triceps otherwise appears intact strength and symmetric     Sensation: sensory level approx T5     Reflex Exam:  DTR's:    Deep tendon reflexes hyporeflexic in the and lower extremities and brisk at biceps bilaterally.   Toes:    The toes are downgoing bilaterally.   Clonus:    Clonus is absent.      Assessment/Plan:  Patient previously with ascending numbness and weakness, states she cannot walk or feel her chest from the breasts down, sensory level around T4-T5, ataxic on exam, hyporeflexic in the lowers but brisk uppers. Sent to the ED and diagnosed with GBS, treated with IVIG improving.   - Vit B deficiency: continue supplementation - Mri brain w/wo contrast neg. Diplopia resolved.  [facility] ophthalmology for problems in the globes that could cause the diplopia, referred, follow with him - Prenatal vitamin  daily. She was on 134mcg B1 but she stopped due to elevated B1, advised to continue a daily multivitamin for life. - She denied any alcohol use or polysubstance abuse but unclear if this true  - prior substance abuse, avoid narcotics - Continue PT - Need to monitor for CIDP but given improvement I do not think this will occur.  Follow Up Instructions:    I discussed the assessment and treatment plan with the patient. The patient was provided an opportunity to ask questions and all were answered. The patient agreed with the plan and demonstrated an understanding of the instructions.   The patient was advised to call back or seek an in-person evaluation if the symptoms worsen or if the condition fails to improve as anticipated.  Melvenia Beam, MD  A total of 40 minutes was spent video face-to-face(not in person)  with this patient. Over half this time was spent on counseling patient on the  No diagnosis found. diagnosis and different diagnostic and therapeutic options, counseling and  coordination of care, risks ans benefits of management, compliance, or risk factor reduction and education.    No orders of the defined types were placed in this encounter.   cc: Dr. Dolphus Jenny, MD  [facility] [address] [place], [place] [address]  Phone [phone] Fax [phone]

## 2019-03-26 NOTE — Telephone Encounter (Signed)
6 months with Amy thanks

## 2019-03-26 NOTE — Telephone Encounter (Signed)
Does patient need to f/u in any specific amount of time or just as needed?

## 2019-03-27 ENCOUNTER — Other Ambulatory Visit: Payer: Self-pay | Admitting: Neurology

## 2019-03-27 NOTE — Telephone Encounter (Signed)
Spoke with pt [phone]) and scheduled her for a 6 month f/u with Amy on Wed 09/25/2019 @ 11:30 AM arrival 15-30 minutes prior. She verbalized appreciation for the call.

## 2019-03-28 DIAGNOSIS — S8392XA Sprain of unspecified site of left knee, initial encounter: Secondary | ICD-10-CM | POA: Diagnosis not present

## 2019-04-04 DIAGNOSIS — D2262 Melanocytic nevi of left upper limb, including shoulder: Secondary | ICD-10-CM | POA: Diagnosis not present

## 2019-04-04 DIAGNOSIS — L720 Epidermal cyst: Secondary | ICD-10-CM | POA: Diagnosis not present

## 2019-04-04 DIAGNOSIS — D225 Melanocytic nevi of trunk: Secondary | ICD-10-CM | POA: Diagnosis not present

## 2019-04-04 DIAGNOSIS — D224 Melanocytic nevi of scalp and neck: Secondary | ICD-10-CM | POA: Diagnosis not present

## 2019-04-04 DIAGNOSIS — M654 Radial styloid tenosynovitis [de Quervain]: Secondary | ICD-10-CM | POA: Diagnosis not present

## 2019-04-04 DIAGNOSIS — D2261 Melanocytic nevi of right upper limb, including shoulder: Secondary | ICD-10-CM | POA: Diagnosis not present

## 2019-04-04 DIAGNOSIS — D485 Neoplasm of uncertain behavior of skin: Secondary | ICD-10-CM | POA: Diagnosis not present

## 2019-04-04 DIAGNOSIS — D1801 Hemangioma of skin and subcutaneous tissue: Secondary | ICD-10-CM | POA: Diagnosis not present

## 2019-04-11 ENCOUNTER — Telehealth: Payer: Self-pay | Admitting: Neurology

## 2019-05-02 DIAGNOSIS — M25472 Effusion, left ankle: Secondary | ICD-10-CM | POA: Diagnosis not present

## 2019-05-02 DIAGNOSIS — M25572 Pain in left ankle and joints of left foot: Secondary | ICD-10-CM | POA: Diagnosis not present

## 2019-05-16 DIAGNOSIS — M25572 Pain in left ankle and joints of left foot: Secondary | ICD-10-CM | POA: Diagnosis not present

## 2019-05-20 ENCOUNTER — Other Ambulatory Visit: Payer: Self-pay | Admitting: Physician Assistant

## 2019-06-16 ENCOUNTER — Other Ambulatory Visit: Payer: Self-pay | Admitting: Neurology

## 2019-06-27 ENCOUNTER — Other Ambulatory Visit: Payer: Self-pay

## 2019-06-27 DIAGNOSIS — Z20822 Contact with and (suspected) exposure to covid-19: Secondary | ICD-10-CM

## 2019-06-28 LAB — NOVEL CORONAVIRUS, NAA: SARS-CoV-2, NAA: NOT DETECTED

## 2019-07-02 ENCOUNTER — Other Ambulatory Visit: Payer: Self-pay

## 2019-07-03 ENCOUNTER — Encounter: Payer: Self-pay | Admitting: Women's Health

## 2019-07-03 ENCOUNTER — Ambulatory Visit: Payer: BC Managed Care – PPO | Admitting: Women's Health

## 2019-07-03 ENCOUNTER — Other Ambulatory Visit: Payer: Self-pay

## 2019-07-03 VITALS — BP 124/80 | Ht 67.0 in | Wt 176.0 lb

## 2019-07-03 DIAGNOSIS — Z7721 Contact with and (suspected) exposure to potentially hazardous body fluids: Secondary | ICD-10-CM | POA: Diagnosis not present

## 2019-07-03 DIAGNOSIS — Z01419 Encounter for gynecological examination (general) (routine) without abnormal findings: Secondary | ICD-10-CM | POA: Diagnosis not present

## 2019-07-03 MED ORDER — SPRINTEC 28 0.25-35 MG-MCG PO TABS: 1.0000 | ORAL_TABLET | Freq: Every day | ORAL | 4 refills | Status: DC

## 2019-07-03 MED ORDER — NORETHINDRONE 0.35 MG PO TABS: 1.0000 | ORAL_TABLET | Freq: Every day | ORAL | 4 refills | Status: DC
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
## 2019-07-03 NOTE — Patient Instructions (Addendum)
Counselor   Health Maintenance, Female Adopting a healthy lifestyle and getting preventive care are important in promoting health and wellness. Ask your health care provider about:  The right schedule for you to have regular tests and exams.  Things you can do on your own to prevent diseases and keep yourself healthy. What should I know about diet, weight, and exercise? Eat a healthy diet   Eat a diet that includes plenty of vegetables, fruits, low-fat dairy products, and lean protein.  Do not eat a lot of foods that are high in solid fats, added sugars, or sodium. Maintain a healthy weight Body mass index (BMI) is used to identify weight problems. It estimates body fat based on height and weight. Your health care provider can help determine your BMI and help you achieve or maintain a healthy weight. Get regular exercise Get regular exercise. This is one of the most important things you can do for your health. Most adults should:  Exercise for at least 150 minutes each week. The exercise should increase your heart rate and make you sweat (moderate-intensity exercise).  Do strengthening exercises at least twice a week. This is in addition to the moderate-intensity exercise.  Spend less time sitting. Even light physical activity can be beneficial. Watch cholesterol and blood lipids Have your blood tested for lipids and cholesterol at 35 years of age, then have this test every 5 years. Have your cholesterol levels checked more often if:  Your lipid or cholesterol levels are high.  You are older than 35 years of age.  You are at high risk for heart disease. What should I know about cancer screening? Depending on your health history and family history, you may need to have cancer screening at various ages. This may include screening for:  Breast cancer.  Cervical cancer.  Colorectal cancer.  Skin cancer.  Lung cancer. What should I know about heart disease, diabetes, and high  blood pressure? Blood pressure and heart disease  High blood pressure causes heart disease and increases the risk of stroke. This is more likely to develop in people who have high blood pressure readings, are of African descent, or are overweight.  Have your blood pressure checked: ? Every 3-5 years if you are 44-52 years of age. ? Every year if you are 42 years old or older. Diabetes Have regular diabetes screenings. This checks your fasting blood sugar level. Have the screening done:  Once every three years after age 34 if you are at a normal weight and have a low risk for diabetes.  More often and at a younger age if you are overweight or have a high risk for diabetes. What should I know about preventing infection? Hepatitis B If you have a higher risk for hepatitis B, you should be screened for this virus. Talk with your health care provider to find out if you are at risk for hepatitis B infection. Hepatitis C Testing is recommended for:  Everyone born from [birth year] through 1967.  Anyone with known risk factors for hepatitis C. Sexually transmitted infections (STIs)  Get screened for STIs, including gonorrhea and chlamydia, if: ? You are sexually active and are younger than 35 years of age. ? You are older than 35 years of age and your health care provider tells you that you are at risk for this type of infection. ? Your sexual activity has changed since you were last screened, and you are at increased risk for chlamydia or gonorrhea. Ask your health  care provider if you are at risk.  Ask your health care provider about whether you are at high risk for HIV. Your health care provider may recommend a prescription medicine to help prevent HIV infection. If you choose to take medicine to prevent HIV, you should first get tested for HIV. You should then be tested every 3 months for as long as you are taking the medicine. Pregnancy  If you are about to stop having your period  (premenopausal) and you may become pregnant, seek counseling before you get pregnant.  Take 400 to 800 micrograms (mcg) of folic acid every day if you become pregnant.  Ask for birth control (contraception) if you want to prevent pregnancy. Osteoporosis and menopause Osteoporosis is a disease in which the bones lose minerals and strength with aging. This can result in bone fractures. If you are 34 years old or older, or if you are at risk for osteoporosis and fractures, ask your health care provider if you should:  Be screened for bone loss.  Take a calcium or vitamin D supplement to lower your risk of fractures.  Be given hormone replacement therapy (HRT) to treat symptoms of menopause. Follow these instructions at home: Lifestyle  Do not use any products that contain nicotine or tobacco, such as cigarettes, e-cigarettes, and chewing tobacco. If you need help quitting, ask your health care provider.  Do not use street drugs.  Do not share needles.  Ask your health care provider for help if you need support or information about quitting drugs. Alcohol use  Do not drink alcohol if: ? Your health care provider tells you not to drink. ? You are pregnant, may be pregnant, or are planning to become pregnant.  If you drink alcohol: ? Limit how much you use to 0-1 drink a day. ? Limit intake if you are breastfeeding.  Be aware of how much alcohol is in your drink. In the [place] one drink equals one 12 oz bottle of beer (355 mL), one 5 oz glass of wine (148 mL), or one 1 oz glass of hard liquor (44 mL). General instructions  Schedule regular health, dental, and eye exams.  Stay current with your vaccines.  Tell your health care provider if: ? You often feel depressed. ? You have ever been abused or do not feel safe at home. Summary  Adopting a healthy lifestyle and getting preventive care are important in promoting health and wellness.  Follow your health care provider's  instructions about healthy diet, exercising, and getting tested or screened for diseases.  Follow your health care provider's instructions on monitoring your cholesterol and blood pressure. This information is not intended to replace advice given to you by your health care provider. Make sure you discuss any questions you have with your health care provider. Document Released: 05/09/2011 Document Revised: 10/17/2018 Document Reviewed: 10/17/2018 Elsevier Patient Education  2020 [employer].

## 2019-07-04 LAB — PAP, TP IMAGING W/ HPV RNA, RFLX HPV TYPE 16,18/45: HPV DNA High Risk: NOT DETECTED

## 2019-07-04 LAB — HEPATITIS C ANTIBODY
Hepatitis C Ab: NONREACTIVE
SIGNAL TO CUT-OFF: 0.01 (ref <1.00)

## 2019-07-04 LAB — HEPATITIS B CORE ANTIBODY, IGM: Hep B C IgM: NONREACTIVE

## 2019-07-04 LAB — HIV ANTIBODY (ROUTINE TESTING W REFLEX): HIV 1&2 Ab, 4th Generation: NONREACTIVE

## 2019-07-24 DIAGNOSIS — R898 Other abnormal findings in specimens from other organs, systems and tissues: Secondary | ICD-10-CM | POA: Diagnosis not present

## 2019-07-24 DIAGNOSIS — E559 Vitamin D deficiency, unspecified: Secondary | ICD-10-CM | POA: Diagnosis not present

## 2019-07-24 DIAGNOSIS — G650 Sequelae of Guillain-Barre syndrome: Secondary | ICD-10-CM | POA: Diagnosis not present

## 2019-07-29 ENCOUNTER — Encounter: Payer: Self-pay | Admitting: Gynecology

## 2019-08-15 ENCOUNTER — Other Ambulatory Visit: Payer: Self-pay | Admitting: Gastroenterology

## 2019-09-04 ENCOUNTER — Telehealth: Payer: Self-pay | Admitting: *Deleted

## 2019-09-04 NOTE — Telephone Encounter (Signed)
Have her double up on the Micronor for 2 days to see if that will get the bleeding to stop it is common to have some irregular bleeding the first couple of months on Micronor but if this would continue we could try a Mirena IUD, best not to use combination pills due to smoking.  I do not think she is currently sexually active but if she is have her check a home urine pregnancy test.

## 2019-09-04 NOTE — Telephone Encounter (Signed)
Patient called c/o bleeding x 3 weeks, reports cycle started and never stopped. Bleeding varies from medium flow to spotting, but some form of bleeding everyday, no missed pills, takes same time everyday. Was started on Micronor on 07/03/19 at annual exam due to smoking was taking Sprintec. Patient did say this would be her first cycle while on pills. Medium flow now, patient said she is tired of bleeding. Please advise

## 2019-09-05 NOTE — Telephone Encounter (Signed)
Patient informed. 

## 2019-09-09 ENCOUNTER — Telehealth: Payer: Self-pay | Admitting: *Deleted

## 2019-09-09 NOTE — Telephone Encounter (Signed)
Patient called to follow up from telephone on 09/04/19 doubled up on pills, stopped bleeding for 3 days and cycle started back for 2 days now. Patient doesn't want IUD, has this before and it fell out. Told me to is thinking about getting tubes tied. Please advise

## 2019-09-09 NOTE — Telephone Encounter (Signed)
TC options reviewed, discussed tubal ligation is abdominally performed, risks of infection, perforation, hemorrhage discussed, often considered elective and covered under deductibles, states would like to have it checked.  Other options of IUDs states has had to both have fallen out, Nexplanon, will check benefits to see about cost and coverage.  Reviewed if decides to proceed with Nexplanon placed with a menstrual cycle biggest side effect is irregular bleeding first couple of months.  Smokes several cigarettes daily, aware can use no estrogen products.  Has had irregular bleeding on Micronor.  Please check benefits for Nexplanon and tubal ligation.  Thank you

## 2019-09-11 ENCOUNTER — Telehealth: Payer: Self-pay

## 2019-09-11 NOTE — Telephone Encounter (Signed)
Laparoscopic bilateral tubal sterilization by cauterization.

## 2019-09-11 NOTE — Telephone Encounter (Signed)
Yes can send surgery orders when you have the surgery date.  Schedule Preop with me.

## 2019-09-11 NOTE — Telephone Encounter (Signed)
[place] me to [place] for lap tubal and Nexplanon. Both covered 100%. Tubal ligation will have to be scheduled in 2020 as it applies to her deductible. Patient would like to have lab tubal with you.  If okay to schedule her will you send surgery order. Thanks!

## 2019-09-11 NOTE — Telephone Encounter (Signed)
Spoke with patient and let her know Nexplanon covered 100% of allowable charge. Lap Tubal applied to ded/co-ins. But she has met her ded and oop max and ins is paying 100% through 2020. Starts over 2021.  She would like to schedule Lap Tubal. Will send message to ML and get ok/order and will call patient back to schedule.

## 2019-09-11 NOTE — Telephone Encounter (Signed)
What I mean with "surgery order" was your request for surgery sheet. Really all I need is to know how you want to do the lab tubal and I can schedule. Cautery?

## 2019-09-12 ENCOUNTER — Telehealth: Payer: Self-pay

## 2019-09-12 NOTE — Telephone Encounter (Addendum)
Patient called back asking if Dr. Marguerita Merles would remove her ovaries at same time as lap tubal. Does not want to have periods anymore. She noted that her mother went through menopause at 54 and she expects to do the same.  I explained to patient the benefits of keeping her ovaries at age 33 and how they go beyond just causing her to have a period.  We discussed that if not medically indicated that insurance would not cover. We also discussed that it would not be good practice to remove a healthy ovaries from a 33 year old.  She understood completely.

## 2019-09-12 NOTE — Telephone Encounter (Signed)
I called patient to discuss dates. Her ins is paying claims at 100%.  She would like 10/21/19 at 10:30am at [facility]. Surgery schedule. Covid screen explained and instructions for quarantine reviewed. Appt scheduled accordingly.  Pre op appt with Dr. Marguerita Merles scheduled for 10/15/19 at 11:30am.

## 2019-09-17 ENCOUNTER — Encounter: Payer: Self-pay | Admitting: Women's Health

## 2019-09-25 ENCOUNTER — Ambulatory Visit: Payer: BC Managed Care – PPO | Admitting: Family Medicine

## 2019-09-25 ENCOUNTER — Telehealth: Payer: Self-pay

## 2019-09-25 ENCOUNTER — Encounter: Payer: Self-pay | Admitting: Family Medicine

## 2019-09-25 ENCOUNTER — Other Ambulatory Visit: Payer: Self-pay

## 2019-09-25 VITALS — BP 118/78 | HR 120 | Temp 97.9°F | Ht 67.0 in | Wt 190.2 lb

## 2019-09-25 DIAGNOSIS — F1011 Alcohol abuse, in remission: Secondary | ICD-10-CM | POA: Diagnosis not present

## 2019-09-25 DIAGNOSIS — Z8669 Personal history of other diseases of the nervous system and sense organs: Secondary | ICD-10-CM

## 2019-09-25 DIAGNOSIS — R202 Paresthesia of skin: Secondary | ICD-10-CM

## 2019-09-25 DIAGNOSIS — R200 Anesthesia of skin: Secondary | ICD-10-CM

## 2019-09-25 MED ORDER — DULOXETINE HCL 30 MG PO CPEP: 30.0000 mg | ORAL_CAPSULE | Freq: Every day | ORAL | 5 refills | Status: DC

## 2019-09-25 NOTE — Progress Notes (Addendum)
PATIENT: Kimberly [name] DOB: Sep 03, 1986  REASON FOR VISIT: follow up HISTORY FROM: patient  Chief Complaint  Patient presents with   Follow-up    6 mon f/u. Alone. [place] room. Patient stated that she is having alot pain in her feet and numbness to her hands. She stated that its hard to grip stuff. She also mentioned that nerve pain is bad as well.      HISTORY OF PRESENT ILLNESS: Today 09/25/19 Kimberly [name] is a 33 y.o. female here today for follow up post GB diagnosis in 11/2018. She was advised to increase Lyrica to 200mg  TID at last visit in 03/2019. If she misses a dose she definitely notes worsening but continues to have significant tingling and sharp pains in bilateral feet and calves. She also notes continues numbness of bilateral hands (mostly thumb and first finger bilaterally). She will take Tramadol 50mg  as needed that she feels helps significantly. She has not tried duloxetine in the past. No other antidepressants. Weakness has improved significantly. She feels that she is clumsy. She reports multiple falls. She did very well with PT in March but does not feel she has time to work with PT right now. She broke left ankle in 05/2019. She rolled her ankle while walking. She initially felt pain but then states pain resolved. Swelling continued which prompted orthopedic visit. She reports being in a cast for several weeks.  She has gained 30 pounds this year. She is very active with her twins (35 year old). No vision changes or returning double vision. She did not see Dr [name]. She states that their office didn't call her. She reports drinking "every now and then." She last drank alcohol at her brother's wedding this past weekend. Drink of choice is liquor.    HISTORY: (copied from Dr [name] note on 03/25/2019)  03/25/2019: She can scratch her calf and feel her toe, strange sensory perceptions. Some days she has some feeling and then it goes away. Discussed this could be the  nerves regenerating. She still has some nerve pain but improving, but she has pain in the arches of her feet and keeps cotton balls between her toes because it hurts for them to rub together. lyrica helps, will split it tid for better coverage. She feels great, she was hiking in the woods and swimming with her kids, she had a knee injury and the sensory changes bother her but feels doing well. Will change lyrice to 200mg  tid for better coverage. F/u 3 months  Interval history 02/12/2019: Patient with a prior history of opioid abuse, GBS recently this year admitted for IVIG then discharged, admitted again for possible thiamine deficiency.  She is feeling better. Overall she is improving. She is still numb from below her breast, her legs are numb and lower legs and feet and nerve pain.She can't feel her toes and fingers. She graduated from PT. She has 35 year old twin boys. She has started riding a bike.  She has been losing hair. This is not my specialty, I'm not aware Lyrica can do this but unsure, may be stress, advised to discuss with pcp because this is not my area of expertise. She has bald spots, recommended pcp and possibly derm. She has double vision, started in the hospital the first time but worsening. She will see objects side by side, worse up closer, closing left eye makes it go away. Her family thinks she is drunk a lot due to her walking and  she sounds and looks intoxicated. These symptoms have been stable for 4 weeks.She has not been taking her B1 supplement because it was too high and then it was normal. Speech Therapy.   Interval History: She is doing well. She was discharged from the hospital about 6 days ago. She was readmitted for concerns of thiamin deficiency. She is slowly regaining feeling. She can feel in bilateral feet and hands. She can feel pressure most everywhere else with exception of her abdomen. She continues thiamin, gabapentin and Lyrica. She does continue to have numbness,  tingling and shooting pains. She is working with PT. She has finished therapy with OT. She is requesting a handicap placard and refill of her oxycodone-acetaminophen. She continues this q6hr for pain.   HPI:  Kimberly [name] is a 33 y.o. female here as requested by Dr. Hilma Favors for numbness. PMHx depression. Swelling in the feet and legs for several months, placed on diuretic around the end of the year, she became dehydrated. The pain started when the swelling reduced. She was significantly swollen, she "didn't have ankles", then they went numb in the area of the swelling. But then it started moving up the legs 5 days ago and now is up to her chest and her left shoulder and arm is tingly but she is numb. Now her mouth is numb. She has "serious weakness", she can barely walk or hold onto herself a lot of times. When she is walking she feels like her legs are going to come out from beneath her. She has not fallen. Still progressing. She is tight when she lays down flat but she cant feel anything from the neck down.   Reviewed notes, labs and imaging from outside physicians, which showed:  Patient presented on 11/06/2018 with bilateral leg pain and swelling of the ankles and feet.  Reviewed Dr. Delanna Ahmadi notes.  She was put on Lasix and had hypokalemia.  She was prescribed potassium.  Went to ED and diagnosed with dehydration.  Urine culture positive for E. coli but patient denied treatment was told UA in hospital normal.  Labs also showed elevated MCV of 106.  Prior history of alcoholism treated 2 years ago and denies current use.  Believes she was tested for B12 and folate no lab results in chart TSH was normal in July 2019.  Still complaining of nausea and vomiting.TSH 0.78. B12, folate was drawn no results yet.    REVIEW OF SYSTEMS: Out of a complete 14 system review of symptoms, the patient complains only of the following symptoms,  Memory loss, numbness and all other reviewed systems are  negative.  ALLERGIES: Allergies  Allergen Reactions   Tape Itching and Other (See Comments)    Clear, "plastic" tape is the culprit    HOME MEDICATIONS: Outpatient Medications Prior to Visit  Medication Sig Dispense Refill   calcium carbonate (TUMS EX) 750 MG chewable tablet Chew 1-2 tablets by mouth as needed (for heartburn or indigestion).     ibuprofen (ADVIL,MOTRIN) 200 MG tablet Take 800 mg by mouth every 6 (six) hours as needed for mild pain or moderate pain.     loperamide (IMODIUM) 2 MG capsule Take 2 mg by mouth as needed for diarrhea or loose stools.      norethindrone (ORTHO MICRONOR) 0.35 MG tablet Take 1 tablet (0.35 mg total) by mouth daily. 3 Package 4   ondansetron (ZOFRAN) 4 MG tablet Take every 4-6 hours as needed. 30 tablet 1   pantoprazole (PROTONIX) 40  MG tablet TAKE 1 TABLET BY MOUTH DAILY. TAKE 30-60 MINUTES BEFORE BREAKFAST 90 tablet 0   polyethylene glycol (MIRALAX / GLYCOLAX) packet Take 17 g by mouth daily as needed for mild constipation.     pregabalin (LYRICA) 200 MG capsule Take 1 capsule (200 mg total) by mouth 3 (three) times daily. 270 capsule 1   Prenatal Vit-Fe Fumarate-FA (PRENATAL VITAMIN) 27-0.8 MG TABS Take 1 tablet by mouth daily. 90 tablet 6   thiamine (VITAMIN B-1) 100 MG tablet Take 1 tablet (100 mg total) by mouth daily. 30 tablet 0   traMADol (ULTRAM) 50 MG tablet Take 50 mg by mouth every 4 (four) hours as needed.     hydrocortisone (ANUSOL-HC) 2.5 % rectal cream Place 1 application rectally 2 (two) times daily. Use for 14 days 30 g 1   Multiple Vitamin (MULTIVITAMIN) tablet Take 1 tablet by mouth daily.     No facility-administered medications prior to visit.     PAST MEDICAL HISTORY: Past Medical History:  Diagnosis Date   Abnormal Pap smear    ADD (attention deficit disorder)    Anxiety    Family history of anesthesia complication    mother has difficult  time going to sleep   Guillain Barr syndrome ([place])     Headache(784.0)    HSV-1 infection    IBS (irritable bowel syndrome)    Knee hyperextension injury 2020   Left, being treated by ortho   Tachycardia     PAST SURGICAL HISTORY: Past Surgical History:  Procedure Laterality Date   BREAST BIOPSY Right    BREAST SURGERY     Biopsy-benign   CESAREAN SECTION  09/13/2012   Procedure: CESAREAN SECTION;  Surgeon: Lavonia Drafts, MD;  Location: [facility];  Service: Obstetrics;  Laterality: N/A;   cortisone shot R wrist Right 2020   KYPHOPLASTY N/A 08/27/2013   Procedure: Thoracic Twelve Kyphoplasty;  Surgeon: Erline Levine, MD;  Location: [facility];  Service: Neurosurgery;  Laterality: N/A;  T12 Kyphoplasty   WISDOM TOOTH EXTRACTION     x4   WRIST SURGERY Right 07-23-2013    FAMILY HISTORY: Family History  Problem Relation Age of Onset   Depression Mother        chronic   Hearing loss Mother    Hyperlipidemia Mother    Vision loss Mother        beachets disease   COPD Father    Diabetes Father    Cancer Maternal Grandmother 81       breast cancer   Parkinsonism Maternal Grandmother    Heart disease Maternal Grandmother    Breast cancer Maternal Grandmother    Alzheimer's disease Paternal Grandmother    Heart disease Paternal Grandfather        Died age 55   Nephrolithiasis Paternal Grandfather    Heart disease Maternal Grandfather    Cancer Other 7       breast   Breast cancer Other     SOCIAL HISTORY: Social History   Socioeconomic History   Marital status: Married    Spouse name: Not on file   Number of children: 2   Years of education: Not on file   Highest education level: Not on file  Occupational History   Occupation: In home health   Social Needs   Financial resource strain: Not on file   Food insecurity    Worry: Not on file    Inability: Not on file   Transportation needs  Medical: Not on file    Non-medical: Not on file  Tobacco Use   Smoking status:  Current Every Day Smoker    Packs/day: 0.50    Years: 10.00    Pack years: 5.00    Types: Cigarettes    Last attempt to quit: 01/31/2012    Years since quitting: 7.6   Smokeless tobacco: Never Used   Tobacco comment: pt has decreased her use to 1-2 cigarettes per day   Substance and Sexual Activity   Alcohol use: Yes    Comment: occasionally   Drug use: No   Sexual activity: Yes    Birth control/protection: Pill, Condom    Comment: 1st intercourse 33 yo-More than 5 partners  Lifestyle   Physical activity    Days per week: Not on file    Minutes per session: Not on file   Stress: Not on file  Relationships   Social connections    Talks on phone: Not on file    Gets together: Not on file    Attends religious service: Not on file    Active member of club or organization: Not on file    Attends meetings of clubs or organizations: Not on file    Relationship status: Not on file   Intimate partner violence    Fear of current or ex partner: Not on file    Emotionally abused: Not on file    Physically abused: Not on file    Forced sexual activity: Not on file  Other Topics Concern   Not on file  Social History Narrative   Lives with husband and twin sons       PHYSICAL EXAM  Vitals:   09/25/19 1117  BP: 118/78  Pulse: (!) 120  Temp: 97.9 F (36.6 C)  TempSrc: Oral  Weight: 190 lb 3.2 oz (86.3 kg)  Height: 5\' 7"  (1.702 m)   Body mass index is 29.79 kg/m.  Generalized: Well developed, in no acute distress  Cardiology: normal rate and rhythm, no murmur noted Respiratory: clear to auscultation bilaterally Neurological examination  Mentation: Alert oriented to time, place, history taking. Follows all commands speech and language fluent Cranial nerve II-XII: Pupils were equal round reactive to light. Extraocular movements were full, visual field were full on confrontational test. Facial sensation and strength were normal. Uvula tongue midline. Head turning and  shoulder shrug  were normal and symmetric. Motor: The motor testing reveals 5 over 5 strength of all 4 extremities. Good symmetric motor tone is noted throughout.  Sensory: Sensory testing is intact to soft touch on all 4 extremities. No evidence of extinction is noted.  Coordination: Cerebellar testing reveals good finger-nose-finger and heel-to-shin bilaterally.  Gait and station: Gait is normal. Romberg is negative. No drift is seen.  Reflexes: Deep tendon reflexes are symmetric bilaterally, decreased but symmetric patellar reflex.   DIAGNOSTIC DATA (LABS, IMAGING, TESTING) - I reviewed patient records, labs, notes, testing and imaging myself where available.  No flowsheet data found.   Lab Results  Component Value Date   WBC 6.2 12/07/2018   HGB 13.8 12/07/2018   HCT 41.4 12/07/2018   MCV 101.0 (H) 12/07/2018   PLT 303 12/07/2018      Component Value Date/Time   NA 141 12/07/2018 0524   K 3.9 12/07/2018 0524   CL 113 (H) 12/07/2018 0524   CO2 21 (L) 12/07/2018 0524   GLUCOSE 92 12/07/2018 0524   BUN 6 12/07/2018 0524   CREATININE 0.58 12/07/2018 0524  CALCIUM 8.9 12/07/2018 0524   PROT 6.6 12/07/2018 0524   ALBUMIN 3.1 (L) 12/07/2018 0524   AST 33 12/07/2018 0524   ALT 29 12/07/2018 0524   ALKPHOS 40 12/07/2018 0524   BILITOT 0.7 12/07/2018 0524   GFRNONAA >60 12/07/2018 0524   GFRAA >60 12/07/2018 0524   No results found for: CHOL, HDL, LDLCALC, LDLDIRECT, TRIG, CHOLHDL Lab Results  Component Value Date   HGBA1C 4.9 11/16/2018   Lab Results  Component Value Date   VITAMINB12 205 (L) 02/13/2019   Lab Results  Component Value Date   TSH 1.618 12/06/2018     ASSESSMENT AND PLAN 33 y.o. year old female  has a past medical history of Abnormal Pap smear, ADD (attention deficit disorder), Anxiety, Family history of anesthesia complication, Guillain Barr syndrome ([place]), Headache(784.0), HSV-1 infection, IBS (irritable bowel syndrome), Knee hyperextension injury  (2020), and Tachycardia. here with     ICD-10-CM   1. Bilateral numbness and tingling of arms and legs  R20.0    R20.2   2. History of Guillain-Barre syndrome  Z86.69   3. History of alcohol abuse  F10.11     Florenda continues to note numbness and tingling in bilateral upper and lower extremities, most bothersome of the bilateral feet.  Fortunately, she has noted significant improvement in weakness.  She has not had any additional concerns of double vision.  She continues Lyrica 200 mg 3 times daily.  She does feel that this helps as she notes significant worsening if she misses her medication.  She has never tried duloxetine.  I would like to start her on 30 mg daily.  We have discussed potential side effects of this medication.  She is not on any other antidepressants and has no concerns of depression at this time.  We will monitor her closely.  I will also call in neuropathy cream.  She is aware to listen out for a phone call from the specialty pharmacy with directions on how to obtain this medication.  I have advised that she consider physical therapy.  She reports that she does not have the time or the money to participate right now but will consider in the future.  We have also discussed considering pain management, however, I am did not do due to her previous history with alcohol and polysubstance abuse.  I do not recommend opioid therapy.  I have explained to her that opioids are not traditionally recommended with neuropathic pain.  She will follow-up closely with primary care and orthopedics as needed.  She will follow-up with me in 6 months, sooner if needed.  She verbalizes understanding and agreement with this plan.   No orders of the defined types were placed in this encounter.    Meds ordered this encounter  Medications   DULoxetine (CYMBALTA) 30 MG capsule    Sig: Take 1 capsule (30 mg total) by mouth daily.    Dispense:  30 capsule    Refill:  5    Order Specific Question:    Supervising Provider    Answer:   Melvenia Beam [id number]      I spent 15 minutes with the patient. 50% of this time was spent counseling and educating patient on plan of care and medications.    Debbora Presto, FNP-C 09/25/2019, 12:27 PM [facility] [address], [place], [place] [address]  Made any corrections needed, and agree with history, physical, neuro exam,assessment and plan as stated.  Sarina Ill, MD [facility]  [phone]

## 2019-09-25 NOTE — Patient Instructions (Addendum)
We will continue Lyrica 200mg  three times daily  Start duloxetine 30mg  daily  I will call in neuropathy cream, expect call from specialty pharmacy  We will consider pain management consult after discussion with Dr Jaynee Eagles  Follow up in 6 months, sooner if needed   Neuropathic Pain Neuropathic pain is pain caused by damage to the nerves that are responsible for certain sensations in your body (sensory nerves). The pain can be caused by:  Damage to the sensory nerves that send signals to your spinal cord and brain (peripheral nervous system).  Damage to the sensory nerves in your brain or spinal cord (central nervous system). Neuropathic pain can make you more sensitive to pain. Even a minor sensation can feel very painful. This is usually a long-term condition that can be difficult to treat. The type of pain differs from person to person. It may:  Start suddenly (acute), or it may develop slowly and last for a long time (chronic).  Come and go as damaged nerves heal, or it may stay at the same level for years.  Cause emotional distress, loss of sleep, and a lower quality of life. What are the causes? The most common cause of this condition is diabetes. Many other diseases and conditions can also cause neuropathic pain. Causes of neuropathic pain can be classified as:  Toxic. This is caused by medicines and chemicals. The most common cause of toxic neuropathic pain is damage from cancer treatments (chemotherapy).  Metabolic. This can be caused by: ? Diabetes. This is the most common disease that damages the nerves. ? Lack of vitamin B from long-term alcohol abuse.  Traumatic. Any injury that cuts, crushes, or stretches a nerve can cause damage and pain. A common example is feeling pain after losing an arm or leg (phantom limb pain).  Compression-related. If a sensory nerve gets trapped or compressed for a long period of time, the blood supply to the nerve can be cut off.  Vascular.  Many blood vessel diseases can cause neuropathic pain by decreasing blood supply and oxygen to nerves.  Autoimmune. This type of pain results from diseases in which the body's defense system (immune system) mistakenly attacks sensory nerves. Examples of autoimmune diseases that can cause neuropathic pain include lupus and multiple sclerosis.  Infectious. Many types of viral infections can damage sensory nerves and cause pain. Shingles infection is a common cause of this type of pain.  Inherited. Neuropathic pain can be a symptom of many diseases that are passed down through families (genetic). What increases the risk? You are more likely to develop this condition if:  You have diabetes.  You smoke.  You drink too much alcohol.  You are taking certain medicines, including medicines that kill cancer cells (chemotherapy) or that treat immune system disorders. What are the signs or symptoms? The main symptom is pain. Neuropathic pain is often described as:  Burning.  Shock-like.  Stinging.  Hot or cold.  Itching. How is this diagnosed? No single test can diagnose neuropathic pain. It is diagnosed based on:  Physical exam and your symptoms. Your health care provider will ask you about your pain. You may be asked to use a pain scale to describe how bad your pain is.  Tests. These may be done to see if you have a high sensitivity to pain and to help find the cause and location of any sensory nerve damage. They include: ? Nerve conduction studies to test how well nerve signals travel through your sensory  nerves (electrodiagnostic testing). ? Stimulating your sensory nerves through electrodes on your skin and measuring the response in your spinal cord and brain (somatosensory evoked potential).  Imaging studies, such as: ? X-rays. ? CT scan. ? MRI. How is this treated? Treatment for neuropathic pain may change over time. You may need to try different treatment options or a  combination of treatments. Some options include:  Treating the underlying cause of the neuropathy, such as diabetes, kidney disease, or vitamin deficiencies.  Stopping medicines that can cause neuropathy, such as chemotherapy.  Medicine to relieve pain. Medicines may include: ? Prescription or over-the-counter pain medicine. ? Anti-seizure medicine. ? Antidepressant medicines. ? Pain-relieving patches that are applied to painful areas of skin. ? A medicine to numb the area (local anesthetic), which can be injected as a nerve block.  Transcutaneous nerve stimulation. This uses electrical currents to block painful nerve signals. The treatment is painless.  Alternative treatments, such as: ? Acupuncture. ? Meditation. ? Massage. ? Physical therapy. ? Pain management programs. ? Counseling. Follow these instructions at home: Medicines   Take over-the-counter and prescription medicines only as told by your health care provider.  Do not drive or use heavy machinery while taking prescription pain medicine.  If you are taking prescription pain medicine, take actions to prevent or treat constipation. Your health care provider may recommend that you: ? Drink enough fluid to keep your urine pale yellow. ? Eat foods that are high in fiber, such as fresh fruits and vegetables, whole grains, and beans. ? Limit foods that are high in fat and processed sugars, such as fried or sweet foods. ? Take an over-the-counter or prescription medicine for constipation. Lifestyle   Have a good support system at home.  Consider joining a chronic pain support group.  Do not use any products that contain nicotine or tobacco, such as cigarettes and e-cigarettes. If you need help quitting, ask your health care provider.  Do not drink alcohol. General instructions  Learn as much as you can about your condition.  Work closely with all your health care providers to find the treatment plan that works  best for you.  Ask your health care provider what activities are safe for you.  Keep all follow-up visits as told by your health care provider. This is important. Contact a health care provider if:  Your pain treatments are not working.  You are having side effects from your medicines.  You are struggling with tiredness (fatigue), mood changes, depression, or anxiety. Summary  Neuropathic pain is pain caused by damage to the nerves that are responsible for certain sensations in your body (sensory nerves).  Neuropathic pain may come and go as damaged nerves heal, or it may stay at the same level for years.  Neuropathic pain is usually a long-term condition that can be difficult to treat. Consider joining a chronic pain support group. This information is not intended to replace advice given to you by your health care provider. Make sure you discuss any questions you have with your health care provider. Document Released: 07/21/2004 Document Revised: 02/14/2019 Document Reviewed: 11/10/2017 Elsevier Patient Education  2020 [employer].

## 2019-09-25 NOTE — Telephone Encounter (Signed)
Transdermal paperwork has been filled and signed by Debbora Presto, NP and faxed back to [employer]. Confirmation fax has been received.

## 2019-10-08 ENCOUNTER — Telehealth: Payer: Self-pay

## 2019-10-08 NOTE — Telephone Encounter (Signed)
Received a Fax from [employer] "Notification of alternative Dispensation."  For NP Amy Lomax.   Formula: "Meloxicam .05%/Doxepin 3%, /Amantadine 3%/Dextromethorhan 2%/Lidocaine 2%"  Reviewed with NP Amy Lomax. She states that its okay.

## 2019-10-14 DIAGNOSIS — Z6830 Body mass index (BMI) 30.0-30.9, adult: Secondary | ICD-10-CM | POA: Diagnosis not present

## 2019-10-14 DIAGNOSIS — L03031 Cellulitis of right toe: Secondary | ICD-10-CM | POA: Diagnosis not present

## 2019-10-14 DIAGNOSIS — M79676 Pain in unspecified toe(s): Secondary | ICD-10-CM | POA: Diagnosis not present

## 2019-10-14 DIAGNOSIS — L03032 Cellulitis of left toe: Secondary | ICD-10-CM | POA: Diagnosis not present

## 2019-10-14 DIAGNOSIS — E6609 Other obesity due to excess calories: Secondary | ICD-10-CM | POA: Diagnosis not present

## 2019-10-15 ENCOUNTER — Other Ambulatory Visit: Payer: Self-pay

## 2019-10-15 ENCOUNTER — Encounter: Payer: Self-pay | Admitting: Obstetrics & Gynecology

## 2019-10-15 ENCOUNTER — Ambulatory Visit: Payer: BC Managed Care – PPO | Admitting: Obstetrics & Gynecology

## 2019-10-15 ENCOUNTER — Encounter (HOSPITAL_BASED_OUTPATIENT_CLINIC_OR_DEPARTMENT_OTHER): Payer: Self-pay | Admitting: *Deleted

## 2019-10-15 ENCOUNTER — Encounter (HOSPITAL_COMMUNITY)
Admission: RE | Admit: 2019-10-15 | Discharge: 2019-10-15 | Disposition: A | Discharge status: Home / Self Care | Payer: BC Managed Care – PPO | Source: Ambulatory Visit | Attending: Obstetrics & Gynecology | Admitting: Obstetrics & Gynecology

## 2019-10-15 VITALS — BP 136/84

## 2019-10-15 DIAGNOSIS — Z308 Encounter for other contraceptive management: Secondary | ICD-10-CM | POA: Diagnosis not present

## 2019-10-15 DIAGNOSIS — Z79899 Other long term (current) drug therapy: Secondary | ICD-10-CM | POA: Diagnosis not present

## 2019-10-15 DIAGNOSIS — Z302 Encounter for sterilization: Secondary | ICD-10-CM | POA: Diagnosis present

## 2019-10-15 DIAGNOSIS — F329 Major depressive disorder, single episode, unspecified: Secondary | ICD-10-CM | POA: Diagnosis not present

## 2019-10-15 DIAGNOSIS — F17210 Nicotine dependence, cigarettes, uncomplicated: Secondary | ICD-10-CM | POA: Diagnosis not present

## 2019-10-15 DIAGNOSIS — Z793 Long term (current) use of hormonal contraceptives: Secondary | ICD-10-CM | POA: Diagnosis not present

## 2019-10-15 DIAGNOSIS — Z01818 Encounter for other preprocedural examination: Secondary | ICD-10-CM | POA: Diagnosis not present

## 2019-10-15 DIAGNOSIS — K219 Gastro-esophageal reflux disease without esophagitis: Secondary | ICD-10-CM | POA: Diagnosis not present

## 2019-10-15 DIAGNOSIS — F419 Anxiety disorder, unspecified: Secondary | ICD-10-CM | POA: Diagnosis not present

## 2019-10-15 LAB — CBC
HCT: 43.3 % (ref 36.0–46.0)
Hemoglobin: 14.3 g/dL (ref 12.0–15.0)
MCH: 32.5 pg (ref 26.0–34.0)
MCHC: 33 g/dL (ref 30.0–36.0)
MCV: 98.4 fL (ref 80.0–100.0)
Platelets: 251 K/uL (ref 150–400)
RBC: 4.4 MIL/uL (ref 3.87–5.11)
RDW: 12.7 % (ref 11.5–15.5)
WBC: 10.3 K/uL (ref 4.0–10.5)
nRBC: 0 % (ref 0.0–0.2)

## 2019-10-15 LAB — ABO/RH: ABO/RH(D): O NEG

## 2019-10-15 NOTE — Progress Notes (Signed)
    Kimberly [name] [birth year] [id number]        33 y.o.  [id number] Married.  Twin sons are 28 yo.  RP: Preop visit: [facility] for Tubal Sterilization  HPI: Certain of her decision to proceed with a Tubal Sterilization.  Twin sons are healthy and husband has 5 older children from a previous marriage.  Had twins by C/S, no Cx.  Well on BCPs.  No pelvic pain.  STI screen/Pap negative 06/2019.  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   OB History  Gravida Para Term Preterm AB Living  1 1 0 1 0 2  SAB TAB Ectopic Multiple Live Births  0 0 0 1 2    # Outcome Date GA Lbr Len/2nd Weight Sex Delivery Anes PTL Lv  1A Preterm 09/13/12 [redacted]w[redacted]d  5 lb 4.3 oz (2.39 kg) M CS-LTranv Spinal  LIV  1B Preterm 09/13/12 [redacted]w[redacted]d  3 lb 6.7 oz (1.551 kg) M CS-LVertical Spinal  LIV    Past medical history,surgical history, problem list, medications, allergies, family history and social history were all reviewed and documented in the EPIC chart.   Directed ROS with pertinent positives and negatives documented in the history of present illness/assessment and plan.  Exam:  Vitals:   10/15/19 1125  BP: 136/84   General appearance:  Normal  Abdomen: Normal  Gynecologic exam: Vulva normal.  Bimanual exam:  Uterus AV, normal volume, mobile, NT.  No adnexal mass, NT bilaterally.   Assessment/Plan:  33 y.o. G1P0102   1. Encounter for tubal ligation counseling [facility] for bilateral tubal sterilization.  Certain of wanting a definitive method to prevent pregnancies.  Currently on birth control pills.  Decision to proceed with a laparoscopic bilateral tubal sterilization with cauterization.  Preop precautions reviewed including fasting for at least 6 hours.  Will only take Lyrica with a small sip of water the morning of surgery.  Surgical procedure with risks and benefits reviewed, including risks of anesthesia, risk of trauma to the internal organs, risks of infection, risks of DVT/pulmonary  embolism.  Postop management reviewed.  Patient will use ibuprofen 600 mg p.o. every 6 hours as needed.  Percocet will be prescribed if ibuprofen is not sufficient especially in the first 24 hours.  Will avoid heavy lifting and excess abdominal pressure for 1 week after surgery.  No intercourse for 2 weeks after surgery.                        Patient was counseled as to the risk of surgery to include the following:  1. Infection (prohylactic antibiotics will be administered)  2. DVT/Pulmonary Embolism (prophylactic pneumo compression stockings will be used)  3.Trauma to internal organs requiring additional surgical procedure to repair any injury to internal organs requiring perhaps additional hospitalization days.  4.Hemmorhage requiring transfusion and blood products which carry risks such as anaphylactic reaction, hepatitis and AIDS  Patient had received literature information on the procedure scheduled and all her questions were answered and fully accepts all risk.   Counseling on above issues and coordination of care more than 50% for 25 minutes.  Princess Bruins MD, 11:33 AM 10/15/2019

## 2019-10-15 NOTE — Progress Notes (Signed)
Spoke with patient via telephone for pre op phone call. NPO after MN. Patient to take Cymbalta, Protonix and Lyrica with a sip of water AM of surgery. Patient will need a UPT AM of surgery. Arrival time 0800.

## 2019-10-15 NOTE — Patient Instructions (Signed)
1. Encounter for tubal ligation counseling [facility] for bilateral tubal sterilization.  Certain of wanting a definitive method to prevent pregnancies.  Currently on birth control pills.  Decision to proceed with a laparoscopic bilateral tubal sterilization with cauterization.  Preop precautions reviewed including fasting for at least 6 hours.  Will only take Lyrica with a small sip of water the morning of surgery.  Surgical procedure with risks and benefits reviewed, including risks of anesthesia, risk of trauma to the internal organs, risks of infection, risks of DVT/pulmonary embolism.  Postop management reviewed.  Patient will use ibuprofen 600 mg p.o. every 6 hours as needed.  Percocet will be prescribed if ibuprofen is not sufficient especially in the first 24 hours.  Will avoid heavy lifting and excess abdominal pressure for 1 week after surgery.  No intercourse for 2 weeks after surgery.                        Patient was counseled as to the risk of surgery to include the following:  1. Infection (prohylactic antibiotics will be administered)  2. DVT/Pulmonary Embolism (prophylactic pneumo compression stockings will be used)  3.Trauma to internal organs requiring additional surgical procedure to repair any injury to internal organs requiring perhaps additional hospitalization days.  4.Hemmorhage requiring transfusion and blood products which carry risks such as anaphylactic reaction, hepatitis and AIDS  Patient had received literature information on the procedure scheduled and all her questions were answered and fully accepts all risk.  Kimberly [name], it was a pleasure meeting you today!

## 2019-10-17 ENCOUNTER — Other Ambulatory Visit (HOSPITAL_COMMUNITY)
Admission: RE | Admit: 2019-10-17 | Discharge: 2019-10-17 | Disposition: A | Discharge status: Home / Self Care | Payer: BC Managed Care – PPO | Source: Ambulatory Visit | Attending: Obstetrics & Gynecology | Admitting: Obstetrics & Gynecology

## 2019-10-17 DIAGNOSIS — Z01812 Encounter for preprocedural laboratory examination: Secondary | ICD-10-CM | POA: Diagnosis not present

## 2019-10-17 DIAGNOSIS — Z20828 Contact with and (suspected) exposure to other viral communicable diseases: Secondary | ICD-10-CM | POA: Diagnosis not present

## 2019-10-18 LAB — NOVEL CORONAVIRUS, NAA (HOSP ORDER, SEND-OUT TO REF LAB; TAT 18-24 HRS): SARS-CoV-2, NAA: NOT DETECTED

## 2019-10-21 ENCOUNTER — Ambulatory Visit (HOSPITAL_BASED_OUTPATIENT_CLINIC_OR_DEPARTMENT_OTHER): Payer: BC Managed Care – PPO | Admitting: Anesthesiology

## 2019-10-21 ENCOUNTER — Ambulatory Visit (HOSPITAL_BASED_OUTPATIENT_CLINIC_OR_DEPARTMENT_OTHER)
Admission: RE | Admit: 2019-10-21 | Discharge: 2019-10-21 | Disposition: A | Discharge status: Home / Self Care | Payer: BC Managed Care – PPO | Attending: Obstetrics & Gynecology | Admitting: Obstetrics & Gynecology

## 2019-10-21 ENCOUNTER — Encounter (HOSPITAL_BASED_OUTPATIENT_CLINIC_OR_DEPARTMENT_OTHER): Payer: Self-pay | Admitting: Obstetrics & Gynecology

## 2019-10-21 ENCOUNTER — Encounter (HOSPITAL_BASED_OUTPATIENT_CLINIC_OR_DEPARTMENT_OTHER)
Admission: RE | Disposition: A | Discharge status: Home / Self Care | Payer: Self-pay | Source: Home / Self Care | Attending: Obstetrics & Gynecology

## 2019-10-21 DIAGNOSIS — K219 Gastro-esophageal reflux disease without esophagitis: Secondary | ICD-10-CM | POA: Insufficient documentation

## 2019-10-21 DIAGNOSIS — Z79899 Other long term (current) drug therapy: Secondary | ICD-10-CM | POA: Insufficient documentation

## 2019-10-21 DIAGNOSIS — F329 Major depressive disorder, single episode, unspecified: Secondary | ICD-10-CM | POA: Insufficient documentation

## 2019-10-21 DIAGNOSIS — F17210 Nicotine dependence, cigarettes, uncomplicated: Secondary | ICD-10-CM | POA: Insufficient documentation

## 2019-10-21 DIAGNOSIS — F419 Anxiety disorder, unspecified: Secondary | ICD-10-CM | POA: Insufficient documentation

## 2019-10-21 DIAGNOSIS — Z302 Encounter for sterilization: Secondary | ICD-10-CM | POA: Insufficient documentation

## 2019-10-21 DIAGNOSIS — Z793 Long term (current) use of hormonal contraceptives: Secondary | ICD-10-CM | POA: Insufficient documentation

## 2019-10-21 HISTORY — PX: LAPAROSCOPIC TUBAL LIGATION: SHX1937

## 2019-10-21 LAB — TYPE AND SCREEN
ABO/RH(D): O NEG
Antibody Screen: NEGATIVE

## 2019-10-21 LAB — POCT PREGNANCY, URINE: Preg Test, Ur: NEGATIVE

## 2019-10-21 SURGERY — LIGATION, FALLOPIAN TUBE, LAPAROSCOPIC
Anesthesia: General | Site: Abdomen | Laterality: Bilateral

## 2019-10-21 MED ORDER — FENTANYL CITRATE (PF) 100 MCG/2ML IJ SOLN
INTRAMUSCULAR | Status: DC | PRN
  Administered 2019-10-21 (×4): 50 ug via INTRAVENOUS

## 2019-10-21 MED ORDER — PROPOFOL 10 MG/ML IV BOLUS
INTRAVENOUS | Status: AC
  Filled 2019-10-21: qty 20

## 2019-10-21 MED ORDER — DEXAMETHASONE SODIUM PHOSPHATE 10 MG/ML IJ SOLN
INTRAMUSCULAR | Status: AC
  Filled 2019-10-21: qty 1

## 2019-10-21 MED ORDER — OXYCODONE HCL 5 MG/5ML PO SOLN
5.0000 mg | Freq: Once | ORAL | Status: AC | PRN
  Filled 2019-10-21: qty 5

## 2019-10-21 MED ORDER — DEXAMETHASONE SODIUM PHOSPHATE 10 MG/ML IJ SOLN
INTRAMUSCULAR | Status: DC | PRN
  Administered 2019-10-21: 10 mg via INTRAVENOUS

## 2019-10-21 MED ORDER — ROCURONIUM BROMIDE 50 MG/5ML IV SOSY
PREFILLED_SYRINGE | INTRAVENOUS | Status: DC | PRN
  Administered 2019-10-21: 50 mg via INTRAVENOUS
  Administered 2019-10-21: 10 mg via INTRAVENOUS

## 2019-10-21 MED ORDER — FENTANYL CITRATE (PF) 100 MCG/2ML IJ SOLN
25.0000 ug | INTRAMUSCULAR | Status: DC | PRN
  Administered 2019-10-21 (×2): 50 ug via INTRAVENOUS
  Filled 2019-10-21: qty 1

## 2019-10-21 MED ORDER — FENTANYL CITRATE (PF) 100 MCG/2ML IJ SOLN
INTRAMUSCULAR | Status: AC
  Filled 2019-10-21: qty 2

## 2019-10-21 MED ORDER — LACTATED RINGERS IV SOLN
INTRAVENOUS | Status: DC
  Filled 2019-10-21: qty 1000

## 2019-10-21 MED ORDER — LACTATED RINGERS IV SOLN
INTRAVENOUS | Status: DC
  Administered 2019-10-21: 09:00:00 via INTRAVENOUS
  Filled 2019-10-21: qty 1000

## 2019-10-21 MED ORDER — KETOROLAC TROMETHAMINE 30 MG/ML IJ SOLN
INTRAMUSCULAR | Status: DC | PRN
  Administered 2019-10-21: 30 mg via INTRAVENOUS

## 2019-10-21 MED ORDER — LIDOCAINE 2% (20 MG/ML) 5 ML SYRINGE
INTRAMUSCULAR | Status: AC
  Filled 2019-10-21: qty 5

## 2019-10-21 MED ORDER — SCOPOLAMINE 1 MG/3DAYS TD PT72
MEDICATED_PATCH | TRANSDERMAL | Status: AC
  Filled 2019-10-21: qty 1

## 2019-10-21 MED ORDER — ONDANSETRON HCL 4 MG/2ML IJ SOLN
4.0000 mg | Freq: Once | INTRAMUSCULAR | Status: DC | PRN
  Filled 2019-10-21: qty 2

## 2019-10-21 MED ORDER — ACETAMINOPHEN 500 MG PO TABS
ORAL_TABLET | ORAL | Status: AC
  Filled 2019-10-21: qty 2

## 2019-10-21 MED ORDER — ACETAMINOPHEN 325 MG PO TABS
ORAL_TABLET | ORAL | Status: DC | PRN
  Administered 2019-10-21: 1000 mg via ORAL

## 2019-10-21 MED ORDER — CEFAZOLIN SODIUM-DEXTROSE 2-4 GM/100ML-% IV SOLN
2.0000 g | INTRAVENOUS | Status: AC
  Administered 2019-10-21: 2 g via INTRAVENOUS
  Filled 2019-10-21: qty 100

## 2019-10-21 MED ORDER — ONDANSETRON HCL 4 MG/2ML IJ SOLN
INTRAMUSCULAR | Status: AC
  Filled 2019-10-21: qty 2

## 2019-10-21 MED ORDER — ONDANSETRON HCL 4 MG/2ML IJ SOLN
INTRAMUSCULAR | Status: DC | PRN
  Administered 2019-10-21: 4 mg via INTRAVENOUS

## 2019-10-21 MED ORDER — ROCURONIUM BROMIDE 10 MG/ML (PF) SYRINGE
PREFILLED_SYRINGE | INTRAVENOUS | Status: AC
  Filled 2019-10-21: qty 10

## 2019-10-21 MED ORDER — BUPIVACAINE HCL (PF) 0.25 % IJ SOLN
INTRAMUSCULAR | Status: DC | PRN
  Administered 2019-10-21: 7 mL

## 2019-10-21 MED ORDER — MIDAZOLAM HCL 2 MG/2ML IJ SOLN
INTRAMUSCULAR | Status: AC
  Filled 2019-10-21: qty 2

## 2019-10-21 MED ORDER — SCOPOLAMINE 1 MG/3DAYS TD PT72
MEDICATED_PATCH | TRANSDERMAL | Status: DC | PRN
  Administered 2019-10-21: 1 via TRANSDERMAL

## 2019-10-21 MED ORDER — OXYCODONE HCL 5 MG PO TABS
ORAL_TABLET | ORAL | Status: AC
  Filled 2019-10-21: qty 1

## 2019-10-21 MED ORDER — LIDOCAINE 2% (20 MG/ML) 5 ML SYRINGE
INTRAMUSCULAR | Status: DC | PRN
  Administered 2019-10-21: 100 mg via INTRAVENOUS

## 2019-10-21 MED ORDER — KETOROLAC TROMETHAMINE 30 MG/ML IJ SOLN
INTRAMUSCULAR | Status: AC
  Filled 2019-10-21: qty 1

## 2019-10-21 MED ORDER — PROPOFOL 10 MG/ML IV BOLUS
INTRAVENOUS | Status: DC | PRN
  Administered 2019-10-21: 50 mg via INTRAVENOUS
  Administered 2019-10-21: 200 mg via INTRAVENOUS
  Administered 2019-10-21: 50 mg via INTRAVENOUS

## 2019-10-21 MED ORDER — MIDAZOLAM HCL 5 MG/5ML IJ SOLN
INTRAMUSCULAR | Status: DC | PRN
  Administered 2019-10-21: 2 mg via INTRAVENOUS

## 2019-10-21 MED ORDER — OXYCODONE-ACETAMINOPHEN 7.5-325 MG PO TABS: 1.0000 | ORAL_TABLET | Freq: Four times a day (QID) | ORAL | 0 refills | Status: DC | PRN

## 2019-10-21 MED ORDER — SUGAMMADEX SODIUM 200 MG/2ML IV SOLN
INTRAVENOUS | Status: DC | PRN
  Administered 2019-10-21 (×2): 180 mg via INTRAVENOUS

## 2019-10-21 MED ORDER — CEFAZOLIN SODIUM-DEXTROSE 2-4 GM/100ML-% IV SOLN
INTRAVENOUS | Status: AC
  Filled 2019-10-21: qty 100

## 2019-10-21 MED ORDER — OXYCODONE HCL 5 MG PO TABS
5.0000 mg | ORAL_TABLET | Freq: Once | ORAL | Status: AC | PRN
  Administered 2019-10-21: 5 mg via ORAL
  Filled 2019-10-21: qty 1

## 2019-10-21 SURGICAL SUPPLY — 31 items
ADH SKN CLS APL DERMABOND .7 (GAUZE/BANDAGES/DRESSINGS) ×1
APL SWBSTK 6 STRL LF DISP (MISCELLANEOUS)
APPLICATOR COTTON TIP 6 STRL (MISCELLANEOUS) IMPLANT
APPLICATOR COTTON TIP 6IN STRL (MISCELLANEOUS)
CATH ROBINSON RED A/P 16FR (CATHETERS) ×2 IMPLANT
COVER WAND RF STERILE (DRAPES) ×2 IMPLANT
DERMABOND ADVANCED (GAUZE/BANDAGES/DRESSINGS) ×1
DERMABOND ADVANCED .7 DNX12 (GAUZE/BANDAGES/DRESSINGS) ×1 IMPLANT
DRSG OPSITE POSTOP 3X4 (GAUZE/BANDAGES/DRESSINGS) ×1 IMPLANT
DURAPREP 26ML APPLICATOR (WOUND CARE) ×2 IMPLANT
ELECT REM PT RETURN 9FT ADLT (ELECTROSURGICAL) ×2
ELECTRODE REM PT RTRN 9FT ADLT (ELECTROSURGICAL) ×1 IMPLANT
GAUZE 4X4 16PLY RFD (DISPOSABLE) ×2 IMPLANT
GLOVE BIO SURGEON STRL SZ 6.5 (GLOVE) ×2 IMPLANT
GLOVE BIO SURGEON STRL SZ7.5 (GLOVE) IMPLANT
GLOVE BIOGEL PI IND STRL 7.0 (GLOVE) ×2 IMPLANT
GLOVE BIOGEL PI INDICATOR 7.0 (GLOVE) ×2
GOWN STRL REUS W/TWL LRG LVL3 (GOWN DISPOSABLE) ×4 IMPLANT
GOWN STRL REUS W/TWL XL LVL3 (GOWN DISPOSABLE) ×1 IMPLANT
PACK LAPAROSCOPY BASIN (CUSTOM PROCEDURE TRAY) ×2 IMPLANT
PACK TRENDGUARD 450 HYBRID PRO (MISCELLANEOUS) IMPLANT
PAD OB MATERNITY 4.3X12.25 (PERSONAL CARE ITEMS) ×2 IMPLANT
PROTECTOR NERVE ULNAR (MISCELLANEOUS) ×4 IMPLANT
SET IRRIG TUBING LAPAROSCOPIC (IRRIGATION / IRRIGATOR) IMPLANT
SUT MNCRL AB 4-0 PS2 18 (SUTURE) ×2 IMPLANT
SUT VICRYL 0 UR6 27IN ABS (SUTURE) ×2 IMPLANT
TOWEL OR 17X26 10 PK STRL BLUE (TOWEL DISPOSABLE) ×4 IMPLANT
TRENDGUARD 450 HYBRID PRO PACK (MISCELLANEOUS) ×2
TROCAR XCEL BLUNT TIP 100MML (ENDOMECHANICALS) ×2 IMPLANT
TROCAR XCEL NON-BLD 5MMX100MML (ENDOMECHANICALS) ×2 IMPLANT
WARMER LAPAROSCOPE (MISCELLANEOUS) ×2 IMPLANT

## 2019-10-21 NOTE — Transfer of Care (Signed)
Immediate Anesthesia Transfer of Care Note  Patient: Kimberly [name]  Procedure(s) Performed: LAPAROSCOPIC TUBAL LIGATION WITH CAUTERY (Bilateral Abdomen)  Patient Location: PACU  Anesthesia Type:General  Level of Consciousness: awake, alert  and oriented  Airway & Oxygen Therapy: Patient Spontanous Breathing and Patient connected to nasal cannula oxygen  Post-op Assessment: Report given to RN  Post vital signs: Reviewed and stable  Last Vitals:  Vitals Value Taken Time  BP 142/84 10/21/19 1152  Temp    Pulse 109 10/21/19 1154  Resp 16 10/21/19 1154  SpO2 100 % 10/21/19 1154  Vitals shown include unvalidated device data.  Last Pain:  Vitals:   10/21/19 0900  TempSrc: Oral  PainSc: 5       Patients Stated Pain Goal: 5 ([id number])  Complications: No apparent anesthesia complications

## 2019-10-21 NOTE — Discharge Instructions (Addendum)
Laparoscopic Tubal Ligation, Care After This sheet gives you information about how to care for yourself after your procedure. Your health care provider may also give you more specific instructions. If you have problems or questions, contact your health care provider. What can I expect after the procedure? After the procedure, it is common to have:  A sore throat.  Discomfort in your shoulder.  Mild discomfort or cramping in your abdomen.  Gas pains.  Pain or soreness in the area where the surgical incision was made.  A bloated feeling.  Tiredness.  Nausea.  Vomiting. Follow these instructions at home: Medicines  Take over-the-counter and prescription medicines only as told by your health care provider.  Do not take aspirin because it can cause bleeding.  Ask your health care provider if the medicine prescribed to you: ? Requires you to avoid driving or using heavy machinery. ? Can cause constipation. You may need to take actions to prevent or treat constipation, such as:  Drink enough fluid to keep your urine pale yellow.  Take over-the-counter or prescription medicines.  Eat foods that are high in fiber, such as beans, whole grains, and fresh fruits and vegetables.  Limit foods that are high in fat and processed sugars, such as fried or sweet foods. Incision care      Follow instructions from your health care provider about how to take care of your incision. Make sure you: ? Wash your hands with soap and water before and after you change your bandage (dressing). If soap and water are not available, use hand sanitizer. ? Change your dressing as told by your health care provider. ? Leave stitches (sutures), skin glue, or adhesive strips in place. These skin closures may need to stay in place for 2 weeks or longer. If adhesive strip edges start to loosen and curl up, you may trim the loose edges. Do not remove adhesive strips completely unless your health care provider  tells you to do that.  Check your incision area every day for signs of infection. Check for: ? Redness, swelling, or pain. ? Fluid or blood. ? Warmth. ? Pus or a bad smell. Activity  Rest as told by your health care provider.  Avoid sitting for a long time without moving. Get up to take short walks every 1-2 hours. This is important to improve blood flow and breathing. Ask for help if you feel weak or unsteady.  Return to your normal activities as told by your health care provider. Ask your health care provider what activities are safe for you. General instructions  Do not take baths, swim, or use a hot tub until your health care provider approves. Ask your health care provider if you may take showers. You may only be allowed to take sponge baths.  Have someone help you with your daily household tasks for the first few days.  Keep all follow-up visits as told by your health care provider. This is important. Contact a health care provider if:  You have redness, swelling, or pain around your incision.  Your incision feels warm to the touch.  You have pus or a bad smell coming from your incision.  The edges of your incision break open after the sutures have been removed.  Your pain does not improve after 2-3 days.  You have a rash.  You repeatedly become dizzy or light-headed.  Your pain medicine is not helping. Get help right away if you:  Have a fever.  Faint.  Have increasing   pain in your abdomen.  Have severe pain in one or both of your shoulders.  Have fluid or blood coming from your sutures or from your vagina.  Have shortness of breath or difficulty breathing.  Have chest pain or leg pain.  Have ongoing nausea, vomiting, or diarrhea. Summary  After the procedure, it is common to have mild discomfort or cramping in your abdomen.  Take over-the-counter and prescription medicines only as told by your health care provider.  Watch for symptoms that should  prompt you to call your health care provider.  Keep all follow-up visits as told by your health care provider. This is important. This information is not intended to replace advice given to you by your health care provider. Make sure you discuss any questions you have with your health care provider. Document Released: 05/13/2005 Document Revised: 09/18/2018 Document Reviewed: 09/18/2018 Elsevier Patient Education  [place] Instructions  Activity: Get plenty of rest for the remainder of the day. A responsible individual must stay with you for 24 hours following the procedure.  For the next 24 hours, DO NOT: -Drive a car -Paediatric nurse -Drink alcoholic beverages -Take any medication unless instructed by your physician -Make any legal decisions or sign important papers.  Meals: Start with liquid foods such as gelatin or soup. Progress to regular foods as tolerated. Avoid greasy, spicy, heavy foods. If nausea and/or vomiting occur, drink only clear liquids until the nausea and/or vomiting subsides. Call your physician if vomiting continues.  Special Instructions/Symptoms: Your throat may feel dry or sore from the anesthesia or the breathing tube placed in your throat during surgery. If this causes discomfort, gargle with warm salt water. The discomfort should disappear within 24 hours.  If you had a scopolamine patch placed behind your ear for the management of post- operative nausea and/or vomiting:  1. The medication in the patch is effective for 72 hours, after which it should be removed.  Wrap patch in a tissue and discard in the trash. Wash hands thoroughly with soap and water. 2. You may remove the patch earlier than 72 hours if you experience unpleasant side effects which may include dry mouth, dizziness or visual disturbances. 3. Avoid touching the patch. Wash your hands with soap and water after contact with the patch.

## 2019-10-21 NOTE — Anesthesia Preprocedure Evaluation (Signed)
Anesthesia Evaluation    Reviewed: Allergy & Precautions, Patient's Chart, lab work & pertinent test results  History of Anesthesia Complications Negative for: history of anesthetic complications  Airway Mallampati: II  TM Distance: >3 FB Neck ROM: Full    Dental  (+) Teeth Intact   Pulmonary Current SmokerPatient did not abstain from smoking.,    Pulmonary exam normal        Cardiovascular negative cardio ROS Normal cardiovascular exam     Neuro/Psych PSYCHIATRIC DISORDERS Anxiety Depression Admitted Jan 2020 for Guillain Barre vs severe thiamine deficiency; never had respiratory weakness    GI/Hepatic Neg liver ROS, GERD  ,  Endo/Other  negative endocrine ROS  Renal/GU negative Renal ROS  negative genitourinary   Musculoskeletal negative musculoskeletal ROS (+)   Abdominal   Peds  Hematology negative hematology ROS (+)   Anesthesia Other Findings   Reproductive/Obstetrics                            Anesthesia Physical Anesthesia Plan  ASA: II  Anesthesia Plan: General   Post-op Pain Management:    Induction: Intravenous  PONV Risk Score and Plan: 3 and Ondansetron, Dexamethasone, Treatment may vary due to age or medical condition and Midazolam  Airway Management Planned: Oral ETT  Additional Equipment: None  Intra-op Plan:   Post-operative Plan: Extubation in OR  Informed Consent: I have reviewed the patients History and Physical, chart, labs and discussed the procedure including the risks, benefits and alternatives for the proposed anesthesia with the patient or authorized representative who has indicated his/her understanding and acceptance.     Dental advisory given  Plan Discussed with:   Anesthesia Plan Comments:       Anesthesia Quick Evaluation

## 2019-10-21 NOTE — Anesthesia Postprocedure Evaluation (Signed)
Anesthesia Post Note  Patient: [name]  Procedure(s) Performed: LAPAROSCOPIC TUBAL LIGATION WITH CAUTERY (Bilateral Abdomen)     Patient location during evaluation: PACU Anesthesia Type: General Level of consciousness: awake and alert Pain management: pain level controlled Vital Signs Assessment: post-procedure vital signs reviewed and stable Respiratory status: spontaneous breathing, nonlabored ventilation and respiratory function stable Cardiovascular status: blood pressure returned to baseline and stable Postop Assessment: no apparent nausea or vomiting Anesthetic complications: no    Last Vitals:  Vitals:   10/21/19 1230 10/21/19 1315  BP: 126/88 125/85  Pulse: 87 (!) 102  Resp: 19 18  Temp:  37.6 C  SpO2: 96% 95%    Last Pain:  Vitals:   10/21/19 1328  TempSrc:   PainSc: 3                  Lidia Collum

## 2019-10-21 NOTE — Anesthesia Procedure Notes (Signed)
Procedure Name: Intubation Date/Time: 10/21/2019 10:51 AM Performed by: Bonney Aid, CRNA Pre-anesthesia Checklist: Patient identified, Emergency Drugs available, Suction available and Patient being monitored Patient Re-evaluated:Patient Re-evaluated prior to induction Oxygen Delivery Method: Circle system utilized Preoxygenation: Pre-oxygenation with 100% oxygen Induction Type: IV induction Ventilation: Mask ventilation without difficulty Laryngoscope Size: Mac and 3 Grade View: Grade I Tube type: Oral Number of attempts: 1 Airway Equipment and Method: Stylet Placement Confirmation: ETT inserted through vocal cords under direct vision,  positive ETCO2 and breath sounds checked- equal and bilateral Tube secured with: Tape Dental Injury: Teeth and Oropharynx as per pre-operative assessment  Comments: Rapid desaturation on induction, easily ventilated and improved.

## 2019-10-21 NOTE — Op Note (Signed)
Operative Note  10/21/2019  11:45 AM  PATIENT:  Kimberly [name]  33 y.o. female  PRE-OPERATIVE DIAGNOSIS:   Desire for sterilization  POST-OPERATIVE DIAGNOSIS:   Desire for sterilization  PROCEDURE:  Procedure(s): LAPAROSCOPIC BILATERAL TUBAL LIGATION WITH CAUTERY  SURGEON:  Surgeon(s): Princess Bruins, MD  ANESTHESIA:   general  FINDINGS: Normal uterus, bilateral tubes and bilateral ovaries.    DESCRIPTION OF OPERATION: Under general anesthesia with laryngeal mask the patient is in lithotomy position.  She is prepped with DuraPrep on the abdomen, Betadine on the suprapubic, vulvar and vaginal areas.  She is draped as usual.  The bladder is catheterized.  Timeout was done.  The vaginal exam reveals an anteverted uterus, normal volume, mobile, no adnexal mass.  The speculum is inserted in the vagina and the anterior lip of the cervix is grasped with a tenaculum.  The uterine cannula is put in place and the tenaculum was removed.  The speculum is removed.  We go to the abdomen.  The infraumbilical area is infiltrated with Marcaine one quarter plain a total of 6 cc.  A 1.5 cm incision is done with the scalpel.  The aponeurosis is grasped with Coker's.  The aponeurosis is opened under direct vision with Mayo scissors.  The parietal peritoneum is opened bluntly with the finger.  A pursestring stitch is done on the aponeurosis with Vicryl 0.  The Sheryle Hail is inserted under direct vision.  Pneumoperitoneum is created with CO2.  The camera on the operative scope is inserted at that port.  Visualization of the pelvic cavity reveals a normal uterus in size and appearance.  Both tubes are normal.  Both ovaries are normal with a small functional appearing cyst on the left ovary.  A finding lesion is present between the bowel and left lower abdominal wall.  Pictures are taken.  The bipolar is inserted in the operative port.  The right tube is cauterized at about 2 cm of the cornua and all its width  including the mesosalpinx.  We cauterized slightly distal to that point and then in between.  Pictures were taken after cauterization.  The distal tube was well identified.  On the left side we cauterized the tube at about 2 cm of the cornua, the whole width was cauterized including the mesosalpinx.  We cauterized slightly distal to that and then in between.  Pictures were taken after cauterization.  The tube was well identified although the distal tube was not visible because of the bowel adhesions.  Pictures were taken.  Good hemostasis at all levels.  The laparoscopic instrument was removed.  The port was removed and the CO2 was evacuated.  The pursestring stitch was attached.  We then put one simple stitch of Monocryl 4-0 on the adipose tissue.  We closed the skin with separate stitches of Monocryl 4-0.  Dermabond was added.  A honeycomb dressing was put on the incision.  Hemostasis was adequate.  The uterine cannula was removed from the vagina.  The patient was brought to recovery room in good and stable status.  ESTIMATED BLOOD LOSS: 5 mL   Intake/Output Summary (Last 24 hours) at 10/21/2019 1145 Last data filed at 10/21/2019 1144 Gross per 24 hour  Intake --  Output 35 ml  Net -35 ml     BLOOD ADMINISTERED:none   LOCAL MEDICATIONS USED:  MARCAINE     SPECIMEN:  Source of Specimen:  None  DISPOSITION OF SPECIMEN:  N/A  COUNTS:  YES  PLAN OF  CARE: Transfer to PACU  Marie-Lyne LavoieMD11:45 AM

## 2019-10-21 NOTE — H&P (Signed)
Kimberly [name] is an 33 y.o. female.[id number] Married.  Twin sons are 4 yo.  RP: Desire for Tubal Sterilization  HPI: Certain of her decision to proceed with a Tubal Sterilization.  Twin sons are healthy and husband has 5 older children from a previous marriage.  Had twins by C/S, no Cx.  Well on BCPs.  No pelvic pain.  STI screen/Pap negative 06/2019.  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  Menstrual History: Patient's last menstrual period was 10/15/2019 (exact date).    Past Medical History:  Diagnosis Date  . Abnormal Pap smear   . ADD (attention deficit disorder)   . Anxiety   . Family history of anesthesia complication    mother has difficult  time going to sleep  . Guillain Barr syndrome ([place])   . Headache(784.0)   . HSV-1 infection   . IBS (irritable bowel syndrome)   . Knee hyperextension injury 2020   Left, being treated by ortho  . Tachycardia     Past Surgical History:  Procedure Laterality Date  . BREAST BIOPSY Right   . BREAST SURGERY     Biopsy-benign  . CESAREAN SECTION  09/13/2012   Procedure: CESAREAN SECTION;  Surgeon: Lavonia Drafts, MD;  Location: [facility];  Service: Obstetrics;  Laterality: N/A;  . cortisone shot R wrist Right 2020  . KYPHOPLASTY N/A 08/27/2013   Procedure: Thoracic Twelve Kyphoplasty;  Surgeon: Erline Levine, MD;  Location: [facility];  Service: Neurosurgery;  Laterality: N/A;  T12 Kyphoplasty  . WISDOM TOOTH EXTRACTION     x4  . WRIST SURGERY Right 07-23-2013    Family History  Problem Relation Age of Onset  . Depression Mother        chronic  . Hearing loss Mother   . Hyperlipidemia Mother   . Vision loss Mother        beachets disease  . COPD Father   . Diabetes Father   . Cancer Maternal Grandmother 63       breast cancer  . Parkinsonism Maternal Grandmother   . Heart disease Maternal Grandmother   . Breast cancer Maternal Grandmother   . Alzheimer's disease Paternal  Grandmother   . Heart disease Paternal Grandfather        Died age 35  . Nephrolithiasis Paternal Grandfather   . Heart disease Maternal Grandfather   . Cancer Other 60       breast  . Breast cancer Other     Social History:  reports that she has been smoking cigarettes. She has a 5.00 pack-year smoking history. She has never used smokeless tobacco. She reports current alcohol use. She reports that she does not use drugs.  Allergies:  Allergies  Allergen Reactions  . Tape Itching and Other (See Comments)    Clear, "plastic" tape is the culprit    Medications Prior to Admission  Medication Sig Dispense Refill Last Dose  . cephALEXin (KEFLEX) 500 MG capsule Take 500 mg by mouth 4 (four) times daily.   10/21/2019 at 0500  . DULoxetine (CYMBALTA) 30 MG capsule Take 1 capsule (30 mg total) by mouth daily. 30 capsule 5 Past Month at Unknown time  . ibuprofen (ADVIL,MOTRIN) 200 MG tablet Take 800 mg by mouth every 6 (six) hours as needed for mild pain or moderate pain.    10/20/2019 at Unknown time  . norethindrone (ORTHO MICRONOR) 0.35 MG tablet Take 1 tablet (0.35 mg total) by mouth daily. 3 Package 4  10/20/2019 at Unknown time  . pantoprazole (PROTONIX) 40 MG tablet TAKE 1 TABLET BY MOUTH DAILY. TAKE 30-60 MINUTES BEFORE BREAKFAST 90 tablet 0 10/21/2019 at 0500  . pregabalin (LYRICA) 200 MG capsule Take 1 capsule (200 mg total) by mouth 3 (three) times daily. 270 capsule 1 10/21/2019 at 0500  . Prenatal Vit-Fe Fumarate-FA (PRENATAL VITAMIN) 27-0.8 MG TABS Take 1 tablet by mouth daily. (Patient taking differently: Take 1 tablet by mouth daily. ) 90 tablet 6 10/20/2019 at Unknown time  . thiamine (VITAMIN B-1) 100 MG tablet Take 1 tablet (100 mg total) by mouth daily. 30 tablet 0 10/20/2019 at Unknown time  . calcium carbonate (TUMS EX) 750 MG chewable tablet Chew 1-2 tablets by mouth as needed (for heartburn or indigestion).   More than a month at Unknown time  . loperamide (IMODIUM) 2 MG  capsule Take 2 mg by mouth as needed for diarrhea or loose stools.    More than a month at Unknown time  . ondansetron (ZOFRAN) 4 MG tablet Take every 4-6 hours as needed. 30 tablet 1 Unknown at Unknown time  . polyethylene glycol (MIRALAX / GLYCOLAX) packet Take 17 g by mouth daily as needed for mild constipation.    More than a month at Unknown time  . traMADol (ULTRAM) 50 MG tablet Take 50 mg by mouth every 4 (four) hours as needed.   More than a month at Unknown time    REVIEW OF SYSTEMS: A ROS was performed and pertinent positives and negatives are included in the history.  GENERAL: No fevers or chills. HEENT: No change in vision, no earache, sore throat or sinus congestion. NECK: No pain or stiffness. CARDIOVASCULAR: No chest pain or pressure. No palpitations. PULMONARY: No shortness of breath, cough or wheeze. GASTROINTESTINAL: No abdominal pain, nausea, vomiting or diarrhea, melena or bright red blood per rectum. GENITOURINARY: No urinary frequency, urgency, hesitancy or dysuria. MUSCULOSKELETAL: No joint or muscle pain, no back pain, no recent trauma. DERMATOLOGIC: No rash, no itching, no lesions. ENDOCRINE: No polyuria, polydipsia, no heat or cold intolerance. No recent change in weight. HEMATOLOGICAL: No anemia or easy bruising or bleeding. NEUROLOGIC: No headache, seizures, numbness, tingling or weakness. PSYCHIATRIC: No depression, no loss of interest in normal activity or change in sleep pattern.     Blood pressure (!) 144/94, pulse 95, temperature 98.6 F (37 C), temperature source Oral, resp. rate 18, height 5' 7.5" (1.715 m), weight 86.4 kg, last menstrual period 10/15/2019, SpO2 99 %.  Physical Exam:  See office notes   Results for orders placed or performed during the hospital encounter of 10/21/19 (from the past 24 hour(s))  Pregnancy, urine POC     Status: None   Collection Time: 10/21/19  8:42 AM  Result Value Ref Range   Preg Test, Ur NEGATIVE NEGATIVE   Covid test  negative   Assessment/Plan:  33 y.o. G1P0102   1. Encounter for tubal ligation counseling Desire for bilateral tubal sterilization.  Certain of wanting a definitive method to prevent pregnancies.  Currently on birth control pills.  Decision to proceed with a laparoscopic bilateral tubal sterilization with cauterization.  Preop precautions reviewed including fasting for at least 6 hours.  Will only take Lyrica with a small sip of water the morning of surgery.  Surgical procedure with risks and benefits reviewed, including risks of anesthesia, risk of trauma to the internal organs, risks of infection, risks of DVT/pulmonary embolism.  Postop management reviewed. Patient will use ibuprofen 600 mg  p.o. every 6 hours as needed.  Percocet will be prescribed if ibuprofen is not sufficient especially in the first 24 hours.  Will avoid heavy lifting and excess abdominal pressure for 1 week after surgery.  No intercourse for 2 weeks after surgery.                        Patient was counseled as to the risk of surgery to include the following:  1. Infection (prohylactic antibiotics will be administered)  2. DVT/Pulmonary Embolism (prophylactic pneumo compression stockings will be used)  3.Trauma to internal organs requiring additional surgical procedure to repair any injury to internal organs requiring perhaps additional hospitalization days.  4.Hemmorhage requiring transfusion and blood products which carry risks such as anaphylactic reaction, hepatitis and AIDS  Patient had received literature information on the procedure scheduled and all her questions were answered and fully accepts all risk.  Marie-Lyne Jari Carollo 10/21/2019, 9:28 AM

## 2019-10-23 ENCOUNTER — Telehealth: Payer: Self-pay | Admitting: *Deleted

## 2019-10-23 ENCOUNTER — Other Ambulatory Visit: Payer: Self-pay

## 2019-10-23 NOTE — Telephone Encounter (Signed)
Patient informed, told me she will call back to schedule has an appointment scheduled today at 3:00pm

## 2019-10-23 NOTE — Telephone Encounter (Signed)
Bring in for evaluation

## 2019-10-23 NOTE — Telephone Encounter (Signed)
Patient called post tubal ligation on 10/21/19, called c/o running low grade fever since being discharge from hospital highest temperature taken was 99.8. C/O nausea, vomiting x 1 episode this am as well, following several episodes of diarrhea at least 4-5 times this am, has sore throat, Surgery incision looks well, stomach is very sore due to the vomiting. Only took ibuprofen for pain, has not taking any Percocet.  Patient would like recommendations. Please advise

## 2019-10-24 ENCOUNTER — Ambulatory Visit (INDEPENDENT_AMBULATORY_CARE_PROVIDER_SITE_OTHER): Payer: BC Managed Care – PPO | Admitting: Obstetrics & Gynecology

## 2019-10-24 ENCOUNTER — Other Ambulatory Visit: Payer: Self-pay

## 2019-10-24 ENCOUNTER — Encounter: Payer: Self-pay | Admitting: *Deleted

## 2019-10-24 VITALS — BP 128/84 | Temp 98.0°F

## 2019-10-24 DIAGNOSIS — Z09 Encounter for follow-up examination after completed treatment for conditions other than malignant neoplasm: Secondary | ICD-10-CM

## 2019-10-24 DIAGNOSIS — R509 Fever, unspecified: Secondary | ICD-10-CM

## 2019-10-24 DIAGNOSIS — R102 Pelvic and perineal pain: Secondary | ICD-10-CM | POA: Diagnosis not present

## 2019-10-24 DIAGNOSIS — R197 Diarrhea, unspecified: Secondary | ICD-10-CM

## 2019-10-24 LAB — CBC WITH DIFFERENTIAL/PLATELET
Absolute Monocytes: 742 cells/uL (ref 200–950)
Basophils Absolute: 64 cells/uL (ref 0–200)
Basophils Relative: 0.5 %
Eosinophils Absolute: 115 cells/uL (ref 15–500)
Eosinophils Relative: 0.9 %
HCT: 39.5 % (ref 35.0–45.0)
Hemoglobin: 13.2 g/dL (ref 11.7–15.5)
Lymphs Abs: 2573 cells/uL (ref 850–3900)
MCH: 32.7 pg (ref 27.0–33.0)
MCHC: 33.4 g/dL (ref 32.0–36.0)
MCV: 97.8 fL (ref 80.0–100.0)
MPV: 12 fL (ref 7.5–12.5)
Monocytes Relative: 5.8 %
Neutro Abs: 9306 cells/uL — ABNORMAL HIGH (ref 1500–7800)
Neutrophils Relative %: 72.7 %
Platelets: 254 10*3/uL (ref 140–400)
RBC: 4.04 10*6/uL (ref 3.80–5.10)
RDW: 12.1 % (ref 11.0–15.0)
Total Lymphocyte: 20.1 %
WBC: 12.8 10*3/uL — ABNORMAL HIGH (ref 3.8–10.8)

## 2019-10-24 MED ORDER — AMOXICILLIN-POT CLAVULANATE 875-125 MG PO TABS: 1.0000 | ORAL_TABLET | Freq: Two times a day (BID) | ORAL | 0 refills | Status: AC

## 2019-10-24 NOTE — Patient Instructions (Signed)
1. Status post gynecological surgery, follow-up exam Temperature completely normal here today at 67 F.  No acute abdomen.  No evidence of surgical complication, but given the subfebrile state at home and the pelvic pain, decision to proceed with a CBC with differential and start on Augmentin 1 tablet twice a day for 7 days to cover for any possible early postop infection.  Usage reviewed and prescription sent to pharmacy.  2. Pelvic pain in female As above.  Urine analysis appears contaminated by menstrual blood, but otherwise no evidence of acute cystitis.  Will wait on urine culture. - CBC w/Diff - Urinalysis,Complete w/RFL Culture  Other orders - amoxicillin-clavulanate (AUGMENTIN) 875-125 MG tablet; Take 1 tablet by mouth 2 (two) times daily for 7 days.  Philis, it was a pleasure seeing you today!  I will inform you of your results as soon as they are available.

## 2019-10-24 NOTE — Progress Notes (Signed)
    Kimberly [name] 1985/12/20 [id number]        33 y.o.  G1P0102 Married  RP:  Subfebrile with pelvic pain and diarrhea on day #3 post LPS BT/S by cauterization  HPI: Patient had frequent watery stools yesterday with no blood.  Had 2 loose stools today so far.  Temperature yesterday and this morning at 99 F.  Today feels better, less bloated than yesterday.  Having a menstrual period, as she stopped her birth control pills 3 days ago, at the time of surgery.  No urinary tract infection symptoms.    OB History  Gravida Para Term Preterm AB Living  1 1 0 1 0 2  SAB TAB Ectopic Multiple Live Births  0 0 0 1 2    # Outcome Date GA Lbr Len/2nd Weight Sex Delivery Anes PTL Lv  1A Preterm 09/13/12 [redacted]w[redacted]d  5 lb 4.3 oz (2.39 kg) M CS-LTranv Spinal  LIV  1B Preterm 09/13/12 [redacted]w[redacted]d  3 lb 6.7 oz (1.551 kg) M CS-LVertical Spinal  LIV    Past medical history,surgical history, problem list, medications, allergies, family history and social history were all reviewed and documented in the EPIC chart.   Directed ROS with pertinent positives and negatives documented in the history of present illness/assessment and plan.  Exam:  Vitals:   10/24/19 1135  BP: 128/84  Temp: 98 F (36.7 C)   General appearance:  Normal  Abdomen: Soft, not distended, mildly tender at lower abdomen.  Bowel sounds present.  Infraumbilical incision intact.  Gynecologic exam: Vulva normal.  Bimanual exam: Uterus AV, normal size, mobile, NT.  No adnexal mass, NT bilaterally.  Mild menstrual flow.  U/A: Brown, cloudy, protein 1+, nitrites negative.  White blood cells 0-5, red blood cells more than 60 (menstruating), bacteria few.  Pending urine culture.   Assessment/Plan:  33 y.o. G1P0102   1. Status post gynecological surgery, follow-up exam Temperature completely normal here today at 28 F.  No acute abdomen.  No evidence of surgical complication, but given the subfebrile state at home and the pelvic pain,  decision to proceed with a CBC with differential and start on Augmentin 1 tablet twice a day for 7 days to cover for any possible early postop infection.  Usage reviewed and prescription sent to pharmacy.  2. Pelvic pain in female As above.  Urine analysis appears contaminated by menstrual blood, but otherwise no evidence of acute cystitis.  Will wait on urine culture. - CBC w/Diff - Urinalysis,Complete w/RFL Culture  Other orders - amoxicillin-clavulanate (AUGMENTIN) 875-125 MG tablet; Take 1 tablet by mouth 2 (two) times daily for 7 days.  Counseling on above issues and coordination of care more than 50% for 25 minutes.  Princess Bruins MD, 11:52 AM 10/24/2019

## 2019-10-25 DIAGNOSIS — Z681 Body mass index (BMI) 19 or less, adult: Secondary | ICD-10-CM | POA: Diagnosis not present

## 2019-10-25 DIAGNOSIS — B349 Viral infection, unspecified: Secondary | ICD-10-CM | POA: Diagnosis not present

## 2019-10-25 DIAGNOSIS — U071 COVID-19: Secondary | ICD-10-CM | POA: Diagnosis not present

## 2019-10-26 LAB — URINE CULTURE
MICRO NUMBER:: 1208313
SPECIMEN QUALITY:: ADEQUATE

## 2019-10-26 LAB — URINALYSIS, COMPLETE W/RFL CULTURE
Bilirubin Urine: NEGATIVE
Glucose, UA: NEGATIVE
Hyaline Cast: NONE SEEN /LPF
Ketones, ur: NEGATIVE
Leukocyte Esterase: NEGATIVE
Nitrites, Initial: NEGATIVE
RBC / HPF: >=60 /HPF — AB (ref 0–2)
Specific Gravity, Urine: 1.025 (ref 1.001–1.03)
pH: 6 (ref 5.0–8.0)

## 2019-10-26 LAB — CULTURE INDICATED

## 2019-10-28 ENCOUNTER — Other Ambulatory Visit: Payer: Self-pay | Admitting: Neurology

## 2019-11-11 ENCOUNTER — Other Ambulatory Visit: Payer: Self-pay | Admitting: Gastroenterology

## 2019-11-15 ENCOUNTER — Ambulatory Visit: Payer: BC Managed Care – PPO | Admitting: Obstetrics & Gynecology

## 2019-11-15 DIAGNOSIS — Z0289 Encounter for other administrative examinations: Secondary | ICD-10-CM

## 2019-12-05 ENCOUNTER — Telehealth: Payer: Self-pay | Admitting: Family Medicine

## 2019-12-05 NOTE — Telephone Encounter (Signed)
I was unaware that patient had discontinued Lyrica.  We need to find out how long she has been off of Lyrica.  She was advised to continue 200 mg 3 times daily at her last appointment with me.  If she has been off of Lyrica we will need to start a lower dose and titrate up.

## 2019-12-05 NOTE — Telephone Encounter (Signed)
Patient called in and stated she only took Cymbalta for 2 weeks and she was unable to tolerate and she wants to know if she can start back on Lyrica

## 2019-12-09 MED ORDER — PREGABALIN 200 MG PO CAPS: 200.0000 mg | ORAL_CAPSULE | Freq: Three times a day (TID) | ORAL | 1 refills | Status: DC

## 2019-12-09 NOTE — Telephone Encounter (Signed)
I called pt and she did stop the lyrica.  She has been off for about 2 wks.  She tookt he cymbalta for 2 wks, taking daytime, then switching to nighttime to see if would make difference.  She said it would make her too sleepy, like in fog.  She has 2 36y/o twin boys and she needs to be on top of this.  Please advise regarding if need to titrate back up to  lyrica 200mg  TId.

## 2019-12-09 NOTE — Addendum Note (Signed)
Addended by: Marcial Pacas on: 12/09/2019 04:46 PM   Modules accepted: Orders

## 2019-12-09 NOTE — Telephone Encounter (Signed)
She may resume dose if only off for 2 weeks. TY.

## 2019-12-09 NOTE — Telephone Encounter (Signed)
Pt has called and was made aware of the last update from Amy, NP re: her Lyrica.  Pt states she is out and would [id number]

## 2019-12-09 NOTE — Addendum Note (Signed)
Addended by: Brandon Melnick on: 12/09/2019 03:22 PM   Modules accepted: Orders

## 2019-12-10 MED ORDER — PREGABALIN 200 MG PO CAPS: 200.0000 mg | ORAL_CAPSULE | Freq: Three times a day (TID) | ORAL | 1 refills | Status: DC

## 2019-12-10 NOTE — Addendum Note (Signed)
Addended by: Brandon Melnick on: 12/10/2019 12:51 PM   Modules accepted: Orders

## 2019-12-10 NOTE — Telephone Encounter (Signed)
Pt called stating that she would like to know if her pregabalin (LYRICA) 200 MG capsule can be sent in to the [facility] in [place] on [address] because if she get's it there it will cost her between $31-$33 with the [facility] Card. They are [facility] to [facility] [address]. Please advise.

## 2019-12-10 NOTE — Telephone Encounter (Signed)
I called and LMVM for [facility] [address] to cancel the lyrica exribed yesterday as pt getting somewhere else.  Placed order for lyrica to [facility] [place].

## 2019-12-10 NOTE — Addendum Note (Signed)
Addended by: Debbora Presto L on: 12/10/2019 04:27 PM   Modules accepted: Orders

## 2019-12-23 DIAGNOSIS — M25561 Pain in right knee: Secondary | ICD-10-CM | POA: Insufficient documentation

## 2019-12-23 DIAGNOSIS — S8001XA Contusion of right knee, initial encounter: Secondary | ICD-10-CM | POA: Insufficient documentation

## 2020-01-30 ENCOUNTER — Ambulatory Visit: Payer: Self-pay | Attending: Internal Medicine

## 2020-01-30 DIAGNOSIS — Z23 Encounter for immunization: Secondary | ICD-10-CM

## 2020-01-30 NOTE — Progress Notes (Signed)
   Covid-19 Vaccination Clinic  Name:  Kimberly [name]    MRN: [id number] DOB: 26-Aug-1986  01/30/2020  [name] was observed post Covid-19 immunization for 15 minutes without incident. She was provided with Vaccine Information Sheet and instruction to access the V-Safe system.   [name] was instructed to call 911 with any severe reactions post vaccine: Marland Kitchen Difficulty breathing  . Swelling of face and throat  . A fast heartbeat  . A bad rash all over body  . Dizziness and weakness   Immunizations Administered    Name Date Dose VIS Date Route   Pfizer COVID-19 Vaccine 01/30/2020  2:43 PM 0.3 mL 10/18/2019 Intramuscular   Manufacturer: [place]   Lot: [id number]   [place]: [id number]

## 2020-02-25 ENCOUNTER — Ambulatory Visit: Payer: Self-pay | Attending: Internal Medicine

## 2020-02-25 DIAGNOSIS — Z23 Encounter for immunization: Secondary | ICD-10-CM

## 2020-02-25 NOTE — Progress Notes (Signed)
   Covid-19 Vaccination Clinic  Name:  Kimberly [name]    MRN: [id number] DOB: [birth year]  02/25/2020  Kimberly [name] was observed post Covid-19 immunization for 15 minutes without incident. She was provided with Vaccine Information Sheet and instruction to access the V-Safe system.   Kimberly [name] was instructed to call 911 with any severe reactions post vaccine: Marland Kitchen Difficulty breathing  . Swelling of face and throat  . A fast heartbeat  . A bad rash all over body  . Dizziness and weakness   Immunizations Administered    Name Date Dose VIS Date Route   Pfizer COVID-19 Vaccine 02/25/2020  2:23 PM 0.3 mL 01/01/2019 Intramuscular   Manufacturer: [place]   Lot: [id number]   [place]: [id number]

## 2020-03-25 ENCOUNTER — Ambulatory Visit: Payer: PRIVATE HEALTH INSURANCE | Admitting: Family Medicine

## 2020-03-25 ENCOUNTER — Encounter: Payer: Self-pay | Admitting: Family Medicine

## 2020-03-25 ENCOUNTER — Other Ambulatory Visit: Payer: Self-pay

## 2020-03-25 VITALS — BP 133/87 | HR 115 | Ht 67.0 in | Wt 188.0 lb

## 2020-03-25 DIAGNOSIS — G629 Polyneuropathy, unspecified: Secondary | ICD-10-CM

## 2020-03-25 DIAGNOSIS — Z8669 Personal history of other diseases of the nervous system and sense organs: Secondary | ICD-10-CM | POA: Diagnosis not present

## 2020-03-25 MED ORDER — OXCARBAZEPINE 150 MG PO TABS: 150.0000 mg | ORAL_TABLET | Freq: Two times a day (BID) | ORAL | 11 refills | Status: DC

## 2020-03-25 NOTE — Progress Notes (Addendum)
PATIENT: Kimberly [name] DOB: May 13, 1986  REASON FOR VISIT: follow up HISTORY FROM: patient  Chief Complaint  Patient presents with  . Follow-up    rm 8, alone, pt states no improvements      HISTORY OF PRESENT ILLNESS: Today 03/25/20 Kimberly GOVERT is a 34 y.o. female here today for follow up. She continues to have burning pain, tingling and numbness or both shins and feet. Fingers are still numb and tingly. She does not feel there is any improvement overall, but does feel that some days are better than others. No weakness. She has had a couple of mechanical falls but denies injuries. She continues Lyrica 200mg  TID. She tried duloxetine but states that she did not like how she felt on it. She felt disconnected and sleepy. She did get the compounded neuropathy cream that she feels helps temporarily. CBD oil helps too. No vision changes, no double vision. She did get an eye exam at [facility] and was told that that she needed new glasses but otherwise everything looked ok.    HISTORY: (copied from my note on 09/25/2019)  Kimberly [name] is a 34 y.o. female here today for follow up post GB diagnosis in 11/2018. She was advised to increase Lyrica to 200mg  TID at last visit in 03/2019. If she misses a dose she definitely notes worsening but continues to have significant tingling and sharp pains in bilateral feet and calves. She also notes continues numbness of bilateral hands (mostly thumb and first finger bilaterally). She will take Tramadol 50mg  as needed that she feels helps significantly. She has not tried duloxetine in the past. No other antidepressants. Weakness has improved significantly. She feels that she is clumsy. She reports multiple falls. She did very well with PT in March but does not feel she has time to work with PT right now. She broke left ankle in 05/2019. She rolled her ankle while walking. She initially felt pain but then states pain resolved. Swelling continued which  prompted orthopedic visit. She reports being in a cast for several weeks.  She has gained 30 pounds this year. She is very active with her twins (36 year old). No vision changes or returning double vision. She did not see Kimberly [name]. She states that their office didn't call her. She reports drinking "every now and then." She last drank alcohol at her brother's wedding this past weekend. Drink of choice is liquor.    HISTORY: (copied from Kimberly [name] note on 03/25/2019)  03/25/2019: She can scratch her calf and feel her toe, strange sensory perceptions. Some days she has some feeling and then it goes away. Discussed this could be the nerves regenerating. She still has some nerve pain but improving, but she has pain in the arches of her feet and keeps cotton balls between her toes because it hurts for them to rub together. lyrica helps, will split it tid for better coverage.She feels great, she was hiking in the woods and swimming with her kids, she had a knee injury and the sensory changes bother her but feels doing well. Will change lyrice to 200mg  tid for better coverage. F/u 3 months  Interval history 02/12/2019: Patient with a prior history of opioid abuse, GBS recently this year admitted for IVIG then discharged, admitted again for possible thiamine deficiency. She is feeling better. Overall she is improving. She is still numb from below her breast, her legs are numb and lower legs and feet and nerve pain.She can't  feel her toes and fingers. She graduated from PT. She has 36 year old twin boys. She has started riding a bike. She has been losing hair. This is not my specialty, I'm not aware Lyrica can do this but unsure, may be stress, advised to discuss with pcp because this is not my area of expertise. She has bald spots, recommended pcp and possibly derm. She has double vision, started in the hospital the first time but worsening. She will see objects side by side, worse up closer, closing left eye  makes it go away. Her family thinks she is drunk a lot due to her walking and she sounds and looks intoxicated. These symptoms have been stable for 4 weeks.She has not been taking her B1 supplement because it was too high and then it was normal. Speech Therapy.   Interval History: She is doing well. She was discharged from the hospital about 6 days ago. She was readmitted for concerns of thiamin deficiency. She is slowly regaining feeling. She can feel in bilateral feet and hands. She can feel pressure most everywhere else with exception of her abdomen. She continues thiamin, gabapentin and Lyrica. She does continue to have numbness, tingling and shooting pains. She is working with PT. She has finished therapy with OT. She is requesting a handicap placard and refill of her oxycodone-acetaminophen. She continues this q6hr for pain.   [id number] A Bevilleis a 34 y.o.femalehere as requested by Kimberly. Unknown Kimberly [name] numbness. PMHx depression. Swelling in the feet and legs for several months, placed on diuretic around the end of the year, she became dehydrated. The pain started when the swelling reduced. She was significantly swollen, she "didn't have ankles", then they went numb in the area of the swelling. But then it started moving up the legs 5 days ago and now is up to her chest and her left shoulder and arm is tingly but she is numb. Now her mouth is numb. She has "serious weakness", she can barely walk or hold onto herself a lot of times. When she is walking she feels like her legs are going to come out from beneath her. She has not fallen. Still progressing. She is tight when she lays down flat but she cant feel anything from the neck down.   Reviewed notes, labs and imaging from outside physicians, which showed:  Patient presented on 11/06/2018 with bilateral leg pain and swelling of the ankles and feet. Reviewed Kimberly. Delanna Ahmadi notes. She was put on Lasix and had hypokalemia. She was prescribed  potassium. Went to ED and diagnosed with dehydration. Urine culture positive for E. coli but patient denied treatment was told UA in hospital normal. Labs also showed elevated MCV of 106. Prior history of alcoholism treated 2 years ago and denies current use. Believes she was tested for B12 and folate no lab results in chart TSH was normal in July 2019. Still complaining of nausea and vomiting.TSH 0.78. B12, folate was drawn no results yet.     REVIEW OF SYSTEMS: Out of a complete 14 system review of symptoms, the patient complains only of the following symptoms, neuropathy and all other reviewed systems are negative.   ALLERGIES: Allergies  Allergen Reactions  . Tape Itching and Other (See Comments)    Clear, "plastic" tape is the culprit    HOME MEDICATIONS: Outpatient Medications Prior to Visit  Medication Sig Dispense Refill  . calcium carbonate (TUMS EX) 750 MG chewable tablet Chew 1-2 tablets by mouth as needed (for heartburn  or indigestion).    Marland Kitchen ibuprofen (ADVIL,MOTRIN) 200 MG tablet Take 800 mg by mouth every 6 (six) hours as needed for mild pain or moderate pain.     Marland Kitchen loperamide (IMODIUM) 2 MG capsule Take 2 mg by mouth as needed for diarrhea or loose stools.     . ondansetron (ZOFRAN) 4 MG tablet Take every 4-6 hours as needed. 30 tablet 1  . pantoprazole (PROTONIX) 40 MG tablet TAKE 1 TABLET BY MOUTH DAILY. TAKE 30-60 MINUTES BEFORE BREAKFAST 90 tablet 0  . pregabalin (LYRICA) 200 MG capsule Take 1 capsule (200 mg total) by mouth 3 (three) times daily. 270 capsule 1  . thiamine (VITAMIN B-1) 100 MG tablet Take 1 tablet (100 mg total) by mouth daily. 30 tablet 0  . cephALEXin (KEFLEX) 500 MG capsule Take 500 mg by mouth 4 (four) times daily.    . DULoxetine (CYMBALTA) 30 MG capsule Take 1 capsule (30 mg total) by mouth daily. 30 capsule 5  . oxyCODONE-acetaminophen (PERCOCET) 7.5-325 MG tablet Take 1 tablet by mouth every 6 (six) hours as needed for severe pain. (Patient  not taking: Reported on 10/24/2019) 20 tablet 0  . polyethylene glycol (MIRALAX / GLYCOLAX) packet Take 17 g by mouth daily as needed for mild constipation.     . Prenatal Vit-Fe Fumarate-FA (PRENATAL VITAMIN) 27-0.8 MG TABS Take 1 tablet by mouth daily. (Patient taking differently: Take 1 tablet by mouth daily. ) 90 tablet 6  . traMADol (ULTRAM) 50 MG tablet Take 50 mg by mouth every 4 (four) hours as needed.     No facility-administered medications prior to visit.    PAST MEDICAL HISTORY: Past Medical History:  Diagnosis Date  . Abnormal Pap smear   . ADD (attention deficit disorder)   . Anxiety   . Guillain Barr syndrome ([place])   . Headache(784.0)   . HSV-1 infection   . IBS (irritable bowel syndrome)   . Knee hyperextension injury 2020   Left, being treated by ortho  . Tachycardia     PAST SURGICAL HISTORY: Past Surgical History:  Procedure Laterality Date  . BREAST BIOPSY Right   . BREAST SURGERY     Biopsy-benign  . CESAREAN SECTION  09/13/2012   Procedure: CESAREAN SECTION;  Surgeon: Lavonia Drafts, MD;  Location: [facility];  Service: Obstetrics;  Laterality: N/A;  . cortisone shot R wrist Right 2020  . KYPHOPLASTY N/A 08/27/2013   Procedure: Thoracic Twelve Kyphoplasty;  Surgeon: Erline Levine, MD;  Location: [facility];  Service: Neurosurgery;  Laterality: N/A;  T12 Kyphoplasty  . LAPAROSCOPIC TUBAL LIGATION Bilateral 10/21/2019   Procedure: LAPAROSCOPIC TUBAL LIGATION WITH CAUTERY;  Surgeon: Princess Bruins, MD;  Location: [facility];  Service: Gynecology;  Laterality: Bilateral;  Request to follow 2nd case in [place] block requests one hour  . WISDOM TOOTH EXTRACTION     x4  . WRIST SURGERY Right 07-23-2013    FAMILY HISTORY: Family History  Problem Relation Age of Onset  . Depression Mother        chronic  . Hearing loss Mother   . Hyperlipidemia Mother   . Vision loss Mother        beachets disease  . COPD Father   .  Diabetes Father   . Cancer Maternal Grandmother 75       breast cancer  . Parkinsonism Maternal Grandmother   . Heart disease Maternal Grandmother   . Breast cancer Maternal Grandmother   . Alzheimer's disease  Paternal Grandmother   . Heart disease Paternal Grandfather        Died age 58  . Nephrolithiasis Paternal Grandfather   . Heart disease Maternal Grandfather   . Cancer Other 60       breast  . Breast cancer Other     SOCIAL HISTORY: Social History   Socioeconomic History  . Marital status: Married    Spouse name: Not on file  . Number of children: 2  . Years of education: Not on file  . Highest education level: Not on file  Occupational History  . Occupation: In home health   Tobacco Use  . Smoking status: Current Every Day Smoker    Packs/day: 0.50    Years: 10.00    Pack years: 5.00    Types: Cigarettes    Last attempt to quit: 01/31/2012    Years since quitting: 8.1  . Smokeless tobacco: Never Used  . Tobacco comment: pt has decreased her use to 1-2 cigarettes per day   Substance and Sexual Activity  . Alcohol use: Yes    Comment: occasionally  . Drug use: No  . Sexual activity: Yes    Birth control/protection: Pill, Condom    Comment: 1st intercourse 34 yo-More than 5 partners  Other Topics Concern  . Not on file  Social History Narrative   Lives with husband and twin sons    Social Determinants of Health   Financial Resource Strain:   . Difficulty of Paying Living Expenses:   Food Insecurity:   . Worried About Charity fundraiser in the Last Year:   . Arboriculturist in the Last Year:   Transportation Needs:   . Film/video editor (Medical):   Marland Kitchen Lack of Transportation (Non-Medical):   Physical Activity:   . Days of Exercise per Week:   . Minutes of Exercise per Session:   Stress:   . Feeling of Stress :   Social Connections:   . Frequency of Communication with Friends and Family:   . Frequency of Social Gatherings with Friends and  Family:   . Attends Religious Services:   . Active Member of Clubs or Organizations:   . Attends Archivist Meetings:   Marland Kitchen Marital Status:   Intimate Partner Violence:   . Fear of Current or Ex-Partner:   . Emotionally Abused:   Marland Kitchen Physically Abused:   . Sexually Abused:       PHYSICAL EXAM  Vitals:   03/25/20 1122  BP: 133/87  Pulse: (!) 115  Weight: 188 lb (85.3 kg)  Height: 5\' 7"  (1.702 m)   Body mass index is 29.44 kg/m.  Generalized: Well developed, in no acute distress  Cardiology: normal rate and rhythm, no murmur noted Respiratory: clear to auscultation bilaterally  Neurological examination  Mentation: Alert oriented to time, place, history taking. Follows all commands speech and language fluent Cranial nerve II-XII: Pupils were equal round reactive to light. Extraocular movements were full, visual field were full  Motor: The motor testing reveals 5 over 5 strength of all 4 extremities. Good symmetric motor tone is noted throughout.  Sensory: Sensory testing is intact to soft touch and pinprick testing decreased of bilateral fingertips, anterior lower leg just above ankle and whole foot, bilaterally  No evidence of extinction is noted.  Gait and station: Gait is normal.  Reflexes: Deep tendon reflexes are decreased but symmetric bilaterally.   DIAGNOSTIC DATA (LABS, IMAGING, TESTING) - I reviewed patient records, labs,  notes, testing and imaging myself where available.  No flowsheet data found.   Lab Results  Component Value Date   WBC 12.8 (H) 10/24/2019   HGB 13.2 10/24/2019   HCT 39.5 10/24/2019   MCV 97.8 10/24/2019   PLT 254 10/24/2019      Component Value Date/Time   NA 141 12/07/2018 0524   K 3.9 12/07/2018 0524   CL 113 (H) 12/07/2018 0524   CO2 21 (L) 12/07/2018 0524   GLUCOSE 92 12/07/2018 0524   BUN 6 12/07/2018 0524   CREATININE 0.58 12/07/2018 0524   CALCIUM 8.9 12/07/2018 0524   PROT 6.6 12/07/2018 0524   ALBUMIN 3.1 (L)  12/07/2018 0524   AST 33 12/07/2018 0524   ALT 29 12/07/2018 0524   ALKPHOS 40 12/07/2018 0524   BILITOT 0.7 12/07/2018 0524   GFRNONAA >60 12/07/2018 0524   GFRAA >60 12/07/2018 0524   No results found for: CHOL, HDL, LDLCALC, LDLDIRECT, TRIG, CHOLHDL Lab Results  Component Value Date   HGBA1C 4.9 11/16/2018   Lab Results  Component Value Date   VITAMINB12 205 (L) 02/13/2019   Lab Results  Component Value Date   TSH 1.618 12/06/2018       ASSESSMENT AND PLAN 34 y.o. year old female  has a past medical history of Abnormal Pap smear, ADD (attention deficit disorder), Anxiety, Guillain Barr syndrome ([place]), Headache(784.0), HSV-1 infection, IBS (irritable bowel syndrome), Knee hyperextension injury (2020), and Tachycardia. here with     ICD-10-CM   1. Polyneuropathy  G62.9   2. History of Guillain-Barre syndrome  Z86.69     Danilyn is doing fairly well. Neuropathy pain continues to be her biggest complaint, however, it has not worsened. Some days are better than others. We will continue Lyrica 200mg  TID. She did not tolerate duloxetine. We will try oxcarbamazepine 150mg  BID. She will start 150mg  daily for two weeks then increase dose if well tolerated. Continue neuropathy cream. May try alpha lipoic acid OTC. She was encouraged to continue healthy, active lifestyle. She was commended for continued alcohol cessation. She will follow up with me in 6 months, sooner if needed.    No orders of the defined types were placed in this encounter.    Meds ordered this encounter  Medications  . OXcarbazepine (TRILEPTAL) 150 MG tablet    Sig: Take 1 tablet (150 mg total) by mouth 2 (two) times daily.    Dispense:  60 tablet    Refill:  11    Order Specific Question:   Supervising Provider    Answer:   Melvenia Beam [id number]      I spent 20 minutes with the patient. 50% of this time was spent counseling and educating patient on plan of care and medications.    Debbora Presto, FNP-C  03/25/2020, 12:16 PM [facility] [address], [place], [place] [address] [phone]   Made any corrections needed, and agree with history, physical, neuro exam,assessment and plan as stated.     Sarina Ill, MD [facility]

## 2020-03-25 NOTE — Patient Instructions (Signed)
We will start oxcarbamazepine 150mg  twice daily. Start with 1 tablet nightly for 2 weeks then increase to 1 tablet twice daily if well tolerated.   Continue Lyrica 200mg  three times daily and neuropathy cream  May be helpful to try alpha lipoic acid 600mg  daily. You can get this at the pharmacy.   Stay active. Healthy lifestyle habits.   Follow up in 6 months, sooner if needed.     Oxcarbazepine tablets What is this medicine? OXCARBAZEPINE (ox car BAZ e peen) is used to treat people with epilepsy. It helps prevent partial seizures. This medicine may be used for other purposes; ask your health care provider or pharmacist if you have questions. COMMON BRAND NAME(S): Trileptal What should I tell my health care provider before I take this medicine? They need to know if you have any of these conditions:  Asian ancestry  kidney disease  liver disease  suicidal thoughts, plans, or attempt; a previous suicide attempt by you or a family member  any unusual or allergic reaction to oxcarbazepine, carbamazepine, other medicines, foods, dyes, or preservatives  pregnant or trying to get pregnant  breast-feeding How should I use this medicine? Take this medicine by mouth with a glass of water. Follow the directions on the prescription label. This medicine may be taken with or without food. Take your doses at regular intervals. Do not take your medicine more often than directed. Do not stop taking except on the advice of your doctor or health care professional. A special MedGuide will be given to you by the pharmacist with each prescription and refill. Be sure to read this information carefully each time. Talk to your pediatrician regarding the use of this medicine in children. While this medicine may be prescribed for children as young as 2 years for selected conditions, precautions do apply. Overdosage: If you think you have taken too much of this medicine contact a poison control center or  emergency room at once. NOTE: This medicine is only for you. Do not share this medicine with others. What if I miss a dose? If you miss a dose, take it as soon as you can. If it is almost time for your next dose, take only that dose. Do not take double or extra doses. What may interact with this medicine? Do not take this medicine with any of the following medications:  carbamazepine This medicine may also interact with the following medications:  birth control pills  certain medicines for seizures like phenobarbital, phenytoin, valproic acid  certain medicines for high blood pressure like felodipine, diltiazem, verapamil  cyclosporine This list may not describe all possible interactions. Give your health care provider a list of all the medicines, herbs, non-prescription drugs, or dietary supplements you use. Also tell them if you smoke, drink alcohol, or use illegal drugs. Some items may interact with your medicine. What should I watch for while using this medicine? Visit your doctor or health care provider for regular checks on your progress. Do not stop taking this medicine suddenly. This increases the risk of seizures. Wear a Probation officer or necklace. Carry an identification card with information about your condition, medications, and doctor or health care provider. This medicine may cause serious skin reactions. They can happen weeks to months after starting the medicine. Contact your health care provider right away if you notice fevers or flu-like symptoms with a rash. The rash may be red or purple and then turn into blisters or peeling of the skin. Or, you  might notice a red rash with swelling of the face, lips or lymph nodes in your neck or under your arms. Rarely, serious skin allergic reactions may occur with this medicine. If you develop a skin rash, redness, itching, peeling skin inside your mouth, swollen glands, or a fever while taking this medicine, contact your health  care provider immediately. You may get drowsy or dizzy. Do not drive, use machinery, or do anything that needs mental alertness until you know how this drug affects you. Do not stand or sit up quickly, especially if you are an older patient. This reduces the risk of dizzy or fainting spells. Alcohol can make you more drowsy and dizzy. Avoid alcoholic drinks. Birth control pills may not work properly while you are taking this medicine. Talk to your doctor about using an extra method of birth control. The use of this medicine may increase the chance of suicidal thoughts or actions. Pay special attention to how you are responding while on this medicine. Any worsening of mood, or thoughts of suicide or dying should be reported to your health care provider right away. Women who become pregnant while using this medicine may enroll in the [place] Pregnancy Registry by calling [phone]. This registry collects information about the safety of antiepileptic drug use during pregnancy. What side effects may I notice from receiving this medicine? Side effects that you should report to your doctor or health care professional as soon as possible:  allergic reactions such as skin rash or itching, hives, swelling of the lips, mouth, tongue, or throat  changes in vision  confusion  difficulty passing urine or change in the amount of urine  infection  nausea, vomiting  problems with balance, speaking, walking  rash, fever, and swollen lymph nodes  redness, blistering, peeling or loosening of the skin, including inside the mouth  swelling of feet, hands  unusual bleeding, bruising  unusually weak or tired  worsening of mood, thoughts or actions of suicide or dying  yellowing of eyes, skin Side effects that usually do not require medical attention (report to your doctor or health care professional if they continue or are bothersome):  constipation or  diarrhea  headache  loss of appetite  nervous  stomach upset  tremors  trouble sleeping This list may not describe all possible side effects. Call your doctor for medical advice about side effects. You may report side effects to FDA at 1-800-FDA-1088. Where should I keep my medicine? Keep out of reach of children. Store at room temperature between 15 and 30 degrees C (59 and 86 degrees F). Keep container tightly closed. Throw away any unused medicine after the expiration date. NOTE: This sheet is a summary. It may not cover all possible information. If you have questions about this medicine, talk to your doctor, pharmacist, or health care provider.  2020 Elsevier/Gold Standard (2019-01-30 10:55:30)

## 2020-04-01 ENCOUNTER — Telehealth: Payer: Self-pay | Admitting: Physician Assistant

## 2020-04-01 NOTE — Telephone Encounter (Signed)
The pt states she has a bleeding fissure and wants advise on how to control the bleeding.  She says it bleeds very easily .  She has an appt on 6/22 with Anderson Malta.  I advised that since she has not been seen here in a couple years she should call PCP or see urgent care for follow up until her appt here if she feels she needs treatment sooner.  The pt has been advised of the information and verbalized understanding.

## 2020-04-28 ENCOUNTER — Ambulatory Visit: Payer: Self-pay | Admitting: Physician Assistant

## 2020-06-17 DIAGNOSIS — M84375A Stress fracture, left foot, initial encounter for fracture: Secondary | ICD-10-CM | POA: Insufficient documentation

## 2020-07-08 ENCOUNTER — Ambulatory Visit (INDEPENDENT_AMBULATORY_CARE_PROVIDER_SITE_OTHER): Payer: PRIVATE HEALTH INSURANCE | Admitting: Nurse Practitioner

## 2020-07-08 ENCOUNTER — Other Ambulatory Visit: Payer: Self-pay

## 2020-07-08 ENCOUNTER — Encounter: Payer: Self-pay | Admitting: Nurse Practitioner

## 2020-07-08 VITALS — BP 120/78 | Ht 67.0 in | Wt 180.0 lb

## 2020-07-08 DIAGNOSIS — K649 Unspecified hemorrhoids: Secondary | ICD-10-CM | POA: Diagnosis not present

## 2020-07-08 DIAGNOSIS — Z01419 Encounter for gynecological examination (general) (routine) without abnormal findings: Secondary | ICD-10-CM | POA: Diagnosis not present

## 2020-07-08 DIAGNOSIS — N951 Menopausal and female climacteric states: Secondary | ICD-10-CM

## 2020-07-08 DIAGNOSIS — Z113 Encounter for screening for infections with a predominantly sexual mode of transmission: Secondary | ICD-10-CM

## 2020-07-08 MED ORDER — HYDROCORTISONE 1 % EX CREA: 1.0000 "application " | TOPICAL_CREAM | CUTANEOUS | 1 refills | Status: DC | PRN

## 2020-07-08 NOTE — Progress Notes (Addendum)
   Kimberly [name] Jul 13, 1986 [id number]   History:  34 y.o. G1P0102 presents for annual exam. BTL. Normal pap history. Complains of hot flashes, night sweats, and mood changes. Her cycles are also starting to become irregular. Mother went through menopause in her 53s. Mother and maternal grandmother and great grandmother with history of breast cancer. Mother BRCA negative. Has painful anal fissures and hemorrhoids.  Gynecologic History Patient's last menstrual period was 04/22/2020. Period Pattern: (!) Irregular Menstrual Flow: Moderate Menstrual Control: Maxi pad, Tampon Dysmenorrhea: (!) Mild Dysmenorrhea Symptoms: Cramping Contraception: tubal ligation Last Pap: 07/03/2019. Results were: normal   Past medical history, past surgical history, family history and social history were all reviewed and documented in the EPIC chart.  ROS:  A ROS was performed and pertinent positives and negatives are included.  Exam:  Vitals:   07/08/20 1207  BP: 120/78  Weight: 180 lb (81.6 kg)  Height: $Remove'5\' 7"'KOnhRId$  (1.702 m)   Body mass index is 28.19 kg/m.  General appearance:  Normal Thyroid:  Symmetrical, normal in size, without palpable masses or nodularity. Respiratory  Auscultation:  Clear without wheezing or rhonchi Cardiovascular  Auscultation:  Regular rate, without rubs, murmurs or gallops  Edema/varicosities:  Not grossly evident Abdominal  Soft,nontender, without masses, guarding or rebound.  Liver/spleen:  No organomegaly noted  Hernia:  None appreciated  Skin  Inspection:  Grossly normal   Breasts: Examined lying and sitting.   Right: Without masses, retractions, discharge or axillary adenopathy.   Left: Without masses, retractions, discharge or axillary adenopathy. Gentitourinary   Inguinal/mons:  Normal without inguinal adenopathy  External genitalia:  Normal  BUS/Urethra/Skene's glands:  Normal  Vagina:  Normal  Cervix:  Normal  Uterus:  Difficult to palpate due to body  habitus but no gross masses or tenderness  Adnexa/parametria:     Rt: Without masses or tenderness.   Lt: Without masses or tenderness.  Anus and perineum: Normal  Digital rectal exam: Refused  Assessment/Plan:  34 y.o. [id number] for annual exam.   Well female exam with routine gynecological exam - Education provided on SBEs, importance of preventative screenings, current guidelines, high calcium diet, regular exercise, and multivitamin daily. Labs done with primary care.   Menopausal symptoms - Plan: Follicle stimulating hormone. Hot flashes, night sweats, mood changes, and cycle irregularity. Mother went through menopause in her 48s.   Screen for STD (sexually transmitted disease) - Plan: C. trachomatis/N. gonorrhoeae RNA, HIV Antibody (routine testing w rflx), RPR  Hemorrhoids, unspecified hemorrhoid type - Plan: hydrocortisone cream (PREPARATION H) 1 % apply as needed   Follow up in 1 year for annual      [place], 12:14 PM 07/08/2020

## 2020-07-08 NOTE — Patient Instructions (Addendum)
Health Maintenance, Female Adopting a healthy lifestyle and getting preventive care are important in promoting health and wellness. Ask your health care provider about:  The right schedule for you to have regular tests and exams.  Things you can do on your own to prevent diseases and keep yourself healthy. What should I know about diet, weight, and exercise? Eat a healthy diet   Eat a diet that includes plenty of vegetables, fruits, low-fat dairy products, and lean protein.  Do not eat a lot of foods that are high in solid fats, added sugars, or sodium. Maintain a healthy weight Body mass index (BMI) is used to identify weight problems. It estimates body fat based on height and weight. Your health care provider can help determine your BMI and help you achieve or maintain a healthy weight. Get regular exercise Get regular exercise. This is one of the most important things you can do for your health. Most adults should:  Exercise for at least 150 minutes each week. The exercise should increase your heart rate and make you sweat (moderate-intensity exercise).  Do strengthening exercises at least twice a week. This is in addition to the moderate-intensity exercise.  Spend less time sitting. Even light physical activity can be beneficial. Watch cholesterol and blood lipids Have your blood tested for lipids and cholesterol at 36 years of age, then have this test every 5 years. Have your cholesterol levels checked more often if:  Your lipid or cholesterol levels are high.  You are older than 36 years of age.  You are at high risk for heart disease. What should I know about cancer screening? Depending on your health history and family history, you may need to have cancer screening at various ages. This may include screening for:  Breast cancer.  Cervical cancer.  Colorectal cancer.  Skin cancer.  Lung cancer. What should I know about heart disease, diabetes, and high blood  pressure? Blood pressure and heart disease  High blood pressure causes heart disease and increases the risk of stroke. This is more likely to develop in people who have high blood pressure readings, are of African descent, or are overweight.  Have your blood pressure checked: ? Every 3-5 years if you are 18-39 years of age. ? Every year if you are 40 years old or older. Diabetes Have regular diabetes screenings. This checks your fasting blood sugar level. Have the screening done:  Once every three years after age 40 if you are at a normal weight and have a low risk for diabetes.  More often and at a younger age if you are overweight or have a high risk for diabetes. What should I know about preventing infection? Hepatitis B If you have a higher risk for hepatitis B, you should be screened for this virus. Talk with your health care provider to find out if you are at risk for hepatitis B infection. Hepatitis C Testing is recommended for:  Everyone born from 1945 through 1965.  Anyone with known risk factors for hepatitis C. Sexually transmitted infections (STIs)  Get screened for STIs, including gonorrhea and chlamydia, if: ? You are sexually active and are younger than 36 years of age. ? You are older than 36 years of age and your health care provider tells you that you are at risk for this type of infection. ? Your sexual activity has changed since you were last screened, and you are at increased risk for chlamydia or gonorrhea. Ask your health care provider if   you are at risk.  Ask your health care provider about whether you are at high risk for HIV. Your health care provider may recommend a prescription medicine to help prevent HIV infection. If you choose to take medicine to prevent HIV, you should first get tested for HIV. You should then be tested every 3 months for as long as you are taking the medicine. Pregnancy  If you are about to stop having your period (premenopausal) and  you may become pregnant, seek counseling before you get pregnant.  Take 400 to 800 micrograms (mcg) of folic acid every day if you become pregnant.  Ask for birth control (contraception) if you want to prevent pregnancy. Osteoporosis and menopause Osteoporosis is a disease in which the bones lose minerals and strength with aging. This can result in bone fractures. If you are 65 years old or older, or if you are at risk for osteoporosis and fractures, ask your health care provider if you should:  Be screened for bone loss.  Take a calcium or vitamin D supplement to lower your risk of fractures.  Be given hormone replacement therapy (HRT) to treat symptoms of menopause. Follow these instructions at home: Lifestyle  Do not use any products that contain nicotine or tobacco, such as cigarettes, e-cigarettes, and chewing tobacco. If you need help quitting, ask your health care provider.  Do not use street drugs.  Do not share needles.  Ask your health care provider for help if you need support or information about quitting drugs. Alcohol use  Do not drink alcohol if: ? Your health care provider tells you not to drink. ? You are pregnant, may be pregnant, or are planning to become pregnant.  If you drink alcohol: ? Limit how much you use to 0-1 drink a day. ? Limit intake if you are breastfeeding.  Be aware of how much alcohol is in your drink. In the [place] one drink equals one 12 oz bottle of beer (355 mL), one 5 oz glass of wine (148 mL), or one 1 oz glass of hard liquor (44 mL). General instructions  Schedule regular health, dental, and eye exams.  Stay current with your vaccines.  Tell your health care provider if: ? You often feel depressed. ? You have ever been abused or do not feel safe at home. Summary  Adopting a healthy lifestyle and getting preventive care are important in promoting health and wellness.  Follow your health care provider's instructions about healthy  diet, exercising, and getting tested or screened for diseases.  Follow your health care provider's instructions on monitoring your cholesterol and blood pressure. This information is not intended to replace advice given to you by your health care provider. Make sure you discuss any questions you have with your health care provider. Document Revised: 10/17/2018 Document Reviewed: 10/17/2018 Elsevier Patient Education  2020 Else[employer] Inc[employer]  Menopause Menopause is the normal time of life when menstrual periods stop completely. It is usually confirmed by 12 months without a menstrual period. The transition to menopause (perimenopause) most often happens between the ages of 45 and 55. During perimenopause, hormone levels change in your body, which can cause symptoms and affect your health. Menopause may increase your risk for:  Loss of bone (osteoporosis), which causes bone breaks (fractures).  Depression.  Hardening and narrowing of the arteries (atherosclerosis), which can cause heart attacks and strokes. What are the causes? This condition is usually caused by a natural change in hormone levels that happens as you   get older. The condition may also be caused by surgery to remove both ovaries (bilateral oophorectomy). What increases the risk? This condition is more likely to start at an earlier age if you have certain medical conditions or treatments, including:  A tumor of the pituitary gland in the brain.  A disease that affects the ovaries and hormone production.  Radiation treatment for cancer.  Certain cancer treatments, such as chemotherapy or hormone (anti-estrogen) therapy.  Heavy smoking and excessive alcohol use.  Family history of early menopause. This condition is also more likely to develop earlier in women who are very thin. What are the signs or symptoms? Symptoms of this condition include:  Hot flashes.  Irregular menstrual periods.  Night sweats.  Changes in  feelings about sex. This could be a decrease in sex drive or an increased comfort around your sexuality.  Vaginal dryness and thinning of the vaginal walls. This may cause painful intercourse.  Dryness of the skin and development of wrinkles.  Headaches.  Problems sleeping (insomnia).  Mood swings or irritability.  Memory problems.  Weight gain.  Hair growth on the face and chest.  Bladder infections or problems with urinating. How is this diagnosed? This condition is diagnosed based on your medical history, a physical exam, your age, your menstrual history, and your symptoms. Hormone tests may also be done. How is this treated? In some cases, no treatment is needed. You and your health care provider should make a decision together about whether treatment is necessary. Treatment will be based on your individual condition and preferences. Treatment for this condition focuses on managing symptoms. Treatment may include:  Menopausal hormone therapy (MHT).  Medicines to treat specific symptoms or complications.  Acupuncture.  Vitamin or herbal supplements. Before starting treatment, make sure to let your health care provider know if you have a personal or family history of:  Heart disease.  Breast cancer.  Blood clots.  Diabetes.  Osteoporosis. Follow these instructions at home: Lifestyle  Do not use any products that contain nicotine or tobacco, such as cigarettes and e-cigarettes. If you need help quitting, ask your health care provider.  Get at least 30 minutes of physical activity on 5 or more days each week.  Avoid alcoholic and caffeinated beverages, as well as spicy foods. This may help prevent hot flashes.  Get 7-8 hours of sleep each night.  If you have hot flashes, try: ? Dressing in layers. ? Avoiding things that may trigger hot flashes, such as spicy food, warm places, or stress. ? Taking slow, deep breaths when a hot flash starts. ? Keeping a fan in  your home and office.  Find ways to manage stress, such as deep breathing, meditation, or journaling.  Consider going to group therapy with other women who are having menopause symptoms. Ask your health care provider about recommended group therapy meetings. Eating and drinking  Eat a healthy, balanced diet that contains whole grains, lean protein, low-fat dairy, and plenty of fruits and vegetables.  Your health care provider may recommend adding more soy to your diet. Foods that contain soy include tofu, tempeh, and soy milk.  Eat plenty of foods that contain calcium and vitamin D for bone health. Items that are rich in calcium include low-fat milk, yogurt, beans, almonds, sardines, broccoli, and kale. Medicines  Take over-the-counter and prescription medicines only as told by your health care provider.  Talk with your health care provider before starting any herbal supplements. If prescribed, take vitamins and supplements   as told by your health care provider. These may include: ? Calcium. Women age 51 and older should get 1,200 mg (milligrams) of calcium every day. ? Vitamin D. Women need 600-800 International Units of vitamin D each day. ? Vitamins B12 and B6. Aim for 50 micrograms of B12 and 1.5 mg of B6 each day. General instructions  Keep track of your menstrual periods, including: ? When they occur. ? How heavy they are and how long they last. ? How much time passes between periods.  Keep track of your symptoms, noting when they start, how often you have them, and how long they last.  Use vaginal lubricants or moisturizers to help with vaginal dryness and improve comfort during sex.  Keep all follow-up visits as told by your health care provider. This is important. This includes any group therapy or counseling. Contact a health care provider if:  You are still having menstrual periods after age 55.  You have pain during sex.  You have not had a period for 12 months and  you develop vaginal bleeding. Get help right away if:  You have: ? Severe depression. ? Excessive vaginal bleeding. ? Pain when you urinate. ? A fast or irregular heart beat (palpitations). ? Severe headaches. ? Abdomen (abdominal) pain or severe indigestion.  You fell and you think you have a broken bone.  You develop leg or chest pain.  You develop vision problems.  You feel a lump in your breast. Summary  Menopause is the normal time of life when menstrual periods stop completely. It is usually confirmed by 12 months without a menstrual period.  The transition to menopause (perimenopause) most often happens between the ages of 45 and 55.  Symptoms can be managed through medicines, lifestyle changes, and complementary therapies such as acupuncture.  Eat a balanced diet that is rich in nutrients to promote bone health and heart health and to manage symptoms during menopause. This information is not intended to replace advice given to you by your health care provider. Make sure you discuss any questions you have with your health care provider. Document Revised: 10/06/2017 Document Reviewed: 11/26/2016 Elsevier Patient Education  2020 Elsevier Inc.  

## 2020-07-09 LAB — C. TRACHOMATIS/N. GONORRHOEAE RNA
C. trachomatis RNA, TMA: NOT DETECTED
N. gonorrhoeae RNA, TMA: NOT DETECTED

## 2020-07-09 LAB — HIV ANTIBODY (ROUTINE TESTING W REFLEX): HIV 1&2 Ab, 4th Generation: NONREACTIVE

## 2020-07-09 LAB — RPR: RPR Ser Ql: NONREACTIVE

## 2020-07-09 LAB — FOLLICLE STIMULATING HORMONE: FSH: 7.7 m[IU]/mL

## 2020-08-12 IMAGING — CR DG CHEST 2V
2 series · 2 of 2 positions shown · non-contrast
Comparison: None.

CLINICAL DATA: Nausea vomiting and diarrhea for several weeks.
Dehydration. Cough

EXAM:
CHEST - 2 VIEW

[chest pa]
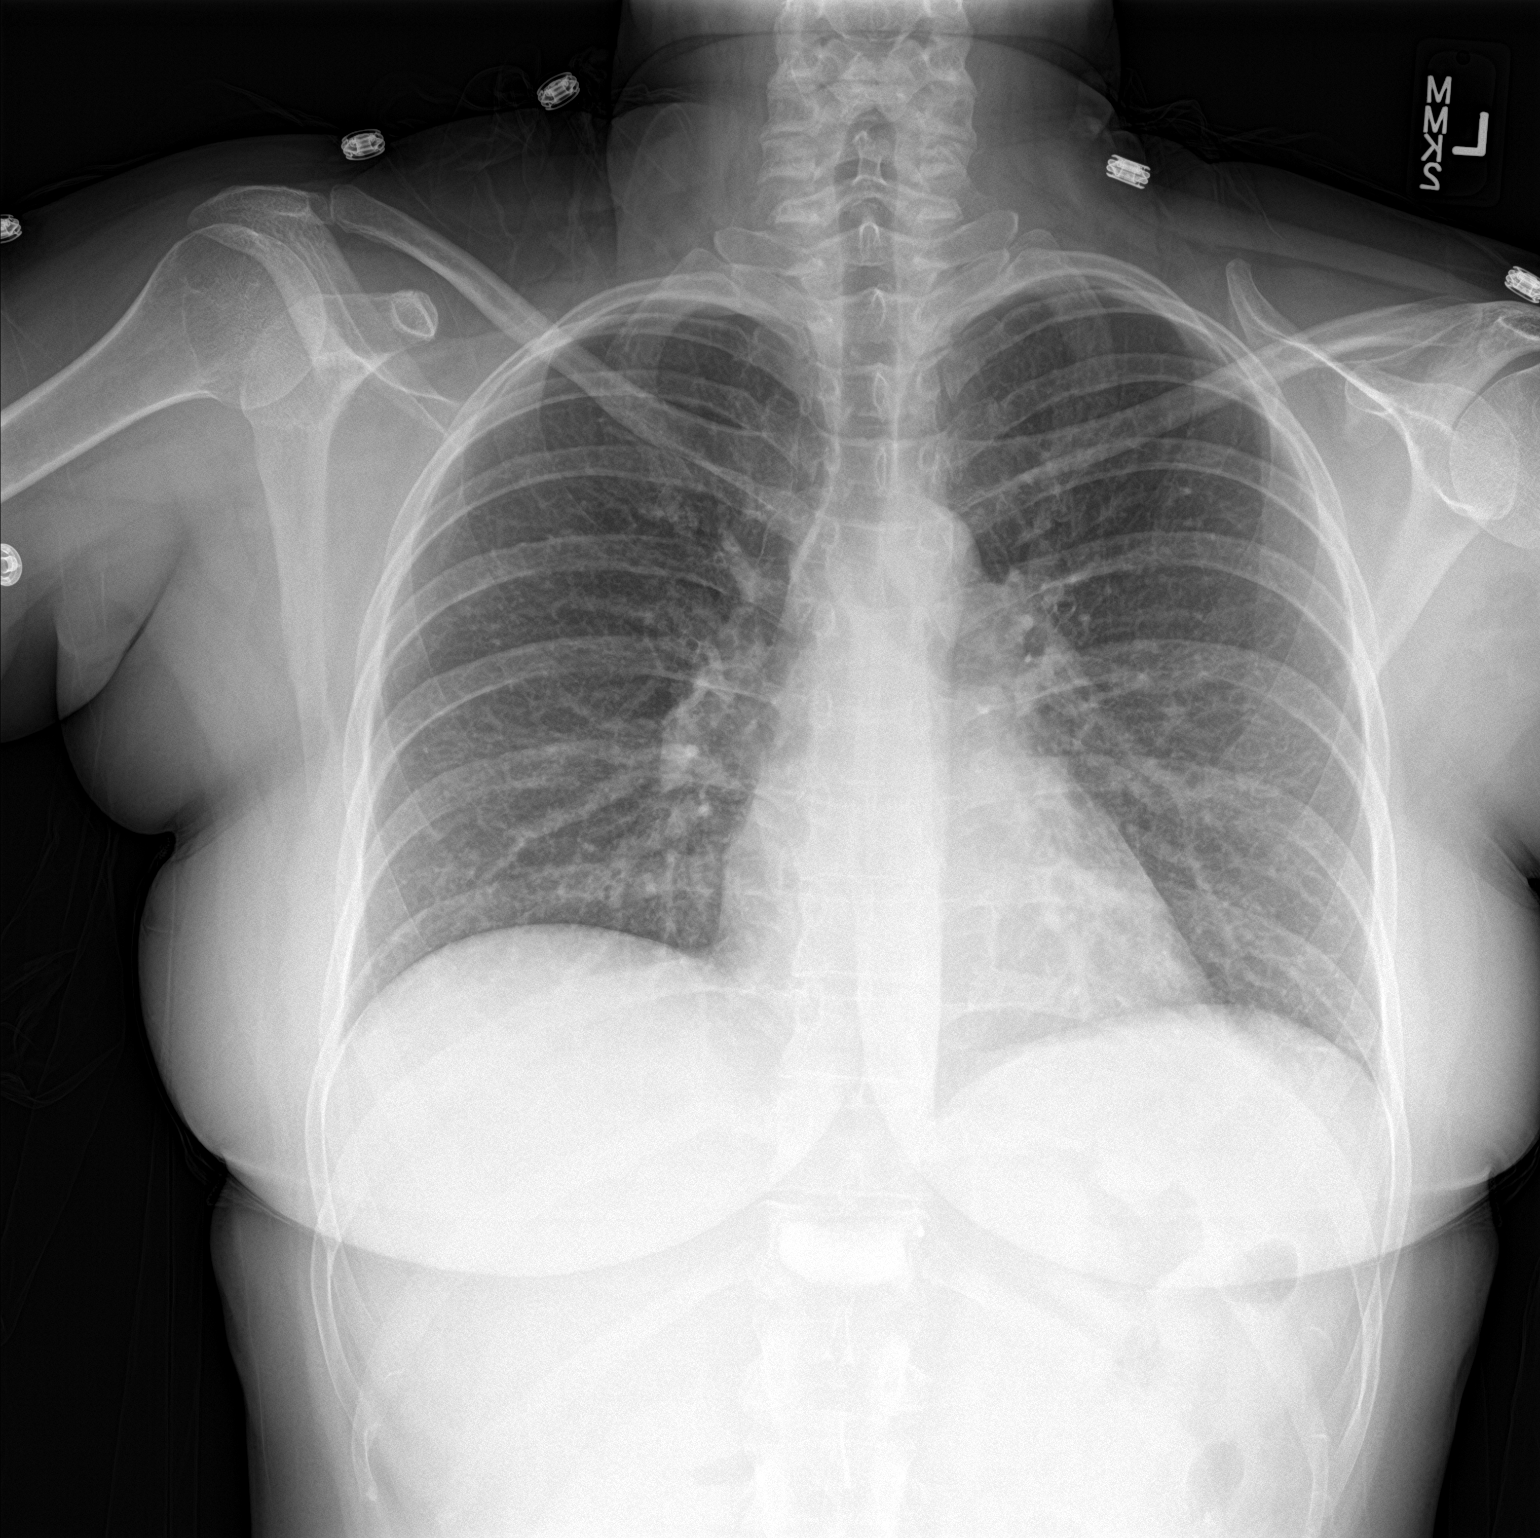

[chest lat]
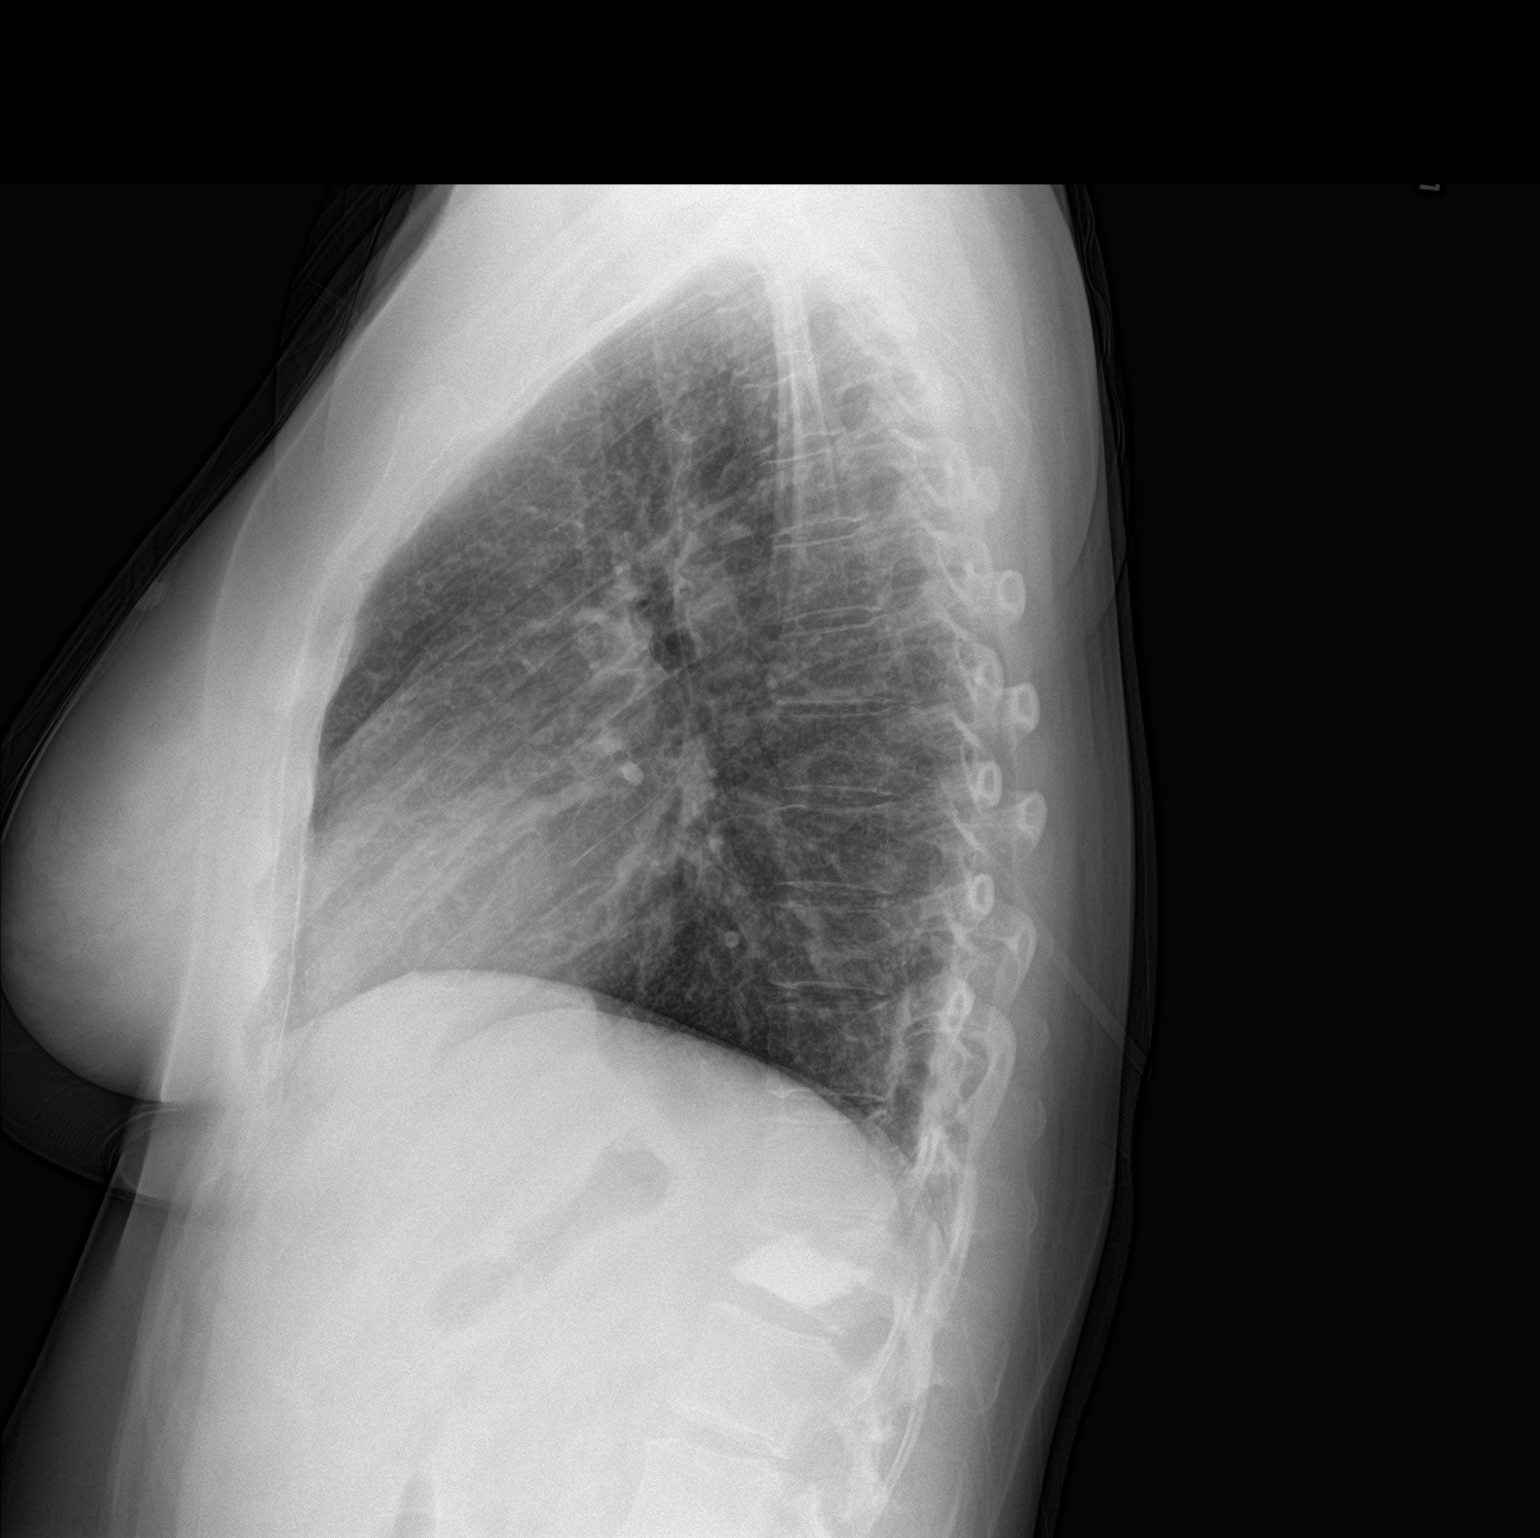

[2 of 2 positions shown; findings below may reference images not displayed]

FINDINGS: The heart size and mediastinal contours are within normal limits.
Both lungs are clear. The visualized skeletal structures are
unremarkable.
IMPRESSION: No active cardiopulmonary disease.

## 2020-08-17 ENCOUNTER — Other Ambulatory Visit: Payer: Self-pay | Admitting: Family Medicine

## 2020-08-17 NOTE — Telephone Encounter (Signed)
Pt request refill pregabalin (LYRICA) 200 MG capsule at [facility]

## 2020-08-18 MED ORDER — PREGABALIN 200 MG PO CAPS: 200.0000 mg | ORAL_CAPSULE | Freq: Three times a day (TID) | ORAL | 1 refills | Status: DC

## 2020-08-18 NOTE — Telephone Encounter (Signed)
Pt called, Lyrica was sent to the wrong pharmacy. Ask that Lyrica sent to [facility].

## 2020-08-19 NOTE — Addendum Note (Signed)
Addended by: Elita Quick on: 08/19/2020 04:56 PM   Modules accepted: Orders

## 2020-08-20 MED ORDER — PREGABALIN 200 MG PO CAPS: 200.0000 mg | ORAL_CAPSULE | Freq: Three times a day (TID) | ORAL | 1 refills | Status: DC

## 2020-09-30 ENCOUNTER — Encounter: Payer: Self-pay | Admitting: Family Medicine

## 2020-09-30 ENCOUNTER — Ambulatory Visit (INDEPENDENT_AMBULATORY_CARE_PROVIDER_SITE_OTHER): Payer: PRIVATE HEALTH INSURANCE | Admitting: Family Medicine

## 2020-09-30 VITALS — BP 144/95 | HR 100 | Ht 67.0 in | Wt 178.0 lb

## 2020-09-30 DIAGNOSIS — G629 Polyneuropathy, unspecified: Secondary | ICD-10-CM | POA: Diagnosis not present

## 2020-09-30 MED ORDER — OXCARBAZEPINE 150 MG PO TABS: 150.0000 mg | ORAL_TABLET | Freq: Two times a day (BID) | ORAL | 11 refills | Status: DC

## 2020-09-30 NOTE — Patient Instructions (Signed)
Below is our plan:  We will continue Lyrica 200mg  three times daily and restart oxcarpazepine. Start with 1 tablet at night for the next week then increase to twice daily.   Please make sure you are staying well hydrated. I recommend 50-60 ounces daily. Well balanced diet and regular exercise encouraged.    Please continue follow up with care team as directed.   Follow up in 6 months   You may receive a survey regarding today's visit. I encourage you to leave honest feed back as I do use this information to improve patient care. Thank you for seeing me today!     Neuropathic Pain Neuropathic pain is pain caused by damage to the nerves that are responsible for certain sensations in your body (sensory nerves). The pain can be caused by:  Damage to the sensory nerves that send signals to your spinal cord and brain (peripheral nervous system).  Damage to the sensory nerves in your brain or spinal cord (central nervous system). Neuropathic pain can make you more sensitive to pain. Even a minor sensation can feel very painful. This is usually a long-term condition that can be difficult to treat. The type of pain differs from person to person. It may:  Start suddenly (acute), or it may develop slowly and last for a long time (chronic).  Come and go as damaged nerves heal, or it may stay at the same level for years.  Cause emotional distress, loss of sleep, and a lower quality of life. What are the causes? The most common cause of this condition is diabetes. Many other diseases and conditions can also cause neuropathic pain. Causes of neuropathic pain can be classified as:  Toxic. This is caused by medicines and chemicals. The most common cause of toxic neuropathic pain is damage from cancer treatments (chemotherapy).  Metabolic. This can be caused by: ? Diabetes. This is the most common disease that damages the nerves. ? Lack of vitamin B from long-term alcohol abuse.  Traumatic. Any  injury that cuts, crushes, or stretches a nerve can cause damage and pain. A common example is feeling pain after losing an arm or leg (phantom limb pain).  Compression-related. If a sensory nerve gets trapped or compressed for a long period of time, the blood supply to the nerve can be cut off.  Vascular. Many blood vessel diseases can cause neuropathic pain by decreasing blood supply and oxygen to nerves.  Autoimmune. This type of pain results from diseases in which the body's defense system (immune system) mistakenly attacks sensory nerves. Examples of autoimmune diseases that can cause neuropathic pain include lupus and multiple sclerosis.  Infectious. Many types of viral infections can damage sensory nerves and cause pain. Shingles infection is a common cause of this type of pain.  Inherited. Neuropathic pain can be a symptom of many diseases that are passed down through families (genetic). What increases the risk? You are more likely to develop this condition if:  You have diabetes.  You smoke.  You drink too much alcohol.  You are taking certain medicines, including medicines that kill cancer cells (chemotherapy) or that treat immune system disorders. What are the signs or symptoms? The main symptom is pain. Neuropathic pain is often described as:  Burning.  Shock-like.  Stinging.  Hot or cold.  Itching. How is this diagnosed? No single test can diagnose neuropathic pain. It is diagnosed based on:  Physical exam and your symptoms. Your health care provider will ask you about your  pain. You may be asked to use a pain scale to describe how bad your pain is.  Tests. These may be done to see if you have a high sensitivity to pain and to help find the cause and location of any sensory nerve damage. They include: ? Nerve conduction studies to test how well nerve signals travel through your sensory nerves (electrodiagnostic testing). ? Stimulating your sensory nerves through  electrodes on your skin and measuring the response in your spinal cord and brain (somatosensory evoked potential).  Imaging studies, such as: ? X-rays. ? CT scan. ? MRI. How is this treated? Treatment for neuropathic pain may change over time. You may need to try different treatment options or a combination of treatments. Some options include:  Treating the underlying cause of the neuropathy, such as diabetes, kidney disease, or vitamin deficiencies.  Stopping medicines that can cause neuropathy, such as chemotherapy.  Medicine to relieve pain. Medicines may include: ? Prescription or over-the-counter pain medicine. ? Anti-seizure medicine. ? Antidepressant medicines. ? Pain-relieving patches that are applied to painful areas of skin. ? A medicine to numb the area (local anesthetic), which can be injected as a nerve block.  Transcutaneous nerve stimulation. This uses electrical currents to block painful nerve signals. The treatment is painless.  Alternative treatments, such as: ? Acupuncture. ? Meditation. ? Massage. ? Physical therapy. ? Pain management programs. ? Counseling. Follow these instructions at home: Medicines   Take over-the-counter and prescription medicines only as told by your health care provider.  Do not drive or use heavy machinery while taking prescription pain medicine.  If you are taking prescription pain medicine, take actions to prevent or treat constipation. Your health care provider may recommend that you: ? Drink enough fluid to keep your urine pale yellow. ? Eat foods that are high in fiber, such as fresh fruits and vegetables, whole grains, and beans. ? Limit foods that are high in fat and processed sugars, such as fried or sweet foods. ? Take an over-the-counter or prescription medicine for constipation. Lifestyle   Have a good support system at home.  Consider joining a chronic pain support group.  Do not use any products that contain  nicotine or tobacco, such as cigarettes and e-cigarettes. If you need help quitting, ask your health care provider.  Do not drink alcohol. General instructions  Learn as much as you can about your condition.  Work closely with all your health care providers to find the treatment plan that works best for you.  Ask your health care provider what activities are safe for you.  Keep all follow-up visits as told by your health care provider. This is important. Contact a health care provider if:  Your pain treatments are not working.  You are having side effects from your medicines.  You are struggling with tiredness (fatigue), mood changes, depression, or anxiety. Summary  Neuropathic pain is pain caused by damage to the nerves that are responsible for certain sensations in your body (sensory nerves).  Neuropathic pain may come and go as damaged nerves heal, or it may stay at the same level for years.  Neuropathic pain is usually a long-term condition that can be difficult to treat. Consider joining a chronic pain support group. This information is not intended to replace advice given to you by your health care provider. Make sure you discuss any questions you have with your health care provider. Document Revised: 02/14/2019 Document Reviewed: 11/10/2017 Elsevier Patient Education  [employer].

## 2020-09-30 NOTE — Progress Notes (Signed)
PATIENT: Kimberly [name] DOB: Dec 06, 1985  REASON FOR VISIT: follow up HISTORY FROM: patient  Chief Complaint  Patient presents with  . Follow-up    polyneuropathy, rm 1, alone, pt report no improvements     HISTORY OF PRESENT ILLNESS: Today 09/30/20 Kimberly [name] is a 34 y.o. female here today for follow up. We started oxcarbazepine 150mg  twice daily in 03/2020. She took medication for about a month and feels that it worked well but did not continue medication. She tolerated medication well. She continues Lyrica 200mg  TID. Last refill 08/21/2020 for 90 day supply. Neuropathy is stable. No worsening since last visit.    History (from previous notes)  Kimberly [name] is a 34 y.o. female here today for follow up. She continues to have burning pain, tingling and numbness or both shins and feet. Fingers are still numb and tingly. She does not feel there is any improvement overall, but does feel that some days are better than others. No weakness. She has had a couple of mechanical falls but denies injuries. She continues Lyrica 200mg  TID. She tried duloxetine but states that she did not like how she felt on it. She felt disconnected and sleepy. She did get the compounded neuropathy cream that she feels helps temporarily. CBD oil helps too. No vision changes, no double vision. She did get an eye exam at [facility] and was told that that she needed new glasses but otherwise everything looked ok.    HISTORY: (copied from my note on 09/25/2019)  Kimberly [name] is a 34 y.o. female here today for follow up post GB diagnosis in 11/2018. She was advised to increase Lyrica to 200mg  TID at last visit in 03/2019. If she misses a dose she definitely notes worsening but continues to have significant tingling and sharp pains in bilateral feet and calves. She also notes continues numbness of bilateral hands (mostly thumb and first finger bilaterally). She will take Tramadol 50mg  as needed that she feels  helps significantly. She has not tried duloxetine in the past. No other antidepressants. Weakness has improved significantly. She feels that she is clumsy. She reports multiple falls. She did very well with PT in March but does not feel she has time to work with PT right now. She broke left ankle in 05/2019. She rolled her ankle while walking. She initially felt pain but then states pain resolved. Swelling continued which prompted orthopedic visit. She reports being in a cast for several weeks.  She has gained 30 pounds this year. She is very active with her twins (36 year old). No vision changes or returning double vision. She did not see Dr [name]. She states that their office didn't call her. She reports drinking "every now and then." She last drank alcohol at her brother's wedding this past weekend. Drink of choice is liquor.    HISTORY: (copied from Dr [name] note on 03/25/2019)  03/25/2019: She can scratch her calf and feel her toe, strange sensory perceptions. Some days she has some feeling and then it goes away. Discussed this could be the nerves regenerating. She still has some nerve pain but improving, but she has pain in the arches of her feet and keeps cotton balls between her toes because it hurts for them to rub together. lyrica helps, will split it tid for better coverage.She feels great, she was hiking in the woods and swimming with her kids, she had a knee injury and the sensory changes bother her but  feels doing well. Will change lyrice to 200mg  tid for better coverage. F/u 3 months  Interval history 02/12/2019: Patient with a prior history of opioid abuse, GBS recently this year admitted for IVIG then discharged, admitted again for possible thiamine deficiency. She is feeling better. Overall she is improving. She is still numb from below her breast, her legs are numb and lower legs and feet and nerve pain.She can't feel her toes and fingers. She graduated from PT. She has 36 year old twin  boys. She has started riding a bike. She has been losing hair. This is not my specialty, I'm not aware Lyrica can do this but unsure, may be stress, advised to discuss with pcp because this is not my area of expertise. She has bald spots, recommended pcp and possibly derm. She has double vision, started in the hospital the first time but worsening. She will see objects side by side, worse up closer, closing left eye makes it go away. Her family thinks she is drunk a lot due to her walking and she sounds and looks intoxicated. These symptoms have been stable for 4 weeks.She has not been taking her B1 supplement because it was too high and then it was normal. Speech Therapy.   Interval History: She is doing well. She was discharged from the hospital about 6 days ago. She was readmitted for concerns of thiamin deficiency. She is slowly regaining feeling. She can feel in bilateral feet and hands. She can feel pressure most everywhere else with exception of her abdomen. She continues thiamin, gabapentin and Lyrica. She does continue to have numbness, tingling and shooting pains. She is working with PT. She has finished therapy with OT. She is requesting a handicap placard and refill of her oxycodone-acetaminophen. She continues this q6hr for pain.   Kimberly A Bevilleis a 34 y.o.femalehere as requested by Dr. Unknown Jim numbness. PMHx depression. Swelling in the feet and legs for several months, placed on diuretic around the end of the year, she became dehydrated. The pain started when the swelling reduced. She was significantly swollen, she "didn't have ankles", then they went numb in the area of the swelling. But then it started moving up the legs 5 days ago and now is up to her chest and her left shoulder and arm is tingly but she is numb. Now her mouth is numb. She has "serious weakness", she can barely walk or hold onto herself a lot of times. When she is walking she feels like her legs are going to  come out from beneath her. She has not fallen. Still progressing. She is tight when she lays down flat but she cant feel anything from the neck down.   Reviewed notes, labs and imaging from outside physicians, which showed:  Patient presented on 11/06/2018 with bilateral leg pain and swelling of the ankles and feet. Reviewed Dr. Delanna Ahmadi notes. She was put on Lasix and had hypokalemia. She was prescribed potassium. Went to ED and diagnosed with dehydration. Urine culture positive for E. coli but patient denied treatment was told UA in hospital normal. Labs also showed elevated MCV of 106. Prior history of alcoholism treated 2 years ago and denies current use. Believes she was tested for B12 and folate no lab results in chart TSH was normal in July 2019. Still complaining of nausea and vomiting.TSH 0.78. B12, folate was drawn no results yet.     REVIEW OF SYSTEMS: Out of a complete 14 system review of symptoms, the patient complains  only of the following symptoms, neuropathy and all other reviewed systems are negative.   ALLERGIES: Allergies  Allergen Reactions  . Tape Itching and Other (See Comments)    Clear, "plastic" tape is the culprit    HOME MEDICATIONS: Outpatient Medications Prior to Visit  Medication Sig Dispense Refill  . ALPRAZolam (XANAX) 0.5 MG tablet Take 0.5 mg by mouth 3 (three) times daily.    . calcium carbonate (TUMS EX) 750 MG chewable tablet Chew 1-2 tablets by mouth as needed (for heartburn or indigestion).    Marland Kitchen escitalopram (LEXAPRO) 10 MG tablet Take 10 mg by mouth daily.    . hydrocortisone cream (PREPARATION H) 1 % Apply 1 application topically as needed for itching. 30 g 1  . ibuprofen (ADVIL,MOTRIN) 200 MG tablet Take 800 mg by mouth every 6 (six) hours as needed for mild pain or moderate pain.     Marland Kitchen loperamide (IMODIUM) 2 MG capsule Take 2 mg by mouth as needed for diarrhea or loose stools.     . ondansetron (ZOFRAN) 4 MG tablet Take every 4-6  hours as needed. 30 tablet 1  . pantoprazole (PROTONIX) 40 MG tablet TAKE 1 TABLET BY MOUTH DAILY. TAKE 30-60 MINUTES BEFORE BREAKFAST 90 tablet 0  . pregabalin (LYRICA) 200 MG capsule Take 1 capsule (200 mg total) by mouth 3 (three) times daily. 270 capsule 1  . OXcarbazepine (TRILEPTAL) 150 MG tablet Take 1 tablet (150 mg total) by mouth 2 (two) times daily. (Patient not taking: Reported on 09/30/2020) 60 tablet 11  . thiamine (VITAMIN B-1) 100 MG tablet Take 1 tablet (100 mg total) by mouth daily. 30 tablet 0   No facility-administered medications prior to visit.    PAST MEDICAL HISTORY: Past Medical History:  Diagnosis Date  . Abnormal Pap smear   . ADD (attention deficit disorder)   . Anxiety   . Guillain Barr syndrome ([place])   . Headache(784.0)   . HSV-1 infection   . IBS (irritable bowel syndrome)   . Knee hyperextension injury 2020   Left, being treated by ortho  . Tachycardia     PAST SURGICAL HISTORY: Past Surgical History:  Procedure Laterality Date  . BREAST BIOPSY Right   . BREAST SURGERY     Biopsy-benign  . CESAREAN SECTION  09/13/2012   Procedure: CESAREAN SECTION;  Surgeon: Lavonia Drafts, MD;  Location: [facility];  Service: Obstetrics;  Laterality: N/A;  . cortisone shot R wrist Right 2020  . KYPHOPLASTY N/A 08/27/2013   Procedure: Thoracic Twelve Kyphoplasty;  Surgeon: Erline Levine, MD;  Location: [facility];  Service: Neurosurgery;  Laterality: N/A;  T12 Kyphoplasty  . LAPAROSCOPIC TUBAL LIGATION Bilateral 10/21/2019   Procedure: LAPAROSCOPIC TUBAL LIGATION WITH CAUTERY;  Surgeon: Princess Bruins, MD;  Location: [facility];  Service: Gynecology;  Laterality: Bilateral;  Request to follow 2nd case in [place] block requests one hour  . WISDOM TOOTH EXTRACTION     x4  . WRIST SURGERY Right 07-23-2013    FAMILY HISTORY: Family History  Problem Relation Age of Onset  . Depression Mother        chronic  . Hearing loss Mother    . Hyperlipidemia Mother   . Vision loss Mother        beachets disease  . COPD Father   . Diabetes Father   . Cancer Maternal Grandmother 47       breast cancer  . Parkinsonism Maternal Grandmother   . Heart disease Maternal  Grandmother   . Breast cancer Maternal Grandmother   . Alzheimer's disease Paternal Grandmother   . Heart disease Paternal Grandfather        Died age 49  . Nephrolithiasis Paternal Grandfather   . Heart disease Maternal Grandfather   . Cancer Other 60       breast  . Breast cancer Other     SOCIAL HISTORY: Social History   Socioeconomic History  . Marital status: Married    Spouse name: Not on file  . Number of children: 2  . Years of education: Not on file  . Highest education level: Not on file  Occupational History  . Occupation: In home health   Tobacco Use  . Smoking status: Current Every Day Smoker    Packs/day: 0.50    Years: 10.00    Pack years: 5.00    Types: Cigarettes    Last attempt to quit: 01/31/2012    Years since quitting: 8.6  . Smokeless tobacco: Never Used  . Tobacco comment: pt has decreased her use to 1-2 cigarettes per day   Vaping Use  . Vaping Use: Never used  Substance and Sexual Activity  . Alcohol use: Yes    Comment: occasionally  . Drug use: Never  . Sexual activity: Yes    Birth control/protection: Pill    Comment: 1st intercourse 34 yo-More than 5 partners  Other Topics Concern  . Not on file  Social History Narrative   Lives with husband and twin sons    Social Determinants of Health   Financial Resource Strain:   . Difficulty of Paying Living Expenses: Not on file  Food Insecurity:   . Worried About Charity fundraiser in the Last Year: Not on file  . Ran Out of Food in the Last Year: Not on file  Transportation Needs:   . Lack of Transportation (Medical): Not on file  . Lack of Transportation (Non-Medical): Not on file  Physical Activity:   . Days of Exercise per Week: Not on file  . Minutes  of Exercise per Session: Not on file  Stress:   . Feeling of Stress : Not on file  Social Connections:   . Frequency of Communication with Friends and Family: Not on file  . Frequency of Social Gatherings with Friends and Family: Not on file  . Attends Religious Services: Not on file  . Active Member of Clubs or Organizations: Not on file  . Attends Archivist Meetings: Not on file  . Marital Status: Not on file  Intimate Partner Violence:   . Fear of Current or Ex-Partner: Not on file  . Emotionally Abused: Not on file  . Physically Abused: Not on file  . Sexually Abused: Not on file      PHYSICAL EXAM  Vitals:   09/30/20 0955  BP: (!) 144/95  Pulse: 100  Weight: 178 lb (80.7 kg)  Height: 5\' 7"  (1.702 m)   Body mass index is 27.88 kg/m.  Generalized: Well developed, in no acute distress  Cardiology: normal rate and rhythm, no murmur noted Respiratory: clear to auscultation bilaterally  Neurological examination  Mentation: Alert oriented to time, place, history taking. Follows all commands speech and language fluent Cranial nerve II-XII: Pupils were equal round reactive to light. Extraocular movements were full, visual field were full  Motor: The motor testing reveals 5 over 5 strength of all 4 extremities. Good symmetric motor tone is noted throughout.   Gait and station: Gait  is normal.  Reflexes: Deep tendon reflexes are decreased but symmetric bilaterally.   DIAGNOSTIC DATA (LABS, IMAGING, TESTING) - I reviewed patient records, labs, notes, testing and imaging myself where available.  No flowsheet data found.   Lab Results  Component Value Date   WBC 12.8 (H) 10/24/2019   HGB 13.2 10/24/2019   HCT 39.5 10/24/2019   MCV 97.8 10/24/2019   PLT 254 10/24/2019      Component Value Date/Time   NA 141 12/07/2018 0524   K 3.9 12/07/2018 0524   CL 113 (H) 12/07/2018 0524   CO2 21 (L) 12/07/2018 0524   GLUCOSE 92 12/07/2018 0524   BUN 6 12/07/2018 0524    CREATININE 0.58 12/07/2018 0524   CALCIUM 8.9 12/07/2018 0524   PROT 6.6 12/07/2018 0524   ALBUMIN 3.1 (L) 12/07/2018 0524   AST 33 12/07/2018 0524   ALT 29 12/07/2018 0524   ALKPHOS 40 12/07/2018 0524   BILITOT 0.7 12/07/2018 0524   GFRNONAA >60 12/07/2018 0524   GFRAA >60 12/07/2018 0524   No results found for: CHOL, HDL, LDLCALC, LDLDIRECT, TRIG, CHOLHDL Lab Results  Component Value Date   HGBA1C 4.9 11/16/2018   Lab Results  Component Value Date   VITAMINB12 205 (L) 02/13/2019   Lab Results  Component Value Date   TSH 1.618 12/06/2018       ASSESSMENT AND PLAN 34 y.o. year old female  has a past medical history of Abnormal Pap smear, ADD (attention deficit disorder), Anxiety, Guillain Barr syndrome ([place]), Headache(784.0), HSV-1 infection, IBS (irritable bowel syndrome), Knee hyperextension injury (2020), and Tachycardia. here with     ICD-10-CM   1. Polyneuropathy  G62.9       Kennadi is doing fairly well. Neuropathy pain continues to be her biggest complaint, however, it has not worsened. Some days are better than others. We will continue Lyrica 200mg  TID. We will restart oxcarpazepine 150mg  twice daily. She will start 150mg  daily for one week then increase dose if well tolerated. Continue neuropathy cream. May try alpha lipoic acid OTC. She was encouraged to continue healthy, active lifestyle. She was commended for continued alcohol cessation. She will follow up with me in 6 months, sooner if needed.    No orders of the defined types were placed in this encounter.    Meds ordered this encounter  Medications  . OXcarbazepine (TRILEPTAL) 150 MG tablet    Sig: Take 1 tablet (150 mg total) by mouth 2 (two) times daily.    Dispense:  60 tablet    Refill:  11    Order Specific Question:   Supervising Provider    Answer:   Melvenia Beam [id number]      I spent 20 minutes with the patient. 50% of this time was spent counseling and educating patient on plan of  care and medications.    Debbora Presto, FNP-C 09/30/2020, 10:48 AM [facility] [address], [place], [place] [address] [phone]   Made any corrections needed, and agree with history, physical, neuro exam,assessment and plan as stated.     Sarina Ill, MD [facility]

## 2021-03-29 NOTE — Patient Instructions (Incomplete)
Below is our plan:  We will ***  Please make sure you are staying well hydrated. I recommend 50-60 ounces daily. Well balanced diet and regular exercise encouraged. Consistent sleep schedule with 6-8 hours recommended.   Please continue follow up with care team as directed.   Follow up with *** in ***  You may receive a survey regarding today's visit. I encourage you to leave honest feed back as I do use this information to improve patient care. Thank you for seeing me today!     Peripheral Neuropathy Peripheral neuropathy is a type of nerve damage. It affects nerves that carry signals between the spinal cord and the arms, legs, and the rest of the body (peripheral nerves). It does not affect nerves in the spinal cord or brain. In peripheral neuropathy, one nerve or a group of nerves may be damaged. Peripheral neuropathy is a broad category that includes many specific nerve disorders, like diabetic neuropathy, hereditary neuropathy, and carpal tunnel syndrome. What are the causes? This condition may be caused by:  Diabetes. This is the most common cause of peripheral neuropathy.  Nerve injury.  Pressure or stress on a nerve that lasts a long time.  Lack (deficiency) of B vitamins. This can result from alcoholism, poor diet, or a restricted diet.  Infections.  Autoimmune diseases, such as rheumatoid arthritis and systemic lupus erythematosus.  Nerve diseases that are passed from parent to child (inherited).  Some medicines, such as cancer medicines (chemotherapy).  Poisonous (toxic) substances, such as lead and mercury.  Too little blood flowing to the legs.  Kidney disease.  Thyroid disease. In some cases, the cause of this condition is not known. What are the signs or symptoms? Symptoms of this condition depend on which of your nerves is damaged. Common symptoms include:  Loss of feeling (numbness) in the feet, hands, or both.  Tingling in the feet, hands, or  both.  Burning pain.  Very sensitive skin.  Weakness.  Not being able to move a part of the body (paralysis).  Muscle twitching.  Clumsiness or poor coordination.  Loss of balance.  Not being able to control your bladder.  Feeling dizzy.  Sexual problems. How is this diagnosed? Diagnosing and finding the cause of peripheral neuropathy can be difficult. Your health care provider will take your medical history and do a physical exam. A neurological exam will also be done. This involves checking things that are affected by your brain, spinal cord, and nerves (nervous system). For example, your health care provider will check your reflexes, how you move, and what you can feel. You may have other tests, such as:  Blood tests.  Electromyogram (EMG) and nerve conduction tests. These tests check nerve function and how well the nerves are controlling the muscles.  Imaging tests, such as CT scans or MRI to rule out other causes of your symptoms.  Removing a small piece of nerve to be examined in a lab (nerve biopsy).  Removing and examining a small amount of the fluid that surrounds the brain and spinal cord (lumbar puncture). How is this treated? Treatment for this condition may involve:  Treating the underlying cause of the neuropathy, such as diabetes, kidney disease, or vitamin deficiencies.  Stopping medicines that can cause neuropathy, such as chemotherapy.  Medicine to help relieve pain. Medicines may include: ? Prescription or over-the-counter pain medicine. ? Antiseizure medicine. ? Antidepressants. ? Pain-relieving patches that are applied to painful areas of skin.  Surgery to relieve pressure  on a nerve or to destroy a nerve that is causing pain.  Physical therapy to help improve movement and balance.  Devices to help you move around (assistive devices). Follow these instructions at home: Medicines  Take over-the-counter and prescription medicines only as told  by your health care provider. Do not take any other medicines without first asking your health care provider.  Do not drive or use heavy machinery while taking prescription pain medicine. Lifestyle  Do not use any products that contain nicotine or tobacco, such as cigarettes and e-cigarettes. Smoking keeps blood from reaching damaged nerves. If you need help quitting, ask your health care provider.  Avoid or limit alcohol. Too much alcohol can cause a vitamin B deficiency, and vitamin B is needed for healthy nerves.  Eat a healthy diet. This includes: ? Eating foods that are high in fiber, such as fresh fruits and vegetables, whole grains, and beans. ? Limiting foods that are high in fat and processed sugars, such as fried or sweet foods.   General instructions  If you have diabetes, work closely with your health care provider to keep your blood sugar under control.  If you have numbness in your feet: ? Check every day for signs of injury or infection. Watch for redness, warmth, and swelling. ? Wear padded socks and comfortable shoes. These help protect your feet.  Develop a good support system. Living with peripheral neuropathy can be stressful. Consider talking with a mental health specialist or joining a support group.  Use assistive devices and attend physical therapy as told by your health care provider. This may include using a walker or a cane.  Keep all follow-up visits as told by your health care provider. This is important.   Contact a health care provider if:  You have new signs or symptoms of peripheral neuropathy.  You are struggling emotionally from dealing with peripheral neuropathy.  Your pain is not well-controlled. Get help right away if:  You have an injury or infection that is not healing normally.  You develop new weakness in an arm or leg.  You have fallen or do so frequently. Summary  Peripheral neuropathy is when the nerves in the arms, or legs are  damaged, resulting in numbness, weakness, or pain.  There are many causes of peripheral neuropathy, including diabetes, pinched nerves, vitamin deficiencies, autoimmune disease, and hereditary conditions.  Diagnosing and finding the cause of peripheral neuropathy can be difficult. Your health care provider will take your medical history, do a physical exam, and do tests, including blood tests and nerve function tests.  Treatment involves treating the underlying cause of the neuropathy and taking medicines to help control pain. Physical therapy and assistive devices may also help. This information is not intended to replace advice given to you by your health care provider. Make sure you discuss any questions you have with your health care provider. Document Revised: 08/04/2020 Document Reviewed: 08/04/2020 Elsevier Patient Education  2021 [employer].

## 2021-03-29 NOTE — Progress Notes (Deleted)
PATIENT: Kimberly [name] DOB: [birth year]  REASON FOR VISIT: follow up HISTORY FROM: patient  No chief complaint on file.    HISTORY OF PRESENT ILLNESS: 03/29/21 ALL: She returns for follow up for polyneuropathy. She continues Lyrica 200mg  TID and oxycarbazepine 150mg  BID.    09/30/2020 ALL:  Kimberly [name] is a 35 y.o. female here today for follow up. We started oxcarbazepine 150mg  twice daily in 03/2020. She took medication for about a month and feels that it worked well but did not continue medication. She tolerated medication well. She continues Lyrica 200mg  TID. Last refill 08/21/2020 for 90 day supply. Neuropathy is stable. No worsening since last visit.    History (from previous notes)  Kimberly [name] is a 35 y.o. female here today for follow up. She continues to have burning pain, tingling and numbness or both shins and feet. Fingers are still numb and tingly. She does not feel there is any improvement overall, but does feel that some days are better than others. No weakness. She has had a couple of mechanical falls but denies injuries. She continues Lyrica 200mg  TID. She tried duloxetine but states that she did not like how she felt on it. She felt disconnected and sleepy. She did get the compounded neuropathy cream that she feels helps temporarily. CBD oil helps too. No vision changes, no double vision. She did get an eye exam at [facility] and was told that that she needed new glasses but otherwise everything looked ok.    HISTORY: (copied from my note on 09/25/2019)  Kimberly [name] is a 35 y.o. female here today for follow up post GB diagnosis in 11/2018. She was advised to increase Lyrica to 200mg  TID at last visit in 03/2019. If she misses a dose she definitely notes worsening but continues to have significant tingling and sharp pains in bilateral feet and calves. She also notes continues numbness of bilateral hands (mostly thumb and first finger bilaterally). She will  take Tramadol 50mg  as needed that she feels helps significantly. She has not tried duloxetine in the past. No other antidepressants. Weakness has improved significantly. She feels that she is clumsy. She reports multiple falls. She did very well with PT in March but does not feel she has time to work with PT right now. She broke left ankle in 05/2019. She rolled her ankle while walking. She initially felt pain but then states pain resolved. Swelling continued which prompted orthopedic visit. She reports being in a cast for several weeks.  She has gained 30 pounds this year. She is very active with her twins (37 year old). No vision changes or returning double vision. She did not see Dr [name]. She states that their office didn't call her. She reports drinking "every now and then." She last drank alcohol at her brother's wedding this past weekend. Drink of choice is liquor.    HISTORY: (copied from Dr [name] note on 03/25/2019)  03/25/2019: She can scratch her calf and feel her toe, strange sensory perceptions. Some days she has some feeling and then it goes away. Discussed this could be the nerves regenerating. She still has some nerve pain but improving, but she has pain in the arches of her feet and keeps cotton balls between her toes because it hurts for them to rub together. lyrica helps, will split it tid for better coverage.She feels great, she was hiking in the woods and swimming with her kids, she had a knee injury and  the sensory changes bother her but feels doing well. Will change lyrice to 200mg  tid for better coverage. F/u 3 months  Interval history 02/12/2019: Patient with a prior history of opioid abuse, GBS recently this year admitted for IVIG then discharged, admitted again for possible thiamine deficiency. She is feeling better. Overall she is improving. She is still numb from below her breast, her legs are numb and lower legs and feet and nerve pain.She can't feel her toes and fingers. She  graduated from PT. She has 37 year old twin boys. She has started riding a bike. She has been losing hair. This is not my specialty, I'm not aware Lyrica can do this but unsure, may be stress, advised to discuss with pcp because this is not my area of expertise. She has bald spots, recommended pcp and possibly derm. She has double vision, started in the hospital the first time but worsening. She will see objects side by side, worse up closer, closing left eye makes it go away. Her family thinks she is drunk a lot due to her walking and she sounds and looks intoxicated. These symptoms have been stable for 4 weeks.She has not been taking her B1 supplement because it was too high and then it was normal. Speech Therapy.   Interval History: She is doing well. She was discharged from the hospital about 6 days ago. She was readmitted for concerns of thiamin deficiency. She is slowly regaining feeling. She can feel in bilateral feet and hands. She can feel pressure most everywhere else with exception of her abdomen. She continues thiamin, gabapentin and Lyrica. She does continue to have numbness, tingling and shooting pains. She is working with PT. She has finished therapy with OT. She is requesting a handicap placard and refill of her oxycodone-acetaminophen. She continues this q6hr for pain.   PIR:[name] A Bevilleis a 35 y.o.femalehere as requested by Dr. Unknown Jim numbness. PMHx depression. Swelling in the feet and legs for several months, placed on diuretic around the end of the year, she became dehydrated. The pain started when the swelling reduced. She was significantly swollen, she "didn't have ankles", then they went numb in the area of the swelling. But then it started moving up the legs 5 days ago and now is up to her chest and her left shoulder and arm is tingly but she is numb. Now her mouth is numb. She has "serious weakness", she can barely walk or hold onto herself a lot of times. When she is  walking she feels like her legs are going to come out from beneath her. She has not fallen. Still progressing. She is tight when she lays down flat but she cant feel anything from the neck down.   Reviewed notes, labs and imaging from outside physicians, which showed:  Patient presented on 11/06/2018 with bilateral leg pain and swelling of the ankles and feet. Reviewed Dr. Delanna Ahmadi notes. She was put on Lasix and had hypokalemia. She was prescribed potassium. Went to ED and diagnosed with dehydration. Urine culture positive for E. coli but patient denied treatment was told UA in hospital normal. Labs also showed elevated MCV of 106. Prior history of alcoholism treated 2 years ago and denies current use. Believes she was tested for B12 and folate no lab results in chart TSH was normal in July 2019. Still complaining of nausea and vomiting.TSH 0.78. B12, folate was drawn no results yet.     REVIEW OF SYSTEMS: Out of a complete 14 system  review of symptoms, the patient complains only of the following symptoms, neuropathy and all other reviewed systems are negative.   ALLERGIES: Allergies  Allergen Reactions  . Tape Itching and Other (See Comments)    Clear, "plastic" tape is the culprit    HOME MEDICATIONS: Outpatient Medications Prior to Visit  Medication Sig Dispense Refill  . ALPRAZolam (XANAX) 0.5 MG tablet Take 0.5 mg by mouth 3 (three) times daily.    . calcium carbonate (TUMS EX) 750 MG chewable tablet Chew 1-2 tablets by mouth as needed (for heartburn or indigestion).    Marland Kitchen escitalopram (LEXAPRO) 10 MG tablet Take 10 mg by mouth daily.    . hydrocortisone cream (PREPARATION H) 1 % Apply 1 application topically as needed for itching. 30 g 1  . ibuprofen (ADVIL,MOTRIN) 200 MG tablet Take 800 mg by mouth every 6 (six) hours as needed for mild pain or moderate pain.     Marland Kitchen loperamide (IMODIUM) 2 MG capsule Take 2 mg by mouth as needed for diarrhea or loose stools.     .  ondansetron (ZOFRAN) 4 MG tablet Take every 4-6 hours as needed. 30 tablet 1  . OXcarbazepine (TRILEPTAL) 150 MG tablet Take 1 tablet (150 mg total) by mouth 2 (two) times daily. 60 tablet 11  . pantoprazole (PROTONIX) 40 MG tablet TAKE 1 TABLET BY MOUTH DAILY. TAKE 30-60 MINUTES BEFORE BREAKFAST 90 tablet 0  . pregabalin (LYRICA) 200 MG capsule Take 1 capsule (200 mg total) by mouth 3 (three) times daily. 270 capsule 1   No facility-administered medications prior to visit.    PAST MEDICAL HISTORY: Past Medical History:  Diagnosis Date  . Abnormal Pap smear   . ADD (attention deficit disorder)   . Anxiety   . Guillain Barr syndrome ([place])   . Headache(784.0)   . HSV-1 infection   . IBS (irritable bowel syndrome)   . Knee hyperextension injury 2020   Left, being treated by ortho  . Tachycardia     PAST SURGICAL HISTORY: Past Surgical History:  Procedure Laterality Date  . BREAST BIOPSY Right   . BREAST SURGERY     Biopsy-benign  . CESAREAN SECTION  09/13/2012   Procedure: CESAREAN SECTION;  Surgeon: Lavonia Drafts, MD;  Location: [facility];  Service: Obstetrics;  Laterality: N/A;  . cortisone shot R wrist Right 2020  . KYPHOPLASTY N/A 08/27/2013   Procedure: Thoracic Twelve Kyphoplasty;  Surgeon: Erline Levine, MD;  Location: [facility];  Service: Neurosurgery;  Laterality: N/A;  T12 Kyphoplasty  . LAPAROSCOPIC TUBAL LIGATION Bilateral 10/21/2019   Procedure: LAPAROSCOPIC TUBAL LIGATION WITH CAUTERY;  Surgeon: Princess Bruins, MD;  Location: [facility];  Service: Gynecology;  Laterality: Bilateral;  Request to follow 2nd case in [place] block requests one hour  . WISDOM TOOTH EXTRACTION     x4  . WRIST SURGERY Right 07-23-2013    FAMILY HISTORY: Family History  Problem Relation Age of Onset  . Depression Mother        chronic  . Hearing loss Mother   . Hyperlipidemia Mother   . Vision loss Mother        beachets disease  . COPD Father    . Diabetes Father   . Cancer Maternal Grandmother 74       breast cancer  . Parkinsonism Maternal Grandmother   . Heart disease Maternal Grandmother   . Breast cancer Maternal Grandmother   . Alzheimer's disease Paternal Grandmother   . Heart disease  Paternal Grandfather        Died age 37  . Nephrolithiasis Paternal Grandfather   . Heart disease Maternal Grandfather   . Cancer Other 60       breast  . Breast cancer Other     SOCIAL HISTORY: Social History   Socioeconomic History  . Marital status: Married    Spouse name: Not on file  . Number of children: 2  . Years of education: Not on file  . Highest education level: Not on file  Occupational History  . Occupation: In home health   Tobacco Use  . Smoking status: Current Every Day Smoker    Packs/day: 0.50    Years: 10.00    Pack years: 5.00    Types: Cigarettes    Last attempt to quit: 01/31/2012    Years since quitting: 9.1  . Smokeless tobacco: Never Used  . Tobacco comment: pt has decreased her use to 1-2 cigarettes per day   Vaping Use  . Vaping Use: Never used  Substance and Sexual Activity  . Alcohol use: Yes    Comment: occasionally  . Drug use: Never  . Sexual activity: Yes    Birth control/protection: Pill    Comment: 1st intercourse 35 yo-More than 5 partners  Other Topics Concern  . Not on file  Social History Narrative   Lives with husband and twin sons    Social Determinants of Health   Financial Resource Strain: Not on file  Food Insecurity: Not on file  Transportation Needs: Not on file  Physical Activity: Not on file  Stress: Not on file  Social Connections: Not on file  Intimate Partner Violence: Not on file      PHYSICAL EXAM  There were no vitals filed for this visit. There is no height or weight on file to calculate BMI.  Generalized: Well developed, in no acute distress  Cardiology: normal rate and rhythm, no murmur noted Respiratory: clear to auscultation bilaterally   Neurological examination  Mentation: Alert oriented to time, place, history taking. Follows all commands speech and language fluent Cranial nerve II-XII: Pupils were equal round reactive to light. Extraocular movements were full, visual field were full  Motor: The motor testing reveals 5 over 5 strength of all 4 extremities. Good symmetric motor tone is noted throughout.   Gait and station: Gait is normal.  Reflexes: Deep tendon reflexes are decreased but symmetric bilaterally.   DIAGNOSTIC DATA (LABS, IMAGING, TESTING) - I reviewed patient records, labs, notes, testing and imaging myself where available.  No flowsheet data found.   Lab Results  Component Value Date   WBC 12.8 (H) 10/24/2019   HGB 13.2 10/24/2019   HCT 39.5 10/24/2019   MCV 97.8 10/24/2019   PLT 254 10/24/2019      Component Value Date/Time   NA 141 12/07/2018 0524   K 3.9 12/07/2018 0524   CL 113 (H) 12/07/2018 0524   CO2 21 (L) 12/07/2018 0524   GLUCOSE 92 12/07/2018 0524   BUN 6 12/07/2018 0524   CREATININE 0.58 12/07/2018 0524   CALCIUM 8.9 12/07/2018 0524   PROT 6.6 12/07/2018 0524   ALBUMIN 3.1 (L) 12/07/2018 0524   AST 33 12/07/2018 0524   ALT 29 12/07/2018 0524   ALKPHOS 40 12/07/2018 0524   BILITOT 0.7 12/07/2018 0524   GFRNONAA >60 12/07/2018 0524   GFRAA >60 12/07/2018 0524   No results found for: CHOL, HDL, LDLCALC, LDLDIRECT, TRIG, CHOLHDL Lab Results  Component Value Date  HGBA1C 4.9 11/16/2018   Lab Results  Component Value Date   VITAMINB12 205 (L) 02/13/2019   Lab Results  Component Value Date   TSH 1.618 12/06/2018       ASSESSMENT AND PLAN 35 y.o. year old female  has a past medical history of Abnormal Pap smear, ADD (attention deficit disorder), Anxiety, Guillain Barr syndrome ([place]), Headache(784.0), HSV-1 infection, IBS (irritable bowel syndrome), Knee hyperextension injury (2020), and Tachycardia. here with     ICD-10-CM   1. Polyneuropathy  G62.9       Zykia  is doing fairly well. Neuropathy pain continues to be her biggest complaint, however, it has not worsened. Some days are better than others. We will continue Lyrica 200mg  TID. We will restart oxcarpazepine 150mg  twice daily. She will start 150mg  daily for one week then increase dose if well tolerated. Continue neuropathy cream. May try alpha lipoic acid OTC. She was encouraged to continue healthy, active lifestyle. She was commended for continued alcohol cessation. She will follow up with me in 6 months, sooner if needed.    No orders of the defined types were placed in this encounter.    No orders of the defined types were placed in this encounter.      Debbora Presto, FNP-C 03/29/2021, 11:27 AM [facility] [address] [place], [place] [address] [phone]

## 2021-03-30 ENCOUNTER — Ambulatory Visit: Payer: PRIVATE HEALTH INSURANCE | Admitting: Family Medicine

## 2021-05-27 ENCOUNTER — Other Ambulatory Visit: Payer: Self-pay | Admitting: Internal Medicine

## 2021-05-27 ENCOUNTER — Other Ambulatory Visit (HOSPITAL_COMMUNITY): Payer: Self-pay | Admitting: Internal Medicine

## 2021-05-27 DIAGNOSIS — R945 Abnormal results of liver function studies: Secondary | ICD-10-CM

## 2021-06-03 ENCOUNTER — Ambulatory Visit (HOSPITAL_COMMUNITY): Admission: RE | Admit: 2021-06-03 | Payer: No Typology Code available for payment source | Source: Ambulatory Visit

## 2021-06-03 ENCOUNTER — Encounter (HOSPITAL_COMMUNITY): Payer: Self-pay

## 2021-06-08 ENCOUNTER — Ambulatory Visit (HOSPITAL_COMMUNITY)
Admission: RE | Admit: 2021-06-08 | Discharge: 2021-06-08 | Disposition: A | Discharge status: Home / Self Care | Payer: No Typology Code available for payment source | Source: Ambulatory Visit | Attending: Internal Medicine | Admitting: Internal Medicine

## 2021-06-08 ENCOUNTER — Other Ambulatory Visit: Payer: Self-pay

## 2021-06-08 DIAGNOSIS — R945 Abnormal results of liver function studies: Secondary | ICD-10-CM | POA: Insufficient documentation

## 2021-11-09 DIAGNOSIS — L659 Nonscarring hair loss, unspecified: Secondary | ICD-10-CM | POA: Diagnosis not present

## 2021-11-09 DIAGNOSIS — R634 Abnormal weight loss: Secondary | ICD-10-CM | POA: Diagnosis not present

## 2021-11-09 DIAGNOSIS — K219 Gastro-esophageal reflux disease without esophagitis: Secondary | ICD-10-CM | POA: Diagnosis not present

## 2021-11-09 DIAGNOSIS — Z6820 Body mass index (BMI) 20.0-20.9, adult: Secondary | ICD-10-CM | POA: Diagnosis not present

## 2021-11-09 DIAGNOSIS — F419 Anxiety disorder, unspecified: Secondary | ICD-10-CM | POA: Diagnosis not present

## 2021-11-09 DIAGNOSIS — Z23 Encounter for immunization: Secondary | ICD-10-CM | POA: Diagnosis not present

## 2021-11-18 DIAGNOSIS — N390 Urinary tract infection, site not specified: Secondary | ICD-10-CM | POA: Diagnosis not present

## 2021-11-29 ENCOUNTER — Encounter (INDEPENDENT_AMBULATORY_CARE_PROVIDER_SITE_OTHER): Payer: Self-pay | Admitting: *Deleted

## 2021-11-30 DIAGNOSIS — N390 Urinary tract infection, site not specified: Secondary | ICD-10-CM | POA: Diagnosis not present

## 2021-12-02 ENCOUNTER — Telehealth: Payer: Self-pay | Admitting: Family Medicine

## 2021-12-02 ENCOUNTER — Other Ambulatory Visit: Payer: Self-pay

## 2021-12-02 ENCOUNTER — Inpatient Hospital Stay (HOSPITAL_COMMUNITY)
Admission: EM | Admit: 2021-12-02 | Discharge: 2021-12-09 | DRG: 074 | Disposition: A | Discharge status: Home / Self Care | Payer: BC Managed Care – PPO | Attending: Internal Medicine | Admitting: Internal Medicine

## 2021-12-02 DIAGNOSIS — G629 Polyneuropathy, unspecified: Secondary | ICD-10-CM | POA: Insufficient documentation

## 2021-12-02 DIAGNOSIS — F17210 Nicotine dependence, cigarettes, uncomplicated: Secondary | ICD-10-CM | POA: Diagnosis present

## 2021-12-02 DIAGNOSIS — E5111 Dry beriberi: Secondary | ICD-10-CM | POA: Diagnosis not present

## 2021-12-02 DIAGNOSIS — R531 Weakness: Secondary | ICD-10-CM | POA: Diagnosis not present

## 2021-12-02 DIAGNOSIS — Z20822 Contact with and (suspected) exposure to covid-19: Secondary | ICD-10-CM | POA: Diagnosis not present

## 2021-12-02 DIAGNOSIS — Z79899 Other long term (current) drug therapy: Secondary | ICD-10-CM

## 2021-12-02 DIAGNOSIS — G610 Guillain-Barre syndrome: Secondary | ICD-10-CM | POA: Diagnosis not present

## 2021-12-02 DIAGNOSIS — R9431 Abnormal electrocardiogram [ECG] [EKG]: Secondary | ICD-10-CM | POA: Diagnosis not present

## 2021-12-02 DIAGNOSIS — F32A Depression, unspecified: Secondary | ICD-10-CM | POA: Diagnosis present

## 2021-12-02 DIAGNOSIS — Z8669 Personal history of other diseases of the nervous system and sense organs: Secondary | ICD-10-CM | POA: Diagnosis not present

## 2021-12-02 DIAGNOSIS — E538 Deficiency of other specified B group vitamins: Secondary | ICD-10-CM | POA: Diagnosis present

## 2021-12-02 DIAGNOSIS — E441 Mild protein-calorie malnutrition: Secondary | ICD-10-CM | POA: Diagnosis not present

## 2021-12-02 DIAGNOSIS — R197 Diarrhea, unspecified: Secondary | ICD-10-CM | POA: Diagnosis not present

## 2021-12-02 DIAGNOSIS — Z6820 Body mass index (BMI) 20.0-20.9, adult: Secondary | ICD-10-CM

## 2021-12-02 DIAGNOSIS — F419 Anxiety disorder, unspecified: Secondary | ICD-10-CM | POA: Diagnosis not present

## 2021-12-02 DIAGNOSIS — K219 Gastro-esophageal reflux disease without esophagitis: Secondary | ICD-10-CM | POA: Diagnosis present

## 2021-12-02 DIAGNOSIS — N390 Urinary tract infection, site not specified: Secondary | ICD-10-CM | POA: Diagnosis not present

## 2021-12-02 DIAGNOSIS — Z818 Family history of other mental and behavioral disorders: Secondary | ICD-10-CM

## 2021-12-02 DIAGNOSIS — R202 Paresthesia of skin: Secondary | ICD-10-CM | POA: Diagnosis present

## 2021-12-02 DIAGNOSIS — R200 Anesthesia of skin: Secondary | ICD-10-CM | POA: Diagnosis not present

## 2021-12-02 DIAGNOSIS — E876 Hypokalemia: Secondary | ICD-10-CM | POA: Diagnosis present

## 2021-12-02 DIAGNOSIS — Z91048 Other nonmedicinal substance allergy status: Secondary | ICD-10-CM

## 2021-12-02 LAB — CBC WITH DIFFERENTIAL/PLATELET
Abs Immature Granulocytes: 0.08 10*3/uL — ABNORMAL HIGH (ref 0.00–0.07)
Basophils Absolute: 0.1 10*3/uL (ref 0.0–0.1)
Basophils Relative: 1 %
Eosinophils Absolute: 0.3 10*3/uL (ref 0.0–0.5)
Eosinophils Relative: 2 %
HCT: 44 % (ref 36.0–46.0)
Hemoglobin: 15.3 g/dL — ABNORMAL HIGH (ref 12.0–15.0)
Immature Granulocytes: 1 %
Lymphocytes Relative: 26 %
Lymphs Abs: 2.7 10*3/uL (ref 0.7–4.0)
MCH: 35.4 pg — ABNORMAL HIGH (ref 26.0–34.0)
MCHC: 34.8 g/dL (ref 30.0–36.0)
MCV: 101.9 fL — ABNORMAL HIGH (ref 80.0–100.0)
Monocytes Absolute: 0.7 10*3/uL (ref 0.1–1.0)
Monocytes Relative: 7 %
Neutro Abs: 6.5 10*3/uL (ref 1.7–7.7)
Neutrophils Relative %: 63 %
Platelets: 309 10*3/uL (ref 150–400)
RBC: 4.32 MIL/uL (ref 3.87–5.11)
RDW: 12.9 % (ref 11.5–15.5)
WBC: 10.3 10*3/uL (ref 4.0–10.5)
nRBC: 0 % (ref 0.0–0.2)

## 2021-12-02 LAB — CSF CELL COUNT WITH DIFFERENTIAL
RBC Count, CSF: 0 /mm3
RBC Count, CSF: 1 /mm3 — ABNORMAL HIGH
Tube #: 1
Tube #: 4
WBC, CSF: 0 /mm3 (ref 0–5)
WBC, CSF: 1 /mm3 (ref 0–5)

## 2021-12-02 LAB — PROTEIN, CSF: Total  Protein, CSF: 18 mg/dL (ref 15–45)

## 2021-12-02 LAB — GLUCOSE, CSF: Glucose, CSF: 85 mg/dL — ABNORMAL HIGH (ref 40–70)

## 2021-12-02 LAB — I-STAT CHEM 8, ED
BUN: <3 mg/dL — ABNORMAL LOW (ref 6–20)
Calcium, Ion: 1.07 mmol/L — ABNORMAL LOW (ref 1.15–1.40)
Chloride: 101 mmol/L (ref 98–111)
Creatinine, Ser: 0.4 mg/dL — ABNORMAL LOW (ref 0.44–1.00)
Glucose, Bld: 96 mg/dL (ref 70–99)
HCT: 47 % — ABNORMAL HIGH (ref 36.0–46.0)
Hemoglobin: 16 g/dL — ABNORMAL HIGH (ref 12.0–15.0)
Potassium: 2.8 mmol/L — ABNORMAL LOW (ref 3.5–5.1)
Sodium: 136 mmol/L (ref 135–145)
TCO2: 21 mmol/L — ABNORMAL LOW (ref 22–32)

## 2021-12-02 LAB — TSH: TSH: 2.548 u[IU]/mL (ref 0.350–4.500)

## 2021-12-02 LAB — MAGNESIUM: Magnesium: 1.6 mg/dL — ABNORMAL LOW (ref 1.7–2.4)

## 2021-12-02 MED ORDER — MAGNESIUM SULFATE 2 GM/50ML IV SOLN
2.0000 g | Freq: Once | INTRAVENOUS | Status: AC
Start: 2021-12-02 — End: 2021-12-02
  Administered 2021-12-02: 2 g via INTRAVENOUS
  Filled 2021-12-02: qty 50

## 2021-12-02 MED ORDER — ACETAMINOPHEN 325 MG PO TABS
650.0000 mg | ORAL_TABLET | Freq: Once | ORAL | Status: AC
  Administered 2021-12-02: 650 mg via ORAL
  Filled 2021-12-02: qty 2

## 2021-12-02 MED ORDER — GABAPENTIN 300 MG PO CAPS
300.0000 mg | ORAL_CAPSULE | Freq: Once | ORAL | Status: AC
Start: 2021-12-02 — End: 2021-12-02
  Administered 2021-12-02: 300 mg via ORAL
  Filled 2021-12-02: qty 1

## 2021-12-02 MED ORDER — POTASSIUM CHLORIDE CRYS ER 20 MEQ PO TBCR
20.0000 meq | EXTENDED_RELEASE_TABLET | Freq: Once | ORAL | Status: AC
  Administered 2021-12-02: 20 meq via ORAL
  Filled 2021-12-02: qty 1

## 2021-12-02 MED ORDER — KETOROLAC TROMETHAMINE 15 MG/ML IJ SOLN
15.0000 mg | Freq: Once | INTRAMUSCULAR | Status: AC
  Administered 2021-12-02: 15 mg via INTRAVENOUS
  Filled 2021-12-02: qty 1

## 2021-12-02 NOTE — Telephone Encounter (Signed)
Received a call from Dr Gerarda Fraction with [facility] regarding patient reporting "rapidly progressing symptoms" similar to those when originally diagnosed with GB in 11/2018. She reported numbness and weakness of torso to lower extremities and difficulty walking (MD reported she walked into office without difficulty). Symptoms started abruptly 2 days ago. Exam concerning for absent lower reflexes but otherwise unremarkable. No acute respiratory symptoms. I have advised that he direct her to the nearest ER for urgent evaluation. May need MRI, LP and blood work. I will notify Dr Jaynee Eagles upon her return to the office. We will follow up closely with patient following ER evaluation.

## 2021-12-02 NOTE — ED Provider Notes (Signed)
[facility] Emergency Department Provider Note MRN:  [id number]  Arrival date & time: 12/03/21     Chief Complaint   Numbness   History of Present Illness   Kimberly [name] is a 36 y.o. year-old female with a history of Guillain-Barr, dry beriberi presenting to the ED with chief complaint of progressive weakness and numbness.  Patient reports that she was in her usual state of health until the past 2 weeks.  During the past 2 weeks the patient states that she has had a viral infection and also has received her flu vaccine.  Patient states that 2 days ago she woke up and was feeling weak in her lower extremities this progressed when she woke up today and is now feeling weak in her upper extremities as well.  Patient states that she has difficulty ambulating.  Patient also states that she has diffuse numbness to the level of her umbilicus and over her bilateral hands.  Patient denies that she has had fever, chills, shortness of breath, abdominal pain, or chest pain.  Patient is currently being treated for urinary tract infection.  Review of Systems  A thorough review of systems was obtained and all systems are negative except as noted in the HPI and PMH.   Patient's Health History    Past Medical History:  Diagnosis Date   Abnormal Pap smear    ADD (attention deficit disorder)    Anxiety    Guillain Barr syndrome ([place])    Headache(784.0)    HSV-1 infection    IBS (irritable bowel syndrome)    Knee hyperextension injury 2020   Left, being treated by ortho   Tachycardia     Past Surgical History:  Procedure Laterality Date   BREAST BIOPSY Right    BREAST SURGERY     Biopsy-benign   CESAREAN SECTION  09/13/2012   Procedure: CESAREAN SECTION;  Surgeon: Lavonia Drafts, MD;  Location: [facility];  Service: Obstetrics;  Laterality: N/A;   cortisone shot R wrist Right 2020   KYPHOPLASTY N/A 08/27/2013   Procedure: Thoracic Twelve Kyphoplasty;   Surgeon: Erline Levine, MD;  Location: [facility];  Service: Neurosurgery;  Laterality: N/A;  T12 Kyphoplasty   LAPAROSCOPIC TUBAL LIGATION Bilateral 10/21/2019   Procedure: LAPAROSCOPIC TUBAL LIGATION WITH CAUTERY;  Surgeon: Princess Bruins, MD;  Location: [facility];  Service: Gynecology;  Laterality: Bilateral;  Request to follow 2nd case in [place] block requests one hour   WISDOM TOOTH EXTRACTION     x4   WRIST SURGERY Right 07-23-2013    Family History  Problem Relation Age of Onset   Depression Mother        chronic   Hearing loss Mother    Hyperlipidemia Mother    Vision loss Mother        beachets disease   COPD Father    Diabetes Father    Cancer Maternal Grandmother 54       breast cancer   Parkinsonism Maternal Grandmother    Heart disease Maternal Grandmother    Breast cancer Maternal Grandmother    Alzheimer's disease Paternal Grandmother    Heart disease Paternal Grandfather        Died age 6   Nephrolithiasis Paternal Grandfather    Heart disease Maternal Grandfather    Cancer Other 13       breast   Breast cancer Other     Social History   Socioeconomic History   Marital status: Married  Spouse name: Not on file   Number of children: 2   Years of education: Not on file   Highest education level: Not on file  Occupational History   Occupation: In home health   Tobacco Use   Smoking status: Every Day    Packs/day: 0.50    Years: 10.00    Pack years: 5.00    Types: Cigarettes    Last attempt to quit: 01/31/2012    Years since quitting: 9.8   Smokeless tobacco: Never   Tobacco comments:    pt has decreased her use to 1-2 cigarettes per day   Vaping Use   Vaping Use: Never used  Substance and Sexual Activity   Alcohol use: Yes    Comment: occasionally   Drug use: Never   Sexual activity: Yes    Birth control/protection: Pill    Comment: 1st intercourse 36 yo-More than 5 partners  Other Topics Concern   Not on file   Social History Narrative   Lives with husband and twin sons    Social Determinants of Health   Financial Resource Strain: Not on file  Food Insecurity: Not on file  Transportation Needs: Not on file  Physical Activity: Not on file  Stress: Not on file  Social Connections: Not on file  Intimate Partner Violence: Not on file     Physical Exam   Physical Exam Constitutional:      Appearance: She is well-developed. She is not ill-appearing.  HENT:     Head: Normocephalic and atraumatic.  Cardiovascular:     Rate and Rhythm: Normal rate and regular rhythm.     Pulses: Normal pulses.     Heart sounds: Normal heart sounds.  Pulmonary:     Effort: Pulmonary effort is normal.     Breath sounds: Normal breath sounds.  Abdominal:     General: Abdomen is flat. There is no distension.     Palpations: Abdomen is soft.     Tenderness: There is no abdominal tenderness.  Skin:    General: Skin is warm and dry.     Findings: No lesion or rash.  Neurological:     General: No focal deficit present.     Mental Status: She is alert and oriented to person, place, and time.     Sensory: Sensory deficit (General numbness to level of umbilicus) present.     Motor: Weakness (4/5 lower exstremity bilateral) present.     Comments: Unable to obtain reflexes from the areas affected by sensory deficits.  Psychiatric:        Mood and Affect: Mood normal.      Diagnostic and Interventional Summary    EKG Interpretation  Date/Time:  Thursday December 02 2021 19:11:41 EST Ventricular Rate:  97 PR Interval:  175 QRS Duration: 85 QT Interval:  370 QTC Calculation: 470 R Axis:   60 Text Interpretation: Sinus rhythm Low voltage, precordial leads Baseline wander Artifact Abnormal ECG Confirmed by Carmin Muskrat [phone]) on 12/02/2021 7:42:05 PM       Labs Reviewed  CBC WITH DIFFERENTIAL/PLATELET - Abnormal; Notable for the following components:      Result Value   Hemoglobin 15.3 (*)    MCV  101.9 (*)    MCH 35.4 (*)    Abs Immature Granulocytes 0.08 (*)    All other components within normal limits  MAGNESIUM - Abnormal; Notable for the following components:   Magnesium 1.6 (*)    All other components within normal limits  CSF CELL COUNT WITH DIFFERENTIAL - Abnormal; Notable for the following components:   RBC Count, CSF 1 (*)    All other components within normal limits  GLUCOSE, CSF - Abnormal; Notable for the following components:   Glucose, CSF 85 (*)    All other components within normal limits  I-STAT CHEM 8, ED - Abnormal; Notable for the following components:   Potassium 2.8 (*)    BUN <3 (*)    Creatinine, Ser 0.40 (*)    Calcium, Ion 1.07 (*)    TCO2 21 (*)    Hemoglobin 16.0 (*)    HCT 47.0 (*)    All other components within normal limits  CSF CULTURE W GRAM STAIN  TSH  CSF CELL COUNT WITH DIFFERENTIAL  PROTEIN, CSF  VITAMIN B1  HIV ANTIBODY (ROUTINE TESTING W REFLEX)  COMPREHENSIVE METABOLIC PANEL  CBC  POC URINE PREG, ED    No orders to display    Medications  nitrofurantoin (macrocrystal-monohydrate) (MACROBID) capsule 100 mg (has no administration in time range)  ALPRAZolam (XANAX) tablet 0.5 mg (has no administration in time range)  escitalopram (LEXAPRO) tablet 10 mg (has no administration in time range)  pantoprazole (PROTONIX) EC tablet 40 mg (has no administration in time range)  gabapentin (NEURONTIN) capsule 300 mg (has no administration in time range)  enoxaparin (LOVENOX) injection 40 mg (has no administration in time range)  sodium chloride flush (NS) 0.9 % injection 3 mL (has no administration in time range)  acetaminophen (TYLENOL) tablet 650 mg (has no administration in time range)    Or  acetaminophen (TYLENOL) suppository 650 mg (has no administration in time range)  gabapentin (NEURONTIN) capsule 300 mg (300 mg Oral Given 12/02/21 2006)  acetaminophen (TYLENOL) tablet 650 mg (650 mg Oral Given 12/02/21 2006)  potassium chloride  SA (KLOR-CON M) CR tablet 20 mEq (20 mEq Oral Given 12/02/21 2006)  ketorolac (TORADOL) 15 MG/ML injection 15 mg (15 mg Intravenous Given 12/02/21 2101)  magnesium sulfate IVPB 2 g 50 mL (0 g Intravenous Stopped 12/02/21 2255)     Procedures  /  Critical Care .Lumbar Puncture  Date/Time: 12/02/2021 11:57 PM Performed by: Zachery Dakins, MD Authorized by: Carmin Muskrat, MD   Consent:    Consent obtained:  Written   Consent given by:  Patient   Risks, benefits, and alternatives were discussed: yes     Risks discussed:  Bleeding, infection, pain, headache, nerve damage and repeat procedure   Alternatives discussed:  No treatment and delayed treatment Universal protocol:    Patient identity confirmed:  Verbally with patient and arm band Pre-procedure details:    Procedure purpose:  Diagnostic   Preparation: Patient was prepped and draped in usual sterile fashion   Anesthesia:    Anesthesia method:  Local infiltration Procedure details:    Lumbar space:  L4-L5 interspace   Patient position:  Sitting   Needle gauge:  20   Needle type:  Spinal needle - Quincke tip   Needle length (in):  2.5   Ultrasound guidance: no     Number of attempts:  1   Fluid appearance:  Clear   Tubes of fluid:  4   Total volume (ml):  10 Post-procedure details:    Puncture site:  Adhesive bandage applied and direct pressure applied   Procedure completion:  Tolerated well, no immediate complications  ED Course and Medical Decision Making  Initial Impression and Ddx 36 year old female presented to our emergency department for evaluation of  weakness and numbness.  Differential diagnosis includes but is not limited to the following: Electrolyte abnormality, ischemic stroke, Guillain-Barr syndrome, dry beriberi.  Leading differential at this time is Guillain-Barr syndrome given the patient's symptoms are similar to her prior episodes.  Neurology consulted on the patient's arrival who recommended lumbar  puncture.  Also obtain further lab studies to evaluate the patient's symptoms.  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  Interpretation of Diagnostics I personally reviewed the EKG and Cardiac Monitor and my interpretation is as follows: EKG reviewed and was consistent with prior EKGs and normal sinus rhythm without signs of ischemia or dysrhythmia.  Patient was noted to be in sinus rhythm on cardiac monitor.    Lab work-up was significant for hypokalemia and hypomagnesemia.  Both of these were replaced while in the emergency department.  Lumbar puncture was performed as per above.  The patient was noted to have a protein level of 18 with low cell count.  Patient Reassessment and Ultimate Disposition/Management Given the patient is still experiencing progressive weakness and numbness will admit the patient to the hospitalist service with neurology consultation.  They are in agreement this plan.  Patient management required discussion with the following services or consulting groups:  Hospitalist Service and Neurology  Complexity of Problems Addressed Acute illness or injury that poses threat of life of bodily function  Additional Data Reviewed and Analyzed Further history obtained from: Recent PCP notes and Recent Consult notes  Factors Impacting ED Encounter Risk Consideration of hospitalization    Final Clinical Impressions(s) / ED Diagnoses     ICD-10-CM   1. Weakness  R53.1       ED Discharge Orders     None        Discharge Instructions Discussed with and Provided to Patient:   Discharge Instructions   None       Zachery Dakins, MD 12/03/21 Adelfa Koh    Carmin Muskrat, MD 12/08/21 1221

## 2021-12-02 NOTE — Telephone Encounter (Signed)
She was seen by Amy/Dr. Jaynee Eagles for Guillain-Barr syndrome,  neuropathic pain   No follow-up in chart, I ordered EMG nerve conduction study,

## 2021-12-02 NOTE — Addendum Note (Signed)
Addended by: Marcial Pacas on: 12/02/2021 04:38 PM   Modules accepted: Orders

## 2021-12-02 NOTE — ED Triage Notes (Signed)
Pt sent here by PCP for ascending numbness that started yesterday. PCP spoke w/ [facility] and recommended coming here for imaging and neuro consult. Pt has hx of Guillain-Barre Syndrome 3 years ago. Pt states she is now numb from above the knee and down on both legs, having some mild numbness in perineal area and bilateral hands

## 2021-12-03 ENCOUNTER — Inpatient Hospital Stay (HOSPITAL_COMMUNITY): Payer: BC Managed Care – PPO

## 2021-12-03 ENCOUNTER — Encounter (HOSPITAL_COMMUNITY): Payer: Self-pay | Admitting: Internal Medicine

## 2021-12-03 DIAGNOSIS — E876 Hypokalemia: Secondary | ICD-10-CM | POA: Diagnosis present

## 2021-12-03 DIAGNOSIS — Z8669 Personal history of other diseases of the nervous system and sense organs: Secondary | ICD-10-CM | POA: Diagnosis not present

## 2021-12-03 DIAGNOSIS — R202 Paresthesia of skin: Secondary | ICD-10-CM | POA: Diagnosis present

## 2021-12-03 DIAGNOSIS — F419 Anxiety disorder, unspecified: Secondary | ICD-10-CM

## 2021-12-03 DIAGNOSIS — K219 Gastro-esophageal reflux disease without esophagitis: Secondary | ICD-10-CM

## 2021-12-03 DIAGNOSIS — E5111 Dry beriberi: Secondary | ICD-10-CM

## 2021-12-03 DIAGNOSIS — R197 Diarrhea, unspecified: Secondary | ICD-10-CM | POA: Diagnosis present

## 2021-12-03 DIAGNOSIS — Z6820 Body mass index (BMI) 20.0-20.9, adult: Secondary | ICD-10-CM | POA: Diagnosis not present

## 2021-12-03 DIAGNOSIS — Z91048 Other nonmedicinal substance allergy status: Secondary | ICD-10-CM | POA: Diagnosis not present

## 2021-12-03 DIAGNOSIS — G629 Polyneuropathy, unspecified: Secondary | ICD-10-CM | POA: Diagnosis present

## 2021-12-03 DIAGNOSIS — Z79899 Other long term (current) drug therapy: Secondary | ICD-10-CM | POA: Diagnosis not present

## 2021-12-03 DIAGNOSIS — E538 Deficiency of other specified B group vitamins: Secondary | ICD-10-CM

## 2021-12-03 DIAGNOSIS — N390 Urinary tract infection, site not specified: Secondary | ICD-10-CM | POA: Diagnosis present

## 2021-12-03 DIAGNOSIS — Z818 Family history of other mental and behavioral disorders: Secondary | ICD-10-CM | POA: Diagnosis not present

## 2021-12-03 DIAGNOSIS — F32A Depression, unspecified: Secondary | ICD-10-CM | POA: Diagnosis present

## 2021-12-03 DIAGNOSIS — R531 Weakness: Secondary | ICD-10-CM

## 2021-12-03 DIAGNOSIS — F17210 Nicotine dependence, cigarettes, uncomplicated: Secondary | ICD-10-CM | POA: Diagnosis present

## 2021-12-03 DIAGNOSIS — E441 Mild protein-calorie malnutrition: Secondary | ICD-10-CM | POA: Diagnosis present

## 2021-12-03 DIAGNOSIS — Z20822 Contact with and (suspected) exposure to covid-19: Secondary | ICD-10-CM | POA: Diagnosis present

## 2021-12-03 HISTORY — DX: Hypomagnesemia: E83.42

## 2021-12-03 LAB — RESP PANEL BY RT-PCR (FLU A&B, COVID) ARPGX2
Influenza A by PCR: NEGATIVE
Influenza B by PCR: NEGATIVE
SARS Coronavirus 2 by RT PCR: NEGATIVE

## 2021-12-03 LAB — BASIC METABOLIC PANEL
Anion gap: 15 (ref 5–15)
BUN: <5 mg/dL — ABNORMAL LOW (ref 6–20)
CO2: 22 mmol/L (ref 22–32)
Calcium: 8.8 mg/dL — ABNORMAL LOW (ref 8.9–10.3)
Chloride: 98 mmol/L (ref 98–111)
Creatinine, Ser: 0.79 mg/dL (ref 0.44–1.00)
GFR, Estimated: >60 mL/min (ref >60)
Glucose, Bld: 122 mg/dL — ABNORMAL HIGH (ref 70–99)
Potassium: 2.6 mmol/L — CL (ref 3.5–5.1)
Sodium: 135 mmol/L (ref 135–145)

## 2021-12-03 LAB — COMPREHENSIVE METABOLIC PANEL
ALT: 34 U/L (ref 0–44)
AST: 81 U/L — ABNORMAL HIGH (ref 15–41)
Albumin: 3 g/dL — ABNORMAL LOW (ref 3.5–5.0)
Alkaline Phosphatase: 124 U/L (ref 38–126)
Anion gap: 10 (ref 5–15)
BUN: <5 mg/dL — ABNORMAL LOW (ref 6–20)
CO2: 23 mmol/L (ref 22–32)
Calcium: 8.5 mg/dL — ABNORMAL LOW (ref 8.9–10.3)
Chloride: 101 mmol/L (ref 98–111)
Creatinine, Ser: 0.63 mg/dL (ref 0.44–1.00)
GFR, Estimated: >60 mL/min (ref >60)
Glucose, Bld: 87 mg/dL (ref 70–99)
Potassium: 3.9 mmol/L (ref 3.5–5.1)
Sodium: 134 mmol/L — ABNORMAL LOW (ref 135–145)
Total Bilirubin: 2.7 mg/dL — ABNORMAL HIGH (ref 0.3–1.2)
Total Protein: 5.8 g/dL — ABNORMAL LOW (ref 6.5–8.1)

## 2021-12-03 LAB — CBC
HCT: 39 % (ref 36.0–46.0)
Hemoglobin: 13.5 g/dL (ref 12.0–15.0)
MCH: 35.1 pg — ABNORMAL HIGH (ref 26.0–34.0)
MCHC: 34.6 g/dL (ref 30.0–36.0)
MCV: 101.3 fL — ABNORMAL HIGH (ref 80.0–100.0)
Platelets: 278 10*3/uL (ref 150–400)
RBC: 3.85 MIL/uL — ABNORMAL LOW (ref 3.87–5.11)
RDW: 13 % (ref 11.5–15.5)
WBC: 7.3 10*3/uL (ref 4.0–10.5)
nRBC: 0 % (ref 0.0–0.2)

## 2021-12-03 LAB — PREGNANCY, URINE: Preg Test, Ur: NEGATIVE

## 2021-12-03 LAB — MAGNESIUM: Magnesium: 2.2 mg/dL (ref 1.7–2.4)

## 2021-12-03 LAB — HIV ANTIBODY (ROUTINE TESTING W REFLEX): HIV Screen 4th Generation wRfx: NONREACTIVE

## 2021-12-03 LAB — FOLATE: Folate: 3 ng/mL — ABNORMAL LOW (ref >5.9)

## 2021-12-03 LAB — RPR: RPR Ser Ql: NONREACTIVE

## 2021-12-03 LAB — VITAMIN B12: Vitamin B-12: 437 pg/mL (ref 180–914)

## 2021-12-03 MED ORDER — NALOXONE HCL 0.4 MG/ML IJ SOLN: 0.4000 mg | INTRAMUSCULAR | Status: DC | PRN

## 2021-12-03 MED ORDER — ACETAMINOPHEN 325 MG PO TABS: 650.0000 mg | ORAL_TABLET | Freq: Four times a day (QID) | ORAL | Status: DC | PRN

## 2021-12-03 MED ORDER — ACETAMINOPHEN 650 MG RE SUPP: 650.0000 mg | Freq: Four times a day (QID) | RECTAL | Status: DC | PRN

## 2021-12-03 MED ORDER — ALPRAZOLAM 0.5 MG PO TABS
0.5000 mg | ORAL_TABLET | Freq: Three times a day (TID) | ORAL | Status: DC
  Administered 2021-12-03 – 2021-12-09 (×20): 0.5 mg via ORAL
  Filled 2021-12-03: qty 1
  Filled 2021-12-03: qty 2
  Filled 2021-12-03 (×6): qty 1
  Filled 2021-12-03: qty 2
  Filled 2021-12-03 (×10): qty 1
  Filled 2021-12-03: qty 2

## 2021-12-03 MED ORDER — POTASSIUM CHLORIDE CRYS ER 20 MEQ PO TBCR
40.0000 meq | EXTENDED_RELEASE_TABLET | Freq: Once | ORAL | Status: AC
  Administered 2021-12-03: 40 meq via ORAL
  Filled 2021-12-03: qty 2

## 2021-12-03 MED ORDER — THIAMINE HCL 100 MG/ML IJ SOLN: 100.0000 mg | Freq: Every day | INTRAMUSCULAR | Status: DC

## 2021-12-03 MED ORDER — ENSURE ENLIVE PO LIQD
237.0000 mL | Freq: Two times a day (BID) | ORAL | Status: DC
  Administered 2021-12-04 – 2021-12-08 (×9): 237 mL via ORAL

## 2021-12-03 MED ORDER — ESCITALOPRAM OXALATE 10 MG PO TABS
10.0000 mg | ORAL_TABLET | Freq: Every day | ORAL | Status: DC
  Administered 2021-12-03 – 2021-12-09 (×7): 10 mg via ORAL
  Filled 2021-12-03 (×7): qty 1

## 2021-12-03 MED ORDER — THIAMINE HCL 100 MG/ML IJ SOLN
500.0000 mg | Freq: Three times a day (TID) | INTRAVENOUS | Status: AC
  Administered 2021-12-03 – 2021-12-04 (×6): 500 mg via INTRAVENOUS
  Filled 2021-12-03 (×6): qty 5

## 2021-12-03 MED ORDER — GABAPENTIN 400 MG PO CAPS
400.0000 mg | ORAL_CAPSULE | Freq: Three times a day (TID) | ORAL | Status: DC
  Administered 2021-12-03 – 2021-12-06 (×9): 400 mg via ORAL
  Filled 2021-12-03 (×9): qty 1

## 2021-12-03 MED ORDER — SODIUM CHLORIDE 0.9% FLUSH
3.0000 mL | Freq: Two times a day (BID) | INTRAVENOUS | Status: DC
  Administered 2021-12-03 – 2021-12-09 (×14): 3 mL via INTRAVENOUS

## 2021-12-03 MED ORDER — GABAPENTIN 300 MG PO CAPS
300.0000 mg | ORAL_CAPSULE | Freq: Three times a day (TID) | ORAL | Status: DC
  Administered 2021-12-03 (×2): 300 mg via ORAL
  Filled 2021-12-03 (×2): qty 1

## 2021-12-03 MED ORDER — NITROFURANTOIN MONOHYD MACRO 100 MG PO CAPS
100.0000 mg | ORAL_CAPSULE | Freq: Two times a day (BID) | ORAL | Status: DC
Start: 2021-12-03 — End: 2021-12-03
  Administered 2021-12-03: 100 mg via ORAL
  Filled 2021-12-03 (×2): qty 1

## 2021-12-03 MED ORDER — ENOXAPARIN SODIUM 40 MG/0.4ML IJ SOSY
40.0000 mg | PREFILLED_SYRINGE | Freq: Every day | INTRAMUSCULAR | Status: DC
  Administered 2021-12-03 – 2021-12-09 (×7): 40 mg via SUBCUTANEOUS
  Filled 2021-12-03 (×7): qty 0.4

## 2021-12-03 MED ORDER — HYDROCODONE-ACETAMINOPHEN 5-325 MG PO TABS
1.0000 | ORAL_TABLET | Freq: Four times a day (QID) | ORAL | Status: DC | PRN
  Administered 2021-12-03 – 2021-12-07 (×10): 2 via ORAL
  Filled 2021-12-03 (×10): qty 2

## 2021-12-03 MED ORDER — POTASSIUM CHLORIDE 10 MEQ/100ML IV SOLN
10.0000 meq | INTRAVENOUS | Status: AC
  Administered 2021-12-03 (×6): 10 meq via INTRAVENOUS
  Filled 2021-12-03 (×6): qty 100

## 2021-12-03 MED ORDER — PANTOPRAZOLE SODIUM 40 MG PO TBEC
40.0000 mg | DELAYED_RELEASE_TABLET | Freq: Every day | ORAL | Status: DC
  Administered 2021-12-03 (×2): 40 mg via ORAL
  Filled 2021-12-03 (×2): qty 1

## 2021-12-03 MED ORDER — THIAMINE HCL 100 MG/ML IJ SOLN
250.0000 mg | Freq: Every day | INTRAVENOUS | Status: DC
  Administered 2021-12-05 – 2021-12-09 (×5): 250 mg via INTRAVENOUS
  Filled 2021-12-03 (×5): qty 2.5

## 2021-12-03 MED ORDER — GADOBUTROL 1 MMOL/ML IV SOLN
7.0000 mL | Freq: Once | INTRAVENOUS | Status: AC | PRN
  Administered 2021-12-03: 7 mL via INTRAVENOUS

## 2021-12-03 MED ORDER — FOLIC ACID 5 MG/ML IJ SOLN
1.0000 mg | Freq: Every day | INTRAMUSCULAR | Status: DC
  Administered 2021-12-03: 1 mg via INTRAVENOUS
  Filled 2021-12-03 (×2): qty 0.2

## 2021-12-03 MED ORDER — FENTANYL CITRATE PF 50 MCG/ML IJ SOSY
25.0000 ug | PREFILLED_SYRINGE | Freq: Four times a day (QID) | INTRAMUSCULAR | Status: DC | PRN
  Administered 2021-12-03 – 2021-12-07 (×6): 25 ug via INTRAVENOUS
  Filled 2021-12-03 (×6): qty 1

## 2021-12-03 MED ORDER — FOLIC ACID 5 MG/ML IJ SOLN
2.0000 mg | Freq: Every day | INTRAMUSCULAR | Status: DC
  Administered 2021-12-04 – 2021-12-08 (×5): 2 mg via INTRAVENOUS
  Filled 2021-12-03 (×6): qty 0.4

## 2021-12-03 MED ORDER — HYDROCODONE-ACETAMINOPHEN 5-325 MG PO TABS
1.0000 | ORAL_TABLET | ORAL | Status: AC | PRN
  Administered 2021-12-03 (×2): 2 via ORAL
  Filled 2021-12-03 (×2): qty 2

## 2021-12-03 NOTE — Assessment & Plan Note (Signed)
Per patient-she has some residual neuropathy from her prior episode of GBS-remains on Neurontin.

## 2021-12-03 NOTE — Assessment & Plan Note (Addendum)
Repleted.?  Discontinue Protonix and switch to Pepcid, has intermittent diarrhea nausea vomiting at baseline under the care of Dr. Loletha Carrow.  Will monitor for now with supportive care.  Replace electrolytes.

## 2021-12-03 NOTE — Assessment & Plan Note (Signed)
CSF studies/MRI spine not consistent with GBS-neurology following-we will defer initiation of IVIG to the neurology.

## 2021-12-03 NOTE — ED Notes (Signed)
Pt remains in MRI. Potassium on hold til finished with MRI, Pt unable to tolerate potassium without NS dripping with it, unable to do both fluids through the MRI line

## 2021-12-03 NOTE — Progress Notes (Addendum)
PROGRESS NOTE        PATIENT DETAILS Name: Kimberly [name] Age: 36 y.o. Sex: female Date of Birth: 1986-08-20 Admit Date: 12/02/2021 Admitting Physician Marcelyn Bruins, MD VOZ:DGUYQ, Purcell Nails, MD  Brief Summary: 36 year old with history of GBS January 2020 (Tx w IVIG)-subsequently hospitalized a few days later when her thiamine levels came back severely low (Tx w high dose IV Thiamine)-subsequently left with some chronic residual paresthesias-presented to the ED on 12/02/2021 with rapidly progressive paresthesias  Significant events: 1/26>> admit to [facility] for evaluation of rapidly progressive paresthesia.  Significant imaging studies: 1/27>> C-spine/T-spine/L-spine MRI: No cord lesions-no root enhancements.  Procedures: 1/26>> lumbar puncture by ED MD.  Significant studies: 1/26>>TSH: 2.54 (normal) 1/26>> CSF protein 18, WBC 1 1/26>> vitamin B1: Pending 1/27>> RPR/B3/B6/copper: Pending 1/27>> folate: 3 (low) 1/27>> vitamin B12: 437  Microbiology data: 1/27>> COVID/flu PCR: Negative 1/27>> HIV: Nonreactive 1/26>> CSF culture: Pending   Subjective: Paresthesias remain unchanged.  She was eating breakfast with both her legs crossed.  Claims she has some difficulty walking   Objective: Vitals: Blood pressure 120/90, pulse (!) 102, temperature 98.4 F (36.9 C), temperature source Oral, resp. rate (!) 22, SpO2 100 %.   Exam: Gen Exam:Alert awake-not in any distress HEENT:atraumatic, normocephalic Chest: B/L clear to auscultation anteriorly CVS:S1S2 regular Abdomen:soft non tender, non distended Extremities:no edema Neurology: Appears to have symmetric but mild lower extremity weakness.  Unable to elicit patellar/ankle reflex.  Grossly decreased sensation up to her pelvic area. Skin: no rash  Pertinent Labs/Radiology: CBC Latest Ref Rng & Units 12/03/2021 12/02/2021 12/02/2021  WBC 4.0 - 10.5 K/uL 7.3 - 10.3  Hemoglobin 12.0 - 15.0 g/dL 13.5  16.0(H) 15.3(H)  Hematocrit 36.0 - 46.0 % 39.0 47.0(H) 44.0  Platelets 150 - 400 K/uL 278 - 309    Lab Results  Component Value Date   NA 134 (L) 12/03/2021   K 3.9 12/03/2021   CL 101 12/03/2021   CO2 23 12/03/2021      Assessment/Plan: * Rapidly progressive ascending paresthesias Unclear etiology-awaiting thiamine and other vitamin levels.  She did have a influenza vaccine on 11/09/2021.  CSF studies not consistent with GBS, MRI of the entire spine without any major cord anomaly-no root enhancement seen.  Her paresthesias are essentially unchanged today when compared to initial presentation.  She remains empirically on high-dose IV thiamine.Neurology following-work-up in progress (see above)-we will await further recommendations.  Folic acid deficiency- (present on admission) Continue to supplement.  Unclear if this is the cause of her presenting paresthesias.  Hypokalemia/hypomagnesemia- (present on admission) Repleted.?  Etiology-could be from GI loss-as she did have a few episodes of vomiting over the past few days-or from PPI.  She denies any significant alcohol use-only drinks socially.  PPI being cautiously continued-we will repeat electrolytes including magnesium daily to see if this reoccurs, if it does-PPI may need to be discontinued.  GERD (gastroesophageal reflux disease)- (present on admission) Continue PPI cautiously-see above  Anxiety/depression- (present on admission) Continue Xanax/Lexapro.  History of Guillain-Barre syndrome January 2020 CSF studies/MRI spine not consistent with GBS-neurology following-we will defer initiation of IVIG to the neurology.  History of dry beriberi January 2020- (present on admission) On IV thiamine empirically-awaiting thiamine levels.  Polyneuropathy Per patient-she has some residual neuropathy from her prior episode of GBS-remains on Neurontin.   BMI: Estimated body mass index is  27.88 kg/m as calculated from the following:    Height as of 09/30/20: 5\' 7"  (1.702 m).   Weight as of 09/30/20: 80.7 kg.    DVT Prophylaxis: SQ Lovenox Procedures: None Consults: Neurology Code Status:Full code Family Communication: None at bedside   Disposition Plan: Status is: Inpatient  Remains inpatient appropriate because: Ascending paresthesias with weakness-work-up in progress-not yet stable for discharge.   Diet: Diet Order             Diet regular Room service appropriate? Yes; Fluid consistency: Thin  Diet effective now                     Antimicrobial agents: Anti-infectives (From admission, onward)    Start     Dose/Rate Route Frequency Ordered Stop   12/03/21 1000  nitrofurantoin (macrocrystal-monohydrate) (MACROBID) capsule 100 mg  Status:  Discontinued        100 mg Oral 2 times daily 12/03/21 0000 12/03/21 1008        MEDICATIONS: Scheduled Meds:  ALPRAZolam  0.5 mg Oral TID   enoxaparin (LOVENOX) injection  40 mg Subcutaneous Daily   escitalopram  10 mg Oral Daily   [START ON 2/95/1884] folic acid  2 mg Intravenous Daily   gabapentin  300 mg Oral TID   pantoprazole  40 mg Oral Daily   sodium chloride flush  3 mL Intravenous Q12H   [START ON 12/11/2021] thiamine injection  100 mg Intravenous Daily   Continuous Infusions:  thiamine injection 500 mg (12/03/21 1310)   Followed by   Derrill Memo ON 12/05/2021] thiamine injection     PRN Meds:.acetaminophen **OR** acetaminophen   I have personally reviewed following labs and imaging studies  LABORATORY DATA: CBC: Recent Labs  Lab 12/02/21 1652 12/02/21 1911 12/03/21 0517  WBC 10.3  --  7.3  NEUTROABS 6.5  --   --   HGB 15.3* 16.0* 13.5  HCT 44.0 47.0* 39.0  MCV 101.9*  --  101.3*  PLT 309  --  166    Basic Metabolic Panel: Recent Labs  Lab 12/02/21 1911 12/02/21 1937 12/03/21 0036 12/03/21 0517  NA 136  --  135 134*  K 2.8*  --  2.6* 3.9  CL 101  --  98 101  CO2  --   --  22 23  GLUCOSE 96  --  122* 87  BUN <3*  --  <5*  <5*  CREATININE 0.40*  --  0.79 0.63  CALCIUM  --   --  8.8* 8.5*  MG  --  1.6*  --  2.2    GFR: CrCl cannot be calculated (Unknown ideal weight.).  Liver Function Tests: Recent Labs  Lab 12/03/21 0517  AST 81*  ALT 34  ALKPHOS 124  BILITOT 2.7*  PROT 5.8*  ALBUMIN 3.0*   No results for input(s): LIPASE, AMYLASE in the last 168 hours. No results for input(s): AMMONIA in the last 168 hours.  Coagulation Profile: No results for input(s): INR, PROTIME in the last 168 hours.  Cardiac Enzymes: No results for input(s): CKTOTAL, CKMB, CKMBINDEX, TROPONINI in the last 168 hours.  BNP (last 3 results) No results for input(s): PROBNP in the last 8760 hours.  Lipid Profile: No results for input(s): CHOL, HDL, LDLCALC, TRIG, CHOLHDL, LDLDIRECT in the last 72 hours.  Thyroid Function Tests: Recent Labs    12/02/21 1652  TSH 2.548    Anemia Panel: Recent Labs    12/03/21 0036  VITAMINB12 437  FOLATE 3.0*    Urine analysis:    Component Value Date/Time   COLORURINE BROWN (A) 10/24/2019 1211   APPEARANCEUR CLOUDY (A) 10/24/2019 1211   LABSPEC 1.025 10/24/2019 1211   PHURINE 6.0 10/24/2019 1211   GLUCOSEU NEGATIVE 10/24/2019 1211   HGBUR 2+ (A) 10/24/2019 1211   BILIRUBINUR NEGATIVE 12/06/2018 1629   KETONESUR NEGATIVE 10/24/2019 1211   PROTEINUR 1+ (A) 10/24/2019 1211   UROBILINOGEN 0.2 06/03/2014 1311   NITRITE NEGATIVE 12/06/2018 1629   LEUKOCYTESUR NEGATIVE 12/06/2018 1629    Sepsis Labs: Lactic Acid, Venous    Component Value Date/Time   LATICACIDVEN 2.85 (HH) 11/16/2018 1118    MICROBIOLOGY: Recent Results (from the past 240 hour(s))  CSF culture     Status: None (Preliminary result)   Collection Time: 12/02/21  8:07 PM   Specimen: CSF; Cerebrospinal Fluid  Result Value Ref Range Status   Specimen Description CSF  Final   Special Requests Normal  Final   Gram Stain NO ORGANISMS SEEN  Final   Culture   Final    NO GROWTH < 12 HOURS Performed at  [facility], [place] [address], [place], [place] [address]    Report Status PENDING  Incomplete  Resp Panel by RT-PCR (Flu A&B, Covid) Nasopharyngeal Swab     Status: None   Collection Time: 12/03/21 12:18 AM   Specimen: Nasopharyngeal Swab; Nasopharyngeal(NP) swabs in vial transport medium  Result Value Ref Range Status   SARS Coronavirus 2 by RT PCR NEGATIVE NEGATIVE Final    Comment: (NOTE) SARS-CoV-2 target nucleic acids are NOT DETECTED.  The SARS-CoV-2 RNA is generally detectable in upper respiratory specimens during the acute phase of infection. The lowest concentration of SARS-CoV-2 viral copies this assay can detect is 138 copies/mL. A negative result does not preclude SARS-Cov-2 infection and should not be used as the sole basis for treatment or other patient management decisions. A negative result may occur with  improper specimen collection/handling, submission of specimen other than nasopharyngeal swab, presence of viral mutation(s) within the areas targeted by this assay, and inadequate number of viral copies(<138 copies/mL). A negative result must be combined with clinical observations, patient history, and epidemiological information. The expected result is Negative.  Fact Sheet for Patients:  EntrepreneurPulse.com.au  Fact Sheet for Healthcare Providers:  IncredibleEmployment.be  This test is no t yet approved or cleared by the [place] FDA and  has been authorized for detection and/or diagnosis of SARS-CoV-2 by FDA under an Emergency Use Authorization (EUA). This EUA will remain  in effect (meaning this test can be used) for the duration of the COVID-19 declaration under Section 564(b)(1) of the Act, 21 U.S.C.section 360bbb-3(b)(1), unless the authorization is terminated  or revoked sooner.       Influenza A by PCR NEGATIVE NEGATIVE Final   Influenza B by PCR NEGATIVE NEGATIVE Final    Comment: (NOTE) The Xpert  Xpress SARS-CoV-2/FLU/RSV plus assay is intended as an aid in the diagnosis of influenza from Nasopharyngeal swab specimens and should not be used as a sole basis for treatment. Nasal washings and aspirates are unacceptable for Xpert Xpress SARS-CoV-2/FLU/RSV testing.  Fact Sheet for Patients: EntrepreneurPulse.com.au  Fact Sheet for Healthcare Providers: IncredibleEmployment.be  This test is not yet approved or cleared by the [place] FDA and has been authorized for detection and/or diagnosis of SARS-CoV-2 by FDA under an Emergency Use Authorization (EUA). This EUA will remain in effect (meaning this test can be used) for the duration  of the COVID-19 declaration under Section 564(b)(1) of the Act, 21 U.S.C. section 360bbb-3(b)(1), unless the authorization is terminated or revoked.  Performed at [facility], [place] [address], [place], [place] [address]     RADIOLOGY STUDIES/RESULTS: MR CERVICAL SPINE W WO CONTRAST  Result Date: 12/03/2021 CLINICAL DATA:  Acute myelopathy. History of Guillain-Barre. Progressive weakness and numbness. CSF leak suspected EXAM: MRI CERVICAL, THORACIC AND LUMBAR SPINE WITHOUT AND WITH CONTRAST TECHNIQUE: Multiplanar and multiecho pulse sequences of the cervical spine, to include the craniocervical junction and cervicothoracic junction, and thoracic and lumbar spine, were obtained without and with intravenous contrast. CONTRAST:  59mL GADAVIST GADOBUTROL 1 MMOL/ML IV SOLN COMPARISON:  11/16/2018 FINDINGS: MRI CERVICAL SPINE FINDINGS Alignment: Normal Vertebrae: No acute fracture, evidence of discitis, or bone lesion. Cord: Normal signal and morphology. No evidence of intrathecal collection. No dorsal column signal abnormality. Posterior Fossa, vertebral arteries, paraspinal tissues: No evidence of perispinal mass or inflammation. Right anterior lobe thyroid nodule measuring 9 mm. No followup recommended (ref: J Am  Coll Radiol. 2015 Feb;12(2): 143-50). Disc levels: C2-3: Unremarkable. C3-4: Unremarkable. C4-5: Minor disc bulging C5-6: Small central disc protrusion. C6-7: Minor disc bulging C7-T1:Unremarkable. MRI THORACIC SPINE FINDINGS Alignment: Mild anterolisthesis at T11-12. Prominent inter spinous with at this level but no facet subluxation. Vertebrae: Remote T12 compression fracture with cement augmentation. Remote superior endplate fractures at D3-O6. Cord:  Normal signal and morphology.  No spinal canal collection. Paraspinal and other soft tissues: Negative Disc levels: T11-12 disc space narrowing. T6-7 disc space narrowing. No neural impingement. MRI LUMBAR SPINE FINDINGS Segmentation:  5 lumbar type vertebrae Alignment:  Normal Vertebrae:  No fracture, evidence of discitis, or bone lesion. Conus medullaris and cauda equina: Conus extends to the L1-2 level. Conus and cauda equina appear normal. Paraspinal and other soft tissues: No perispinal mass or inflammation. Disc levels: Mild disc desiccation and narrowing with annular fissure and shallow protrusion at L5-S1. No neural compression. IMPRESSION: 1. Normal MRI of the cord and cauda equina. No CSF collection to implicate leak. 2. No neural impingement. 3. Remote and healed fractures of T1, T2, T3, and T12 vertebral bodies. Electronically Signed   By: Jorje Guild M.D.   On: 12/03/2021 05:01   MR THORACIC SPINE W WO CONTRAST  Result Date: 12/03/2021 CLINICAL DATA:  Acute myelopathy. History of Guillain-Barre. Progressive weakness and numbness. CSF leak suspected EXAM: MRI CERVICAL, THORACIC AND LUMBAR SPINE WITHOUT AND WITH CONTRAST TECHNIQUE: Multiplanar and multiecho pulse sequences of the cervical spine, to include the craniocervical junction and cervicothoracic junction, and thoracic and lumbar spine, were obtained without and with intravenous contrast. CONTRAST:  11mL GADAVIST GADOBUTROL 1 MMOL/ML IV SOLN COMPARISON:  11/16/2018 FINDINGS: MRI CERVICAL  SPINE FINDINGS Alignment: Normal Vertebrae: No acute fracture, evidence of discitis, or bone lesion. Cord: Normal signal and morphology. No evidence of intrathecal collection. No dorsal column signal abnormality. Posterior Fossa, vertebral arteries, paraspinal tissues: No evidence of perispinal mass or inflammation. Right anterior lobe thyroid nodule measuring 9 mm. No followup recommended (ref: J Am Coll Radiol. 2015 Feb;12(2): 143-50). Disc levels: C2-3: Unremarkable. C3-4: Unremarkable. C4-5: Minor disc bulging C5-6: Small central disc protrusion. C6-7: Minor disc bulging C7-T1:Unremarkable. MRI THORACIC SPINE FINDINGS Alignment: Mild anterolisthesis at T11-12. Prominent inter spinous with at this level but no facet subluxation. Vertebrae: Remote T12 compression fracture with cement augmentation. Remote superior endplate fractures at Z1-I4. Cord:  Normal signal and morphology.  No spinal canal collection. Paraspinal and other soft tissues: Negative Disc  levels: T11-12 disc space narrowing. T6-7 disc space narrowing. No neural impingement. MRI LUMBAR SPINE FINDINGS Segmentation:  5 lumbar type vertebrae Alignment:  Normal Vertebrae:  No fracture, evidence of discitis, or bone lesion. Conus medullaris and cauda equina: Conus extends to the L1-2 level. Conus and cauda equina appear normal. Paraspinal and other soft tissues: No perispinal mass or inflammation. Disc levels: Mild disc desiccation and narrowing with annular fissure and shallow protrusion at L5-S1. No neural compression. IMPRESSION: 1. Normal MRI of the cord and cauda equina. No CSF collection to implicate leak. 2. No neural impingement. 3. Remote and healed fractures of T1, T2, T3, and T12 vertebral bodies. Electronically Signed   By: Jorje Guild M.D.   On: 12/03/2021 05:01   MR Lumbar Spine W Wo Contrast  Result Date: 12/03/2021 CLINICAL DATA:  Acute myelopathy. History of Guillain-Barre. Progressive weakness and numbness. CSF leak suspected  EXAM: MRI CERVICAL, THORACIC AND LUMBAR SPINE WITHOUT AND WITH CONTRAST TECHNIQUE: Multiplanar and multiecho pulse sequences of the cervical spine, to include the craniocervical junction and cervicothoracic junction, and thoracic and lumbar spine, were obtained without and with intravenous contrast. CONTRAST:  39mL GADAVIST GADOBUTROL 1 MMOL/ML IV SOLN COMPARISON:  11/16/2018 FINDINGS: MRI CERVICAL SPINE FINDINGS Alignment: Normal Vertebrae: No acute fracture, evidence of discitis, or bone lesion. Cord: Normal signal and morphology. No evidence of intrathecal collection. No dorsal column signal abnormality. Posterior Fossa, vertebral arteries, paraspinal tissues: No evidence of perispinal mass or inflammation. Right anterior lobe thyroid nodule measuring 9 mm. No followup recommended (ref: J Am Coll Radiol. 2015 Feb;12(2): 143-50). Disc levels: C2-3: Unremarkable. C3-4: Unremarkable. C4-5: Minor disc bulging C5-6: Small central disc protrusion. C6-7: Minor disc bulging C7-T1:Unremarkable. MRI THORACIC SPINE FINDINGS Alignment: Mild anterolisthesis at T11-12. Prominent inter spinous with at this level but no facet subluxation. Vertebrae: Remote T12 compression fracture with cement augmentation. Remote superior endplate fractures at Z6-X0. Cord:  Normal signal and morphology.  No spinal canal collection. Paraspinal and other soft tissues: Negative Disc levels: T11-12 disc space narrowing. T6-7 disc space narrowing. No neural impingement. MRI LUMBAR SPINE FINDINGS Segmentation:  5 lumbar type vertebrae Alignment:  Normal Vertebrae:  No fracture, evidence of discitis, or bone lesion. Conus medullaris and cauda equina: Conus extends to the L1-2 level. Conus and cauda equina appear normal. Paraspinal and other soft tissues: No perispinal mass or inflammation. Disc levels: Mild disc desiccation and narrowing with annular fissure and shallow protrusion at L5-S1. No neural compression. IMPRESSION: 1. Normal MRI of the cord  and cauda equina. No CSF collection to implicate leak. 2. No neural impingement. 3. Remote and healed fractures of T1, T2, T3, and T12 vertebral bodies. Electronically Signed   By: Jorje Guild M.D.   On: 12/03/2021 05:01     LOS: 0 days   Oren Binet, MD  [facility]    To contact the attending provider between 7A-7P or the covering provider during after hours 7P-7A, please log into the web site [email or web address] and access using universal [place] password for that web site. If you do not have the password, please call the hospital operator.  12/03/2021, 2:02 PM

## 2021-12-03 NOTE — Assessment & Plan Note (Addendum)
Rapidly progressive bilateral lower extremity ascending paresthesias with known past medical history of beriberi and Guillain-Barr syndrome.  Currently being seen by neurology, on IV thiamine and folic acid supplementation, monitor pending B^ and some other micronutrient levels, case discussed with neurology she is getting 5-day trial of IVIG starting on 12/05/2021.  She will have to follow outpatient with neurology posttreatment along with GI, I think most of her issues are arising from severe micronutrient dietary deficiency, she seems to have chronic diarrhea for several years and also has significant history of alcohol abuse in the past with known thiamine and folic acid deficiency with B12 being borderline.

## 2021-12-03 NOTE — Assessment & Plan Note (Signed)
Continue to supplement.  Unclear if this is the cause of her presenting paresthesias.

## 2021-12-03 NOTE — Assessment & Plan Note (Addendum)
Continue PPI cautiously-see above

## 2021-12-03 NOTE — Assessment & Plan Note (Addendum)
On IV thiamine empirically-was also deficient in folic acid levels, F11 levels were borderline.  All being supplemented.  She has ongoing diarrhea for several years with history of alcohol abuse in the past.  Needs close outpatient GI follow-up for possible malabsorption and multiple micronutrien/vitamin deficiencies.  She follows with Dr. Simona Huh with [facility].

## 2021-12-03 NOTE — Plan of Care (Signed)

## 2021-12-03 NOTE — Hospital Course (Addendum)
36 year old with history of GBS January 2020 (Tx w IVIG)-subsequently hospitalized a few days later when her thiamine levels came back severely low (Tx w high dose IV Thiamine)-subsequently left with some chronic residual paresthesias-presented to the ED on 12/02/2021 with rapidly progressive paresthesias  Significant events: 1/26>> admit to [facility] for evaluation of rapidly progressive paresthesia.  Significant imaging studies: 1/27>> C-spine/T-spine/L-spine MRI: No cord lesions-no root enhancements.  Procedures: 1/26>> lumbar puncture by ED MD.  Significant studies: 1/26>>TSH: 2.54 (normal) 1/26>> CSF protein 18, WBC 1 1/26>> vitamin B1: Pending 1/27>> RPR/B3/B6/copper: Pending 1/27>> folate: 3 (low) 1/27>> vitamin B12: 437  Microbiology data: 1/27>> COVID/flu PCR: Negative 1/27>> HIV: Nonreactive 1/26>> CSF culture: Pending

## 2021-12-03 NOTE — Consult Note (Signed)
Neurology Consultation Reason for Consult: LE numbness Referring Physician: Trilby Drummer, a  CC: Lower extremity numbness and weakness  History is obtained from: Patient  HPI: Kimberly [name] is a 36 y.o. female with a history of an acute neuropathy that was diagnosed in January 2020.  She had an LP which was contaminated with blood making the cells unreliable, however had a protein of 16.  She did, however, have an enhancing nerve root on MRI.  She was therefore treated as Guillain-Barr with IVIG.  She reports that she was already having some improvement, but it was also noted when her thiamine came back that she was deficient and therefore she was readmitted for IV thiamine at the time.  She gradually improved over the course of several months and has been stable since that time.  On the 25th she woke up knowing that she had increased numbness in her lower extremities.  When she woke up the next day, it had progressed, and at this point it involves an area in the middle of her chest, as well as her arms.  Of note, she reports a 25 pound weight loss that is unintentional as well as some hair loss.   ROS: A 14 point ROS was performed and is negative except as noted in the HPI.  Past Medical History:  Diagnosis Date   Abnormal Pap smear    ADD (attention deficit disorder)    Anxiety    Guillain Barr syndrome (HCC)    Headache(784.0)    HSV-1 infection    IBS (irritable bowel syndrome)    Knee hyperextension injury 2020   Left, being treated by ortho   Tachycardia      Family History  Problem Relation Age of Onset   Depression Mother        chronic   Hearing loss Mother    Hyperlipidemia Mother    Vision loss Mother        beachets disease   COPD Father    Diabetes Father    Cancer Maternal Grandmother 66       breast cancer   Parkinsonism Maternal Grandmother    Heart disease Maternal Grandmother    Breast cancer Maternal Grandmother    Alzheimer's disease Paternal  Grandmother    Heart disease Paternal Grandfather        Died age 21   Nephrolithiasis Paternal Grandfather    Heart disease Maternal Grandfather    Cancer Other 65       breast   Breast cancer Other      Social History:  reports that she has been smoking cigarettes. She has a 5.00 pack-year smoking history. She has never used smokeless tobacco. She reports current alcohol use. She reports that she does not use drugs.   Exam: Current vital signs: BP 114/88    Pulse 100    Temp 98.4 F (36.9 C) (Oral)    Resp 18    SpO2 (!) 80%  Vital signs in last 24 hours: Temp:  [98.4 F (36.9 C)] 98.4 F (36.9 C) (01/26 1707) Pulse Rate:  [94-119] 100 (01/26 2330) Resp:  [13-24] 18 (01/26 2330) BP: (101-116)/(70-96) 114/88 (01/26 2330) SpO2:  [80 %-100 %] 80 % (01/26 2330)   Physical Exam  Constitutional: Appears well-developed and well-nourished.  Psych: Affect appropriate to situation Eyes: No scleral injection HENT: No OP obstruction MSK: no joint deformities.  Cardiovascular: Normal rate and regular rhythm.  Respiratory: Effort normal, non-labored breathing GI: Soft.  No distension.  There is no tenderness.  Skin: WDI  Neuro: Mental Status: Patient is awake, alert, oriented to person, place, month, year, and situation. Patient is able to give a clear and coherent history. No signs of aphasia or neglect Cranial Nerves: II: Visual Fields are full. Pupils are equal, round, and reactive to light.   III,IV, VI: EOMI without ptosis or diploplia.  V: Facial sensation is symmetric to temperature VII: Facial movement is symmetric.  VIII: hearing is intact to voice X: Uvula elevates symmetrically XI: Shoulder shrug is symmetric. XII: tongue is midline without atrophy or fasciculations.  Motor: 4/5 left interossei, otherwise 4+/5 throughout upper extremities. No drift. 4/5 in bilateral lower extremities except for 4-/5 in left inversion.  Sensory: Sensation is diminished to light  touch and temperature in the arms and legs, she describes that it actually feels warmer as I go up checking for spinal level in the lower cervical region Deep Tendon Reflexes: 2+ and symmetric in the biceps and absent at the patellas and ankles Cerebellar: No ataxia on finger-nose-finger     I have reviewed labs in epic and the results pertinent to this consultation are: B12 437 Folate 3.0  TSH 2.54  CSF RBC 0 CSF WBC 1 CSF Glucose 85 CSF protein 18  Vit E - pending B6 pending B3(sent as misc)- pending RPR pending  I have reviewed the images obtained: MRI brain 02/2019 - normal  Impression: 36 year old female with fairly rapidly progressive paresthesias.  With her history of thiamine deficiency and reported recent weight loss, I think that dry beriberi certainly needs to be considered.  It can present as a rapidly ascending neuropathy.  The fact that she already has such prominent proximal involvement without loss of biceps reflexes does make me wonder about Guillain-Barr.  Her normal CSF protein will also argue against GBS as an etiology, though CSF protein can be normal early on.  With some suggestion of a level on exam, I think that reimaging her spine would be prudent.  One thing to note, given her preserved biceps reflexes is that folate deficiency is known for preserving biceps reflexes, though it typically presents less acutely.  Recommendations: 1) thiamine, B12, folate, B6, B3, vitamin E, copper 2) MRI C,T and L-spine with/without contrast 3) high-dose thiamine repletion 4) parenteral folate repletion for now, though can likley do oral repletion on discharge.  5) may need to consider IVIG pending above studies   Roland Rack, MD [facility] [phone]  If 7pm- 7am, please page neurology on call as listed in [place].

## 2021-12-03 NOTE — Assessment & Plan Note (Signed)
Continue Xanax/Lexapro.

## 2021-12-03 NOTE — Assessment & Plan Note (Deleted)
Repeated-unclear etiology-see above

## 2021-12-03 NOTE — TOC Initial Note (Signed)
Transition of Care [facility]) - Initial/Assessment Note    Patient Details  Name: Kimberly [name] MRN: [id number] Date of Birth: [birth year]  [facility] of [facility]) CM/SW Contact:    Verdell Carmine, RN Phone Number: 12/03/2021, 6:00 PM  Clinical Narrative:                  The Transition of Care Department [facility]) has reviewed patient and no TOC needs have been identified at this time. We will continue to monitor patient advancement through interdisciplinary progression rounds. If new patient transition needs arise, please place a TOC consult        Patient Goals and CMS Choice        Expected Discharge Plan and Services                                                Prior Living Arrangements/Services                       Activities of Daily Living      Permission Sought/Granted                  Emotional Assessment              Admission diagnosis:  Rapidly progressive weakness [R53.1] Patient Active Problem List   Diagnosis Date Noted   Rapidly progressive ascending paresthesias 12/03/2021   Hypomagnesemia 54/49/2010   Folic acid deficiency 05/18/1974   Polyneuropathy 12/02/2021   History of Guillain-Barre syndrome January 2020 12/02/2021   Vitamin B1 deficiency 02/12/2019   Polysubstance abuse ([place]) 02/12/2019   Neuropathy 12/14/2018   Guillain Barr syndrome ([place]) 12/14/2018   History of dry beriberi January 2020 12/06/2018   Tobacco abuse 12/06/2018   Hyperglycemia 88/32/5498   Metabolic acidosis 26/41/5830   IBS (irritable bowel syndrome) 12/06/2018   Constipation 12/06/2018   GERD (gastroesophageal reflux disease) 12/06/2018   AIDP (acute inflammatory demyelinating polyneuropathy) ([place]) 11/19/2018   Weakness of both lower extremities 11/16/2018   Hypokalemia/hypomagnesemia 11/16/2018   Anxiety/depression 11/16/2018   Acute pharyngitis 10/01/2013   Depo-Provera contraceptive status 05/17/2013   Postpartum  depression 10/09/2012   Lactation suppression 10/09/2012   Tachycardia 03/09/2012   HSV-1 infection    PCP:  Redmond School, MD Pharmacy:   [id number] [place], [place] [address] [place] [address] Phone: [phone] Fax: [phone]  [place], [place] - [address] [place] [address] Phone: [phone] Fax: [phone]     Social Determinants of Health (SDOH) Interventions    Readmission Risk Interventions No flowsheet data found.

## 2021-12-03 NOTE — H&P (Signed)
History and Physical   Kimberly [name] EGB:[id number] DOB: 1986/09/06 DOA: 12/02/2021  PCP: Redmond School, MD   Patient coming from: Home  Chief Complaint: Weakness, numbness  HPI: Kimberly [name] is a 36 y.o. female with medical history significant of Guillain-Barr, depression, anxiety, substance use, neuropathy, IBS, GERD, dry beriberi who presents with numbness and weakness.  Patient has had 2 days of progressive numbness and weakness.  Started on lower extremity now with numbness in the abdomen and chest as well.  Started having some difficulty walking.  Due to her history of Guillain-Barr patient became concerned as this feels similar.  Contacted her neurologist office who recommended ED evaluation.  She states she has recent viral illness and recently received her flu vaccine.  Also started treatment for UTI with Macrobid couple days ago.  She denies fevers, chills, chest pain, shortness of breath, abdominal pain, constipation, diarrhea, nausea, vomiting   ED Course: Vital signs in the ED significant for heart rate in the 100s.  Lab work-up showed i-STAT Chem-8 with potassium 2.8.  Magnesium 1.6.  CBC with hemoglobin of 15.  TSH normal.  CFS with normal protein and glucose of 85.  Cultures and cell count pending.  Multiple other labs pending including copper, B12, B6, B3, B1, folate, vitamin D.  Patient received Tylenol, Toradol, gabapentin, magnesium, 20 mEq of potassium in the ED.  Neurology consulted in the ED and will see the patient.  Review of Systems: As per HPI otherwise all other systems reviewed and are negative.  Past Medical History:  Diagnosis Date   Abnormal Pap smear    ADD (attention deficit disorder)    Anxiety    Guillain Barr syndrome (HCC)    Headache(784.0)    HSV-1 infection    IBS (irritable bowel syndrome)    Knee hyperextension injury 2020   Left, being treated by ortho   Tachycardia     Past Surgical History:  Procedure Laterality Date    BREAST BIOPSY Right    BREAST SURGERY     Biopsy-benign   CESAREAN SECTION  09/13/2012   Procedure: CESAREAN SECTION;  Surgeon: Lavonia Drafts, MD;  Location: [facility];  Service: Obstetrics;  Laterality: N/A;   cortisone shot R wrist Right 2020   KYPHOPLASTY N/A 08/27/2013   Procedure: Thoracic Twelve Kyphoplasty;  Surgeon: Erline Levine, MD;  Location: [facility];  Service: Neurosurgery;  Laterality: N/A;  T12 Kyphoplasty   LAPAROSCOPIC TUBAL LIGATION Bilateral 10/21/2019   Procedure: LAPAROSCOPIC TUBAL LIGATION WITH CAUTERY;  Surgeon: Princess Bruins, MD;  Location: [facility];  Service: Gynecology;  Laterality: Bilateral;  Request to follow 2nd case in [place] block requests one hour   WISDOM TOOTH EXTRACTION     x4   WRIST SURGERY Right 07-23-2013    Social History  reports that she has been smoking cigarettes. She has a 5.00 pack-year smoking history. She has never used smokeless tobacco. She reports current alcohol use. She reports that she does not use drugs.  Allergies  Allergen Reactions   Tape Itching and Other (See Comments)    Clear, "plastic" tape is the culprit    Family History  Problem Relation Age of Onset   Depression Mother        chronic   Hearing loss Mother    Hyperlipidemia Mother    Vision loss Mother        beachets disease   COPD Father    Diabetes Father    Cancer Maternal  Grandmother 38       breast cancer   Parkinsonism Maternal Grandmother    Heart disease Maternal Grandmother    Breast cancer Maternal Grandmother    Alzheimer's disease Paternal Grandmother    Heart disease Paternal Grandfather        Died age 45   Nephrolithiasis Paternal Grandfather    Heart disease Maternal Grandfather    Cancer Other 57       breast   Breast cancer Other   Reviewed on admission  Prior to Admission medications   Medication Sig Start Date End Date Taking? Authorizing Provider  ALPRAZolam Duanne Moron) 0.5 MG tablet Take 0.5  mg by mouth 3 (three) times daily. 06/19/20  Yes [provider]  calcium carbonate (TUMS EX) 750 MG chewable tablet Chew 1-2 tablets by mouth as needed (for heartburn or indigestion).   Yes [provider]  escitalopram (LEXAPRO) 10 MG tablet Take 10 mg by mouth daily. 01/21/20  Yes [provider]  gabapentin (NEURONTIN) 300 MG capsule Take 300 mg by mouth 3 (three) times daily.   Yes [provider]  ibuprofen (ADVIL,MOTRIN) 200 MG tablet Take 800 mg by mouth every 6 (six) hours as needed for mild pain or moderate pain.    Yes [provider]  nitrofurantoin, macrocrystal-monohydrate, (MACROBID) 100 MG capsule Take 100 mg by mouth every 12 (twelve) hours. 11/30/21  Yes [provider]  pantoprazole (PROTONIX) 40 MG tablet TAKE 1 TABLET BY MOUTH DAILY. TAKE 30-60 MINUTES BEFORE BREAKFAST 08/15/19  Yes Danis, Estill Cotta III, MD  hydrocortisone cream (PREPARATION H) 1 % Apply 1 application topically as needed for itching. Patient not taking: Reported on 12/02/2021 07/08/20   Marny Lowenstein A, NP  ondansetron (ZOFRAN) 4 MG tablet Take every 4-6 hours as needed. Patient not taking: Reported on 12/02/2021 10/19/18   Levin Erp, PA  OXcarbazepine (TRILEPTAL) 150 MG tablet Take 1 tablet (150 mg total) by mouth 2 (two) times daily. Patient not taking: Reported on 12/02/2021 09/30/20   Lomax, Amy, NP  pregabalin (LYRICA) 200 MG capsule Take 1 capsule (200 mg total) by mouth 3 (three) times daily. Patient not taking: Reported on 12/02/2021 08/20/20   Debbora Presto, NP    Physical Exam: Vitals:   12/02/21 2245 12/02/21 2300 12/02/21 2315 12/02/21 2330  BP: 115/79 116/87  114/88  Pulse: (!) 108 (!) 108 (!) 107 100  Resp: 13 18 18 18   Temp:      TempSrc:      SpO2: 98% 95% 93% (!) 80%   Physical Exam Constitutional:      General: She is not in acute distress.    Appearance: Normal appearance.  HENT:     Head: Normocephalic and atraumatic.      Mouth/Throat:     Mouth: Mucous membranes are moist.     Pharynx: Oropharynx is clear.  Eyes:     Extraocular Movements: Extraocular movements intact.     Pupils: Pupils are equal, round, and reactive to light.  Cardiovascular:     Rate and Rhythm: Normal rate and regular rhythm.     Pulses: Normal pulses.     Heart sounds: Normal heart sounds.  Pulmonary:     Effort: Pulmonary effort is normal. No respiratory distress.     Breath sounds: Normal breath sounds.  Abdominal:     General: Bowel sounds are normal. There is no distension.     Palpations: Abdomen is soft.     Tenderness: There is  no abdominal tenderness.  Musculoskeletal:        General: No swelling or deformity.  Skin:    General: Skin is warm and dry.  Neurological:     Mental Status: She is oriented to person, place, and time.     Comments: Lower extremity strength 4 out of 5. Numbness from feet to mid thigh. Numbness of abdomen and chest   Labs on Admission: I have personally reviewed following labs and imaging studies  CBC: Recent Labs  Lab 12/02/21 1652 12/02/21 1911  WBC 10.3  --   NEUTROABS 6.5  --   HGB 15.3* 16.0*  HCT 44.0 47.0*  MCV 101.9*  --   PLT 309  --     Basic Metabolic Panel: Recent Labs  Lab 12/02/21 1911 12/02/21 1937  NA 136  --   K 2.8*  --   CL 101  --   GLUCOSE 96  --   BUN <3*  --   CREATININE 0.40*  --   MG  --  1.6*    GFR: CrCl cannot be calculated (Unknown ideal weight.).  Liver Function Tests: No results for input(s): AST, ALT, ALKPHOS, BILITOT, PROT, ALBUMIN in the last 168 hours.  Urine analysis:    Component Value Date/Time   COLORURINE BROWN (A) 10/24/2019 1211   APPEARANCEUR CLOUDY (A) 10/24/2019 1211   LABSPEC 1.025 10/24/2019 1211   PHURINE 6.0 10/24/2019 1211   GLUCOSEU NEGATIVE 10/24/2019 1211   HGBUR 2+ (A) 10/24/2019 1211   BILIRUBINUR NEGATIVE 12/06/2018 1629   KETONESUR NEGATIVE 10/24/2019 1211   PROTEINUR 1+ (A) 10/24/2019 1211    UROBILINOGEN 0.2 06/03/2014 1311   NITRITE NEGATIVE 12/06/2018 1629   LEUKOCYTESUR NEGATIVE 12/06/2018 1629    Radiological Exams on Admission: No results found.  EKG: Independently reviewed.  Sinus rhythm at 97 bpm.  Assessment/Plan Principal Problem:   Rapidly progressive weakness Active Problems:   Hypokalemia   Anxiety   GERD (gastroesophageal reflux disease)   Polyneuropathy   History of Guillain-Barre syndrome  Rapidly progressive weakness History of Guillain-Barr History of dry beriberi > Patient presenting with some progressive lower extremity and now torso weakness now established occultly walking.  Reported that it was similar to her previous episode of gonorrhea from a few years ago. > Did recently have a viral illness and flu vaccine. > Does have history of dry beriberi as well. > Neurology consulted in the ED and are following and have ordered some additional labs. > CFS showing normal protein glucose 85, cultures and cell count pending. - Appreciate neurology recommendations - Monitor on telemetry - Follow-up levels of copper, B12, B6, B3, B1, folate, vitamin E - Follow-up MRI of C-spine, T-spine, L-spine  Hypokalemia Hypomagnesemia > Magnesium noted to be 1.6 in the ED.  Potassium 2.8 on i-STAT. > Received IV magnesium and 20 mEq p.o. potassium. - Check formal BMP to further evaluate potassium - Trend electrolytes - Additional 40 mEq p.o. potassium now  UTI > Diagnosed outpatient - Continue outpatient Macrobid  Neuropathy - Continue home gabapentin - If continued pain, may need larger doses of gabapentin if this is due to [place].  GERD - Continue home PPI  Anxiety Depression - Continue home Xanax and Lexapro  DVT prophylaxis: Lovenox  Code Status:   Full  Family Communication:  None on admission  Disposition Plan:   Patient is from:  Home  Anticipated DC to:  Home  Anticipated DC date:  Pending clinical course  Anticipated DC  barriers:  None  Consults called:  Neurology, consulted by EDP.   Admission status:  Inpatient, telemetry   Severity of Illness: The appropriate patient status for this patient is INPATIENT. Inpatient status is judged to be reasonable and necessary in order to provide the required intensity of service to ensure the patient's safety. The patient's presenting symptoms, physical exam findings, and initial radiographic and laboratory data in the context of their chronic comorbidities is felt to place them at high risk for further clinical deterioration. Furthermore, it is not anticipated that the patient will be medically stable for discharge from the hospital within 2 midnights of admission.   * I certify that at the point of admission it is my clinical judgment that the patient will require inpatient hospital care spanning beyond 2 midnights from the point of admission due to high intensity of service, high risk for further deterioration and high frequency of surveillance required.Marcelyn Bruins MD [facility]  How to contact the [facility] Attending or Consulting provider [place] or covering provider during after hours [place], for this patient?   Check the care team in [facility] and look for a) attending/consulting [facility] provider listed and b) the [facility] team listed Log into [email or web address] and use [place]'s universal password to access. If you do not have the password, please contact the hospital operator. Locate the [facility] provider you are looking for under [facility] and page to a number that you can be directly reached. If you still have difficulty reaching the provider, please page the [facility] (Director on Call) for the Hospitalists listed on amion for assistance.  12/03/2021, 12:23 AM

## 2021-12-04 DIAGNOSIS — R531 Weakness: Secondary | ICD-10-CM | POA: Diagnosis not present

## 2021-12-04 LAB — BASIC METABOLIC PANEL
Anion gap: 9 (ref 5–15)
BUN: <5 mg/dL — ABNORMAL LOW (ref 6–20)
CO2: 17 mmol/L — ABNORMAL LOW (ref 22–32)
Calcium: 7.8 mg/dL — ABNORMAL LOW (ref 8.9–10.3)
Chloride: 113 mmol/L — ABNORMAL HIGH (ref 98–111)
Creatinine, Ser: 0.48 mg/dL (ref 0.44–1.00)
GFR, Estimated: >60 mL/min (ref >60)
Glucose, Bld: 60 mg/dL — ABNORMAL LOW (ref 70–99)
Potassium: 3.4 mmol/L — ABNORMAL LOW (ref 3.5–5.1)
Sodium: 139 mmol/L (ref 135–145)

## 2021-12-04 LAB — CBC
HCT: 33.7 % — ABNORMAL LOW (ref 36.0–46.0)
Hemoglobin: 11.2 g/dL — ABNORMAL LOW (ref 12.0–15.0)
MCH: 34.8 pg — ABNORMAL HIGH (ref 26.0–34.0)
MCHC: 33.2 g/dL (ref 30.0–36.0)
MCV: 104.7 fL — ABNORMAL HIGH (ref 80.0–100.0)
Platelets: 224 10*3/uL (ref 150–400)
RBC: 3.22 MIL/uL — ABNORMAL LOW (ref 3.87–5.11)
RDW: 12.9 % (ref 11.5–15.5)
WBC: 6.5 10*3/uL (ref 4.0–10.5)
nRBC: 0 % (ref 0.0–0.2)

## 2021-12-04 LAB — VITAMIN B1: Vitamin B1 (Thiamine): 74.8 nmol/L (ref 66.5–200.0)

## 2021-12-04 LAB — MAGNESIUM: Magnesium: 1.9 mg/dL (ref 1.7–2.4)

## 2021-12-04 LAB — MRSA NEXT GEN BY PCR, NASAL: MRSA by PCR Next Gen: NOT DETECTED

## 2021-12-04 MED ORDER — CYANOCOBALAMIN 1000 MCG/ML IJ SOLN
1000.0000 ug | INTRAMUSCULAR | Status: DC
  Administered 2021-12-04: 1000 ug via SUBCUTANEOUS
  Filled 2021-12-04: qty 1

## 2021-12-04 MED ORDER — IMMUNE GLOBULIN (HUMAN) 10 GM/100ML IV SOLN
400.0000 mg/kg | INTRAVENOUS | Status: AC
  Administered 2021-12-04 – 2021-12-08 (×5): 25 g via INTRAVENOUS
  Filled 2021-12-04 (×2): qty 50
  Filled 2021-12-04 (×3): qty 200
  Filled 2021-12-04: qty 250

## 2021-12-04 MED ORDER — FAMOTIDINE 20 MG PO TABS
20.0000 mg | ORAL_TABLET | Freq: Two times a day (BID) | ORAL | Status: DC
  Administered 2021-12-04 – 2021-12-09 (×11): 20 mg via ORAL
  Filled 2021-12-04 (×11): qty 1

## 2021-12-04 MED ORDER — POTASSIUM CHLORIDE CRYS ER 20 MEQ PO TBCR
40.0000 meq | EXTENDED_RELEASE_TABLET | Freq: Once | ORAL | Status: AC
  Administered 2021-12-04: 40 meq via ORAL
  Filled 2021-12-04: qty 2

## 2021-12-04 NOTE — Progress Notes (Signed)
Patient requesting to go down stairs with family. Notified MD. Per MD, patient can go downstairs with staff only.

## 2021-12-04 NOTE — Progress Notes (Signed)
Neurology Progress Note  Brief HPI: 36 yo patient with a history of GBS vs. 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  She has recently lost 75 pound unintentionally and reports eating poorly.  Subjective: Patient reports that weakness and numbness started in her feet and calves and has ascended to her hipbones with areas of numbness also in her hands, central abdomen and center of her chest.  She does have chronic numbness in her feet and fingertips but noted that it had ascended when she awoke on Wednesday.  She reports that she does have weakness in her lower extremities and that she can ambulate with a little difficulty.  Patient reports that she has lost 75 pounds most of it intentional but some additional unintentionally lately after the death of her mother over in 05-05-2023 and states that she eats possibly enough for one meal throughout the day.  She reports early satiety and states that she will at times vomit in the morning after coughing but not after meals.  Patient states that she has noticed her hair falling out and that she takes biotin and a multivitamin daily. Reports chronic diarrhea. On further questioning, reports excessive alcohol consumption for a long time.  Exam: Vitals:   12/04/21 0844 12/04/21 1230  BP: 96/78 102/74  Pulse: 77 97  Resp: 16 14  Temp: 97.6 F (36.4 C) 97.7 F (36.5 C)  SpO2: 100% 98%   Gen: In bed, NAD Resp: non-labored breathing, no acute distress  Neuro: Mental Status: alert and oriented x3, naming and repetition intact, speech fluent Cranial Nerves:EOMI, smile symmetrical, tongue midline, facial sensation decreased to tough, sharp/dull and vibration on the right Motor: Able to move all extremities with 5/5 strength Sensory:Diminished to light touch, vibration, sharp/dull in bilateral calves and thighs.  Some  sensation to cold present in bilateral lower legs with increasing sensation in thighs. Sensation to sharp/dull diminished in right hand, intact to cold, light touch and vibration.  Proprioception sense absent in right foot but present for upward movement but not downward in left. DTR: 2+ triceps, 1+ brachioradialis, absent patellar and achilles Gait: steady but somewhat clumsy with difficulties standing on heels or toes  Pertinent Labs: BMP Latest Ref Rng & Units 12/04/2021 12/03/2021 12/03/2021  Glucose 70 - 99 mg/dL 60(L) 87 122(H)  BUN 6 - 20 mg/dL <5(L) <5(L) <5(L)  Creatinine 0.44 - 1.00 mg/dL 0.48 0.63 0.79  Sodium 135 - 145 mmol/L 139 134(L) 135  Potassium 3.5 - 5.1 mmol/L 3.4(L) 3.9 2.6(LL)  Chloride 98 - 111 mmol/L 113(H) 101 98  CO2 22 - 32 mmol/L 17(L) 23 22  Calcium 8.9 - 10.3 mg/dL 7.8(L) 8.5(L) 8.8(L)    CBC Latest Ref Rng & Units 12/04/2021 12/03/2021 12/02/2021  WBC 4.0 - 10.5 K/uL 6.5 7.3 -  Hemoglobin 12.0 - 15.0 g/dL 11.2(L) 13.5 16.0(H)  Hematocrit 36.0 - 46.0 % 33.7(L) 39.0 47.0(H)  Platelets 150 - 400 K/uL 224 278 -    Folate 3.0 Vitamin B12 437 Imaging Reviewed: MRI brain-no acute changes MRI C-spine-no acute changes MRI T-spine and L-spine with remote and healed fractures of T1, T2-T3 and T12 vertebral bodies.  Assessment: 36 yo patient with history of GBS vs. Thiamine deficiency presents with numbness and weakness of lower extremities ascending to abdomen, chest and hands.  LP reveals protein of 18, 1 WBC and 85 RBCs.  MRI of spine reveals  no acute abnormality to explain patient's symptoms.  Folate is 3.0.  Will replete folate and thiamine and consider course of IVIG if symptoms do not improve as patient has responded to this in the past and albumin no cytological dissociation may not be present earlier in the disease course of a demyelinating polyneuropathy. My strong suspicion is that it is either one of the demyelinating polyneuropathy is or it is a nutritional  deficiency or alcoholic neuropathy given heavy alcohol use history, recent weight loss and poor p.o. intake.   Recommendations: 1)Replete folate 2)Replete thiamine. Level pending. 3)Consider GI consult to evaluate for malabsorption given IBS, weight loss and nutritional deficiencies identified on labs 4)IVIG was discussed with risks and benefits - she would ike to proceed. I will order 5)Follow up with pending labs  [place] , MSN, AGACNP-BC [facility] See Amion for schedule and pager information 12/04/2021 1:48 PM    Attending Neurohospitalist Addendum Patient seen and examined with APP/Resident. Agree with the history and physical as documented above. Agree with the plan as documented, which I helped formulate. I have independently reviewed the chart, obtained history, review of systems and examined the patient.I have personally reviewed pertinent head/neck/spine imaging (CT/MRI). Please feel free to call with any questions.  -- Amie [place], MD Neurologist [facility] Pager: [phone]

## 2021-12-04 NOTE — Evaluation (Signed)
Occupational Therapy Evaluation Patient Details Name: Kimberly [name] MRN: [id number] DOB: Aug 29, 1986 Today's Date: 12/04/2021   History of Present Illness Kimberly [name] is a 36 y.o. female who presented after 2 days of worsening numbness in her lower extremities. Pt has chronic residual LE parethesias from GBS 11/2020. PMHx: GBS 2020, ADD, headache, HSV-1, tachycardia, dry beriberi   Clinical Impression   Kimberly [name] was indep PTA, working and driving. She lives in a 1 level home, ramped entrance with her husband and twin 2 year old boys. Upon evaluation pt demonstrated indep/mod I ADLs and functional mobility with RW despite BLE and trunk paresthesias. Pt has great insight to safety and her deficits. Pt does not have acute OT needs, encourage pt to complete self care and mobilize in the room while in acute care. Recommend d/c home without OT follow up.     Recommendations for follow up therapy are one component of a multi-disciplinary discharge planning process, led by the attending physician.  Recommendations may be updated based on patient status, additional functional criteria and insurance authorization.   Follow Up Recommendations  No OT follow up    Assistance Recommended at Discharge Intermittent Supervision/Assistance  Patient can return home with the following Assist for transportation    Functional Status Assessment  Patient has had a recent decline in their functional status and demonstrates the ability to make significant improvements in function in a reasonable and predictable amount of time.  Equipment Recommendations  None recommended by OT       Precautions / Restrictions Precautions Precautions: Fall Restrictions Weight Bearing Restrictions: No      Mobility Bed Mobility Overal bed mobility: Independent      Transfers Overall transfer level: Independent      Balance Overall balance assessment: No apparent balance deficits (not formally assessed),  Modified Independent       ADL either performed or assessed with clinical judgement   ADL Overall ADL's : Modified independent       General ADL Comments: Pt demonstrated mod I ADLs, use of RW this session for safety only. Pt likely to do well in the room with out AD. Despite BLE numbness, pt ambulated the entire unit without LOB or buckle, navigated obstacles well with good safety awareness.     Vision Baseline Vision/History: 1 Wears glasses Ability to See in Adequate Light: 0 Adequate Patient Visual Report: No change from baseline Vision Assessment?: No apparent visual deficits     Perception Perception Comments: WFL       Pertinent Vitals/Pain Pain Assessment Pain Assessment: No/denies pain     Hand Dominance Right   Extremity/Trunk Assessment Upper Extremity Assessment Upper Extremity Assessment: Overall WFL for tasks assessed   Lower Extremity Assessment Lower Extremity Assessment: Defer to PT evaluation (numb/parestheias from trunk down to toes)   Cervical / Trunk Assessment Cervical / Trunk Assessment: Normal;Other exceptions Cervical / Trunk Exceptions: numbness throughout   Communication Communication Communication: No difficulties   Cognition Arousal/Alertness: Awake/alert Behavior During Therapy: Anxious, WFL for tasks assessed/performed Overall Cognitive Status: Within Functional Limits for tasks assessed       General Comments: pt reports feeling "overwhelmed" but overall cognition is [facility]     General Comments  VSS on RA, husband present            Home Living Family/patient expects to be discharged to:: Private residence Living Arrangements: Spouse/significant other;Children Available Help at Discharge: Family;Available 24 hours/day Type of Home: House Home Access: Ramped entrance  Home Layout: One level         Bathroom Toilet: Standard     Home Equipment: Cane - single Associate Professor (2 wheels)   Additional  Comments: twin 8 year old boys & husband      Prior Functioning/Environment Prior Level of Function : Independent/Modified Independent;Working/employed;Driving             Mobility Comments: indep 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, drives        OT Problem List: Impaired balance (sitting and/or standing)         OT Goals(Current goals can be found in the care plan section) Acute Rehab OT Goals Patient Stated Goal: home soon OT Goal Formulation: All assessment and education complete, DC therapy Time For Goal Achievement: 12/04/21  OT Frequency:         AM-PAC OT "6 Clicks" Daily Activity     Outcome Measure Help from another person eating meals?: None Help from another person taking care of personal grooming?: None Help from another person toileting, which includes using toliet, bedpan, or urinal?: None Help from another person bathing (including washing, rinsing, drying)?: None Help from another person to put on and taking off regular upper body clothing?: None Help from another person to put on and taking off regular lower body clothing?: None 6 Click Score: 24   End of Session Equipment Utilized During Treatment: Rolling walker (2 wheels);Gait belt Nurse Communication: Mobility status (pt is indep with mobility)  Activity Tolerance: Patient tolerated treatment well Patient left: in bed;with call bell/phone within reach;with family/visitor present  OT Visit Diagnosis: Other abnormalities of gait and mobility (R26.89);Other symptoms and signs involving the nervous system (Y92.446)                Time: 2863-8177 OT Time Calculation (min): 17 min Charges:  OT General Charges $OT Visit: 1 Visit OT Evaluation $OT Eval Moderate Complexity: 1 Mod   Kimberly [name] 12/04/2021, 3:11 PM

## 2021-12-04 NOTE — Evaluation (Signed)
Physical Therapy Evaluation Patient Details Name: Kimberly [name] MRN: [id number] DOB: 02-Jun-1986 Today's Date: 12/04/2021  History of Present Illness  Kimberly [name] is a 36 y.o. female who presented after 2 days of worsening numbness in her lower extremities. Pt has chronic residual LE parethesias from GBS 11/2020. IVIG pending. PMHx: GBS 2020, ADD, headache, HSV-1, tachycardia, dry beriberi  Clinical Impression  Pt admitted with/for worsening parasthesias, IVIG pending.  Pt is generally independent, but with some incoordination and mild fall risk..  Pt currently limited functionally due to the problems listed below.  (see problems list.)  Pt will benefit from PT to maximize function and safety to be able to get home safely with available assist .        Recommendations for follow up therapy are one component of a multi-disciplinary discharge planning process, led by the attending physician.  Recommendations may be updated based on patient status, additional functional criteria and insurance authorization.  Follow Up Recommendations No PT follow up    Assistance Recommended at Discharge PRN  Patient can return home with the following  Assist for transportation;Assistance with cooking/housework    Equipment Recommendations None recommended by PT  Recommendations for Other Services       Functional Status Assessment Patient has had a recent decline in their functional status and demonstrates the ability to make significant improvements in function in a reasonable and predictable amount of time.     Precautions / Restrictions Precautions Precautions: Fall Restrictions Weight Bearing Restrictions: No      Mobility  Bed Mobility Overal bed mobility: Independent                  Transfers Overall transfer level: Independent                      Ambulation/Gait Ambulation/Gait assistance: Supervision Gait Distance (Feet): 600 Feet Assistive device:  None Gait Pattern/deviations: Step-through pattern   Gait velocity interpretation: 1.31 - 2.62 ft/sec, indicative of limited community ambulator   General Gait Details: minor deviations and incoordination of gait, but functional if speed not age appropriate and pt needs to hold the rail up/down the stairs.  Stairs Stairs: Yes Stairs assistance: Min guard Stair Management: One rail Left, Step to pattern, Alternating pattern, Forwards Number of Stairs: 10 General stair comments: rail for safety  Wheelchair Mobility    Modified Rankin (Stroke Patients Only)       Balance Overall balance assessment: Modified Independent, Mild deficits observed, not formally tested                               Standardized Balance Assessment Standardized Balance Assessment : Dynamic Gait Index   Dynamic Gait Index Level Surface: Normal Change in Gait Speed: Mild Impairment Gait with Horizontal Head Turns: Mild Impairment Gait with Vertical Head Turns: Mild Impairment Gait and Pivot Turn: Normal Step Over Obstacle: Normal Step Around Obstacles: Mild Impairment Steps: Mild Impairment Total Score: 19       Pertinent Vitals/Pain Pain Assessment Pain Assessment: No/denies pain    Home Living Family/patient expects to be discharged to:: Private residence Living Arrangements: Spouse/significant other;Children Available Help at Discharge: Family;Available 24 hours/day Type of Home: House Home Access: Ramped entrance       Home Layout: One level Home Equipment: [place] - single Associate Professor (2 wheels) Additional Comments: twin 36 year old boys & husband    Prior Function  Prior Level of Function : Independent/Modified Independent;Working/employed;Driving             Mobility Comments: indep 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, drives     Journalist, newspaper   Dominant Hand: Right    Extremity/Trunk Assessment   Upper Extremity  Assessment Upper Extremity Assessment: Overall WFL for tasks assessed    Lower Extremity Assessment Lower Extremity Assessment: Overall WFL for tasks assessed (parasthesias and minor incoordination)    Cervical / Trunk Assessment Cervical / Trunk Assessment: Normal;Other exceptions Cervical / Trunk Exceptions: numbness throughout  Communication   Communication: No difficulties  Cognition Arousal/Alertness: Awake/alert Behavior During Therapy: Anxious, WFL for tasks assessed/performed Overall Cognitive Status: Within Functional Limits for tasks assessed                                 General Comments: pt reports feeling "overwhelmed" but overall cognition is [name]        General Comments General comments (skin integrity, edema, etc.): vss on RA, score of 19 on DGI suggest some level of fall risk.    Exercises     Assessment/Plan    PT Assessment Patient needs continued PT services  PT Problem List Decreased activity tolerance;Decreased balance;Decreased coordination       PT Treatment Interventions Gait training;Stair training;Functional mobility training;Therapeutic activities;Balance training;Patient/family education    PT Goals (Current goals can be found in the Care Plan section)  Acute Rehab PT Goals Patient Stated Goal: back to PLOF PT Goal Formulation: With patient Time For Goal Achievement: 12/10/21 Potential to Achieve Goals: Good    Frequency Min 2X/week     Co-evaluation               AM-PAC PT "6 Clicks" Mobility  Outcome Measure Help needed turning from your back to your side while in a flat bed without using bedrails?: None Help needed moving from lying on your back to sitting on the side of a flat bed without using bedrails?: None Help needed moving to and from a bed to a chair (including a wheelchair)?: None Help needed standing up from a chair using your arms (e.g., wheelchair or bedside chair)?: None Help needed to walk in  hospital room?: A Little Help needed climbing 3-5 steps with a railing? : A Little 6 Click Score: 22    End of Session   Activity Tolerance: Patient tolerated treatment well Patient left: in bed;with call bell/phone within reach Nurse Communication: Mobility status PT Visit Diagnosis: Unsteadiness on feet (R26.81);Other abnormalities of gait and mobility (R26.89)    Time: 7124-5809 PT Time Calculation (min) (ACUTE ONLY): 18 min   Charges:   PT Evaluation $PT Eval Moderate Complexity: 1 Mod          12/04/2021  Ginger Carne., PT Acute Rehabilitation Services [phone]  (pager) [phone]  (office)  Kimberly [name] 12/04/2021, 5:18 PM

## 2021-12-04 NOTE — Progress Notes (Signed)
PROGRESS NOTE        PATIENT DETAILS Name: Kimberly [name] Age: 36 y.o. Sex: female Date of Birth: 1986/08/07 Admit Date: 12/02/2021 Admitting Physician Marcelyn Bruins, MD LZJ:QBHAL, Purcell Nails, MD  Brief Summary: 36 year old with history of GBS January 2020 (Tx w IVIG)-subsequently hospitalized a few days later when her thiamine levels came back severely low (Tx w high dose IV Thiamine)-subsequently left with some chronic residual paresthesias-presented to the ED on 12/02/2021 with rapidly progressive paresthesias  Significant events: 1/26>> admit to [facility] for evaluation of rapidly progressive paresthesia.  Significant imaging studies: 1/27>> C-spine/T-spine/L-spine MRI: No cord lesions-no root enhancements.  Procedures: 1/26>> lumbar puncture by ED MD.  Significant studies: 1/26>>TSH: 2.54 (normal) 1/26>> CSF protein 18, WBC 1 1/26>> vitamin B1: Pending 1/27>> RPR/B3/B6/copper: Pending 1/27>> folate: 3 (low) 1/27>> vitamin B12: 437  Microbiology data: 1/27>> COVID/flu PCR: Negative 1/27>> HIV: Nonreactive 1/26>> CSF culture: Pending   Subjective:  Patient in bed, appears comfortable, denies any headache, no fever, no chest pain or pressure, no shortness of breath , no abdominal pain. No new focal weakness, bilat leg tingling.   Objective: Vitals: Blood pressure 96/78, pulse 77, temperature 97.6 F (36.4 C), temperature source Oral, resp. rate 16, height 5\' 7"  (1.702 m), weight 59.3 kg, SpO2 100 %.   Exam:  Awake Alert, No new F.N deficits,  [place].AT,PERRAL Supple Neck, No JVD,   Symmetrical Chest wall movement, Good air movement bilaterally, CTAB RRR,No Gallops, Rubs or new Murmurs,  +ve B.Sounds, Abd Soft, No tenderness,   No Cyanosis, Clubbing or edema    Pertinent Labs/Radiology: CBC Latest Ref Rng & Units 12/04/2021 12/03/2021 12/02/2021  WBC 4.0 - 10.5 K/uL 6.5 7.3 -  Hemoglobin 12.0 - 15.0 g/dL 11.2(L) 13.5 16.0(H)  Hematocrit  36.0 - 46.0 % 33.7(L) 39.0 47.0(H)  Platelets 150 - 400 K/uL 224 278 -    Lab Results  Component Value Date   NA 139 12/04/2021   K 3.4 (L) 12/04/2021   CL 113 (H) 12/04/2021   CO2 17 (L) 12/04/2021      Assessment/Plan: * Rapidly progressive ascending paresthesias Rapidly progressive bilateral lower extremity ascending paresthesias with known past medical history of beriberi and Guillain-Barr syndrome.  Currently being seen by neurology, on IV thiamine and folic acid supplementation, monitor pending micronutrient levels, case discussed with neurology if no more improvement in couple of days then may get IVIG trial, continue supportive care.  MRI of the spine unremarkable, stable CSF.     History of Guillain-Barre syndrome January 2020 CSF studies/MRI spine not consistent with GBS-neurology following-we will defer initiation of IVIG to the neurology.  History of dry beriberi January 2020- (present on admission) On IV thiamine empirically-awaiting thiamine levels.  Folic acid deficiency- (present on admission) Continue to supplement.  Unclear if this is the cause of her presenting paresthesias.  Polyneuropathy Per patient-she has some residual neuropathy from her prior episode of GBS-remains on Neurontin.  GERD (gastroesophageal reflux disease)- (present on admission) Continue PPI cautiously-see above  Anxiety/depression- (present on admission) Continue Xanax/Lexapro.  Hypokalemia/hypomagnesemia- (present on admission) Repleted.?  Discontinue Protonix and switch to Pepcid, has intermittent diarrhea nausea vomiting at baseline under the care of Dr. Loletha Carrow.  Will monitor for now with supportive care.  Replace electrolytes.   BMI: Estimated body mass index is 20.48 kg/m as calculated from the following:  Height as of this encounter: 5\' 7"  (1.702 m).   Weight as of this encounter: 59.3 kg.    DVT Prophylaxis: SQ Lovenox Procedures: None Consults: Neurology Code  Status:Full code Family Communication: None at bedside   Disposition Plan: Status is: Inpatient  Remains inpatient appropriate because: Ascending paresthesias with weakness-work-up in progress-not yet stable for discharge.   Diet: Diet Order             Diet regular Room service appropriate? Yes; Fluid consistency: Thin  Diet effective now                     Antimicrobial agents: Anti-infectives (From admission, onward)    Start     Dose/Rate Route Frequency Ordered Stop   12/03/21 1000  nitrofurantoin (macrocrystal-monohydrate) (MACROBID) capsule 100 mg  Status:  Discontinued        100 mg Oral 2 times daily 12/03/21 0000 12/03/21 1008        MEDICATIONS: Scheduled Meds:  ALPRAZolam  0.5 mg Oral TID   cyanocobalamin  1,000 mcg Subcutaneous Q30 days   enoxaparin (LOVENOX) injection  40 mg Subcutaneous Daily   escitalopram  10 mg Oral Daily   famotidine  20 mg Oral BID   feeding supplement  237 mL Oral BID BM   folic acid  2 mg Intravenous Daily   gabapentin  400 mg Oral TID   sodium chloride flush  3 mL Intravenous Q12H   [START ON 12/11/2021] thiamine injection  100 mg Intravenous Daily   Continuous Infusions:  thiamine injection 500 mg (12/04/21 0509)   Followed by   Derrill Memo ON 12/05/2021] thiamine injection     PRN Meds:.acetaminophen **OR** acetaminophen, fentaNYL (SUBLIMAZE) injection, HYDROcodone-acetaminophen, naLOXone (NARCAN)  injection   I have personally reviewed following labs and imaging studies  LABORATORY DATA: CBC: Recent Labs  Lab 12/02/21 1652 12/02/21 1911 12/03/21 0517 12/04/21 0044  WBC 10.3  --  7.3 6.5  NEUTROABS 6.5  --   --   --   HGB 15.3* 16.0* 13.5 11.2*  HCT 44.0 47.0* 39.0 33.7*  MCV 101.9*  --  101.3* 104.7*  PLT 309  --  278 867    Basic Metabolic Panel: Recent Labs  Lab 12/02/21 1911 12/02/21 1937 12/03/21 0036 12/03/21 0517 12/04/21 0044  NA 136  --  135 134* 139  K 2.8*  --  2.6* 3.9 3.4*  CL 101  --   98 101 113*  CO2  --   --  22 23 17*  GLUCOSE 96  --  122* 87 60*  BUN <3*  --  <5* <5* <5*  CREATININE 0.40*  --  0.79 0.63 0.48  CALCIUM  --   --  8.8* 8.5* 7.8*  MG  --  1.6*  --  2.2 1.9    GFR: Estimated Creatinine Clearance: 90.1 mL/min (by C-G formula based on SCr of 0.48 mg/dL).  Liver Function Tests: Recent Labs  Lab 12/03/21 0517  AST 81*  ALT 34  ALKPHOS 124  BILITOT 2.7*  PROT 5.8*  ALBUMIN 3.0*   No results for input(s): LIPASE, AMYLASE in the last 168 hours. No results for input(s): AMMONIA in the last 168 hours.  Coagulation Profile: No results for input(s): INR, PROTIME in the last 168 hours.  Cardiac Enzymes: No results for input(s): CKTOTAL, CKMB, CKMBINDEX, TROPONINI in the last 168 hours.  BNP (last 3 results) No results for input(s): PROBNP in the last 8760 hours.  Lipid Profile:  No results for input(s): CHOL, HDL, LDLCALC, TRIG, CHOLHDL, LDLDIRECT in the last 72 hours.  Thyroid Function Tests: Recent Labs    12/02/21 1652  TSH 2.548    Anemia Panel: Recent Labs    12/03/21 0036  VITAMINB12 437  FOLATE 3.0*    Urine analysis:    Component Value Date/Time   COLORURINE BROWN (A) 10/24/2019 1211   APPEARANCEUR CLOUDY (A) 10/24/2019 1211   LABSPEC 1.025 10/24/2019 1211   PHURINE 6.0 10/24/2019 1211   GLUCOSEU NEGATIVE 10/24/2019 1211   HGBUR 2+ (A) 10/24/2019 1211   BILIRUBINUR NEGATIVE 12/06/2018 1629   KETONESUR NEGATIVE 10/24/2019 1211   PROTEINUR 1+ (A) 10/24/2019 1211   UROBILINOGEN 0.2 06/03/2014 1311   NITRITE NEGATIVE 12/06/2018 1629   LEUKOCYTESUR NEGATIVE 12/06/2018 1629    Sepsis Labs: Lactic Acid, Venous    Component Value Date/Time   LATICACIDVEN 2.85 (HH) 11/16/2018 1118    RADIOLOGY STUDIES/RESULTS: MR CERVICAL SPINE W WO CONTRAST  Result Date: 12/03/2021 CLINICAL DATA:  Acute myelopathy. History of Guillain-Barre. Progressive weakness and numbness. CSF leak suspected EXAM: MRI CERVICAL, THORACIC AND  LUMBAR SPINE WITHOUT AND WITH CONTRAST TECHNIQUE: Multiplanar and multiecho pulse sequences of the cervical spine, to include the craniocervical junction and cervicothoracic junction, and thoracic and lumbar spine, were obtained without and with intravenous contrast. CONTRAST:  88mL GADAVIST GADOBUTROL 1 MMOL/ML IV SOLN COMPARISON:  11/16/2018 FINDINGS: MRI CERVICAL SPINE FINDINGS Alignment: Normal Vertebrae: No acute fracture, evidence of discitis, or bone lesion. Cord: Normal signal and morphology. No evidence of intrathecal collection. No dorsal column signal abnormality. Posterior Fossa, vertebral arteries, paraspinal tissues: No evidence of perispinal mass or inflammation. Right anterior lobe thyroid nodule measuring 9 mm. No followup recommended (ref: J Am Coll Radiol. 2015 Feb;12(2): 143-50). Disc levels: C2-3: Unremarkable. C3-4: Unremarkable. C4-5: Minor disc bulging C5-6: Small central disc protrusion. C6-7: Minor disc bulging C7-T1:Unremarkable. MRI THORACIC SPINE FINDINGS Alignment: Mild anterolisthesis at T11-12. Prominent inter spinous with at this level but no facet subluxation. Vertebrae: Remote T12 compression fracture with cement augmentation. Remote superior endplate fractures at U1-L2. Cord:  Normal signal and morphology.  No spinal canal collection. Paraspinal and other soft tissues: Negative Disc levels: T11-12 disc space narrowing. T6-7 disc space narrowing. No neural impingement. MRI LUMBAR SPINE FINDINGS Segmentation:  5 lumbar type vertebrae Alignment:  Normal Vertebrae:  No fracture, evidence of discitis, or bone lesion. Conus medullaris and cauda equina: Conus extends to the L1-2 level. Conus and cauda equina appear normal. Paraspinal and other soft tissues: No perispinal mass or inflammation. Disc levels: Mild disc desiccation and narrowing with annular fissure and shallow protrusion at L5-S1. No neural compression. IMPRESSION: 1. Normal MRI of the cord and cauda equina. No CSF collection  to implicate leak. 2. No neural impingement. 3. Remote and healed fractures of T1, T2, T3, and T12 vertebral bodies. Electronically Signed   By: Jorje Guild M.D.   On: 12/03/2021 05:01   MR THORACIC SPINE W WO CONTRAST  Result Date: 12/03/2021 CLINICAL DATA:  Acute myelopathy. History of Guillain-Barre. Progressive weakness and numbness. CSF leak suspected EXAM: MRI CERVICAL, THORACIC AND LUMBAR SPINE WITHOUT AND WITH CONTRAST TECHNIQUE: Multiplanar and multiecho pulse sequences of the cervical spine, to include the craniocervical junction and cervicothoracic junction, and thoracic and lumbar spine, were obtained without and with intravenous contrast. CONTRAST:  67mL GADAVIST GADOBUTROL 1 MMOL/ML IV SOLN COMPARISON:  11/16/2018 FINDINGS: MRI CERVICAL SPINE FINDINGS Alignment: Normal Vertebrae: No acute fracture, evidence of discitis, or bone lesion.  Cord: Normal signal and morphology. No evidence of intrathecal collection. No dorsal column signal abnormality. Posterior Fossa, vertebral arteries, paraspinal tissues: No evidence of perispinal mass or inflammation. Right anterior lobe thyroid nodule measuring 9 mm. No followup recommended (ref: J Am Coll Radiol. 2015 Feb;12(2): 143-50). Disc levels: C2-3: Unremarkable. C3-4: Unremarkable. C4-5: Minor disc bulging C5-6: Small central disc protrusion. C6-7: Minor disc bulging C7-T1:Unremarkable. MRI THORACIC SPINE FINDINGS Alignment: Mild anterolisthesis at T11-12. Prominent inter spinous with at this level but no facet subluxation. Vertebrae: Remote T12 compression fracture with cement augmentation. Remote superior endplate fractures at Y7-C6. Cord:  Normal signal and morphology.  No spinal canal collection. Paraspinal and other soft tissues: Negative Disc levels: T11-12 disc space narrowing. T6-7 disc space narrowing. No neural impingement. MRI LUMBAR SPINE FINDINGS Segmentation:  5 lumbar type vertebrae Alignment:  Normal Vertebrae:  No fracture, evidence of  discitis, or bone lesion. Conus medullaris and cauda equina: Conus extends to the L1-2 level. Conus and cauda equina appear normal. Paraspinal and other soft tissues: No perispinal mass or inflammation. Disc levels: Mild disc desiccation and narrowing with annular fissure and shallow protrusion at L5-S1. No neural compression. IMPRESSION: 1. Normal MRI of the cord and cauda equina. No CSF collection to implicate leak. 2. No neural impingement. 3. Remote and healed fractures of T1, T2, T3, and T12 vertebral bodies. Electronically Signed   By: Jorje Guild M.D.   On: 12/03/2021 05:01   MR Lumbar Spine W Wo Contrast  Result Date: 12/03/2021 CLINICAL DATA:  Acute myelopathy. History of Guillain-Barre. Progressive weakness and numbness. CSF leak suspected EXAM: MRI CERVICAL, THORACIC AND LUMBAR SPINE WITHOUT AND WITH CONTRAST TECHNIQUE: Multiplanar and multiecho pulse sequences of the cervical spine, to include the craniocervical junction and cervicothoracic junction, and thoracic and lumbar spine, were obtained without and with intravenous contrast. CONTRAST:  73mL GADAVIST GADOBUTROL 1 MMOL/ML IV SOLN COMPARISON:  11/16/2018 FINDINGS: MRI CERVICAL SPINE FINDINGS Alignment: Normal Vertebrae: No acute fracture, evidence of discitis, or bone lesion. Cord: Normal signal and morphology. No evidence of intrathecal collection. No dorsal column signal abnormality. Posterior Fossa, vertebral arteries, paraspinal tissues: No evidence of perispinal mass or inflammation. Right anterior lobe thyroid nodule measuring 9 mm. No followup recommended (ref: J Am Coll Radiol. 2015 Feb;12(2): 143-50). Disc levels: C2-3: Unremarkable. C3-4: Unremarkable. C4-5: Minor disc bulging C5-6: Small central disc protrusion. C6-7: Minor disc bulging C7-T1:Unremarkable. MRI THORACIC SPINE FINDINGS Alignment: Mild anterolisthesis at T11-12. Prominent inter spinous with at this level but no facet subluxation. Vertebrae: Remote T12 compression  fracture with cement augmentation. Remote superior endplate fractures at C3-J6. Cord:  Normal signal and morphology.  No spinal canal collection. Paraspinal and other soft tissues: Negative Disc levels: T11-12 disc space narrowing. T6-7 disc space narrowing. No neural impingement. MRI LUMBAR SPINE FINDINGS Segmentation:  5 lumbar type vertebrae Alignment:  Normal Vertebrae:  No fracture, evidence of discitis, or bone lesion. Conus medullaris and cauda equina: Conus extends to the L1-2 level. Conus and cauda equina appear normal. Paraspinal and other soft tissues: No perispinal mass or inflammation. Disc levels: Mild disc desiccation and narrowing with annular fissure and shallow protrusion at L5-S1. No neural compression. IMPRESSION: 1. Normal MRI of the cord and cauda equina. No CSF collection to implicate leak. 2. No neural impingement. 3. Remote and healed fractures of T1, T2, T3, and T12 vertebral bodies. Electronically Signed   By: Jorje Guild M.D.   On: 12/03/2021 05:01     LOS: 1 day  Signature  Lala Lund M.D on 12/04/2021 at 8:59 AM   -  To page go to [email or web address]

## 2021-12-05 DIAGNOSIS — R531 Weakness: Secondary | ICD-10-CM | POA: Diagnosis not present

## 2021-12-05 LAB — COMPREHENSIVE METABOLIC PANEL
ALT: 30 U/L (ref 0–44)
AST: 59 U/L — ABNORMAL HIGH (ref 15–41)
Albumin: 2.4 g/dL — ABNORMAL LOW (ref 3.5–5.0)
Alkaline Phosphatase: 94 U/L (ref 38–126)
Anion gap: 12 (ref 5–15)
BUN: <5 mg/dL — ABNORMAL LOW (ref 6–20)
CO2: 18 mmol/L — ABNORMAL LOW (ref 22–32)
Calcium: 7.9 mg/dL — ABNORMAL LOW (ref 8.9–10.3)
Chloride: 109 mmol/L (ref 98–111)
Creatinine, Ser: 0.53 mg/dL (ref 0.44–1.00)
GFR, Estimated: >60 mL/min (ref >60)
Glucose, Bld: 79 mg/dL (ref 70–99)
Potassium: 3.3 mmol/L — ABNORMAL LOW (ref 3.5–5.1)
Sodium: 139 mmol/L (ref 135–145)
Total Bilirubin: 0.9 mg/dL (ref 0.3–1.2)
Total Protein: 5.5 g/dL — ABNORMAL LOW (ref 6.5–8.1)

## 2021-12-05 LAB — CBC WITH DIFFERENTIAL/PLATELET
Abs Immature Granulocytes: 0.02 10*3/uL (ref 0.00–0.07)
Basophils Absolute: 0 10*3/uL (ref 0.0–0.1)
Basophils Relative: 1 %
Eosinophils Absolute: 0.1 10*3/uL (ref 0.0–0.5)
Eosinophils Relative: 3 %
HCT: 34.3 % — ABNORMAL LOW (ref 36.0–46.0)
Hemoglobin: 11 g/dL — ABNORMAL LOW (ref 12.0–15.0)
Immature Granulocytes: 1 %
Lymphocytes Relative: 36 %
Lymphs Abs: 1.6 10*3/uL (ref 0.7–4.0)
MCH: 34.8 pg — ABNORMAL HIGH (ref 26.0–34.0)
MCHC: 32.1 g/dL (ref 30.0–36.0)
MCV: 108.5 fL — ABNORMAL HIGH (ref 80.0–100.0)
Monocytes Absolute: 0.3 10*3/uL (ref 0.1–1.0)
Monocytes Relative: 7 %
Neutro Abs: 2.3 10*3/uL (ref 1.7–7.7)
Neutrophils Relative %: 52 %
Platelets: 195 10*3/uL (ref 150–400)
RBC: 3.16 MIL/uL — ABNORMAL LOW (ref 3.87–5.11)
RDW: 12.9 % (ref 11.5–15.5)
WBC: 4.4 10*3/uL (ref 4.0–10.5)
nRBC: 0 % (ref 0.0–0.2)

## 2021-12-05 LAB — MAGNESIUM: Magnesium: 1.5 mg/dL — ABNORMAL LOW (ref 1.7–2.4)

## 2021-12-05 LAB — BRAIN NATRIURETIC PEPTIDE: B Natriuretic Peptide: 150.5 pg/mL — ABNORMAL HIGH (ref 0.0–100.0)

## 2021-12-05 LAB — COPPER, SERUM: Copper: 82 ug/dL (ref 80–158)

## 2021-12-05 MED ORDER — MAGNESIUM SULFATE 4 GM/100ML IV SOLN
4.0000 g | Freq: Once | INTRAVENOUS | Status: AC
  Administered 2021-12-05: 4 g via INTRAVENOUS
  Filled 2021-12-05: qty 100

## 2021-12-05 MED ORDER — ASPIRIN EC 81 MG PO TBEC
81.0000 mg | DELAYED_RELEASE_TABLET | Freq: Every day | ORAL | Status: AC
  Administered 2021-12-05 – 2021-12-09 (×5): 81 mg via ORAL
  Filled 2021-12-05 (×5): qty 1

## 2021-12-05 MED ORDER — POTASSIUM CHLORIDE CRYS ER 20 MEQ PO TBCR
40.0000 meq | EXTENDED_RELEASE_TABLET | Freq: Two times a day (BID) | ORAL | Status: AC
  Administered 2021-12-05 (×2): 40 meq via ORAL
  Filled 2021-12-05 (×2): qty 2

## 2021-12-05 NOTE — Plan of Care (Signed)
Started IVIG yesterday. Continue total of 5 doses of IVIG. Reports some subjective improvement in numbness. We will continue to follow with you. I have reached out to neuromuscular specialist Dr. Posey Pronto to refer her for outpatient EMG nerve conduction studies.  Please make a referral at discharge to Dr. Posey Pronto at [facility] neurology.  Neurology to follow -- Amie Portland, MD Neurologist [facility] Pager: [phone]

## 2021-12-05 NOTE — Discharge Instructions (Addendum)
Follow with Primary MD [employer], MD in 7 days also follow-up with recommended GI and neurologist within 1 to 2 weeks of discharge  Get CBC, CMP, Magnesium, TSH, Vitamin B1, B6, J50, D, folic acid levels-  checked next visit within 1 week by Primary MD    Activity: As tolerated with Full fall precautions use walker/cane & assistance as needed  Disposition Home    Diet: Heart Healthy   Special Instructions: If you have smoked or chewed Tobacco  in the last 2 yrs please stop smoking, stop any regular Alcohol  and or any Recreational drug use.  On your next visit with your primary care physician please Get Medicines reviewed and adjusted.  Please request your Prim.MD to go over all Hospital Tests and Procedure/Radiological results at the follow up, please get all Hospital records sent to your Prim MD by signing hospital release before you go home.  If you experience worsening of your admission symptoms, develop shortness of breath, life threatening emergency, suicidal or homicidal thoughts you must seek medical attention immediately by calling 911 or calling your MD immediately  if symptoms less severe.  You Must read complete instructions/literature along with all the possible adverse reactions/side effects for all the Medicines you take and that have been prescribed to you. Take any new Medicines after you have completely understood and accpet all the possible adverse reactions/side effects.

## 2021-12-05 NOTE — Progress Notes (Signed)
PROGRESS NOTE        PATIENT DETAILS Name: Kimberly [name] Age: 36 y.o. Sex: female Date of Birth: 10/07/1986 Admit Date: 12/02/2021 Admitting Physician Marcelyn Bruins, MD [id number], Purcell Nails, MD  Brief Summary: 36 year old with history of GBS January 2020 (Tx w IVIG)-subsequently hospitalized a few days later when her thiamine levels came back severely low (Tx w high dose IV Thiamine)-subsequently left with some chronic residual paresthesias-presented to the ED on 12/02/2021 with rapidly progressive paresthesias  Significant events: 1/26>> admit to [facility] for evaluation of rapidly progressive paresthesia.  Significant imaging studies: 1/27>> C-spine/T-spine/L-spine MRI: No cord lesions-no root enhancements.  Procedures: 1/26>> lumbar puncture by ED MD.  Significant studies: 1/26>>TSH: 2.54 (normal) 1/26>> CSF protein 18, WBC 1 1/26>> vitamin B1: Pending 1/27>> RPR/B3/B6/copper: Pending 1/27>> folate: 3 (low) 1/27>> vitamin B12: 437  Microbiology data: 1/27>> COVID/flu PCR: Negative 1/27>> HIV: Nonreactive 1/26>> CSF culture: Pending   Subjective:  Patient in bed, appears comfortable, denies any headache, no fever, no chest pain or pressure, no shortness of breath , no abdominal pain. No new focal weakness, bilat leg tingling.   Objective: Vitals: Blood pressure 93/60, pulse 100, temperature 98.5 F (36.9 C), temperature source Oral, resp. rate 19, height 5\' 7"  (1.702 m), weight 59.3 kg, SpO2 96 %.   Exam:  Awake Alert, No new F.N deficits, Normal affect [place].AT,PERRAL Supple Neck, No JVD,   Symmetrical Chest wall movement, Good air movement bilaterally, CTAB RRR,No Gallops, Rubs or new Murmurs,  +ve B.Sounds, Abd Soft, No tenderness,   No Cyanosis, Clubbing or edema     Pertinent Labs/Radiology: CBC Latest Ref Rng & Units 12/05/2021 12/04/2021 12/03/2021  WBC 4.0 - 10.5 K/uL 4.4 6.5 7.3  Hemoglobin 12.0 - 15.0 g/dL 11.0(L) 11.2(L)  13.5  Hematocrit 36.0 - 46.0 % 34.3(L) 33.7(L) 39.0  Platelets 150 - 400 K/uL 195 224 278    Lab Results  Component Value Date   NA 139 12/05/2021   K 3.3 (L) 12/05/2021   CL 109 12/05/2021   CO2 18 (L) 12/05/2021      Assessment/Plan: * Rapidly progressive ascending paresthesias Rapidly progressive bilateral lower extremity ascending paresthesias with known past medical history of beriberi and Guillain-Barr syndrome.  Currently being seen by neurology, on IV thiamine and folic acid supplementation, monitor pending micronutrient levels, case discussed with neurology she is getting 5-day trial of IVIG starting on 12/05/2021.  She will have to follow outpatient with neurology posttreatment along with GI, I think most of her issues are arising from severe micronutrient dietary deficiency, she seems to have chronic diarrhea for several years and also has significant history of alcohol abuse in the past with known thiamine and folic acid deficiency with B12 being borderline.     History of Guillain-Barre syndrome January 2020 CSF studies/MRI spine not consistent with GBS-neurology following-we will defer initiation of IVIG to the neurology.  History of dry beriberi January 2020- (present on admission) On IV thiamine empirically-was also deficient in folic acid levels, N23 levels were borderline.  All being supplemented.  She has ongoing diarrhea for several years with history of alcohol abuse in the past.  Needs close outpatient GI follow-up for possible malabsorption and multiple micronutrien/vitamin deficiencies.  She follows with Dr. Simona Huh with [facility].  Folic acid deficiency- (present on admission) Continue to supplement.  Unclear if this is the  cause of her presenting paresthesias.  Polyneuropathy Per patient-she has some residual neuropathy from her prior episode of GBS-remains on Neurontin.  GERD (gastroesophageal reflux disease)- (present on admission) Continue PPI  cautiously-see above  Anxiety/depression- (present on admission) Continue Xanax/Lexapro.  Hypokalemia/hypomagnesemia- (present on admission) Repleted.?  Discontinue Protonix and switch to Pepcid, has intermittent diarrhea nausea vomiting at baseline under the care of Dr. Loletha Carrow.  Will monitor for now with supportive care.  Replace electrolytes.   BMI: Estimated body mass index is 20.48 kg/m as calculated from the following:   Height as of this encounter: 5\' 7"  (1.702 m).   Weight as of this encounter: 59.3 kg.    DVT Prophylaxis: SQ Lovenox Procedures: None Consults: Neurology Code Status:Full code Family Communication: None at bedside   Disposition Plan: Status is: Inpatient  Remains inpatient appropriate because: Ascending paresthesias with weakness-work-up in progress-not yet stable for discharge.   Diet: Diet Order             Diet regular Room service appropriate? Yes; Fluid consistency: Thin  Diet effective now                     Antimicrobial agents: Anti-infectives (From admission, onward)    Start     Dose/Rate Route Frequency Ordered Stop   12/03/21 1000  nitrofurantoin (macrocrystal-monohydrate) (MACROBID) capsule 100 mg  Status:  Discontinued        100 mg Oral 2 times daily 12/03/21 0000 12/03/21 1008        MEDICATIONS: Scheduled Meds:  ALPRAZolam  0.5 mg Oral TID   aspirin EC  81 mg Oral Daily   cyanocobalamin  1,000 mcg Subcutaneous Q30 days   enoxaparin (LOVENOX) injection  40 mg Subcutaneous Daily   escitalopram  10 mg Oral Daily   famotidine  20 mg Oral BID   feeding supplement  237 mL Oral BID BM   folic acid  2 mg Intravenous Daily   gabapentin  400 mg Oral TID   potassium chloride  40 mEq Oral BID   sodium chloride flush  3 mL Intravenous Q12H   [START ON 12/11/2021] thiamine injection  100 mg Intravenous Daily   Continuous Infusions:  Immune Globulin 10% 25 g (12/04/21 2022)   thiamine injection     PRN Meds:.acetaminophen  **OR** acetaminophen, fentaNYL (SUBLIMAZE) injection, HYDROcodone-acetaminophen, naLOXone (NARCAN)  injection   I have personally reviewed following labs and imaging studies  LABORATORY DATA: CBC: Recent Labs  Lab 12/02/21 1652 12/02/21 1911 12/03/21 0517 12/04/21 0044 12/05/21 0218  WBC 10.3  --  7.3 6.5 4.4  NEUTROABS 6.5  --   --   --  2.3  HGB 15.3* 16.0* 13.5 11.2* 11.0*  HCT 44.0 47.0* 39.0 33.7* 34.3*  MCV 101.9*  --  101.3* 104.7* 108.5*  PLT 309  --  278 224 629    Basic Metabolic Panel: Recent Labs  Lab 12/02/21 1911 12/02/21 1937 12/03/21 0036 12/03/21 0517 12/04/21 0044 12/05/21 0218  NA 136  --  135 134* 139 139  K 2.8*  --  2.6* 3.9 3.4* 3.3*  CL 101  --  98 101 113* 109  CO2  --   --  22 23 17* 18*  GLUCOSE 96  --  122* 87 60* 79  BUN <3*  --  <5* <5* <5* <5*  CREATININE 0.40*  --  0.79 0.63 0.48 0.53  CALCIUM  --   --  8.8* 8.5* 7.8* 7.9*  MG  --  1.6*  --  2.2 1.9 1.5*    GFR: Estimated Creatinine Clearance: 90.1 mL/min (by C-G formula based on SCr of 0.53 mg/dL).  Liver Function Tests: Recent Labs  Lab 12/03/21 0517 12/05/21 0218  AST 81* 59*  ALT 34 30  ALKPHOS 124 94  BILITOT 2.7* 0.9  PROT 5.8* 5.5*  ALBUMIN 3.0* 2.4*   No results for input(s): LIPASE, AMYLASE in the last 168 hours. No results for input(s): AMMONIA in the last 168 hours.  Coagulation Profile: No results for input(s): INR, PROTIME in the last 168 hours.  Cardiac Enzymes: No results for input(s): CKTOTAL, CKMB, CKMBINDEX, TROPONINI in the last 168 hours.  BNP (last 3 results) No results for input(s): PROBNP in the last 8760 hours.  Lipid Profile: No results for input(s): CHOL, HDL, LDLCALC, TRIG, CHOLHDL, LDLDIRECT in the last 72 hours.  Thyroid Function Tests: Recent Labs    12/02/21 1652  TSH 2.548    Anemia Panel: Recent Labs    12/03/21 0036  VITAMINB12 437  FOLATE 3.0*    Urine analysis:    Component Value Date/Time   COLORURINE BROWN  (A) 10/24/2019 1211   APPEARANCEUR CLOUDY (A) 10/24/2019 1211   LABSPEC 1.025 10/24/2019 1211   PHURINE 6.0 10/24/2019 1211   GLUCOSEU NEGATIVE 10/24/2019 1211   HGBUR 2+ (A) 10/24/2019 1211   BILIRUBINUR NEGATIVE 12/06/2018 1629   KETONESUR NEGATIVE 10/24/2019 1211   PROTEINUR 1+ (A) 10/24/2019 1211   UROBILINOGEN 0.2 06/03/2014 1311   NITRITE NEGATIVE 12/06/2018 1629   LEUKOCYTESUR NEGATIVE 12/06/2018 1629    Sepsis Labs: Lactic Acid, Venous    Component Value Date/Time   LATICACIDVEN 2.85 (HH) 11/16/2018 1118    RADIOLOGY STUDIES/RESULTS: No results found.   LOS: 2 days   Signature  Lala Lund M.D on 12/05/2021 at 10:47 AM   -  To page go to [email or web address]

## 2021-12-06 DIAGNOSIS — R531 Weakness: Secondary | ICD-10-CM | POA: Diagnosis not present

## 2021-12-06 LAB — CBC WITH DIFFERENTIAL/PLATELET
Abs Immature Granulocytes: 0.02 10*3/uL (ref 0.00–0.07)
Basophils Absolute: 0 10*3/uL (ref 0.0–0.1)
Basophils Relative: 1 %
Eosinophils Absolute: 0.2 10*3/uL (ref 0.0–0.5)
Eosinophils Relative: 5 %
HCT: 34.4 % — ABNORMAL LOW (ref 36.0–46.0)
Hemoglobin: 11.2 g/dL — ABNORMAL LOW (ref 12.0–15.0)
Immature Granulocytes: 1 %
Lymphocytes Relative: 53 %
Lymphs Abs: 2.4 10*3/uL (ref 0.7–4.0)
MCH: 35.2 pg — ABNORMAL HIGH (ref 26.0–34.0)
MCHC: 32.6 g/dL (ref 30.0–36.0)
MCV: 108.2 fL — ABNORMAL HIGH (ref 80.0–100.0)
Monocytes Absolute: 0.4 10*3/uL (ref 0.1–1.0)
Monocytes Relative: 9 %
Neutro Abs: 1.4 10*3/uL — ABNORMAL LOW (ref 1.7–7.7)
Neutrophils Relative %: 31 %
Platelets: 184 10*3/uL (ref 150–400)
RBC: 3.18 MIL/uL — ABNORMAL LOW (ref 3.87–5.11)
RDW: 13 % (ref 11.5–15.5)
WBC: 4.4 10*3/uL (ref 4.0–10.5)
nRBC: 0 % (ref 0.0–0.2)

## 2021-12-06 LAB — CSF CULTURE W GRAM STAIN
Culture: NO GROWTH
Gram Stain: NONE SEEN
Special Requests: NORMAL

## 2021-12-06 LAB — COMPREHENSIVE METABOLIC PANEL
ALT: 34 U/L (ref 0–44)
AST: 79 U/L — ABNORMAL HIGH (ref 15–41)
Albumin: 2.6 g/dL — ABNORMAL LOW (ref 3.5–5.0)
Alkaline Phosphatase: 120 U/L (ref 38–126)
Anion gap: 6 (ref 5–15)
BUN: <5 mg/dL — ABNORMAL LOW (ref 6–20)
CO2: 23 mmol/L (ref 22–32)
Calcium: 7.7 mg/dL — ABNORMAL LOW (ref 8.9–10.3)
Chloride: 106 mmol/L (ref 98–111)
Creatinine, Ser: 0.48 mg/dL (ref 0.44–1.00)
GFR, Estimated: >60 mL/min (ref >60)
Glucose, Bld: 85 mg/dL (ref 70–99)
Potassium: 4.6 mmol/L (ref 3.5–5.1)
Sodium: 135 mmol/L (ref 135–145)
Total Bilirubin: 0.9 mg/dL (ref 0.3–1.2)
Total Protein: 6.6 g/dL (ref 6.5–8.1)

## 2021-12-06 LAB — MAGNESIUM: Magnesium: 2.1 mg/dL (ref 1.7–2.4)

## 2021-12-06 LAB — BRAIN NATRIURETIC PEPTIDE: B Natriuretic Peptide: 97.9 pg/mL (ref 0.0–100.0)

## 2021-12-06 LAB — VITAMIN B6: Vitamin B6: 1.7 ug/L — ABNORMAL LOW (ref 3.4–65.2)

## 2021-12-06 MED ORDER — OYSTER SHELL CALCIUM/D3 500-5 MG-MCG PO TABS: 1.0000 | ORAL_TABLET | Freq: Two times a day (BID) | ORAL | Status: DC

## 2021-12-06 MED ORDER — ONDANSETRON HCL 4 MG/2ML IJ SOLN
4.0000 mg | Freq: Three times a day (TID) | INTRAMUSCULAR | Status: DC | PRN
  Administered 2021-12-07: 4 mg via INTRAVENOUS
  Filled 2021-12-06 (×3): qty 2

## 2021-12-06 MED ORDER — ADULT MULTIVITAMIN W/MINERALS CH
1.0000 | ORAL_TABLET | Freq: Every day | ORAL | Status: DC
  Administered 2021-12-06 – 2021-12-09 (×4): 1 via ORAL
  Filled 2021-12-06 (×4): qty 1

## 2021-12-06 MED ORDER — GABAPENTIN 300 MG PO CAPS
600.0000 mg | ORAL_CAPSULE | Freq: Three times a day (TID) | ORAL | Status: DC
  Administered 2021-12-06 – 2021-12-09 (×9): 600 mg via ORAL
  Filled 2021-12-06 (×9): qty 2

## 2021-12-06 MED ORDER — VITAMIN D 25 MCG (1000 UNIT) PO TABS
1000.0000 [IU] | ORAL_TABLET | Freq: Every day | ORAL | Status: DC
Start: 2021-12-06 — End: 2021-12-09
  Administered 2021-12-06 – 2021-12-09 (×4): 1000 [IU] via ORAL
  Filled 2021-12-06 (×4): qty 1

## 2021-12-06 NOTE — Progress Notes (Addendum)
PROGRESS NOTE        PATIENT DETAILS Name: Kimberly [name] Age: 36 y.o. Sex: female Date of Birth: March 01, 1986 Admit Date: 12/02/2021 Admitting Physician Marcelyn Bruins, MD [id number], Purcell Nails, MD  Brief Summary: 36 year old with history of GBS January 2020 (Tx w IVIG)-subsequently hospitalized a few days later when her thiamine levels came back severely low (Tx w high dose IV Thiamine)-subsequently left with some chronic residual paresthesias-presented to the ED on 12/02/2021 with rapidly progressive paresthesias  Significant events: 1/26>> admit to [facility] for evaluation of rapidly progressive paresthesia.  Significant imaging studies: 1/27>> C-spine/T-spine/L-spine MRI: No cord lesions-no root enhancements.  Procedures: 1/26>> lumbar puncture by ED MD.  Significant studies: 1/26>>TSH: 2.54 (normal) 1/26>> CSF protein 18, WBC 1 1/26>> vitamin B1: Pending 1/27>> RPR/B3/B6/copper: Pending 1/27>> folate: 3 (low) 1/27>> vitamin B12: 437  Microbiology data: 1/27>> COVID/flu PCR: Negative 1/27>> HIV: Nonreactive 1/26>> CSF culture: Pending   Subjective:  Patient in bed, appears comfortable, denies any headache, no fever, no chest pain or pressure, no shortness of breath , no abdominal pain. No new focal weakness, bilat leg tingling.   Objective: Vitals: Blood pressure 106/77, pulse 81, temperature 98 F (36.7 C), temperature source Oral, resp. rate 20, height 5\' 7"  (1.702 m), weight 59.3 kg, SpO2 100 %.   Exam:  Awake Alert, No new F.N deficits, Normal affect [place].AT,PERRAL Supple Neck, No JVD,   Symmetrical Chest wall movement, Good air movement bilaterally, CTAB RRR,No Gallops, Rubs or new Murmurs,  +ve B.Sounds, Abd Soft, No tenderness,   No Cyanosis, Clubbing or edema      Assessment/Plan: * Rapidly progressive ascending paresthesias Rapidly progressive bilateral lower extremity ascending paresthesias with known past medical history  of beriberi and Guillain-Barr syndrome.  Currently being seen by neurology, on IV thiamine and folic acid supplementation, monitor pending B^ and some other micronutrient levels, case discussed with neurology she is getting 5-day trial of IVIG starting on 12/05/2021.  She will have to follow outpatient with neurology posttreatment along with GI, I think most of her issues are arising from severe micronutrient dietary deficiency, she seems to have chronic diarrhea for several years and also has significant history of alcohol abuse in the past with known thiamine and folic acid deficiency with B12 being borderline.     History of Guillain-Barre syndrome January 2020 CSF studies/MRI spine not consistent with GBS-neurology following-we will defer initiation of IVIG to the neurology.  History of dry beriberi January 2020- (present on admission) On IV thiamine empirically-was also deficient in folic acid levels, S17 levels were borderline.  All being supplemented.  She has ongoing diarrhea for several years with history of alcohol abuse in the past.  Needs close outpatient GI follow-up for possible malabsorption and multiple micronutrien/vitamin deficiencies.  She follows with Dr. Simona Huh with [facility].  Folic acid deficiency- (present on admission) Continue to supplement.  Unclear if this is the cause of her presenting paresthesias.  Polyneuropathy Per patient-she has some residual neuropathy from her prior episode of GBS-remains on Neurontin.  GERD (gastroesophageal reflux disease)- (present on admission) Continue PPI cautiously-see above  Anxiety/depression- (present on admission) Continue Xanax/Lexapro.  Hypokalemia/hypomagnesemia- (present on admission) Repleted.?  Discontinue Protonix and switch to Pepcid, has intermittent diarrhea nausea vomiting at baseline under the care of Dr. Loletha Carrow.  Will monitor for now with supportive care.  Replace electrolytes.  BMI: Estimated body mass index  is 20.48 kg/m as calculated from the following:   Height as of this encounter: 5\' 7"  (1.702 m).   Weight as of this encounter: 59.3 kg.    DVT Prophylaxis: SQ Lovenox Procedures: None Consults: Neurology Code Status:Full code Family Communication: None at bedside   Disposition Plan: Status is: Inpatient  Remains inpatient appropriate because: Ascending paresthesias with weakness-work-up in progress-not yet stable for discharge.   Diet: Diet Order             Diet regular Room service appropriate? Yes; Fluid consistency: Thin  Diet effective now                     Antimicrobial agents: Anti-infectives (From admission, onward)    Start     Dose/Rate Route Frequency Ordered Stop   12/03/21 1000  nitrofurantoin (macrocrystal-monohydrate) (MACROBID) capsule 100 mg  Status:  Discontinued        100 mg Oral 2 times daily 12/03/21 0000 12/03/21 1008        MEDICATIONS: Scheduled Meds:  ALPRAZolam  0.5 mg Oral TID   aspirin EC  81 mg Oral Daily   cholecalciferol  1,000 Units Oral Daily   cyanocobalamin  1,000 mcg Subcutaneous Q30 days   enoxaparin (LOVENOX) injection  40 mg Subcutaneous Daily   escitalopram  10 mg Oral Daily   famotidine  20 mg Oral BID   feeding supplement  237 mL Oral BID BM   folic acid  2 mg Intravenous Daily   gabapentin  400 mg Oral TID   sodium chloride flush  3 mL Intravenous Q12H   [START ON 12/11/2021] thiamine injection  100 mg Intravenous Daily   Continuous Infusions:  Immune Globulin 10% 25 g (12/04/21 2022)   thiamine injection 250 mg (12/06/21 0827)   PRN Meds:.acetaminophen **OR** acetaminophen, fentaNYL (SUBLIMAZE) injection, HYDROcodone-acetaminophen, naLOXone (NARCAN)  injection   I have personally reviewed following labs and imaging studies  LABORATORY DATA:  Recent Labs  Lab 12/02/21 1652 12/02/21 1911 12/03/21 0517 12/04/21 0044 12/05/21 0218 12/06/21 0021  WBC 10.3  --  7.3 6.5 4.4 4.4  HGB 15.3* 16.0* 13.5  11.2* 11.0* 11.2*  HCT 44.0 47.0* 39.0 33.7* 34.3* 34.4*  PLT 309  --  278 224 195 184  MCV 101.9*  --  101.3* 104.7* 108.5* 108.2*  MCH 35.4*  --  35.1* 34.8* 34.8* 35.2*  MCHC 34.8  --  34.6 33.2 32.1 32.6  RDW 12.9  --  13.0 12.9 12.9 13.0  LYMPHSABS 2.7  --   --   --  1.6 2.4  MONOABS 0.7  --   --   --  0.3 0.4  EOSABS 0.3  --   --   --  0.1 0.2  BASOSABS 0.1  --   --   --  0.0 0.0    Recent Labs  Lab 12/02/21 1652 12/02/21 1911 12/02/21 1937 12/03/21 0036 12/03/21 0517 12/04/21 0044 12/05/21 0218 12/06/21 0021  NA  --    < >  --  135 134* 139 139 135  K  --    < >  --  2.6* 3.9 3.4* 3.3* 4.6  CL  --    < >  --  98 101 113* 109 106  CO2  --   --   --  22 23 17* 18* 23  GLUCOSE  --    < >  --  122* 87 60* 79 85  BUN  --    < >  --  <5* <5* <5* <5* <5*  CREATININE  --    < >  --  0.79 0.63 0.48 0.53 0.48  CALCIUM  --   --   --  8.8* 8.5* 7.8* 7.9* 7.7*  AST  --   --   --   --  81*  --  59* 79*  ALT  --   --   --   --  34  --  30 34  ALKPHOS  --   --   --   --  124  --  94 120  BILITOT  --   --   --   --  2.7*  --  0.9 0.9  ALBUMIN  --   --   --   --  3.0*  --  2.4* 2.6*  MG  --   --  1.6*  --  2.2 1.9 1.5* 2.1  TSH 2.548  --   --   --   --   --   --   --   BNP  --   --   --   --   --   --  150.5* 97.9   < > = values in this interval not displayed.         RADIOLOGY STUDIES/RESULTS: No results found.   LOS: 3 days   Signature  Lala Lund M.D on 12/06/2021 at 10:24 AM   -  To page go to [email or web address]

## 2021-12-06 NOTE — Progress Notes (Signed)
Neurology Progress Note  Brief HPI: Neurology consulted on 12/03/21 for ascending paraesthesias from feet to abdomen and center of chest. She states that she has had residual numbness to the bottom of her feet and her fingertips since GBS in 2020 and takes Gabapentin. She has a history of Vitamin B1 deficiency and recent weight loss. Her spinal images was negative for cord compression. She was started on IVIG and Thiamine repletion. She has had 2 IVIG treatments thus far. Today is #3/5.   S: She states she is feeling better. The parasthesias in her UEs are better with only residual in fingertips which is same as admission. Her LE allodynia is improved as well as parasthesias. Her RLE is worse than her LLE. She notices tingling to LEs when touched, worse to bottom of feet and posterior calf. No urinary or stool incontinence. The vaginal numbness she had has resolved. She has been up with PT and states she will be able to walk in hallway today.   O: Current vital signs: BP 106/77 (BP Location: Left Arm)    Pulse 81    Temp 98 F (36.7 C) (Oral)    Resp 20    Ht 5\' 7"  (1.702 m)    Wt 59.3 kg    SpO2 100%    BMI 20.48 kg/m  Vital signs in last 24 hours: Temp:  [97.7 F (36.5 C)-98.7 F (37.1 C)] 98 F (36.7 C) (01/30 0800) Pulse Rate:  [70-98] 81 (01/30 0800) Resp:  [12-33] 20 (01/30 0800) BP: (87-119)/(61-87) 106/77 (01/30 0800) SpO2:  [96 %-100 %] 100 % (01/30 0800)  GENERAL: Very well appearing female sitting up in bed [place] style.  Awake, alert in NAD. HEENT: Normocephalic and atraumatic. LUNGS: Normal respiratory effort.  CV: RRR on tele.  Ext: warm.  NEURO:  Mental Status: Alert and oriented x 4. Follows commands.  Speech/Language: speech is without aphasia or dysarthria.  Naming, repetition, fluency, and comprehension intact.  Cranial Nerves:  II: Conservation officer, nature. Visual fields full.  III, IV, VI: EOMI. Eyelids elevate symmetrically.  V: Sensation is intact to light touch and  symmetrical to face.  VII: Smile is symmetrical.  VIII: hearing intact to voice. IX, X: Palate elevates symmetrically. Phonation is normal.  QT:MAUQJFHL shrug 5/5. XII: tongue is midline without fasciculations. Motor:  BUE: grips 4+/5. Biceps 5/5. Triceps 5/5.  BLE: thigh 5/5.  Knee 5/5. Plantar and dorsiflexion 5/5.  Tone: is normal and bulk is normal. Sensation- Intact to light touch bilaterally. Extinction absent to DSS. Light touch sensation is intact but decreased to right tibia. Increased tingling when bottom of feet touched, R>L. Sensation intact to bilateral thighs, R<L.    Increased pain with touching noted to bilateral feet, R>L.  Coordination: FTN intact bilaterally, HKS: no ataxia in BLE. No drift. DTRs: 1+ UEs.  0  LEs.    Medications  Current Facility-Administered Medications:    acetaminophen (TYLENOL) tablet 650 mg, 650 mg, Oral, Q6H PRN **OR** acetaminophen (TYLENOL) suppository 650 mg, 650 mg, Rectal, Q6H PRN, Marcelyn Bruins, MD   ALPRAZolam Duanne Moron) tablet 0.5 mg, 0.5 mg, Oral, TID, Marcelyn Bruins, MD, 0.5 mg at 12/06/21 4562   aspirin EC tablet 81 mg, 81 mg, Oral, Daily, Amie Portland, MD, 81 mg at 12/06/21 5638   cholecalciferol (VITAMIN D3) tablet 1,000 Units, 1,000 Units, Oral, Daily, Kirby-Graham, Karsten Fells, NP   cyanocobalamin ((VITAMIN B-12)) injection 1,000 mcg, 1,000 mcg, Subcutaneous, Q30 days, Thurnell Lose, MD, 1,000 mcg at 12/04/21  1006   enoxaparin (LOVENOX) injection 40 mg, 40 mg, Subcutaneous, Daily, Marcelyn Bruins, MD, 40 mg at 12/06/21 8676   escitalopram (LEXAPRO) tablet 10 mg, 10 mg, Oral, Daily, Marcelyn Bruins, MD, 10 mg at 12/06/21 7209   famotidine (PEPCID) tablet 20 mg, 20 mg, Oral, BID, Thurnell Lose, MD, 20 mg at 12/06/21 4709   feeding supplement (ENSURE ENLIVE / ENSURE PLUS) liquid 237 mL, 237 mL, Oral, BID BM, Ghimire, Henreitta Leber, MD, 237 mL at 12/06/21 6283   fentaNYL (SUBLIMAZE) injection 25 mcg, 25 mcg, Intravenous,  Q6H PRN, Jonetta Osgood, MD, 25 mcg at 66/29/47 6546   folic acid injection 2 mg, 2 mg, Intravenous, Daily, Ghimire, Henreitta Leber, MD, 2 mg at 12/06/21 0836   gabapentin (NEURONTIN) capsule 400 mg, 400 mg, Oral, TID, Ghimire, Henreitta Leber, MD, 400 mg at 12/06/21 5035   HYDROcodone-acetaminophen (NORCO/VICODIN) 5-325 MG per tablet 1-2 tablet, 1-2 tablet, Oral, Q6H PRN, Jonetta Osgood, MD, 2 tablet at 12/06/21 0540   Immune Globulin 10% (PRIVIGEN) IV infusion 25 g, 400 mg/kg, Intravenous, Q24 Hr x 5, Amie Portland, MD, Last Rate: 17.8 mL/hr at 12/04/21 2022, 25 g at 12/05/21 1954   naloxone (NARCAN) injection 0.4 mg, 0.4 mg, Intravenous, PRN, Ghimire, Henreitta Leber, MD   sodium chloride flush (NS) 0.9 % injection 3 mL, 3 mL, Intravenous, Q12H, Marcelyn Bruins, MD, 3 mL at 12/06/21 0836   [COMPLETED] thiamine 500mg  in normal saline (63ml) IVPB, 500 mg, Intravenous, Q8H, Last Rate: 100 mL/hr at 12/04/21 2228, 500 mg at 12/04/21 2228 **FOLLOWED BY** thiamine (B-1) 250 mg in sodium chloride 0.9 % 50 mL IVPB, 250 mg, Intravenous, Daily, Last Rate: 100 mL/hr at 12/06/21 0827, 250 mg at 12/06/21 0827 **FOLLOWED BY** [START ON 12/11/2021] thiamine (B-1) injection 100 mg, 100 mg, Intravenous, Daily, Greta Doom, MD  Assessment: 36 yo female with a PMHx of GBS in 2020, now presenting with ascending paraesthesias. Spinal imaging negative for etiology. Dry BeriBeri is still in differential as her thiamine deficiency is still there after having BeriBeri in th past. IVIG seems to be working and she remains areflexic in LEs, so GBS is also still a consideration. It is unclear at this time what her #1 diagnosis is. NP believes her past diagnosis of poly neuropathy could be influencing her feet exam and allodynia. We will increase her Neurontin and monitor that.   Recommendations/Plan: -Finish IVIG sessions. Today is 3/5.  -Continue to monitor response to IVIG.  -Continue Gabapentin but we will raise dose.     Pt seen by Clance Boll, MSN, APN-BC/Nurse Practitioner/Neuro and later by MD. Note and plan to be edited as needed by MD.  Pager: [phone]

## 2021-12-06 NOTE — Progress Notes (Signed)
Initial Nutrition Assessment  DOCUMENTATION CODES:   Non-severe (moderate) malnutrition in context of social or environmental circumstances  INTERVENTION:   Continue Ensure Enlive po BID, each supplement provides 350 kcal and 20 grams of protein Multivitamin w/ minerals daily Encourage good PO intake  NUTRITION DIAGNOSIS:   Moderate Malnutrition related to social / environmental circumstances as evidenced by energy intake < 75% for > or equal to 3 months, moderate muscle depletion, percent weight loss.  GOAL:   Patient will meet greater than or equal to 90% of their needs  MONITOR:   PO intake, Supplement acceptance, Labs, Weight trends  REASON FOR ASSESSMENT:   Malnutrition Screening Tool    ASSESSMENT:   36 y.o. female presented to the ED with increased numbness in her lower extremities. PMH includes IBS, GERD, Guillain-Barre, Dry BeriBeri, and polysub abuse. Pt admitted for work-up for Guillain- Barre and dry BeriBeri due to history of both.   1/28 - started on IVIG per Neurology  Pt reports that she has had a very poor to nonexistent appetite for over a year. Pt reports that she has to force herself to eat at every meal. Reports that she typically will only eat a few bites at each meal time. Pt reports that she typically eats fruits, vegetables, and meat on occasion. Pt reports that when she gets an appetite or craves something by time she finishes cooking it she no longer wants it or lost her appetite. Reports that she just started drinking 1 Ensure per day this past week. Pt endorses nausea and vomiting.   Pt reports that she use to weight 200# and intentionally lost down to 150#. Then within the past 6 months she has unintentionally lost 25# since the passing of her mother. No weights within the past year recorded within EMR. Pt currently weighs 130#. Per pt report of previous weight, this would be a 13% weight loss within 6 months, which is clinically significant for time  frame.  Discussed continue ONS while here; pt agreeable. Discussed with pt that they are great to continue at home especially if not eating much. Provided pt with a few tips to increase PO intake with poor appetite. Recommend 6 small meals per day, having ready to eat/quick foods on hand to limit cook time.   Medications reviewed and include: Vitamin D3, Vitamin Z66, Pepcid, Folic Acid, Thiamine Labs reviewed:  - BUN <5 - Thiamine 74.8 - Vitamin B12 437   NUTRITION - FOCUSED PHYSICAL EXAM:  Flowsheet Row Most Recent Value  Orbital Region No depletion  Upper Arm Region No depletion  Thoracic and Lumbar Region No depletion  Buccal Region No depletion  Temple Region No depletion  Clavicle Bone Region Mild depletion  Clavicle and Acromion Bone Region Mild depletion  Scapular Bone Region Mild depletion  Dorsal Hand No depletion  Patellar Region Moderate depletion  Anterior Thigh Region Moderate depletion  Posterior Calf Region Moderate depletion  Edema (RD Assessment) None  Hair Reviewed  Eyes Reviewed  Mouth Reviewed  Skin Reviewed  Nails Reviewed       Diet Order:   Diet Order             Diet regular Room service appropriate? Yes; Fluid consistency: Thin  Diet effective now                   EDUCATION NEEDS:   No education needs have been identified at this time  Skin:  Skin Assessment: Reviewed RN Assessment  Last  BM:  1/29  Height:   Ht Readings from Last 1 Encounters:  12/03/21 5\' 7"  (1.702 m)    Weight:   Wt Readings from Last 1 Encounters:  12/03/21 59.3 kg    Ideal Body Weight:  61.4 kg  BMI:  Body mass index is 20.48 kg/m.  Estimated Nutritional Needs:   Kcal:  1700-1900  Protein:  85-100 grams  Fluid:  >/= 1.7 L    Cashe Gatt Louie Casa, RD, LDN Clinical Dietitian See [facility] for contact information.

## 2021-12-07 DIAGNOSIS — R531 Weakness: Secondary | ICD-10-CM | POA: Diagnosis not present

## 2021-12-07 LAB — CBC WITH DIFFERENTIAL/PLATELET
Abs Immature Granulocytes: 0.01 10*3/uL (ref 0.00–0.07)
Basophils Absolute: 0 10*3/uL (ref 0.0–0.1)
Basophils Relative: 1 %
Eosinophils Absolute: 0.2 10*3/uL (ref 0.0–0.5)
Eosinophils Relative: 5 %
HCT: 34 % — ABNORMAL LOW (ref 36.0–46.0)
Hemoglobin: 10.9 g/dL — ABNORMAL LOW (ref 12.0–15.0)
Immature Granulocytes: 0 %
Lymphocytes Relative: 57 %
Lymphs Abs: 2.1 10*3/uL (ref 0.7–4.0)
MCH: 35.2 pg — ABNORMAL HIGH (ref 26.0–34.0)
MCHC: 32.1 g/dL (ref 30.0–36.0)
MCV: 109.7 fL — ABNORMAL HIGH (ref 80.0–100.0)
Monocytes Absolute: 0.2 10*3/uL (ref 0.1–1.0)
Monocytes Relative: 6 %
Neutro Abs: 1.1 10*3/uL — ABNORMAL LOW (ref 1.7–7.7)
Neutrophils Relative %: 31 %
Platelets: 200 10*3/uL (ref 150–400)
RBC: 3.1 MIL/uL — ABNORMAL LOW (ref 3.87–5.11)
RDW: 12.9 % (ref 11.5–15.5)
WBC: 3.7 10*3/uL — ABNORMAL LOW (ref 4.0–10.5)
nRBC: 0 % (ref 0.0–0.2)

## 2021-12-07 LAB — COMPREHENSIVE METABOLIC PANEL
ALT: 25 U/L (ref 0–44)
AST: 55 U/L — ABNORMAL HIGH (ref 15–41)
Albumin: 2.4 g/dL — ABNORMAL LOW (ref 3.5–5.0)
Alkaline Phosphatase: 104 U/L (ref 38–126)
Anion gap: 11 (ref 5–15)
BUN: <5 mg/dL — ABNORMAL LOW (ref 6–20)
CO2: 19 mmol/L — ABNORMAL LOW (ref 22–32)
Calcium: 7.9 mg/dL — ABNORMAL LOW (ref 8.9–10.3)
Chloride: 107 mmol/L (ref 98–111)
Creatinine, Ser: 0.46 mg/dL (ref 0.44–1.00)
GFR, Estimated: >60 mL/min (ref >60)
Glucose, Bld: 75 mg/dL (ref 70–99)
Potassium: 4.2 mmol/L (ref 3.5–5.1)
Sodium: 137 mmol/L (ref 135–145)
Total Bilirubin: 1 mg/dL (ref 0.3–1.2)
Total Protein: 6.3 g/dL — ABNORMAL LOW (ref 6.5–8.1)

## 2021-12-07 LAB — BRAIN NATRIURETIC PEPTIDE: B Natriuretic Peptide: 153.6 pg/mL — ABNORMAL HIGH (ref 0.0–100.0)

## 2021-12-07 LAB — MAGNESIUM: Magnesium: 1.6 mg/dL — ABNORMAL LOW (ref 1.7–2.4)

## 2021-12-07 MED ORDER — VITAMIN B-6 25 MG PO TABS: 100.0000 mg | ORAL_TABLET | Freq: Every day | ORAL | Status: DC

## 2021-12-07 MED ORDER — HYDROCODONE-ACETAMINOPHEN 5-325 MG PO TABS
1.0000 | ORAL_TABLET | Freq: Four times a day (QID) | ORAL | Status: DC | PRN
  Administered 2021-12-07 – 2021-12-09 (×5): 1 via ORAL
  Filled 2021-12-07 (×5): qty 1

## 2021-12-07 MED ORDER — PYRIDOXINE HCL 100 MG/ML IJ SOLN
100.0000 mg | Freq: Every day | INTRAMUSCULAR | Status: AC
  Administered 2021-12-07 – 2021-12-09 (×3): 100 mg via INTRAVENOUS
  Filled 2021-12-07 (×4): qty 1

## 2021-12-07 MED ORDER — MAGNESIUM SULFATE 4 GM/100ML IV SOLN
4.0000 g | Freq: Once | INTRAVENOUS | Status: AC
  Administered 2021-12-07: 4 g via INTRAVENOUS
  Filled 2021-12-07: qty 100

## 2021-12-07 NOTE — Progress Notes (Signed)
Physical Therapy Treatment and Discharge Patient Details Name: Kimberly [name] MRN: [id number] DOB: Jun 18, 1986 Today's Date: 12/07/2021   History of Present Illness Kimberly [name] is a 36 y.o. female who presented after 2 days of worsening numbness in her lower extremities. Pt has chronic residual LE parethesias from GBS 11/2020. IVIG pending. PMHx: GBS 2020, ADD, headache, HSV-1, tachycardia, dry beriberi    PT Comments    Patient scored 22/24 on Dynamic Gait index and feels her ambulation is back to baseline (post GBS baseline). Goal met and discharged from PT.    Recommendations for follow up therapy are one component of a multi-disciplinary discharge planning process, led by the attending physician.  Recommendations may be updated based on patient status, additional functional criteria and insurance authorization.  Follow Up Recommendations  No PT follow up     Assistance Recommended at Discharge PRN  Patient can return home with the following Assistance with cooking/housework   Equipment Recommendations  None recommended by PT    Recommendations for Other Services       Precautions / Restrictions Precautions Precautions: Fall     Mobility  Bed Mobility Overal bed mobility: Independent                  Transfers Overall transfer level: Independent                      Ambulation/Gait Ambulation/Gait assistance: Independent, Modified independent (Device/Increase time) Gait Distance (Feet): 550 Feet Assistive device: None, IV Pole Gait Pattern/deviations: WFL(Within Functional Limits)   Gait velocity interpretation: >2.62 ft/sec, indicative of community ambulatory   General Gait Details: able to perform head turns, vary speed, avoid obstacles; able to push IV pole and walk without device   Stairs             Wheelchair Mobility    Modified Rankin (Stroke Patients Only)       Balance Overall balance assessment: Modified  Independent, Mild deficits observed, not formally tested                               Standardized Balance Assessment Standardized Balance Assessment : Dynamic Gait Index   Dynamic Gait Index Level Surface: Normal Change in Gait Speed: Normal Gait with Horizontal Head Turns: Normal Gait with Vertical Head Turns: Mild Impairment Gait and Pivot Turn: Normal Step Over Obstacle: Normal Step Around Obstacles: Normal Steps: Mild Impairment Total Score: 22      Cognition Arousal/Alertness: Awake/alert Behavior During Therapy: WFL for tasks assessed/performed Overall Cognitive Status: Within Functional Limits for tasks assessed                                          Exercises      General Comments General comments (skin integrity, edema, etc.): Discussed use of trekking poles vs standard cane, especially for walking outdoors (due to numbness in bil feet). Discussed proper footwear (preferably high tops/hiking boots).      Pertinent Vitals/Pain Pain Assessment Pain Assessment: No/denies pain Breathing: normal Negative Vocalization: none Facial Expression: smiling or inexpressive Body Language: relaxed Consolability: no need to console PAINAD Score: 0    Home Living                          Prior  Function            PT Goals (current goals can now be found in the care plan section) Acute Rehab PT Goals Patient Stated Goal: back to PLOF PT Goal Formulation: With patient Time For Goal Achievement: 12/10/21 Potential to Achieve Goals: Good Progress towards PT goals: Goals met/education completed, patient discharged from PT    Frequency           PT Plan Current plan remains appropriate    Co-evaluation              AM-PAC PT "6 Clicks" Mobility   Outcome Measure  Help needed turning from your back to your side while in a flat bed without using bedrails?: None Help needed moving from lying on your back to  sitting on the side of a flat bed without using bedrails?: None Help needed moving to and from a bed to a chair (including a wheelchair)?: None Help needed standing up from a chair using your arms (e.g., wheelchair or bedside chair)?: None Help needed to walk in hospital room?: None Help needed climbing 3-5 steps with a railing? : None 6 Click Score: 24    End of Session   Activity Tolerance: Patient tolerated treatment well Patient left: in bed;with call bell/phone within reach Nurse Communication: Mobility status PT Visit Diagnosis: Unsteadiness on feet (R26.81);Other abnormalities of gait and mobility (R26.89)   PT Discharge Note  Patient is being discharged from PT services secondary to:  Goals met and no further therapy needs identified.  Please see latest Therapy Progress Note for current level of functioning and progress toward goals.  Progress and discharge plan and discussed with patient/caregiver and they  Agree    Time: 0916-0925 PT Time Calculation (min) (ACUTE ONLY): 9 min  Charges:  $Gait Training: 8-22 mins                      Arby Barrette, PT [place]  Pager [phone] Office [phone]    Rexanne Mano 12/07/2021, 9:34 AM

## 2021-12-07 NOTE — Progress Notes (Signed)
PROGRESS NOTE        PATIENT DETAILS Name: Kimberly [name] Age: 36 y.o. Sex: female Date of Birth: 1986/10/20 Admit Date: 12/02/2021 Admitting Physician Marcelyn Bruins, MD [id number], Purcell Nails, MD  Brief Summary: 36 year old with history of GBS January 2020 (Tx w IVIG)-subsequently hospitalized a few days later when her thiamine levels came back severely low (Tx w high dose IV Thiamine)-subsequently left with some chronic residual paresthesias-presented to the ED on 12/02/2021 with rapidly progressive paresthesias  Significant events: 1/26>> admit to [facility] for evaluation of rapidly progressive paresthesia.  Significant imaging studies: 1/27>> C-spine/T-spine/L-spine MRI: No cord lesions-no root enhancements.  Procedures: 1/26>> lumbar puncture by ED MD.  Significant studies: 1/26>>TSH: 2.54 (normal) 1/26>> CSF protein 18, WBC 1 1/26>> vitamin B1: Pending 1/27>> RPR/B3/B6/copper: Pending 1/27>> folate: 3 (low) 1/27>> vitamin B12: 437  Microbiology data: 1/27>> COVID/flu PCR: Negative 1/27>> HIV: Nonreactive 1/26>> CSF culture: Pending   Subjective:  Patient in bed, appears comfortable, denies any headache, no fever, no chest pain or pressure, no shortness of breath , no abdominal pain. No new focal weakness, bilat leg tingling.   Objective: Vitals: Blood pressure 96/77, pulse 73, temperature 98.5 F (36.9 C), temperature source Oral, resp. rate 18, height 5\' 7"  (1.702 m), weight 59.3 kg, SpO2 99 %.   Exam:  Awake Alert, No new F.N deficits, Normal affect [place].AT,PERRAL Supple Neck, No JVD,   Symmetrical Chest wall movement, Good air movement bilaterally, CTAB RRR,No Gallops, Rubs or new Murmurs,  +ve B.Sounds, Abd Soft, No tenderness,   No Cyanosis, Clubbing or edema     Assessment/Plan: * Rapidly progressive ascending paresthesias Rapidly progressive bilateral lower extremity ascending paresthesias with known past medical history  of beriberi and Guillain-Barr syndrome.  Currently being seen by neurology, on IV thiamine, B6 and folic acid supplementation, case discussed with neurology she is getting 5-day trial of IVIG starting on 12/05/2021. Pending Vit E.  She will have to follow outpatient with neurology posttreatment along with GI, I think most of her issues are arising from severe micronutrient dietary deficiency, she seems to have chronic diarrhea for several years and also has significant history of alcohol abuse in the past with known thiamine and folic acid deficiency with B12 being borderline.  Smoking - counseled to quit.  History of Guillain-Barre syndrome January 2020 CSF studies/MRI spine not consistent with GBS-neurology following-we will defer initiation of IVIG to the neurology.  History of dry beriberi January 2020- (present on admission) On IV thiamine empirically-was also deficient in folic acid levels, Q68 levels were borderline.  All being supplemented.  She has ongoing diarrhea for several years with history of alcohol abuse in the past.  Needs close outpatient GI follow-up for possible malabsorption and multiple micronutrien/vitamin deficiencies.  She follows with Dr. Simona Huh with [facility].  Folic acid deficiency- (present on admission) Continue to supplement.  Unclear if this is the cause of her presenting paresthesias.  Polyneuropathy Per patient-she has some residual neuropathy from her prior episode of GBS-remains on Neurontin.  GERD (gastroesophageal reflux disease)- (present on admission) Continue PPI cautiously-see above  Anxiety/depression- (present on admission) Continue Xanax/Lexapro.  Hypokalemia/hypomagnesemia- (present on admission) Repleted.?  Discontinue Protonix and switch to Pepcid, has intermittent diarrhea nausea vomiting at baseline under the care of Dr. Loletha Carrow.  Will monitor for now with supportive care.  Replace electrolytes.   BMI:  Estimated body mass index is 20.48  kg/m as calculated from the following:   Height as of this encounter: 5\' 7"  (1.702 m).   Weight as of this encounter: 59.3 kg.    DVT Prophylaxis: SQ Lovenox Procedures: None Consults: Neurology Code Status:Full code Family Communication: None at bedside   Disposition Plan: Status is: Inpatient  Remains inpatient appropriate because: Ascending paresthesias with weakness-work-up in progress-not yet stable for discharge.   Diet: Diet Order             Diet regular Room service appropriate? Yes; Fluid consistency: Thin  Diet effective now                     Antimicrobial agents: Anti-infectives (From admission, onward)    Start     Dose/Rate Route Frequency Ordered Stop   12/03/21 1000  nitrofurantoin (macrocrystal-monohydrate) (MACROBID) capsule 100 mg  Status:  Discontinued        100 mg Oral 2 times daily 12/03/21 0000 12/03/21 1008        MEDICATIONS: Scheduled Meds:  ALPRAZolam  0.5 mg Oral TID   aspirin EC  81 mg Oral Daily   cholecalciferol  1,000 Units Oral Daily   cyanocobalamin  1,000 mcg Subcutaneous Q30 days   enoxaparin (LOVENOX) injection  40 mg Subcutaneous Daily   escitalopram  10 mg Oral Daily   famotidine  20 mg Oral BID   feeding supplement  237 mL Oral BID BM   folic acid  2 mg Intravenous Daily   gabapentin  600 mg Oral TID   multivitamin with minerals  1 tablet Oral Daily   pyridOXINE  100 mg Intravenous Daily   sodium chloride flush  3 mL Intravenous Q12H   [START ON 12/11/2021] thiamine injection  100 mg Intravenous Daily   [START ON 12/10/2021] vitamin B-6  100 mg Oral Daily   Continuous Infusions:  Immune Globulin 10% Stopped (12/07/21 0120)   magnesium sulfate bolus IVPB 4 g (12/07/21 0957)   thiamine injection 250 mg (12/07/21 0826)   PRN Meds:.acetaminophen **OR** acetaminophen, fentaNYL (SUBLIMAZE) injection, HYDROcodone-acetaminophen, naLOXone (NARCAN)  injection, ondansetron (ZOFRAN) IV   I have personally reviewed  following labs and imaging studies  LABORATORY DATA:  Recent Labs  Lab 12/02/21 1652 12/02/21 1911 12/03/21 0517 12/04/21 0044 12/05/21 0218 12/06/21 0021 12/07/21 0210  WBC 10.3  --  7.3 6.5 4.4 4.4 3.7*  HGB 15.3*   < > 13.5 11.2* 11.0* 11.2* 10.9*  HCT 44.0   < > 39.0 33.7* 34.3* 34.4* 34.0*  PLT 309  --  278 224 195 184 200  MCV 101.9*  --  101.3* 104.7* 108.5* 108.2* 109.7*  MCH 35.4*  --  35.1* 34.8* 34.8* 35.2* 35.2*  MCHC 34.8  --  34.6 33.2 32.1 32.6 32.1  RDW 12.9  --  13.0 12.9 12.9 13.0 12.9  LYMPHSABS 2.7  --   --   --  1.6 2.4 2.1  MONOABS 0.7  --   --   --  0.3 0.4 0.2  EOSABS 0.3  --   --   --  0.1 0.2 0.2  BASOSABS 0.1  --   --   --  0.0 0.0 0.0   < > = values in this interval not displayed.    Recent Labs  Lab 12/02/21 1652 12/02/21 1911 12/03/21 0517 12/04/21 0044 12/05/21 0218 12/06/21 0021 12/07/21 0210  NA  --    < > 134* 139 139 135 137  K  --    < > 3.9 3.4* 3.3* 4.6 4.2  CL  --    < > 101 113* 109 106 107  CO2  --    < > 23 17* 18* 23 19*  GLUCOSE  --    < > 87 60* 79 85 75  BUN  --    < > <5* <5* <5* <5* <5*  CREATININE  --    < > 0.63 0.48 0.53 0.48 0.46  CALCIUM  --    < > 8.5* 7.8* 7.9* 7.7* 7.9*  AST  --   --  81*  --  59* 79* 55*  ALT  --   --  34  --  30 34 25  ALKPHOS  --   --  124  --  94 120 104  BILITOT  --   --  2.7*  --  0.9 0.9 1.0  ALBUMIN  --   --  3.0*  --  2.4* 2.6* 2.4*  MG  --    < > 2.2 1.9 1.5* 2.1 1.6*  TSH 2.548  --   --   --   --   --   --   BNP  --   --   --   --  150.5* 97.9 153.6*   < > = values in this interval not displayed.      RADIOLOGY STUDIES/RESULTS: No results found.   LOS: 4 days   Signature  Lala Lund M.D on 12/07/2021 at 10:15 AM   -  To page go to [email or web address]

## 2021-12-08 DIAGNOSIS — R531 Weakness: Secondary | ICD-10-CM | POA: Diagnosis not present

## 2021-12-08 LAB — CBC WITH DIFFERENTIAL/PLATELET
Abs Immature Granulocytes: 0.01 10*3/uL (ref 0.00–0.07)
Basophils Absolute: 0 10*3/uL (ref 0.0–0.1)
Basophils Relative: 1 %
Eosinophils Absolute: 0.2 10*3/uL (ref 0.0–0.5)
Eosinophils Relative: 4 %
HCT: 35.7 % — ABNORMAL LOW (ref 36.0–46.0)
Hemoglobin: 11.3 g/dL — ABNORMAL LOW (ref 12.0–15.0)
Immature Granulocytes: 0 %
Lymphocytes Relative: 50 %
Lymphs Abs: 2.1 10*3/uL (ref 0.7–4.0)
MCH: 34.6 pg — ABNORMAL HIGH (ref 26.0–34.0)
MCHC: 31.7 g/dL (ref 30.0–36.0)
MCV: 109.2 fL — ABNORMAL HIGH (ref 80.0–100.0)
Monocytes Absolute: 0.3 10*3/uL (ref 0.1–1.0)
Monocytes Relative: 8 %
Neutro Abs: 1.6 10*3/uL — ABNORMAL LOW (ref 1.7–7.7)
Neutrophils Relative %: 37 %
Platelets: 232 10*3/uL (ref 150–400)
RBC: 3.27 MIL/uL — ABNORMAL LOW (ref 3.87–5.11)
RDW: 12.9 % (ref 11.5–15.5)
WBC: 4.2 10*3/uL (ref 4.0–10.5)
nRBC: 0 % (ref 0.0–0.2)

## 2021-12-08 LAB — COMPREHENSIVE METABOLIC PANEL
ALT: 34 U/L (ref 0–44)
AST: 71 U/L — ABNORMAL HIGH (ref 15–41)
Albumin: 2.6 g/dL — ABNORMAL LOW (ref 3.5–5.0)
Alkaline Phosphatase: 129 U/L — ABNORMAL HIGH (ref 38–126)
Anion gap: 11 (ref 5–15)
BUN: <5 mg/dL — ABNORMAL LOW (ref 6–20)
CO2: 22 mmol/L (ref 22–32)
Calcium: 8.4 mg/dL — ABNORMAL LOW (ref 8.9–10.3)
Chloride: 104 mmol/L (ref 98–111)
Creatinine, Ser: 0.45 mg/dL (ref 0.44–1.00)
GFR, Estimated: >60 mL/min (ref >60)
Glucose, Bld: 81 mg/dL (ref 70–99)
Potassium: 3.4 mmol/L — ABNORMAL LOW (ref 3.5–5.1)
Sodium: 137 mmol/L (ref 135–145)
Total Bilirubin: 0.7 mg/dL (ref 0.3–1.2)
Total Protein: 7.5 g/dL (ref 6.5–8.1)

## 2021-12-08 LAB — VITAMIN E
Vitamin E (Alpha Tocopherol): 7.5 mg/L (ref 5.9–19.4)
Vitamin E(Gamma Tocopherol): 0.7 mg/L (ref 0.7–4.9)

## 2021-12-08 LAB — MAGNESIUM: Magnesium: 1.7 mg/dL (ref 1.7–2.4)

## 2021-12-08 MED ORDER — MAGNESIUM SULFATE 2 GM/50ML IV SOLN
2.0000 g | Freq: Once | INTRAVENOUS | Status: AC
  Administered 2021-12-08: 2 g via INTRAVENOUS
  Filled 2021-12-08: qty 50

## 2021-12-08 MED ORDER — POTASSIUM CHLORIDE CRYS ER 20 MEQ PO TBCR
40.0000 meq | EXTENDED_RELEASE_TABLET | Freq: Once | ORAL | Status: AC
  Administered 2021-12-08: 40 meq via ORAL
  Filled 2021-12-08: qty 2

## 2021-12-08 NOTE — Progress Notes (Signed)
PROGRESS NOTE        PATIENT DETAILS Name: Kimberly [name] Age: 36 y.o. Sex: female Date of Birth: 09-09-1986 Admit Date: 12/02/2021 Admitting Physician Marcelyn Bruins, MD [id number], Purcell Nails, MD  Brief Summary: 36 year old with history of GBS January 2020 (Tx w IVIG)-subsequently hospitalized a few days later when her thiamine levels came back severely low (Tx w high dose IV Thiamine)-subsequently left with some chronic residual paresthesias-presented to the ED on 12/02/2021 with rapidly progressive paresthesias  Significant events: 1/26>> admit to [facility] for evaluation of rapidly progressive paresthesia.  Significant imaging studies: 1/27>> C-spine/T-spine/L-spine MRI: No cord lesions-no root enhancements.  Procedures: 1/26>> lumbar puncture by ED MD.  Significant studies: 1/26>>TSH: 2.54 (normal) 1/26>> CSF protein 18, WBC 1 1/26>> vitamin B1: Pending 1/27>> RPR/B3/B6/copper: Pending 1/27>> folate: 3 (low) 1/27>> vitamin B12: 437  Microbiology data: 1/27>> COVID/flu PCR: Negative 1/27>> HIV: Nonreactive 1/26>> CSF culture: Pending   Subjective:   Patient in bed, appears comfortable, denies any headache, no fever, no chest pain or pressure, no shortness of breath , no abdominal pain.  Bilateral lower extremity tingling numbness and some subjective weakness    Objective: Vitals: Blood pressure 90/66, pulse 79, temperature 97.9 F (36.6 C), temperature source Oral, resp. rate 19, height 5\' 7"  (1.702 m), weight 59.3 kg, SpO2 99 %.   Exam:  Awake Alert, No new F.N deficits, Normal affect .AT,PERRAL Supple Neck, No JVD,   Symmetrical Chest wall movement, Good air movement bilaterally, CTAB RRR,No Gallops, Rubs or new Murmurs,  +ve B.Sounds, Abd Soft, No tenderness,   No Cyanosis, Clubbing or edema    Assessment/Plan: * Rapidly progressive ascending paresthesias Rapidly progressive bilateral lower extremity ascending paresthesias,  mild weakness with known past medical history of beriberi and Guillain-Barr syndrome.  Currently being seen by neurology, on IV thiamine, B6 and folic acid supplementation, case discussed with neurology she is getting 5-day trial of IVIG starting on 12/05/2021. Pending Vit E.  She will have to follow outpatient with neurology posttreatment along with GI, I think most of her issues are arising from severe micronutrient dietary deficiency, she seems to have chronic diarrhea for several years and also has significant history of alcohol abuse in the past with known thiamine and folic acid deficiency with B12 being borderline.  Smoking - counseled to quit.  History of Guillain-Barre syndrome January 2020 CSF studies/MRI spine not consistent with GBS-neurology following-we will defer initiation of IVIG to the neurology.  History of dry beriberi January 2020- (present on admission) On IV thiamine empirically-was also deficient in folic acid levels, G40 levels were borderline.  All being supplemented.  She has ongoing diarrhea for several years with history of alcohol abuse in the past.  Needs close outpatient GI follow-up for possible malabsorption and multiple micronutrien/vitamin deficiencies.  She follows with Dr. Simona Huh with [facility].  Folic acid deficiency- (present on admission) Continue to supplement.  Unclear if this is the cause of her presenting paresthesias.  Polyneuropathy Per patient-she has some residual neuropathy from her prior episode of GBS-remains on Neurontin.  Taper off narcotics no role of narcotics and neuropathy pain.  GERD (gastroesophageal reflux disease)- (present on admission) Continue PPI cautiously-see above  Anxiety/depression- (present on admission) Continue Xanax/Lexapro.  Hypokalemia/hypomagnesemia- (present on admission) Repleted.?  Discontinue Protonix and switch to Pepcid, has intermittent diarrhea nausea vomiting at baseline under the care  of Dr. Loletha Carrow.  Will  monitor for now with supportive care.  Replace electrolytes.   BMI: Estimated body mass index is 20.48 kg/m as calculated from the following:   Height as of this encounter: 5\' 7"  (1.702 m).   Weight as of this encounter: 59.3 kg.    DVT Prophylaxis: SQ Lovenox Procedures: None Consults: Neurology Code Status:Full code Family Communication: None at bedside   Disposition Plan: Status is: Inpatient  Remains inpatient appropriate because: Ascending paresthesias with weakness-work-up in progress-not yet stable for discharge.   Diet: Diet Order             Diet regular Room service appropriate? Yes; Fluid consistency: Thin  Diet effective now                     Antimicrobial agents: Anti-infectives (From admission, onward)    Start     Dose/Rate Route Frequency Ordered Stop   12/03/21 1000  nitrofurantoin (macrocrystal-monohydrate) (MACROBID) capsule 100 mg  Status:  Discontinued        100 mg Oral 2 times daily 12/03/21 0000 12/03/21 1008        MEDICATIONS: Scheduled Meds:  ALPRAZolam  0.5 mg Oral TID   aspirin EC  81 mg Oral Daily   cholecalciferol  1,000 Units Oral Daily   cyanocobalamin  1,000 mcg Subcutaneous Q30 days   enoxaparin (LOVENOX) injection  40 mg Subcutaneous Daily   escitalopram  10 mg Oral Daily   famotidine  20 mg Oral BID   feeding supplement  237 mL Oral BID BM   folic acid  2 mg Intravenous Daily   gabapentin  600 mg Oral TID   multivitamin with minerals  1 tablet Oral Daily   pyridOXINE  100 mg Intravenous Daily   sodium chloride flush  3 mL Intravenous Q12H   [START ON 12/11/2021] thiamine injection  100 mg Intravenous Daily   [START ON 12/10/2021] vitamin B-6  100 mg Oral Daily   Continuous Infusions:  Immune Globulin 10% 25 g (12/07/21 2156)   thiamine injection 250 mg (12/08/21 0945)   PRN Meds:.acetaminophen **OR** acetaminophen, HYDROcodone-acetaminophen, naLOXone (NARCAN)  injection, ondansetron (ZOFRAN) IV   I have  personally reviewed following labs and imaging studies  LABORATORY DATA:  Recent Labs  Lab 12/02/21 1652 12/02/21 1911 12/04/21 0044 12/05/21 0218 12/06/21 0021 12/07/21 0210 12/08/21 0300  WBC 10.3   < > 6.5 4.4 4.4 3.7* 4.2  HGB 15.3*   < > 11.2* 11.0* 11.2* 10.9* 11.3*  HCT 44.0   < > 33.7* 34.3* 34.4* 34.0* 35.7*  PLT 309   < > 224 195 184 200 232  MCV 101.9*   < > 104.7* 108.5* 108.2* 109.7* 109.2*  MCH 35.4*   < > 34.8* 34.8* 35.2* 35.2* 34.6*  MCHC 34.8   < > 33.2 32.1 32.6 32.1 31.7  RDW 12.9   < > 12.9 12.9 13.0 12.9 12.9  LYMPHSABS 2.7  --   --  1.6 2.4 2.1 2.1  MONOABS 0.7  --   --  0.3 0.4 0.2 0.3  EOSABS 0.3  --   --  0.1 0.2 0.2 0.2  BASOSABS 0.1  --   --  0.0 0.0 0.0 0.0   < > = values in this interval not displayed.    Recent Labs  Lab 12/02/21 1652 12/02/21 1911 12/03/21 0517 12/04/21 0044 12/05/21 0218 12/06/21 0021 12/07/21 0210 12/08/21 0300  NA  --    < > 134*  139 139 135 137 137  K  --    < > 3.9 3.4* 3.3* 4.6 4.2 3.4*  CL  --    < > 101 113* 109 106 107 104  CO2  --    < > 23 17* 18* 23 19* 22  GLUCOSE  --    < > 87 60* 79 85 75 81  BUN  --    < > <5* <5* <5* <5* <5* <5*  CREATININE  --    < > 0.63 0.48 0.53 0.48 0.46 0.45  CALCIUM  --    < > 8.5* 7.8* 7.9* 7.7* 7.9* 8.4*  AST  --   --  81*  --  59* 79* 55* 71*  ALT  --   --  34  --  30 34 25 34  ALKPHOS  --   --  124  --  94 120 104 129*  BILITOT  --   --  2.7*  --  0.9 0.9 1.0 0.7  ALBUMIN  --   --  3.0*  --  2.4* 2.6* 2.4* 2.6*  MG  --    < > 2.2 1.9 1.5* 2.1 1.6* 1.7  TSH 2.548  --   --   --   --   --   --   --   BNP  --   --   --   --  150.5* 97.9 153.6*  --    < > = values in this interval not displayed.      RADIOLOGY STUDIES/RESULTS: No results found.   LOS: 5 days   Signature  Lala Lund M.D on 12/08/2021 at 10:35 AM   -  To page go to [email or web address]

## 2021-12-09 ENCOUNTER — Other Ambulatory Visit (HOSPITAL_COMMUNITY): Payer: Self-pay

## 2021-12-09 DIAGNOSIS — R531 Weakness: Secondary | ICD-10-CM | POA: Diagnosis not present

## 2021-12-09 LAB — CBC WITH DIFFERENTIAL/PLATELET
Abs Immature Granulocytes: 0.02 10*3/uL (ref 0.00–0.07)
Basophils Absolute: 0.1 10*3/uL (ref 0.0–0.1)
Basophils Relative: 1 %
Eosinophils Absolute: 0.2 10*3/uL (ref 0.0–0.5)
Eosinophils Relative: 5 %
HCT: 36.7 % (ref 36.0–46.0)
Hemoglobin: 11.8 g/dL — ABNORMAL LOW (ref 12.0–15.0)
Immature Granulocytes: 0 %
Lymphocytes Relative: 65 %
Lymphs Abs: 3 10*3/uL (ref 0.7–4.0)
MCH: 34.7 pg — ABNORMAL HIGH (ref 26.0–34.0)
MCHC: 32.2 g/dL (ref 30.0–36.0)
MCV: 107.9 fL — ABNORMAL HIGH (ref 80.0–100.0)
Monocytes Absolute: 0.3 10*3/uL (ref 0.1–1.0)
Monocytes Relative: 6 %
Neutro Abs: 1.1 10*3/uL — ABNORMAL LOW (ref 1.7–7.7)
Neutrophils Relative %: 23 %
Platelets: 238 10*3/uL (ref 150–400)
RBC: 3.4 MIL/uL — ABNORMAL LOW (ref 3.87–5.11)
RDW: 13 % (ref 11.5–15.5)
WBC: 4.7 10*3/uL (ref 4.0–10.5)
nRBC: 0 % (ref 0.0–0.2)

## 2021-12-09 LAB — COMPREHENSIVE METABOLIC PANEL
ALT: 32 U/L (ref 0–44)
AST: 63 U/L — ABNORMAL HIGH (ref 15–41)
Albumin: 2.4 g/dL — ABNORMAL LOW (ref 3.5–5.0)
Alkaline Phosphatase: 116 U/L (ref 38–126)
Anion gap: 8 (ref 5–15)
BUN: 5 mg/dL — ABNORMAL LOW (ref 6–20)
CO2: 26 mmol/L (ref 22–32)
Calcium: 8.2 mg/dL — ABNORMAL LOW (ref 8.9–10.3)
Chloride: 104 mmol/L (ref 98–111)
Creatinine, Ser: 0.47 mg/dL (ref 0.44–1.00)
GFR, Estimated: >60 mL/min (ref >60)
Glucose, Bld: 89 mg/dL (ref 70–99)
Potassium: 3.8 mmol/L (ref 3.5–5.1)
Sodium: 138 mmol/L (ref 135–145)
Total Bilirubin: 0.6 mg/dL (ref 0.3–1.2)
Total Protein: 7.4 g/dL (ref 6.5–8.1)

## 2021-12-09 LAB — MAGNESIUM: Magnesium: 1.6 mg/dL — ABNORMAL LOW (ref 1.7–2.4)

## 2021-12-09 MED ORDER — THIAMINE HCL 100 MG PO TABS
100.0000 mg | ORAL_TABLET | Freq: Every day | ORAL | 0 refills | Status: DC
  Filled 2021-12-09: qty 30, 30d supply, fill #0

## 2021-12-09 MED ORDER — FAMOTIDINE 20 MG PO TABS
20.0000 mg | ORAL_TABLET | Freq: Every day | ORAL | 0 refills | Status: DC
  Filled 2021-12-09: qty 30, 30d supply, fill #0

## 2021-12-09 MED ORDER — PYRIDOXINE HCL 100 MG PO TABS
100.0000 mg | ORAL_TABLET | Freq: Every day | ORAL | 0 refills | Status: DC
  Filled 2021-12-09: qty 30, 30d supply, fill #0

## 2021-12-09 MED ORDER — VITAMIN D3 25 MCG PO TABS
1000.0000 [IU] | ORAL_TABLET | Freq: Every day | ORAL | 0 refills | Status: DC
  Filled 2021-12-09: qty 30, 30d supply, fill #0

## 2021-12-09 MED ORDER — CERTAVITE/ANTIOXIDANTS PO TABS
1.0000 | ORAL_TABLET | Freq: Every day | ORAL | 0 refills | Status: DC
  Filled 2021-12-09: qty 30, 30d supply, fill #0

## 2021-12-09 MED ORDER — MAGNESIUM SULFATE 4 GM/100ML IV SOLN
4.0000 g | Freq: Once | INTRAVENOUS | Status: AC
  Administered 2021-12-09: 4 g via INTRAVENOUS
  Filled 2021-12-09: qty 100

## 2021-12-09 MED ORDER — VITAMIN B-12 1000 MCG PO TABS
1000.0000 ug | ORAL_TABLET | Freq: Every day | ORAL | 0 refills | Status: DC
  Filled 2021-12-09: qty 30, 30d supply, fill #0

## 2021-12-09 MED ORDER — GABAPENTIN 300 MG PO CAPS
600.0000 mg | ORAL_CAPSULE | Freq: Three times a day (TID) | ORAL | 0 refills | Status: DC
  Filled 2021-12-09: qty 120, 20d supply, fill #0

## 2021-12-09 MED ORDER — FOLIC ACID 1 MG PO TABS
1.0000 mg | ORAL_TABLET | Freq: Every day | ORAL | 0 refills | Status: DC
  Filled 2021-12-09: qty 30, 30d supply, fill #0

## 2021-12-09 NOTE — Discharge Summary (Signed)
Kimberly [name] RJJ:[id number] DOB: December 30, 1985 DOA: 12/02/2021  PCP: Redmond School, MD  Admit date: 12/02/2021  Discharge date: 12/09/2021  Admitted From: Home   Disposition:  Home   Recommendations for Outpatient Follow-up:   Follow up with PCP in 1-2 weeks  PCP Please obtain BMP/CBC, 2 view CXR in 1week,  (see Discharge instructions)   PCP Please follow up on the following pending results: Please monitor  CBC, CMP, Magnesium, TSH, Vitamin B1, B6, K16, D, folic acid levels went to 10 days she also needs close outpatient GI and neurology follow-up.  Her main issues are arising from severe malabsorption.   Home Health: None Equipment/Devices: None  Consultations: Neuro Discharge Condition: Stable    CODE STATUS: Full    Diet Recommendation: Heart Healthy   Diet Order             Diet - low sodium heart healthy           Diet regular Room service appropriate? Yes; Fluid consistency: Thin  Diet effective now                    Chief Complaint  Patient presents with   Numbness     Brief history of present illness from the day of admission and additional interim summary    36 year old with history of GBS January 2020 (Tx w IVIG)-subsequently hospitalized a few days later when her thiamine levels came back severely low (Tx w high dose IV Thiamine)-subsequently left with some chronic residual paresthesias-presented to the ED on 12/02/2021 with rapidly progressive paresthesias   Significant events: 1/26>> admit to [facility] for evaluation of rapidly progressive paresthesia.   Significant imaging studies: 1/27>> C-spine/T-spine/L-spine MRI: No cord lesions-no root enhancements.   Procedures: 1/26>> lumbar puncture by ED MD.                                                                 Hospital Course      Rapidly progressive bilateral lower extremity ascending paresthesias, mild weakness with known past medical history of beriberi and Guillain-Barr syndrome.  Currently being seen by neurology, has been placed on thiamine, B6 and folic acid supplementation, case discussed with neurology she received a 5-day trial of IVIG starting on 12/05/2021, she has finished the course today.  Now will be discharged with outpatient neurology and GI follow-up.     She will have to follow outpatient with neurology posttreatment along with GI, I think most of her issues are arising from severe micronutrient dietary deficiency, she seems to have chronic diarrhea for several years and also has significant history of alcohol abuse in the past with known thiamine and folic acid deficiency with B12 being borderline.   Smoking - counseled to quit.   History of Guillain-Barre syndrome January 2020  CSF studies/MRI spine not consistent with GBS-neurology following-post 5 doses of IVIG by neurology, CSF this admission was not impressive.   History of dry beriberi January 2020- (present on admission) On IV thiamine empirically-was also deficient in folic acid levels, E42 levels were borderline.  All being supplemented.  She has ongoing diarrhea for several years with history of alcohol abuse in the past.  Needs close outpatient GI follow-up for possible malabsorption and multiple micronutrien/vitamin deficiencies.  She follows with Dr. Simona Huh with [facility].   Folic acid deficiency- (present on admission) Continue to supplement.  Unclear if this is the cause of her presenting paresthesias.   Polyneuropathy Per patient-she has some residual neuropathy from her prior episode of GBS-remains on Neurontin.  Taper off narcotics no role of narcotics and neuropathy pain.   GERD (gastroesophageal reflux disease)- (present on admission) Continue PPI cautiously-see above   Anxiety/depression- (present on admission) Continue  Xanax/Lexapro.   Hypokalemia/hypomagnesemia- (present on admission) Repleted.  PCP to monitor.  Protonix switch to Pepcid.     Discharge diagnosis     Principal Problem:   Rapidly progressive ascending paresthesias Active Problems:   History of Guillain-Barre syndrome January 2020   History of dry beriberi January 2020   Hypokalemia/hypomagnesemia   Anxiety/depression   GERD (gastroesophageal reflux disease)   Polyneuropathy   Hypomagnesemia   Folic acid deficiency    Discharge instructions    Discharge Instructions     Diet - low sodium heart healthy   Complete by: As directed    Discharge instructions   Complete by: As directed    Follow with Primary MD Redmond School, MD in 7 days also follow-up with recommended GI and neurologist within 1 to 2 weeks of discharge  Get CBC, CMP, Magnesium, TSH, Vitamin B1, B6, P53, D, folic acid levels-  checked next visit within 1 week by Primary MD    Activity: As tolerated with Full fall precautions use walker/cane & assistance as needed  Disposition Home    Diet: Heart Healthy   Special Instructions: If you have smoked or chewed Tobacco  in the last 2 yrs please stop smoking, stop any regular Alcohol  and or any Recreational drug use.  On your next visit with your primary care physician please Get Medicines reviewed and adjusted.  Please request your Prim.MD to go over all Hospital Tests and Procedure/Radiological results at the follow up, please get all Hospital records sent to your Prim MD by signing hospital release before you go home.  If you experience worsening of your admission symptoms, develop shortness of breath, life threatening emergency, suicidal or homicidal thoughts you must seek medical attention immediately by calling 911 or calling your MD immediately  if symptoms less severe.  You Must read complete instructions/literature along with all the possible adverse reactions/side effects for all the Medicines you  take and that have been prescribed to you. Take any new Medicines after you have completely understood and accpet all the possible adverse reactions/side effects.   Increase activity slowly   Complete by: As directed        Discharge Medications   Allergies as of 12/09/2021       Reactions   Tape Itching, Other (See Comments)   Clear, "plastic" tape is the culprit        Medication List     STOP taking these medications    nitrofurantoin (macrocrystal-monohydrate) 100 MG capsule Commonly known as: MACROBID   OXcarbazepine 150 MG tablet Commonly known as:  TRILEPTAL   pantoprazole 40 MG tablet Commonly known as: PROTONIX   pregabalin 200 MG capsule Commonly known as: Lyrica       TAKE these medications    ALPRAZolam 0.5 MG tablet Commonly known as: XANAX Take 0.5 mg by mouth 3 (three) times daily.   calcium carbonate 750 MG chewable tablet Commonly known as: TUMS EX Chew 1-2 tablets by mouth as needed (for heartburn or indigestion).   CertaVite/Antioxidants Tabs Take 1 tablet by mouth daily. Start taking on: December 10, 2021   escitalopram 10 MG tablet Commonly known as: LEXAPRO Take 10 mg by mouth daily.   famotidine 20 MG tablet Commonly known as: PEPCID Take 1 tablet (20 mg total) by mouth daily.   folic acid 1 MG tablet Commonly known as: FOLVITE Take 1 tablet (1 mg total) by mouth daily.   gabapentin 300 MG capsule Commonly known as: NEURONTIN Take 2 capsules (600 mg total) by mouth 3 (three) times daily. What changed: how much to take   hydrocortisone cream 1 % Commonly known as: Preparation H Apply 1 application topically as needed for itching.   ibuprofen 200 MG tablet Commonly known as: ADVIL Take 800 mg by mouth every 6 (six) hours as needed for mild pain or moderate pain.   ondansetron 4 MG tablet Commonly known as: ZOFRAN Take every 4-6 hours as needed.   pyridoxine 100 MG tablet Commonly known as: B-6 Take 1 tablet (100 mg  total) by mouth daily. Start taking on: December 10, 2021   thiamine 100 MG tablet Take 1 tablet (100 mg total) by mouth daily.   vitamin B-12 1000 MCG tablet Commonly known as: CYANOCOBALAMIN Take 1 tablet (1,000 mcg total) by mouth daily.   Vitamin D3 25 MCG tablet Commonly known as: Vitamin D Take 1 tablet (1,000 Units total) by mouth daily. Start taking on: December 10, 2021         Follow-up Information     Redmond School, MD. Schedule an appointment as soon as possible for a visit in 1 week(s).   Specialty: Internal Medicine Contact information: [address] [place] [address] [phone]         [facility] Follow up.   Why: Polyneuropathy possible GBS Contact information: [address]     [place] [address] [phone]        Doran Stabler, MD. Schedule an appointment as soon as possible for a visit in 1 week(s).   Specialty: Gastroenterology Why: Multiple vitamin deficiency and ongoing diarrhea Contact information: [address] [place] [address] [phone]                 Major procedures and Radiology Reports - PLEASE review detailed and final reports thoroughly  -       MR CERVICAL SPINE W WO CONTRAST  Result Date: 12/03/2021 CLINICAL DATA:  Acute myelopathy. History of Guillain-Barre. Progressive weakness and numbness. CSF leak suspected EXAM: MRI CERVICAL, THORACIC AND LUMBAR SPINE WITHOUT AND WITH CONTRAST TECHNIQUE: Multiplanar and multiecho pulse sequences of the cervical spine, to include the craniocervical junction and cervicothoracic junction, and thoracic and lumbar spine, were obtained without and with intravenous contrast. CONTRAST:  59mL GADAVIST GADOBUTROL 1 MMOL/ML IV SOLN COMPARISON:  11/16/2018 FINDINGS: MRI CERVICAL SPINE FINDINGS Alignment: Normal Vertebrae: No acute fracture, evidence of discitis, or bone lesion. Cord: Normal signal and  morphology. No evidence of intrathecal collection. No dorsal column signal abnormality. Posterior Fossa,  vertebral arteries, paraspinal tissues: No evidence of perispinal mass or inflammation. Right anterior lobe thyroid nodule measuring 9 mm. No followup recommended (ref: J Am Coll Radiol. 2015 Feb;12(2): 143-50). Disc levels: C2-3: Unremarkable. C3-4: Unremarkable. C4-5: Minor disc bulging C5-6: Small central disc protrusion. C6-7: Minor disc bulging C7-T1:Unremarkable. MRI THORACIC SPINE FINDINGS Alignment: Mild anterolisthesis at T11-12. Prominent inter spinous with at this level but no facet subluxation. Vertebrae: Remote T12 compression fracture with cement augmentation. Remote superior endplate fractures at G2-X5. Cord:  Normal signal and morphology.  No spinal canal collection. Paraspinal and other soft tissues: Negative Disc levels: T11-12 disc space narrowing. T6-7 disc space narrowing. No neural impingement. MRI LUMBAR SPINE FINDINGS Segmentation:  5 lumbar type vertebrae Alignment:  Normal Vertebrae:  No fracture, evidence of discitis, or bone lesion. Conus medullaris and cauda equina: Conus extends to the L1-2 level. Conus and cauda equina appear normal. Paraspinal and other soft tissues: No perispinal mass or inflammation. Disc levels: Mild disc desiccation and narrowing with annular fissure and shallow protrusion at L5-S1. No neural compression. IMPRESSION: 1. Normal MRI of the cord and cauda equina. No CSF collection to implicate leak. 2. No neural impingement. 3. Remote and healed fractures of T1, T2, T3, and T12 vertebral bodies. Electronically Signed   By: Jorje Guild M.D.   On: 12/03/2021 05:01   MR THORACIC SPINE W WO CONTRAST  Result Date: 12/03/2021 CLINICAL DATA:  Acute myelopathy. History of Guillain-Barre. Progressive weakness and numbness. CSF leak suspected EXAM: MRI CERVICAL, THORACIC AND LUMBAR SPINE WITHOUT AND WITH CONTRAST TECHNIQUE: Multiplanar and multiecho pulse sequences  of the cervical spine, to include the craniocervical junction and cervicothoracic junction, and thoracic and lumbar spine, were obtained without and with intravenous contrast. CONTRAST:  61mL GADAVIST GADOBUTROL 1 MMOL/ML IV SOLN COMPARISON:  11/16/2018 FINDINGS: MRI CERVICAL SPINE FINDINGS Alignment: Normal Vertebrae: No acute fracture, evidence of discitis, or bone lesion. Cord: Normal signal and morphology. No evidence of intrathecal collection. No dorsal column signal abnormality. Posterior Fossa, vertebral arteries, paraspinal tissues: No evidence of perispinal mass or inflammation. Right anterior lobe thyroid nodule measuring 9 mm. No followup recommended (ref: J Am Coll Radiol. 2015 Feb;12(2): 143-50). Disc levels: C2-3: Unremarkable. C3-4: Unremarkable. C4-5: Minor disc bulging C5-6: Small central disc protrusion. C6-7: Minor disc bulging C7-T1:Unremarkable. MRI THORACIC SPINE FINDINGS Alignment: Mild anterolisthesis at T11-12. Prominent inter spinous with at this level but no facet subluxation. Vertebrae: Remote T12 compression fracture with cement augmentation. Remote superior endplate fractures at M8-U1. Cord:  Normal signal and morphology.  No spinal canal collection. Paraspinal and other soft tissues: Negative Disc levels: T11-12 disc space narrowing. T6-7 disc space narrowing. No neural impingement. MRI LUMBAR SPINE FINDINGS Segmentation:  5 lumbar type vertebrae Alignment:  Normal Vertebrae:  No fracture, evidence of discitis, or bone lesion. Conus medullaris and cauda equina: Conus extends to the L1-2 level. Conus and cauda equina appear normal. Paraspinal and other soft tissues: No perispinal mass or inflammation. Disc levels: Mild disc desiccation and narrowing with annular fissure and shallow protrusion at L5-S1. No neural compression. IMPRESSION: 1. Normal MRI of the cord and cauda equina. No CSF collection to implicate leak. 2. No neural impingement. 3. Remote and healed fractures of T1, T2, T3,  and T12 vertebral bodies. Electronically Signed   By: Jorje Guild M.D.   On: 12/03/2021 05:01   MR Lumbar Spine W Wo Contrast  Result Date: 12/03/2021 CLINICAL DATA:  Acute myelopathy. History of Guillain-Barre. Progressive weakness and numbness. CSF leak  suspected EXAM: MRI CERVICAL, THORACIC AND LUMBAR SPINE WITHOUT AND WITH CONTRAST TECHNIQUE: Multiplanar and multiecho pulse sequences of the cervical spine, to include the craniocervical junction and cervicothoracic junction, and thoracic and lumbar spine, were obtained without and with intravenous contrast. CONTRAST:  67mL GADAVIST GADOBUTROL 1 MMOL/ML IV SOLN COMPARISON:  11/16/2018 FINDINGS: MRI CERVICAL SPINE FINDINGS Alignment: Normal Vertebrae: No acute fracture, evidence of discitis, or bone lesion. Cord: Normal signal and morphology. No evidence of intrathecal collection. No dorsal column signal abnormality. Posterior Fossa, vertebral arteries, paraspinal tissues: No evidence of perispinal mass or inflammation. Right anterior lobe thyroid nodule measuring 9 mm. No followup recommended (ref: J Am Coll Radiol. 2015 Feb;12(2): 143-50). Disc levels: C2-3: Unremarkable. C3-4: Unremarkable. C4-5: Minor disc bulging C5-6: Small central disc protrusion. C6-7: Minor disc bulging C7-T1:Unremarkable. MRI THORACIC SPINE FINDINGS Alignment: Mild anterolisthesis at T11-12. Prominent inter spinous with at this level but no facet subluxation. Vertebrae: Remote T12 compression fracture with cement augmentation. Remote superior endplate fractures at X5-Q0. Cord:  Normal signal and morphology.  No spinal canal collection. Paraspinal and other soft tissues: Negative Disc levels: T11-12 disc space narrowing. T6-7 disc space narrowing. No neural impingement. MRI LUMBAR SPINE FINDINGS Segmentation:  5 lumbar type vertebrae Alignment:  Normal Vertebrae:  No fracture, evidence of discitis, or bone lesion. Conus medullaris and cauda equina: Conus extends to the L1-2 level.  Conus and cauda equina appear normal. Paraspinal and other soft tissues: No perispinal mass or inflammation. Disc levels: Mild disc desiccation and narrowing with annular fissure and shallow protrusion at L5-S1. No neural compression. IMPRESSION: 1. Normal MRI of the cord and cauda equina. No CSF collection to implicate leak. 2. No neural impingement. 3. Remote and healed fractures of T1, T2, T3, and T12 vertebral bodies. Electronically Signed   By: Jorje Guild M.D.   On: 12/03/2021 05:01     Today   Subjective    Gladine Plude today has no headache,no chest abdominal pain,no new weakness tingling or numbness, feels much better wants to go home today.     Objective   Blood pressure 105/79, pulse 74, temperature 98.2 F (36.8 C), temperature source Oral, resp. rate 16, height 5\' 7"  (1.702 m), weight 59.3 kg, SpO2 100 %.   Intake/Output Summary (Last 24 hours) at 12/09/2021 1027 Last data filed at 12/08/2021 1700 Gross per 24 hour  Intake 287 ml  Output --  Net 287 ml    Exam  Awake Alert, No new F.N deficits,    Rafael Gonzalez.AT,PERRAL Supple Neck,   Symmetrical Chest wall movement, Good air movement bilaterally, CTAB RRR,No Gallops,   +ve B.Sounds, Abd Soft, Non tender,  No Cyanosis, Clubbing or edema    Data Review   CBC w Diff:  Lab Results  Component Value Date   WBC 4.7 12/09/2021   HGB 11.8 (L) 12/09/2021   HCT 36.7 12/09/2021   HCT 39.5 11/16/2018   PLT 238 12/09/2021   LYMPHOPCT 65 12/09/2021   MONOPCT 6 12/09/2021   EOSPCT 5 12/09/2021   BASOPCT 1 12/09/2021    CMP:  Lab Results  Component Value Date   NA 138 12/09/2021   K 3.8 12/09/2021   CL 104 12/09/2021   CO2 26 12/09/2021   BUN 5 (L) 12/09/2021   CREATININE 0.47 12/09/2021   PROT 7.4 12/09/2021   ALBUMIN 2.4 (L) 12/09/2021   BILITOT 0.6 12/09/2021   ALKPHOS 116 12/09/2021   AST 63 (H) 12/09/2021   ALT 32 12/09/2021  .  Total Time in preparing paper work, data evaluation and todays exam - 11  minutes  Lala Lund M.D on 12/09/2021 at 10:27 AM  [facility]

## 2021-12-09 NOTE — Plan of Care (Signed)

## 2021-12-09 NOTE — TOC Transition Note (Signed)
Transition of Care [facility]) - CM/SW Discharge Note   Patient Details  Name: Kimberly [name] MRN: [id number] Date of Birth: [birth year]  Transition of Care [facility]) CM/SW Contact:  Benard Halsted, LCSW Phone Number: 12/09/2021, 3:58 PM   Clinical Narrative:    CSW spoke with patient regarding ETOH use. Patient reported that she has drank a lot of alcohol since age 36. She stated she needs to do a program beyond AA as that has not helped. She reported that her husband wants her to do an outpatient program and come home to help with the kids but she is not sure that is in her best interest. CSW provided active listening and resources for all levels of care such as [facility] and suggested she go to [facility] if she is interested in any of their services for outpatient level. Patient reported no other needs and has a ride home.    Final next level of care: Home/Self Care Barriers to Discharge: No Barriers Identified   Patient Goals and CMS Choice        Discharge Placement                       Discharge Plan and Services In-house Referral: Clinical Social Work                                   Social Determinants of Health (SDOH) Interventions     Readmission Risk Interventions No flowsheet data found.

## 2021-12-11 LAB — MISC LABCORP TEST (SEND OUT): Labcorp test code: 70115

## 2021-12-13 DIAGNOSIS — K219 Gastro-esophageal reflux disease without esophagitis: Secondary | ICD-10-CM | POA: Diagnosis not present

## 2021-12-13 DIAGNOSIS — G610 Guillain-Barre syndrome: Secondary | ICD-10-CM | POA: Diagnosis not present

## 2021-12-13 DIAGNOSIS — Z681 Body mass index (BMI) 19 or less, adult: Secondary | ICD-10-CM | POA: Diagnosis not present

## 2021-12-13 DIAGNOSIS — E5111 Dry beriberi: Secondary | ICD-10-CM | POA: Diagnosis not present

## 2021-12-13 DIAGNOSIS — D520 Dietary folate deficiency anemia: Secondary | ICD-10-CM | POA: Diagnosis not present

## 2021-12-29 DIAGNOSIS — F17200 Nicotine dependence, unspecified, uncomplicated: Secondary | ICD-10-CM | POA: Diagnosis not present

## 2021-12-29 DIAGNOSIS — F4312 Post-traumatic stress disorder, chronic: Secondary | ICD-10-CM | POA: Diagnosis not present

## 2021-12-29 DIAGNOSIS — E46 Unspecified protein-calorie malnutrition: Secondary | ICD-10-CM | POA: Diagnosis not present

## 2021-12-29 DIAGNOSIS — F1320 Sedative, hypnotic or anxiolytic dependence, uncomplicated: Secondary | ICD-10-CM | POA: Diagnosis not present

## 2021-12-29 DIAGNOSIS — R12 Heartburn: Secondary | ICD-10-CM | POA: Diagnosis not present

## 2021-12-29 DIAGNOSIS — F1020 Alcohol dependence, uncomplicated: Secondary | ICD-10-CM | POA: Diagnosis not present

## 2021-12-29 DIAGNOSIS — F1210 Cannabis abuse, uncomplicated: Secondary | ICD-10-CM | POA: Diagnosis not present

## 2021-12-29 DIAGNOSIS — R634 Abnormal weight loss: Secondary | ICD-10-CM | POA: Diagnosis not present

## 2021-12-29 DIAGNOSIS — G650 Sequelae of Guillain-Barre syndrome: Secondary | ICD-10-CM | POA: Diagnosis not present

## 2021-12-29 DIAGNOSIS — F419 Anxiety disorder, unspecified: Secondary | ICD-10-CM | POA: Diagnosis not present

## 2021-12-29 DIAGNOSIS — F329 Major depressive disorder, single episode, unspecified: Secondary | ICD-10-CM | POA: Diagnosis not present

## 2022-01-03 DIAGNOSIS — F1210 Cannabis abuse, uncomplicated: Secondary | ICD-10-CM | POA: Diagnosis not present

## 2022-01-03 DIAGNOSIS — F1020 Alcohol dependence, uncomplicated: Secondary | ICD-10-CM | POA: Diagnosis not present

## 2022-01-03 DIAGNOSIS — R12 Heartburn: Secondary | ICD-10-CM | POA: Diagnosis not present

## 2022-01-03 DIAGNOSIS — E46 Unspecified protein-calorie malnutrition: Secondary | ICD-10-CM | POA: Diagnosis not present

## 2022-01-03 DIAGNOSIS — F17200 Nicotine dependence, unspecified, uncomplicated: Secondary | ICD-10-CM | POA: Diagnosis not present

## 2022-01-03 DIAGNOSIS — F4312 Post-traumatic stress disorder, chronic: Secondary | ICD-10-CM | POA: Diagnosis not present

## 2022-01-03 DIAGNOSIS — F419 Anxiety disorder, unspecified: Secondary | ICD-10-CM | POA: Diagnosis not present

## 2022-01-03 DIAGNOSIS — R634 Abnormal weight loss: Secondary | ICD-10-CM | POA: Diagnosis not present

## 2022-01-03 DIAGNOSIS — F329 Major depressive disorder, single episode, unspecified: Secondary | ICD-10-CM | POA: Diagnosis not present

## 2022-01-03 DIAGNOSIS — G650 Sequelae of Guillain-Barre syndrome: Secondary | ICD-10-CM | POA: Diagnosis not present

## 2022-01-03 DIAGNOSIS — F1320 Sedative, hypnotic or anxiolytic dependence, uncomplicated: Secondary | ICD-10-CM | POA: Diagnosis not present

## 2022-01-17 DIAGNOSIS — F1020 Alcohol dependence, uncomplicated: Secondary | ICD-10-CM | POA: Diagnosis not present

## 2022-01-18 DIAGNOSIS — F1020 Alcohol dependence, uncomplicated: Secondary | ICD-10-CM | POA: Diagnosis not present

## 2022-01-19 DIAGNOSIS — F1020 Alcohol dependence, uncomplicated: Secondary | ICD-10-CM | POA: Diagnosis not present

## 2022-01-20 DIAGNOSIS — F1020 Alcohol dependence, uncomplicated: Secondary | ICD-10-CM | POA: Diagnosis not present

## 2022-01-21 DIAGNOSIS — F1020 Alcohol dependence, uncomplicated: Secondary | ICD-10-CM | POA: Diagnosis not present

## 2022-01-22 DIAGNOSIS — F1020 Alcohol dependence, uncomplicated: Secondary | ICD-10-CM | POA: Diagnosis not present

## 2022-01-23 DIAGNOSIS — F1020 Alcohol dependence, uncomplicated: Secondary | ICD-10-CM | POA: Diagnosis not present

## 2022-01-24 ENCOUNTER — Telehealth (HOSPITAL_COMMUNITY): Payer: Self-pay | Admitting: Licensed Clinical Social Worker

## 2022-01-24 DIAGNOSIS — F1020 Alcohol dependence, uncomplicated: Secondary | ICD-10-CM | POA: Diagnosis not present

## 2022-01-24 NOTE — Telephone Encounter (Signed)
The therapist receives a call from [place] at [facility] saying that the client is being discharged on 01/26/22 and is being referred for [facility] IOP. ? ?The therapist schedules her for a CCA on 01/31/22 at 3 p.m. ? ?Adam Phenix, MA, LCSW, [place], LCAS ?

## 2022-01-25 DIAGNOSIS — F1020 Alcohol dependence, uncomplicated: Secondary | ICD-10-CM | POA: Diagnosis not present

## 2022-01-31 ENCOUNTER — Other Ambulatory Visit: Payer: Self-pay

## 2022-01-31 ENCOUNTER — Telehealth (HOSPITAL_COMMUNITY): Payer: Self-pay | Admitting: Licensed Clinical Social Worker

## 2022-01-31 ENCOUNTER — Ambulatory Visit (INDEPENDENT_AMBULATORY_CARE_PROVIDER_SITE_OTHER): Payer: BC Managed Care – PPO | Admitting: Licensed Clinical Social Worker

## 2022-01-31 DIAGNOSIS — F1020 Alcohol dependence, uncomplicated: Secondary | ICD-10-CM | POA: Diagnosis not present

## 2022-01-31 DIAGNOSIS — D520 Dietary folate deficiency anemia: Secondary | ICD-10-CM | POA: Diagnosis not present

## 2022-01-31 DIAGNOSIS — F1210 Cannabis abuse, uncomplicated: Secondary | ICD-10-CM

## 2022-01-31 DIAGNOSIS — F419 Anxiety disorder, unspecified: Secondary | ICD-10-CM

## 2022-01-31 DIAGNOSIS — N63 Unspecified lump in unspecified breast: Secondary | ICD-10-CM | POA: Diagnosis not present

## 2022-01-31 DIAGNOSIS — E46 Unspecified protein-calorie malnutrition: Secondary | ICD-10-CM | POA: Diagnosis not present

## 2022-01-31 DIAGNOSIS — F17219 Nicotine dependence, cigarettes, with unspecified nicotine-induced disorders: Secondary | ICD-10-CM

## 2022-01-31 DIAGNOSIS — Z1331 Encounter for screening for depression: Secondary | ICD-10-CM | POA: Diagnosis not present

## 2022-01-31 DIAGNOSIS — Z6821 Body mass index (BMI) 21.0-21.9, adult: Secondary | ICD-10-CM | POA: Diagnosis not present

## 2022-01-31 NOTE — Telephone Encounter (Signed)
The therapist sends a fax to Kimberly [name] at [place] her the client who is her referral attended her appointment today. ? ?Adam Phenix, MA, LCSW, [facility], LCAS ?01/31/2022 ?

## 2022-01-31 NOTE — Progress Notes (Signed)
Kimberly [name] agrees to the Care Plan developed today.  ? ?
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
## 2022-01-31 NOTE — Plan of Care (Signed)
?  Problem:  Alcohol ?Goal:  Aubriella will abstain from using drugs and alcohol 7/7 days of the week based on self-report and weekly UDS by 03/30/2022.  ?Outcome: Not Applicable ?  ?

## 2022-02-01 ENCOUNTER — Ambulatory Visit (INDEPENDENT_AMBULATORY_CARE_PROVIDER_SITE_OTHER): Payer: No Typology Code available for payment source | Admitting: Gastroenterology

## 2022-02-01 ENCOUNTER — Other Ambulatory Visit (HOSPITAL_COMMUNITY): Payer: Self-pay | Admitting: Internal Medicine

## 2022-02-01 ENCOUNTER — Encounter (INDEPENDENT_AMBULATORY_CARE_PROVIDER_SITE_OTHER): Payer: Self-pay | Admitting: Gastroenterology

## 2022-02-02 ENCOUNTER — Other Ambulatory Visit (HOSPITAL_COMMUNITY): Payer: Self-pay | Admitting: Medical

## 2022-02-02 ENCOUNTER — Other Ambulatory Visit (HOSPITAL_COMMUNITY): Payer: BC Managed Care – PPO | Attending: Psychiatry | Admitting: Licensed Clinical Social Worker

## 2022-02-02 ENCOUNTER — Other Ambulatory Visit: Payer: Self-pay

## 2022-02-02 ENCOUNTER — Other Ambulatory Visit (HOSPITAL_COMMUNITY): Payer: Self-pay | Admitting: Internal Medicine

## 2022-02-02 ENCOUNTER — Encounter (INDEPENDENT_AMBULATORY_CARE_PROVIDER_SITE_OTHER): Payer: Self-pay | Admitting: *Deleted

## 2022-02-02 VITALS — BP 117/77 | HR 76 | Resp 18 | Ht 67.5 in | Wt 146.5 lb

## 2022-02-02 DIAGNOSIS — F1020 Alcohol dependence, uncomplicated: Secondary | ICD-10-CM | POA: Diagnosis not present

## 2022-02-02 DIAGNOSIS — F1210 Cannabis abuse, uncomplicated: Secondary | ICD-10-CM | POA: Insufficient documentation

## 2022-02-02 DIAGNOSIS — F1320 Sedative, hypnotic or anxiolytic dependence, uncomplicated: Secondary | ICD-10-CM

## 2022-02-02 DIAGNOSIS — F10288 Alcohol dependence with other alcohol-induced disorder: Secondary | ICD-10-CM | POA: Insufficient documentation

## 2022-02-02 DIAGNOSIS — F17219 Nicotine dependence, cigarettes, with unspecified nicotine-induced disorders: Secondary | ICD-10-CM

## 2022-02-02 DIAGNOSIS — F1120 Opioid dependence, uncomplicated: Secondary | ICD-10-CM | POA: Diagnosis not present

## 2022-02-02 DIAGNOSIS — F1310 Sedative, hypnotic or anxiolytic abuse, uncomplicated: Secondary | ICD-10-CM | POA: Diagnosis not present

## 2022-02-02 DIAGNOSIS — F419 Anxiety disorder, unspecified: Secondary | ICD-10-CM | POA: Diagnosis not present

## 2022-02-02 DIAGNOSIS — N63 Unspecified lump in unspecified breast: Secondary | ICD-10-CM

## 2022-02-02 DIAGNOSIS — E43 Unspecified severe protein-calorie malnutrition: Secondary | ICD-10-CM

## 2022-02-02 DIAGNOSIS — G621 Alcoholic polyneuropathy: Secondary | ICD-10-CM | POA: Insufficient documentation

## 2022-02-02 DIAGNOSIS — Z79899 Other long term (current) drug therapy: Secondary | ICD-10-CM | POA: Insufficient documentation

## 2022-02-02 DIAGNOSIS — Z8659 Personal history of other mental and behavioral disorders: Secondary | ICD-10-CM

## 2022-02-02 DIAGNOSIS — Z8669 Personal history of other diseases of the nervous system and sense organs: Secondary | ICD-10-CM

## 2022-02-02 DIAGNOSIS — F4312 Post-traumatic stress disorder, chronic: Secondary | ICD-10-CM

## 2022-02-02 MED ORDER — PREGABALIN 200 MG PO CAPS: 200.0000 mg | ORAL_CAPSULE | Freq: Three times a day (TID) | ORAL | 2 refills | Status: DC

## 2022-02-02 MED ORDER — PANTOPRAZOLE SODIUM 40 MG PO TBEC: 40.0000 mg | DELAYED_RELEASE_TABLET | Freq: Every day | ORAL | 1 refills | Status: DC

## 2022-02-02 NOTE — Progress Notes (Signed)
Daily Group Progress Note ? ?Program: CD IOP ? ? ?Group Time: 9 a.m. to 11: 30 a.m. ? ?Type of Therapy: Process ? ?Topic: The therapist checks in with group members, assesses for SI/HI/psychosis and overall level of functioning. The therapist inquires about sobriety date and number of community support meetings attended since last session. The therapist and group members process events that went well and any challenges to recovery. The therapist facilitates group processing primarily around stages of change, post-acute withdrawal, and how many people who are unable to forgive people who have done them wrong in the past are in actuality unable to forgive themselves for having allowed these people to treat them this way. The therapist talks about how people focus on maintaining relationships with others while needing to work more on taking care of the relationship they have with themselves. For example, the therapist makes the observation that if a person lies or steals but gets away with it that the consequence of this can be the person not liking what he or she sees when looking in the mirror. The therapist also talks about people in recovery wanting to not relapse; however, if they do, then relapses can be an opportunity for learning.  ? ? ?Group Time: 11:30 a.m. to 12 noon ? ?Type of Therapy: Psychoeducation and Process ? ?Topic: The therapist continues with the [place] module on Irrational Beliefs. The therapist reads the remaining irrational statements in the module, again, having the group members explain why the statement is irrational. The therapist informs them that they will move on to how to dispute these irrational thoughts at the next group.  ? ?Summary: Kimberly [name] presents for her initial group. She reports that her depression is a ?3? and anxiety is a ?4? while denying any SI or HI. She has attended 87 AA meetings since she was discharged from [facility] and has a Publishing copy and a home group. She is  about to start writing her first step and admits that she has some fear about eventually working her fourth step. ? ?She introduces herself to the other group member saying that she had a ?moment of clarity? after she was hospitalized for malnutrition as she was drinking alcohol while not eating much food. She says that her husband says that they argued about her drinking which she says that she does not remember as she was likely in a blackout. She says that her hair has been falling out but that she is starting to group some new hair.  ? ?During group, she eats from two bags of candy and drinks a Dr. Malachi Bonds and eats a honeybun noting that she has been craving sugar and had some cravings to drink. The cravings for both are normalized as part of PAWS. She says that she recently went to a gas station and had to leave quickly as they new sell Fireballs at gas stations which was what she drank. She says that the smell of cinnamon is a trigger for her as it smells like Fireballs.  ? ?She talks about how she is now a better parent sober as she has not yelled at her 38-year-old twins. She says that she has basically been ?drunk? since they were born. While at [facility], her husband did not do the family program giving the excuse that he had too much work to do at the farm. Kimberly [name] says that he does not understand addiction and told his stepdaughter that he could have divorced Kimberly [name] for less than he  spent for her to go to treatment. He also reportedly said that she was on a ?twenty-eight-day vacation? as she was ?not able to deal with the shit at home.? She says that her husband's lack of participation at family day and his comments hurt her feelings and says that this situation is likely to come to a head in the future at which time she will likely move out and move in with her family.  ? ?When the other group member talks about how he fills out the hourly schedule that he got in group, Kimberly [name] takes one noting  that she too is working on staying occupied and not being alone.  ? ?Kimberly [name] is very active in the exercise on irrational beliefs during which the therapist introduces group members to the concept of learned helplessness.  ? ? ?Progress Towards Goals: She has reportedly not drunk since 12/30/21, is working on attending 90 in 57, and has a Publishing copy.   ? ?UDS collected: Yes Results: No ? ?AA/NA attended?: Yes ? ?Sponsor?: Yes ? ? ? ?Adam Phenix, MA, LCSW, [facility], LCAS ?02/02/2022 ?

## 2022-02-03 ENCOUNTER — Encounter (HOSPITAL_COMMUNITY): Payer: Self-pay | Admitting: Licensed Clinical Social Worker

## 2022-02-03 NOTE — Addendum Note (Signed)
Addended by: Dara Hoyer on: 02/03/2022 05:03 PM ? ? Modules accepted: Level of Service ? ?
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
## 2022-02-04 ENCOUNTER — Other Ambulatory Visit (HOSPITAL_COMMUNITY): Payer: BC Managed Care – PPO | Admitting: Licensed Clinical Social Worker

## 2022-02-04 DIAGNOSIS — G621 Alcoholic polyneuropathy: Secondary | ICD-10-CM | POA: Diagnosis not present

## 2022-02-04 DIAGNOSIS — F1210 Cannabis abuse, uncomplicated: Secondary | ICD-10-CM | POA: Diagnosis not present

## 2022-02-04 DIAGNOSIS — F1310 Sedative, hypnotic or anxiolytic abuse, uncomplicated: Secondary | ICD-10-CM | POA: Diagnosis not present

## 2022-02-04 DIAGNOSIS — F1020 Alcohol dependence, uncomplicated: Secondary | ICD-10-CM | POA: Diagnosis not present

## 2022-02-04 DIAGNOSIS — F419 Anxiety disorder, unspecified: Secondary | ICD-10-CM | POA: Diagnosis not present

## 2022-02-04 DIAGNOSIS — Z79899 Other long term (current) drug therapy: Secondary | ICD-10-CM | POA: Diagnosis not present

## 2022-02-04 DIAGNOSIS — F10288 Alcohol dependence with other alcohol-induced disorder: Secondary | ICD-10-CM | POA: Diagnosis not present

## 2022-02-04 NOTE — Progress Notes (Signed)
Daily Group Progress Note ? ?Program: CD IOP ? ? ?Group Time: 9 a.m. to 12 p.m.  ? ?Type of Therapy: Process ? ?Topic: The therapist checks in with group members, assesses for SI/HI/psychosis and overall level of functioning. The therapist inquires about sobriety date and number of community support meetings attended since last session. The therapist and group members process events that went well and any challenges to recovery. The therapist facilitates group processing around a number of topics including the differences between passive, passive-aggressive, assertive, and aggressive interactions modeling some of these for group members. The therapist discusses what health boundaries and the difference between an enmeshed family and a disengaged family. He introduces group to how to do a transactional analysis of an interpersonal interaction noting that adult to adult interactions are desired while parent-child or child-parent actions are not. He talks about the importance of self-care in recovery and the concept of ?switching addictions? in addition to explaining what is meant by ?process addictions.?  ? ? ?Summary: Kimberly [name] presents for group rating her depression and anxiety both as a ?3? on a scale of 1-10 with 10 being most severe. She denies any SI or HI. She says that she attended three [place] meetings yesterday. After talking to her Sponsor, she did a marriage inventory which her Sponsor told her to do and burn; however, Kimberly [name] asks the therapist to make a copy of it as she says that she will likely need it when addressing her marital problems in the future. She says that her Sponsor was agreeable to her holding onto a copy.  ? ?In relation to her marriage, Kimberly [name] says that she was good at ?sweeping things under the rug? which she concludes ?sucked.? She says that her sponsor told her to do step one on her own. Kimberly [name] spoke to someone else about the fact that she was upset that she was not getting enough  attention from her sponsor and the person advised Kimberly [name] to make her sponsor aware of this. Kimberly [name] spoke to her sponsor and her sponsor will now be doing the first step with her in person. Kimberly [name] concludes that she ?just? has ?to talk to people.? She says that she has not had any cravings lately to use alcohol. She talks about missing some of the women who she met in recovery but says that about eight of them will send text messages to each other. Kimberly [name] says that in mid-April she will start having to make [facility] to [facility]. She says that when she left the residential program that her counselor wanted her to go to [facility] due to her home environment, but Kimberly [name] opted to come home. She opens up about her difficulties with her mother-in-law who lives close to their house. She says that her mother-in-law is eventually going to throw her addiction and the fact that Kimberly [name] had to go to recovery in her face. Kimberly [name] shares how she would like to respond to this and with the therapist assistance realizes that the response would be passive aggressive as opposed to assertive. The therapist suggests that Kimberly [name]'s mother-in-law sounds like she has a tendency of overstepping boundaries and providing unsolicited feedback. The therapist models for Kimberly [name] how she could assertively set limits with her mother-in-law's boundary intrusions. She says that her husband tends to avoid talking about his emotions. When asked what he might say if Kimberly [name] were to inquire as to the state of their marriage, Kimberly [name] says that he would probably focus on the moment and  say something like, ?I think things are good? now. at the same time, she believes that he is going to eventually bring up things from the past apparently much as is mother does with Kimberly [name]. ? ?Kimberly [name] says that what she wants from her husband is for him to learn more about her disease of addiction and attend Al-Anon. It is suggested that he is not likely to  attend a group meeting if he is not comfortable talking about his emotions just as he was not willing to attend the group family meeting when she was in the residential facility. That therapist models how she might go about inviting him to come in to meet with him. Kimberly [name] says that she would like to schedule an individual appointment with this therapist prior to doing so and will see this therapist on Monday, April 3rd immediately after the completion of group. ? ? ?Progress Towards Goals: Kimberly [name] reports continued sobriety and is becoming more assertive in expressing her needs and good insight in recognizing that she needs to get ahead of problems at home before they adversely impact her recovery.  ? ?UDS collected: No Results: No ? ?AA/NA attended?: Yes ? ?Sponsor?: Yes ? ? ?Adam Phenix, MA, LCSW, [facility], LCAS ?02/04/2022 ? ?

## 2022-02-07 ENCOUNTER — Other Ambulatory Visit (HOSPITAL_COMMUNITY): Payer: BC Managed Care – PPO | Attending: Psychiatry | Admitting: Licensed Clinical Social Worker

## 2022-02-07 DIAGNOSIS — Z79899 Other long term (current) drug therapy: Secondary | ICD-10-CM | POA: Diagnosis not present

## 2022-02-07 DIAGNOSIS — E43 Unspecified severe protein-calorie malnutrition: Secondary | ICD-10-CM | POA: Insufficient documentation

## 2022-02-07 DIAGNOSIS — G621 Alcoholic polyneuropathy: Secondary | ICD-10-CM | POA: Insufficient documentation

## 2022-02-07 DIAGNOSIS — F419 Anxiety disorder, unspecified: Secondary | ICD-10-CM | POA: Diagnosis not present

## 2022-02-07 DIAGNOSIS — R451 Restlessness and agitation: Secondary | ICD-10-CM | POA: Diagnosis not present

## 2022-02-07 DIAGNOSIS — Z8669 Personal history of other diseases of the nervous system and sense organs: Secondary | ICD-10-CM | POA: Diagnosis not present

## 2022-02-07 DIAGNOSIS — F1020 Alcohol dependence, uncomplicated: Secondary | ICD-10-CM | POA: Diagnosis not present

## 2022-02-07 DIAGNOSIS — F1210 Cannabis abuse, uncomplicated: Secondary | ICD-10-CM | POA: Diagnosis not present

## 2022-02-07 DIAGNOSIS — F17219 Nicotine dependence, cigarettes, with unspecified nicotine-induced disorders: Secondary | ICD-10-CM | POA: Diagnosis not present

## 2022-02-07 DIAGNOSIS — F4312 Post-traumatic stress disorder, chronic: Secondary | ICD-10-CM | POA: Insufficient documentation

## 2022-02-07 DIAGNOSIS — F1120 Opioid dependence, uncomplicated: Secondary | ICD-10-CM | POA: Diagnosis not present
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
## 2022-02-09 ENCOUNTER — Other Ambulatory Visit (HOSPITAL_COMMUNITY): Payer: BC Managed Care – PPO

## 2022-02-09 ENCOUNTER — Encounter: Payer: PRIVATE HEALTH INSURANCE | Admitting: Neurology

## 2022-02-11 ENCOUNTER — Other Ambulatory Visit (HOSPITAL_COMMUNITY): Payer: BC Managed Care – PPO | Admitting: Licensed Clinical Social Worker

## 2022-02-11 DIAGNOSIS — F419 Anxiety disorder, unspecified: Secondary | ICD-10-CM | POA: Diagnosis not present

## 2022-02-11 DIAGNOSIS — F1020 Alcohol dependence, uncomplicated: Secondary | ICD-10-CM

## 2022-02-11 DIAGNOSIS — Z79899 Other long term (current) drug therapy: Secondary | ICD-10-CM | POA: Diagnosis not present

## 2022-02-11 DIAGNOSIS — F1210 Cannabis abuse, uncomplicated: Secondary | ICD-10-CM | POA: Diagnosis not present

## 2022-02-11 DIAGNOSIS — R451 Restlessness and agitation: Secondary | ICD-10-CM | POA: Diagnosis not present

## 2022-02-11 DIAGNOSIS — E43 Unspecified severe protein-calorie malnutrition: Secondary | ICD-10-CM | POA: Diagnosis not present

## 2022-02-11 DIAGNOSIS — G621 Alcoholic polyneuropathy: Secondary | ICD-10-CM | POA: Diagnosis not present

## 2022-02-11 DIAGNOSIS — F4312 Post-traumatic stress disorder, chronic: Secondary | ICD-10-CM | POA: Diagnosis not present

## 2022-02-11 DIAGNOSIS — Z8669 Personal history of other diseases of the nervous system and sense organs: Secondary | ICD-10-CM | POA: Diagnosis not present

## 2022-02-11 DIAGNOSIS — F17219 Nicotine dependence, cigarettes, with unspecified nicotine-induced disorders: Secondary | ICD-10-CM | POA: Diagnosis not present

## 2022-02-11 NOTE — Progress Notes (Signed)
Daily Group Progress Note ? ?Program: CD IOP ? ? ?Group Time: 9 a.m. to 12 p.m.  ? ?Type of Therapy: Process ? ?Topic: The therapist checks in with group members, assesses for SI/HI/psychosis and overall level of functioning. The therapist inquires about sobriety date and number of community support meetings attended since last session. The therapist and group members process events that went well and any challenges to recovery. The therapist normalizes struggles related to spirituality suggesting that it is especially difficult when things in one's life are not going well and that this is a lifelong process. The therapist shares a NA daily reading entitled, ?The Value of the Past? in relation to the 4th step suggesting how things about which persons in recovery feel the most shame can actually end up being beneficial in helping a newcomer down the road. The therapist notes that when a person in recovery decides to tell his or her story that the person does not need to go into specific detail regarding personal matters. The therapist also points out that people are not defined by what they do in their disease. As the group members share a number of recovery-related events such as a cookout at [facility] next Saturday, the therapist informs group members that they are free to write these events on the large chalk board in the group room so that new group members will be aware of them as well. Towards the end of group, the therapist focuses on group members concerns about what damage they may have caused to their bodies via their alcohol use and a discussion on ways to eat a healthy diet which foods that are a natural source of thiamine.  ? ? ?Summary: Kimberly [name] presents today for group reporting that her depression is a ?2? and her anxiety is a ?3? with no SI or HI. She says that the nerve conductivity test she was supposed to take got rescheduled while she was in treatment; however, she was not aware of this. She  says that planning to not be in group on 02/09/22 worked out for her as one of her children was sick with a fever. She says that she finished her First Step with her Sponsor and told her aunt that she will need $15 an hour; however, did not receive clarification about whether her aunt was going to give her a 1099 now that she will be getting $15 an hour. She is encouraged to clarify this with her aunt and to make it clear that if a 1099 is involved that she will be requiring $20 per hour. Kimberly [name] talks about her fear of doing her 4th Step and a fear about telling her story. She worries that if she tells her story and talks about the extramarital affair that someone in the meeting with break her anonymity and it will get back to her husband. The other group member and therapist suggest that Kimberly [name] can tell her story and reference how it perhaps negatively impacted her marriage without necessarily having to go into detail about having an affair if not comfortable with this. Also, the other group member reminds Kimberly [name] that both her 4th Step and telling her story are things down-the-road which she does not have to deal with in the here-and-now. The other group member asks if Kimberly [name] made progress in convincing her husband to attend Al-Anon. Kimberly [name] says that she has stopped asking but did talk to him about coming in to talk with this therapist to which he seemed to  agree. The therapist asks what Kimberly [name] hopes her husband would be able to take out of such a meeting. Kimberly [name] indicates that she would like for her husband to understand addiction as a disease vs. a character defect. During the latter part of the group, the therapist shows PET scans of brains of persons in addiction and non-addicts illustrating how the disease of addiction impacts the brain. Kimberly [name] talks about how terribly vitamin-D deficient that she was and asks about the long-term impact of her drinking in relation to the risk of things like dementia  with the therapist stating his opinion that Kimberly [name] fortunately engaged in recovery before developing things like cirrhosis of the liver, pancreatitis, Wernicke, Korsakoff syndrome, etc. and talks to her about how to eat a diet rich in thiamine.  ? ? ?Progress Towards Goals: Kimberly [name] reports continued sobriety which is confirmed by her most recent UDS.  ? ?UDS collected: No Results: Yes; most recent UDS was negative for alcohol ? ?AA/NA attended?: Yes ? ?Sponsor?: Yes ? ? ?Adam Phenix, MA, LCSW, [facility], LCAS ?02/11/2022 ?

## 2022-02-14 ENCOUNTER — Other Ambulatory Visit (HOSPITAL_COMMUNITY): Payer: BC Managed Care – PPO | Admitting: Licensed Clinical Social Worker

## 2022-02-14 ENCOUNTER — Encounter (HOSPITAL_COMMUNITY): Payer: Self-pay | Admitting: Licensed Clinical Social Worker

## 2022-02-14 ENCOUNTER — Other Ambulatory Visit (HOSPITAL_COMMUNITY): Payer: Self-pay | Admitting: Medical

## 2022-02-14 DIAGNOSIS — F1210 Cannabis abuse, uncomplicated: Secondary | ICD-10-CM

## 2022-02-14 DIAGNOSIS — F4312 Post-traumatic stress disorder, chronic: Secondary | ICD-10-CM

## 2022-02-14 DIAGNOSIS — F1020 Alcohol dependence, uncomplicated: Secondary | ICD-10-CM | POA: Diagnosis not present

## 2022-02-14 DIAGNOSIS — E43 Unspecified severe protein-calorie malnutrition: Secondary | ICD-10-CM | POA: Diagnosis not present

## 2022-02-14 DIAGNOSIS — R451 Restlessness and agitation: Secondary | ICD-10-CM | POA: Diagnosis not present

## 2022-02-14 DIAGNOSIS — F419 Anxiety disorder, unspecified: Secondary | ICD-10-CM | POA: Diagnosis not present

## 2022-02-14 DIAGNOSIS — Z8669 Personal history of other diseases of the nervous system and sense organs: Secondary | ICD-10-CM | POA: Diagnosis not present

## 2022-02-14 DIAGNOSIS — F1320 Sedative, hypnotic or anxiolytic dependence, uncomplicated: Secondary | ICD-10-CM

## 2022-02-14 DIAGNOSIS — Z79899 Other long term (current) drug therapy: Secondary | ICD-10-CM | POA: Diagnosis not present

## 2022-02-14 DIAGNOSIS — F17219 Nicotine dependence, cigarettes, with unspecified nicotine-induced disorders: Secondary | ICD-10-CM

## 2022-02-14 DIAGNOSIS — G621 Alcoholic polyneuropathy: Secondary | ICD-10-CM

## 2022-02-14 DIAGNOSIS — Z8659 Personal history of other mental and behavioral disorders: Secondary | ICD-10-CM

## 2022-02-14 MED ORDER — BACLOFEN 10 MG PO TABS: 10.0000 mg | ORAL_TABLET | Freq: Three times a day (TID) | ORAL | 1 refills | Status: DC

## 2022-02-14 NOTE — Progress Notes (Signed)
Patient ID: Kimberly [name], female   DOB: 1986-09-05, 36 y.o.   MRN: [id number] ? ?

## 2022-02-14 NOTE — Progress Notes (Signed)
Daily Group Progress Note ? ?Program: CD IOP ? ? ?Group Time: 9 a.m. to 12 p.m.  ? ?Type of Therapy: Process ? ?Topic: The therapist introduces two new members to group today while working to establish good group rapport. The therapist checks in with group members, assesses for SI/HI/psychosis and overall level of functioning. The therapist inquires about sobriety date and number of community support meetings attended since last session. The therapist and group members process events that went well and any challenges to recovery. In relation to recent alcohol use by both the new group members, the therapist facilitates group discussion on addiction as an inheritable, brain-based disease and how age of first use is an important factor. The therapist also leads a group discussion on the reason that Twelve Step attendance is important and the concept of avoiding people, places and things. The therapist also discusses what constitutes "normal drinking" vs. "Binge drinking" in addiction to explaining the serious, potentially life-threatening risks involved in withdrawing abruptly from alcohol and benzodiazepines. ? ?Summary: Alle presents today for group reporting that her depression is a ?1? and her anxiety is a ?2? which is down from a "2" and "3" respectively at last group. She reports no SI or HI. She says that she sat with her aunt and that things went well and that she is getting the $15 an hour. She says that as the aunt is not keeping up with Skylen's hours that she is not likely to be getting a 1099. She says that she spoke to her husband about attending a conjoint session and he agreed to attend provided that it was later in the afternoon which she notes is odd for him. She schedules an appointment for them to come in this Wednesday at 3 p.m. saying that she will see if he comes up with an excuse not to attend. She continues to attend meetings and work steps with her Sponsor. During the discussion regarding  what drinking looks like on a national level, Antwonette like everyone else in group overly estimates the percentage of people who drink. In talking about how old people were when they first started drinking, Analyssa shares that she was 36 years old and likely younger when she first drank alcohol. She is very active in welcoming the newcomers to the meeting and challenges both of them at some point questioning if one is on solid enough group to attend a baby shower with drinking and whether another's wife's wine drinking might impact his recovery. Hinda admits that she is not at the point that she can be around drinking at all.  ? ?Progress Towards Goals: Conni reports continued sobriety which is confirmed by her most recent UDS.  ? ?UDS collected: Yes Results: No ? ?AA/NA attended?: Yes ? ?Sponsor?: Yes ? ? ?Adam Phenix, MA, LCSW, [facility], LCAS ?02/14/2022 ?

## 2022-02-14 NOTE — Progress Notes (Addendum)
? ?  [place] ?Follow-up Outpatient CDIOP ?Date: 02/14/2022 ? ?Admission Date:02/02/2022 ? ?Sobriety date:12/29/2021 ? ?Subjective: "45 days today!" ? ?HPI : CD IOP Provider FU ? ?Review of Systems: ?Psychiatric: ?Agitation: Cravings and anxiety ?Hallucination: No ?Depressed Mood: PHQ score 4 ?Insomnia: Rx Trazodone ?Hypersomnia: No ?Altered Concentration: No ?Feels Worthless: + PHQ 9 ?Grandiose Ideas: No ?Belief In Special Powers: No ?New/Increased Substance Abuse: No ?Compulsions: Ongoing cravings on Naltrexone ? ?Neurologic: ?Headache: No ?Seizure: No ?Paresthesias: No ? ?Current Medications: ?baclofen 10 MG tablet ?Commonly known as: LIORESAL Take 1 tablet (10 mg total) by mouth 3 (three) times daily.  ?busPIRone 30 MG tablet ?Commonly known as: BUSPAR Take 60 mg by mouth daily.  ?CertaVite/Antioxidants Tabs Take 1 tablet by mouth daily.  ?D3-1000 25 MCG (1000 UT) capsule ?Generic drug: Cholecalciferol Take 1,000 Units by mouth every morning.  ?escitalopram 20 MG tablet ?Commonly known as: LEXAPRO Take 20 mg by mouth every morning.  ?famotidine 20 MG tablet ?Commonly known as: PEPCID Take 1 tablet (20 mg total) by mouth daily.  ?fluticasone 50 MCG/ACT nasal spray ?Commonly known as: FLONASE Place 1 spray into both nostrils every morning.  ?folic acid 1 MG tablet ?Commonly known as: FOLVITE Take 1 tablet (1 mg total) by mouth daily.  ?hydrocortisone cream 1 % ?Commonly known as: Preparation H Apply 1 application topically as needed for itching.  ?ibuprofen 200 MG tablet ?Commonly known as: ADVIL Take 800 mg by mouth every 6 (six) hours as needed for mild pain or moderate pain.  ?methylPREDNISolone 4 MG Tbpk tablet ?Commonly known as: MEDROL DOSEPAK Take by mouth.  ?omeprazole 40 MG capsule ?Commonly known as: PRILOSEC Take 40 mg by mouth every morning.  ?pantoprazole 40 MG tablet ?Commonly known as: Protonix Take 1 tablet (40 mg total) by mouth daily.  ?pregabalin 200 MG capsule ?Commonly known as:  Lyrica Take 1 capsule (200 mg total) by mouth 3 (three) times daily.  ?ThermaCare Back/Hip Misc A ONE PATCH DAILY AS NEEDED FOR BACK PAIN ON FOR EIGHT HOURS AND OFF FOR 16 HOURS  ?thiamine 100 MG tablet Take 1 tablet (100 mg total) by mouth daily.  ?traZODone 50 MG tablet ?Commonly known as: DESYREL Take 50 mg by mouth at bedtime as needed.  ? ? ?Mental Status Examination  ?Appearance:Wellgroomed ?Alert: Yes ?Attention: good  ?Cooperative: Yes ?Eye Contact: Good ?Speech: Clear and coherent ?Psychomotor Activity: Normal ?Memory/Concentration: Normal/intact ?Oriented: person, place, time/date and situation ?Mood: Euthymic ?Affect: Appropriate and Congruent ?Thought Processes and Associations: Coherent and Intact ?Fund of Knowledge: WDL ?Thought Content: WDL ?Insight:Limited ?Judgement: Impaired ? ?UDS:3/29 Rx meds NO Xanax ? ?PDMP: 120 0.5 Xanax 01/27/2022 ? ?Diagnosis:  ?Alcohol use disorder, severe, dependence ([place]) ?Xanax use disorder, moderate ([place]) ?Cigarette nicotine dependence with nicotine-induced disorder ?Cannabis abuse, episodic use ?Chronic post-traumatic stress disorder (PTSD) ?Severe protein-calorie malnutrition ([place]) ?Polyneuropathy, alcoholic ([place]) ?History of Guillain-Barre syndrome ?History of attention deficit disorder ?Anxiety disorder, unspecified type ? ?Assessment:Early recovery /remission-mamaging symptoms ? ?Treatment Plan:Per admission ?                             Add MAT Baclofen ?                             FU 2 weeks ?Darlyne Russian, PA-C Patient ID: [name], female   DOB: 11-Dec-1985, 36 y.o.   MRN: [id number] ? ?

## 2022-02-14 NOTE — Addendum Note (Signed)
Addended by: Dara Hoyer on: 02/14/2022 05:23 PM ? ? Modules accepted: Level of Service ? ?

## 2022-02-16 ENCOUNTER — Other Ambulatory Visit (HOSPITAL_COMMUNITY): Payer: BC Managed Care – PPO | Admitting: Licensed Clinical Social Worker

## 2022-02-16 DIAGNOSIS — F419 Anxiety disorder, unspecified: Secondary | ICD-10-CM | POA: Diagnosis not present

## 2022-02-16 DIAGNOSIS — F17219 Nicotine dependence, cigarettes, with unspecified nicotine-induced disorders: Secondary | ICD-10-CM | POA: Diagnosis not present

## 2022-02-16 DIAGNOSIS — Z79899 Other long term (current) drug therapy: Secondary | ICD-10-CM | POA: Diagnosis not present

## 2022-02-16 DIAGNOSIS — R451 Restlessness and agitation: Secondary | ICD-10-CM | POA: Diagnosis not present

## 2022-02-16 DIAGNOSIS — Z8669 Personal history of other diseases of the nervous system and sense organs: Secondary | ICD-10-CM | POA: Diagnosis not present

## 2022-02-16 DIAGNOSIS — F1210 Cannabis abuse, uncomplicated: Secondary | ICD-10-CM | POA: Diagnosis not present

## 2022-02-16 DIAGNOSIS — F4312 Post-traumatic stress disorder, chronic: Secondary | ICD-10-CM | POA: Diagnosis not present

## 2022-02-16 DIAGNOSIS — F1020 Alcohol dependence, uncomplicated: Secondary | ICD-10-CM | POA: Diagnosis not present

## 2022-02-16 DIAGNOSIS — G621 Alcoholic polyneuropathy: Secondary | ICD-10-CM | POA: Diagnosis not present

## 2022-02-16 DIAGNOSIS — E43 Unspecified severe protein-calorie malnutrition: Secondary | ICD-10-CM | POA: Diagnosis not present

## 2022-02-16 NOTE — Progress Notes (Signed)
?Daily Group Progress Note ? ?Program: [facility] IOP ? ? ?Group Time: 9:35 a.m. to 12 p.m.  ? ?Type of Therapy: Process and Psychoeducational ? ?Topic: The therapist checks in with group members, assesses for SI/HI/psychosis and overall level of functioning. The therapist inquires about sobriety date and number of community support meetings attended since last session. The therapist and group members process events that went well and any challenges to recovery. The therapist facilitates group processing around a number of topics including the brain-based reason that many alcoholics have a strong sugar craving in early recovery and how a person can go about picking a sponsor and how female members can avoid predators. The therapist shows a short video related to sugar cravings and some theories regarding how lower levels of Serotonin may be a factor related to alcohol dependence. The therapist educates members on alcohol withdrawal and the fact that it will become progressively worse over time and the reason that people can have alcohol-withdrawal-related seizures. The therapist uses one group member's recent relapse to illustrate how a relapse can be an opportunity for learning discussing how to remove triggers and avoid all or nothing thinking. The therapist emphasizes that research shows that ?recovery takes place in fellowship? and that it is not something that a person can do by himself. He discusses the reason that establishing a routine and being responsible about showing up on time, etc. are important elements of recovery. The therapist shares the daily NA reading entitled, ?The Big Picture? facilitating a discussion related to eliminating self-defeating self-talk and humility in accepting that one needs help from others.  ? ? ?Summary: Kimberly [name] presents for group rating her depression as a ?2? and anxiety as a ?5? with no SI or HI. In response to another group member's having recently relapse, Kimberly [name] encourages  this member to write down her number letting her know that she can call Kimberly [name] in the future if she is thinking of drinking. Kimberly [name] encourages new group members who have never attended Twelve Step meetings to attend. During today's group, Kimberly [name] talks about her own struggles with ADHD as a child noting that she was likely on too much ADHD medication such that it made her like a zombie. She shares about how she had to go against her husband in making the decision to hold one of their twins back a grade due to his ADHD and to get him on medication. In talking about all or nothing thinking, Kimberly [name] admits that she was so upset at herself for missing the exit and running late for group that she almost did not show at all. She also says that she always believed that if she ever were to relapse that she would go all out to ?make it count;? however, as a result of today's discussion, realizes that she would not have to go all out but could still do things such as call someone after the first drink. She admits that she still cannot be alone with herself which is likely due to engaging in a lot of negative self-talk about herself. She says that her husband is supposed to attend the conjoint session this afternoon. When asked at the conclusion of group what she can take away from today's group, she says that her take away is that she needs ?another meeting.?  ? ? ?Progress Towards Goals: Kimberly [name] reports continued sobriety and AA attendance. Her anxiety is higher today than the last group.  ? ?UDS collected: No Results: No ? ?AA/NA attended?: Yes ? ?  Sponsor?: Yes ? ? ?Program: [facility] IOP ? ? ?Family session: 3 p.m. to 4:30 p.m. ? ?The therapist meets with Kimberly [name] and her husband. Her husband talks about his failed attempts at trying to stop Kimberly [name]'s drinking noting that it was upsetting to him; however, that he eventually just stopped trying and kept it to himself. He tears up several times in discussing this. Kimberly [name] says  that had he told her how badly it was affecting him at the time that it would not have mattered and she would not have stopped as she told him that she would not stop until she was ready. ? ?The therapist explains to her husband what Al-Anon is and how it could have helped him to get support when he was dealing with Kimberly [name]'s drinking. Kimberly [name] talks about how possessive one of her son's seems to be of her attention which leads to a discussion of how addiction can change relationships in the family such as causing children to act in almost a parental role to the parent or parents who are actively in addiction. ? ?The therapist observes that Kimberly [name] is making some you statements about what her husband thinks and instead encourages her to use I statements modeling for her how she might do this. Per her request and with her husband's permission, the therapist explains to him what his meant by addiction being a brain-based disease vs. character defect. Kimberly [name] says that she would like links to resources that show brain imaging, etc. To support this to show other family members.  ? ?Much of the discussion involves Kimberly [name]'s husband's apparent lack of self-care in that he is working on the farm upwards of 80 hours a week and is unable to do things with Kimberly [name] or take vacations as a result of this. He also notes that he is consuming an excessive amount of caffeinated soft drinks per day which has apparently caused some possibly stomach issues.  ? ?The therapist suggests that they may need to work on finding someone whom he trusts to manage the farm for short periods when he is away such that he can take a break and they can have some social activity while validating that finding such a person is a challenge. ? ?At the conclusion of the session, her husband says that he may come back admitting that he found this meeting helpful. ? ?Adam Phenix, MA, LCSW, [facility], LCAS ?02/16/2022 ?

## 2022-02-18 ENCOUNTER — Ambulatory Visit (HOSPITAL_COMMUNITY): Payer: BC Managed Care – PPO | Admitting: Licensed Clinical Social Worker

## 2022-02-18 DIAGNOSIS — F17219 Nicotine dependence, cigarettes, with unspecified nicotine-induced disorders: Secondary | ICD-10-CM | POA: Diagnosis not present

## 2022-02-18 DIAGNOSIS — F419 Anxiety disorder, unspecified: Secondary | ICD-10-CM | POA: Diagnosis not present

## 2022-02-18 DIAGNOSIS — F4312 Post-traumatic stress disorder, chronic: Secondary | ICD-10-CM | POA: Diagnosis not present

## 2022-02-18 DIAGNOSIS — R451 Restlessness and agitation: Secondary | ICD-10-CM | POA: Diagnosis not present

## 2022-02-18 DIAGNOSIS — Z8669 Personal history of other diseases of the nervous system and sense organs: Secondary | ICD-10-CM | POA: Diagnosis not present

## 2022-02-18 DIAGNOSIS — Z79899 Other long term (current) drug therapy: Secondary | ICD-10-CM | POA: Diagnosis not present

## 2022-02-18 DIAGNOSIS — E43 Unspecified severe protein-calorie malnutrition: Secondary | ICD-10-CM | POA: Diagnosis not present

## 2022-02-18 DIAGNOSIS — F1210 Cannabis abuse, uncomplicated: Secondary | ICD-10-CM | POA: Diagnosis not present

## 2022-02-18 DIAGNOSIS — F1020 Alcohol dependence, uncomplicated: Secondary | ICD-10-CM | POA: Diagnosis not present

## 2022-02-18 DIAGNOSIS — G621 Alcoholic polyneuropathy: Secondary | ICD-10-CM | POA: Diagnosis not present

## 2022-02-18 NOTE — Progress Notes (Signed)
Daily Group Progress Note ? ?Program: [facility] IOP ? ? ?Group Time: 9 a.m. to 12 p.m.  ? ?Type of Therapy: Process and Psychoeducational  ? ?Topic: The therapist checks in with group members, assesses for SI/HI/psychosis and overall level of functioning. The therapist inquires about sobriety date and number of community support meetings attended since last session. The therapist and group members process events that went well and any challenges to recovery. The therapist facilitates group processing answers group members questions about a number of illicit substances such as what is Kratom, what class of drug are bath salts, etc. The therapist facilitates a group discussion around what a group member should do regarding a wedding to which he was invited in which the groom is a drinking buddy, and the bride is his niece. The therapist suggests that what one person in recovery may not determine to be a high-risk situation may be extremely high risk for another person. The therapist explains what is meant about the saying that if a person is at risk of using that he needs to step over his mother's body, if need be, to get to a meeting. The therapist educates group members on what is meant by the ?rescue fantasy? and why engaging in it is a risk to one's sobriety. The therapist educates group members on what is meant by a person being ?co-dependent? in relation to a person in active addiction who is the dependent. It is noted that people's using buddies may feel threatened by a person's recovery and consciously or unconsciously work to sabotage the person's recovery; however, so may the co-dependent person in his life. The therapist discusses with group how many people in active addiction believe in the myth that they are a better version of themselves when using such as funnier, more creative, etc. when the reverse is true. The therapist makes the observation towards the end of group that group members are engaging in  telling war stories discussing the reason that such stories are discouraged in recovery meetings and how people in active addiction can sometimes use humor as a form of denial to gloss over an event in which they felt guilt or shame over their actions while using.  ? ? ?Summary: Kimberly [name] presents for group rating her depression as a ?2? and anxiety as a ?2? with no SI or HI. She says that she was shaking after the appointment that she attended with her husband with this therapist while at the same time concluding that the session went well and that she was surprised that her husband said that he would be agreeable to attending another session in the future. She says that she has finished her second step with her sponsor. She talks about how she used to spend $20 a day on alcohol not counting money that she also spent on cigarettes. In talking about codependency, she points out that her husband made the comment about having to help her after she got her DWI or that they never would have gotten married. She says that he is very ?controlling? 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 She admits that as far as she is concerned at this point that she wants to no longer be married. After group, the therapist asks her what she has done regarding the man with whom she is was having an affair. Kimberly [name] responds by saying that she has not taken any action on formally  ending this relationship but must do so in the future. She requests do you have an individual session with this therapist and we'll meet with him on Monday, April 17th, after group at 12 noon. ? ? ?Progress Towards Goals: Kimberly [name] reports continued sobriety; however, admits to cravings to drink after her conjoint session with her husband but says that she called supports in her program instead.  ? ?UDS collected: No Results: Yes; negative for ETOH ? ?AA/NA attended?: Yes ? ?Sponsor?: Yes ? ?Adam Phenix, MA, LCSW,  [facility], LCAS ?02/18/2022 ?

## 2022-02-21 ENCOUNTER — Other Ambulatory Visit (HOSPITAL_COMMUNITY): Payer: Self-pay | Admitting: Medical

## 2022-02-21 ENCOUNTER — Other Ambulatory Visit (HOSPITAL_BASED_OUTPATIENT_CLINIC_OR_DEPARTMENT_OTHER): Payer: BC Managed Care – PPO | Admitting: Licensed Clinical Social Worker

## 2022-02-21 ENCOUNTER — Encounter (HOSPITAL_COMMUNITY): Payer: Self-pay | Admitting: Licensed Clinical Social Worker

## 2022-02-21 DIAGNOSIS — F1020 Alcohol dependence, uncomplicated: Secondary | ICD-10-CM | POA: Diagnosis not present

## 2022-02-21 DIAGNOSIS — Z79899 Other long term (current) drug therapy: Secondary | ICD-10-CM | POA: Diagnosis not present

## 2022-02-21 DIAGNOSIS — G621 Alcoholic polyneuropathy: Secondary | ICD-10-CM | POA: Diagnosis not present

## 2022-02-21 DIAGNOSIS — F17219 Nicotine dependence, cigarettes, with unspecified nicotine-induced disorders: Secondary | ICD-10-CM

## 2022-02-21 DIAGNOSIS — E43 Unspecified severe protein-calorie malnutrition: Secondary | ICD-10-CM | POA: Diagnosis not present

## 2022-02-21 DIAGNOSIS — F1210 Cannabis abuse, uncomplicated: Secondary | ICD-10-CM

## 2022-02-21 DIAGNOSIS — Z8669 Personal history of other diseases of the nervous system and sense organs: Secondary | ICD-10-CM

## 2022-02-21 DIAGNOSIS — R451 Restlessness and agitation: Secondary | ICD-10-CM | POA: Diagnosis not present

## 2022-02-21 DIAGNOSIS — F4312 Post-traumatic stress disorder, chronic: Secondary | ICD-10-CM | POA: Diagnosis not present

## 2022-02-21 DIAGNOSIS — F419 Anxiety disorder, unspecified: Secondary | ICD-10-CM | POA: Diagnosis not present

## 2022-02-21 DIAGNOSIS — Z8659 Personal history of other mental and behavioral disorders: Secondary | ICD-10-CM

## 2022-02-21 DIAGNOSIS — F1320 Sedative, hypnotic or anxiolytic dependence, uncomplicated: Secondary | ICD-10-CM

## 2022-02-21 MED ORDER — PANTOPRAZOLE SODIUM 40 MG PO TBEC: 40.0000 mg | DELAYED_RELEASE_TABLET | Freq: Every day | ORAL | 1 refills | Status: DC

## 2022-02-21 MED ORDER — TRAZODONE HCL 50 MG PO TABS: 50.0000 mg | ORAL_TABLET | Freq: Every evening | ORAL | 0 refills | Status: DC | PRN

## 2022-02-21 MED ORDER — ESCITALOPRAM OXALATE 20 MG PO TABS: 20.0000 mg | ORAL_TABLET | Freq: Every morning | ORAL | 2 refills | Status: DC

## 2022-02-21 NOTE — Progress Notes (Incomplete Revision)
Daily Group Progress Note ? ?Program: [facility] IOP ? ? ?Group Time: 9 a.m. to 12 p.m.  ? ?Type of Therapy: Process and Psychoeducational  ? ?Topic: The therapist checks in with group members, assesses for SI/HI/psychosis and overall level of functioning. The therapist inquires about sobriety date and number of community support meetings attended since last session. The therapist and group members process events that went well and any challenges to recovery. The therapist facilitates group processing around a number of topics such as why people are encouraged to attend ?35 in 43,? what is meant by developing an aftercare plan, what a resentment chip is and the reason that resentments are a threat to one's recovery, what domains are used in ASAM placement criteria and why one's recovery environment is an important consideration, how boredom is a an emotion that can lead to relapse, and what are the principles of acceptance and commitment therapy and how they can be incorporated into one's recovery action plan. The therapist reviews the NA Just for Today reading for today which emphasizes the importance of scheduling everything around one's recovery meetings versus the other way around. The therapist educates members on long-term treatment programs beyond 28 days such as [facility] and introduces group members to the feeling wheel and how learning to feel and identify one's feelings and sit with uncomfortable feelings is a big part of recovery. The therapist covers module ERS 8 ?12-Step Sayings? and RP1, ?Alcohol? from the Matrix recovery manual.  ? ? ?Summary: Valita presents for group rating her depression as a ?3? and anxiety as a ?5? with no SI or HI. She initially says that she has no hobbies; however, she later recognizes that she likes hunting. She reports that she continues to attend a meeting per day and sometimes two meetings per day and explains to the new group members what attending ?ninety in 8? means  and the reason that it is important. She notes that where she feels best is at a meeting. She talks about the different types of meetings. In answering the question posed in module ERS 8 about how often she finds herself in one or more of ?these emotional states? i.e. hungry, angry, lonely, or tired; Wandy responds, ?twenty-three-and-a-half? hours out of the day.  ? ? ?Progress Towards Goals: She reports continued sobriety and continues to attend Twelve Step meetings but is ambivalent regarding what to do about her marriage.  ? ?UDS collected: Yes  Results: No ? ?AA/NA attended?: Yes ? ?Sponsor?: Yes ? ?Individual Therapy Time: 12:10 p.m. to 01:10 p.m.  ? ?The therapist meets with Manaia today for an individual session per her request. The major focus of the session involves Arla's ambivalence regarding what to do about her marriage. She says that her Sponsor gave her a homework assignment of asking her husband why Dequita is important to him. Rozalyn says that her Sponsor apparently believes that [place] husband will say something positive that will draw them closer together as her Sponsor believes that things will work out between them in the end. ? ?Priscila has not completed this assignment as she would likely be asked the same of him with her answer at this point being that he provides an income and keeps a roof over their heads. ? ?She says that her husband does not open up about his feelings as he did when they were dating and often does not even acknowledge that she is in the house with him after they have gone extended periods without  seeing each other. She recognizes that what she was getting from the man with whom she had the affair is what she wants from her husband.  ? ?Part of her wants what she had with her husband when they were dating; however, another part of her wants to be on her own an independent as she went from living with her parents to relationships never knowing what it is like to  be single. At the same time, she is terrified of being alone having never been on her own. ? ?She says that after completing IOP that she would like to go back to school for phlebotomy. She recognizes that she needs to break things off completely with the other guy as she sees no future with him even if she were to split with her husband and has even encouraged him to work things out with his wife. She has delayed ending things as she is a nice guy. 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 Whenever she has tried to find out why he has the property in her name, he will get angry and end the conversation.  ? ?The therapist talks to Ondria about options for addressing her current situation such as marriage therapy; however, Sabella notes that she is not at that place yet with the therapist suggesting that the first thing that will need to happen is for Elwyn to determine what she wants for herself moving forward. The therapist suggests that she might want to put the homework from her Sponsor on hold until she resolves her feelings of ambivalence.  ? ?She requests to be seen again individually in a week.  ? ? ?Adam Phenix, MA, LCSW, [facility], LCAS ?02/21/2022 ? ? ? ?Emanuel ?Follow-up Outpatient [facility]OP ?Date:  ? ?Admission Date: ? ?Sobriety date: ? ?Subjective:  ? ?HPI  ? ?Review of Systems: ?Psychiatric: ?Agitation: No ?Hallucination: No ?Depressed Mood: Yes much better ?Insomnia: No ?Hypersomnia: No ?Altered Concentration: No ?Feels Worthless: No ?Grandiose Ideas: No ?Belief In Special Powers: No ?New/Increased Substance Abuse: No ?Compulsions: No ? ?Neurologic: ?Headache: No ?Seizure: No ?Paresthesias: No ? ?Current Medications: ?baclofen 10 MG tablet ?Commonly known as: LIORESAL Take 1 tablet (10 mg  total) by mouth 3 (three) times daily.  ?busPIRone 30 MG tablet ?Commonly known as: BUSPAR Take 60 mg by mouth daily.  ?CertaVite/Antioxidants Tabs Take 1 tablet by mouth daily.  ?D3-1000 25 MCG (1000 UT) capsule ?Generic drug: Cholecalciferol Take 1,000 Units by mouth every morning.  ?escitalopram 20 MG tablet ?Commonly known as: LEXAPRO Take 1 tablet (20 mg total) by mouth every morning.  ?fluticasone 50 MCG/ACT nasal spray ?Commonly known as: FLONASE Place 1 spray into both nostrils every morning.  ?folic acid 1 MG tablet ?Commonly known as: FOLVITE Take 1 tablet (1 mg total) by mouth daily.  ?hydrocortisone cream 1 % ?Commonly known as: Preparation H Apply 1 application topically as needed for itching.  ?ibuprofen 200 MG tablet ?Commonly known as: ADVIL Take 800 mg by mouth every 6 (six) hours as needed for mild pain or moderate pain.  ?omeprazole 40 MG capsule ?Commonly known as: PRILOSEC Take 40 mg by mouth every morning.  ?pantoprazole 40 MG tablet ?Commonly known as: Protonix Take 1 tablet (40 mg total)  by mouth daily.  ?pregabalin 200 MG capsule ?Commonly known as: Lyrica Take 1 capsule (200 mg total) by mouth 3 (three) times daily.  ?ThermaCare Back/Hip Misc A ONE PATCH DAILY AS NEEDED FOR BACK PAIN ON FOR EIGHT HOURS AND OFF FOR 16 HOURS  ?thiamine 100 MG tablet Take 1 tablet (100 mg total) by mouth daily.  ?traZODone 50 MG tablet ?Commonly known as: DESYREL Take 1 tablet (50 mg total) by mouth at bedtime as needed for sleep.  ? ? ? ?Mental Status Examination  ?Appearance: ?Alert: Yes ?Attention: good  ?Cooperative: Yes ?Eye Contact: Good ?Speech: Clear and coherent ?Psychomotor Activity: Normal ?Memory/Concentration: Normal/intact ?Oriented: person, place, time/date and situation ?Mood: Euthymic ?Affect: Appropriate and Congruent ?Thought Processes and Associations: Coherent and Intact ?Fund of Knowledge: Good ?Thought Content: WDL ?Insight: Good ?Judgement: Good ? ?UDS: ? ?PDMP: ? ?Diagnosis:  ?Alcohol  use disorder, severe, dependence ([place]) ?Xanax use disorder, moderate ([place]) ?Cigarette nicotine dependence with nicotine-induced disorder ?Cannabis abuse, episodic use ?Chronic post-traumatic stress dis

## 2022-02-21 NOTE — Addendum Note (Signed)
Addended by: Dara Hoyer on: 02/21/2022 05:43 PM ? ? Modules accepted: Level of Service ? ?

## 2022-02-21 NOTE — Progress Notes (Addendum)
Daily Group Progress Note ? ?Program: CD IOP ? ? ?Group Time: 9 a.m. to 12 p.m.  ? ?Type of Therapy: Process and Psychoeducational  ? ?Topic: The therapist checks in with group members, assesses for SI/HI/psychosis and overall level of functioning. The therapist inquires about sobriety date and number of community support meetings attended since last session. The therapist and group members process events that went well and any challenges to recovery. The therapist facilitates group processing around a number of topics such as why people are encouraged to attend ?89 in 66,? what is meant by developing an aftercare plan, what a resentment chip is and the reason that resentments are a threat to one's recovery, what domains are used in ASAM placement criteria and why one's recovery environment is an important consideration, how boredom is a an emotion that can lead to relapse, and what are the principles of acceptance and commitment therapy and how they can be incorporated into one's recovery action plan. The therapist reviews the NA Just for Today reading for today which emphasizes the importance of scheduling everything around one's recovery meetings versus the other way around. The therapist educates members on long-term treatment programs beyond 28 days such as [facility] and introduces group members to the feeling wheel and how learning to feel and identify one's feelings and sit with uncomfortable feelings is a big part of recovery. The therapist covers module ERS 8 ?12-Step Sayings? and RP1, ?Alcohol? from the Matrix recovery manual.  ? ? ?Summary: Joely presents for group rating her depression as a ?3? and anxiety as a ?5? with no SI or HI. She initially says that she has no hobbies; however, she later recognizes that she likes hunting. She reports that she continues to attend a meeting per day and sometimes two meetings per day and explains to the new group members what attending ?ninety in 15? means  and the reason that it is important. She notes that where she feels best is at a meeting. She talks about the different types of meetings. In answering the question posed in module ERS 8 about how often she finds herself in one or more of ?these emotional states? i.e. hungry, angry, lonely, or tired; Cora responds, ?twenty-three-and-a-half? hours out of the day.  ? ? ?Progress Towards Goals: She reports continued sobriety and continues to attend Twelve Step meetings but is ambivalent regarding what to do about her marriage.  ? ?UDS collected: Yes  Results: No ? ?AA/NA attended?: Yes ? ?Sponsor?: Yes ? ?Individual Therapy Time: 12:10 p.m. to 01:10 p.m.  ? ?The therapist meets with Sherise today for an individual session per her request. The major focus of the session involves Preslie's ambivalence regarding what to do about her marriage. She says that her Sponsor gave her a homework assignment of asking her husband why Tyashia is important to him. [place] says that her Sponsor apparently believes that [place] husband will say something positive that will draw them closer together as her Sponsor believes that things will work out between them in the end. ? ?Kalena has not completed this assignment as she would likely be asked the same of him with her answer at this point being that he provides an income and keeps a roof over their heads. ? ?She says that her husband does not open up about his feelings as he did when they were dating and often does not even acknowledge that she is in the house with him after they have gone extended periods without  seeing each other. She recognizes that what she was getting from the man with whom she had the affair is what she wants from her husband.  ? ?Part of her wants what she had with her husband when they were dating; however, another part of her wants to be on her own an independent as she went from living with her parents to relationships never knowing what it is like to  be single. At the same time, she is terrified of being alone having never been on her own. ? ?She says that after completing IOP that she would like to go back to school for phlebotomy. She recognizes that she needs to break things off completely with the other guy as she sees no future with him even if she were to split with her husband and has even encouraged him to work things out with his wife. She has delayed ending things as she is a nice guy. 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 Whenever she has tried to find out why he has the property in her name, he will get angry and end the conversation.  ? ?The therapist talks to Kahlyn about options for addressing her current situation such as marriage therapy; however, Malissie notes that she is not at that place yet with the therapist suggesting that the first thing that will need to happen is for Cherrelle to determine what she wants for herself moving forward. The therapist suggests that she might want to put the homework from her Sponsor on hold until she resolves her feelings of ambivalence.  ? ?She requests to be seen again individually in a week.  ? ? ?Adam Phenix, MA, LCSW, [facility], LCAS ?02/21/2022 ? ? ? ?[place] ?Follow-up Outpatient CDIOP ?Date: 02/21/2022 ? ?Admission Date:02/02/2022 ? ?Sobriety date:12/29/2021 ? ?Subjective: "Almost out of Trazodone" ? ?HPI : Pt requesting Trazodone refill. Anxiety still an issue but says she is coping with meds and mindfulness. Seeing Counselor 1:1 As well ? ?Review of Systems: ?Psychiatric: ?Agitation: On going anxiety ?Hallucination: No ?Depressed Mood: Rx Lexapro ?Insomnia: Rx Trazodone ?Hypersomnia: No ?Altered Concentration: No ?Feels Worthless: No ?Grandiose Ideas: No ?Belief In Special Powers:  No ?New/Increased Substance Abuse: No ?Compulsions: Baclofen helps with cravings ? ?Neurologic: ?Headache: No ?Seizure: No ?Paresthesias: No ? ?Current Medications: ?baclofen 10 MG tablet ?Commonly known as: LIORESAL Take 1 tablet (10 mg total) by mouth 3 (three) times daily.  ?busPIRone 30 MG tablet ?Commonly known as: BUSPAR Take 60 mg by mouth daily.  ?CertaVite/Antioxidants Tabs Take 1 tablet by mouth daily.  ?D3-1000 25 MCG (1000 UT) capsule ?Generic drug: Cholecalciferol Take 1,000 Units by mouth every morning.  ?escitalopram 20 MG tablet ?Commonly known as: LEXAPRO Take 1 tablet (20 mg total) by mouth every morning.  ?fluticasone 50 MCG/ACT nasal spray ?Commonly known as: FLONASE Place 1 spray into both nostrils every morning.  ?folic acid 1 MG tablet ?Commonly known as: FOLVITE Take 1 tablet (1 mg total) by mouth daily.  ?hydrocortisone cream 1 % ?Commonly known as: Preparation H Apply 1 application topically as needed for itching.  ?ibuprofen 200 MG tablet ?Commonly known as: ADVIL Take 800 mg by mouth every 6 (six) hours as needed for mild pain or moderate pain.  ?  omeprazole 40 MG capsule ?Commonly known as: PRILOSEC Take 40 mg by mouth every morning.  ?pantoprazole 40 MG tablet ?Commonly known as: Protonix Take 1 tablet (40 mg total) by mouth daily.  ?pregabalin 200 MG capsule ?Commonly known as: Lyrica Take 1 capsule (200 mg total) by mouth 3 (three) times daily.  ?ThermaCare Back/Hip Misc A ONE PATCH DAILY AS NEEDED FOR BACK PAIN ON FOR EIGHT HOURS AND OFF FOR 16 HOURS  ?thiamine 100 MG tablet Take 1 tablet (100 mg total) by mouth daily.  ?traZODone 50 MG tablet ?Commonly known as: DESYREL Take 1 tablet (50 mg total) by mouth at bedtime as needed for sleep.  ? ? ? ?Mental Status Examination  ?Appearance: ?Alert: Yes ?Attention: good  ?Cooperative: Yes ?Eye Contact: Good ?Speech: Clear and coherent ?Psychomotor Activity: Normal ?Memory/Concentration: Normal/intact ?Oriented: person, place, time/date and  situation ?Mood: Euthymic ?Affect: Appropriate and Congruent ?Thought Processes and Associations: Coherent and Intact ?Fund of Knowledge: Good ?Thought Content: WDL ?Insight: Good ?Judgement: Good ? ?UDS:4/1

## 2022-02-23 ENCOUNTER — Ambulatory Visit (HOSPITAL_BASED_OUTPATIENT_CLINIC_OR_DEPARTMENT_OTHER): Payer: BC Managed Care – PPO | Admitting: *Deleted

## 2022-02-23 ENCOUNTER — Other Ambulatory Visit (HOSPITAL_COMMUNITY): Payer: BC Managed Care – PPO | Admitting: Licensed Clinical Social Worker

## 2022-02-23 ENCOUNTER — Telehealth (HOSPITAL_COMMUNITY): Payer: Self-pay | Admitting: *Deleted

## 2022-02-23 ENCOUNTER — Other Ambulatory Visit (HOSPITAL_COMMUNITY): Payer: Self-pay | Admitting: Medical

## 2022-02-23 ENCOUNTER — Other Ambulatory Visit (HOSPITAL_COMMUNITY): Payer: Self-pay | Admitting: *Deleted

## 2022-02-23 DIAGNOSIS — Z8669 Personal history of other diseases of the nervous system and sense organs: Secondary | ICD-10-CM | POA: Diagnosis not present

## 2022-02-23 DIAGNOSIS — F1320 Sedative, hypnotic or anxiolytic dependence, uncomplicated: Secondary | ICD-10-CM

## 2022-02-23 DIAGNOSIS — F1021 Alcohol dependence, in remission: Secondary | ICD-10-CM

## 2022-02-23 DIAGNOSIS — R451 Restlessness and agitation: Secondary | ICD-10-CM | POA: Diagnosis not present

## 2022-02-23 DIAGNOSIS — F1210 Cannabis abuse, uncomplicated: Secondary | ICD-10-CM | POA: Diagnosis not present

## 2022-02-23 DIAGNOSIS — F17219 Nicotine dependence, cigarettes, with unspecified nicotine-induced disorders: Secondary | ICD-10-CM | POA: Diagnosis not present

## 2022-02-23 DIAGNOSIS — Z79899 Other long term (current) drug therapy: Secondary | ICD-10-CM | POA: Diagnosis not present

## 2022-02-23 DIAGNOSIS — F419 Anxiety disorder, unspecified: Secondary | ICD-10-CM | POA: Diagnosis not present

## 2022-02-23 DIAGNOSIS — F1020 Alcohol dependence, uncomplicated: Secondary | ICD-10-CM

## 2022-02-23 DIAGNOSIS — G621 Alcoholic polyneuropathy: Secondary | ICD-10-CM | POA: Diagnosis not present

## 2022-02-23 DIAGNOSIS — F4312 Post-traumatic stress disorder, chronic: Secondary | ICD-10-CM | POA: Diagnosis not present

## 2022-02-23 DIAGNOSIS — E43 Unspecified severe protein-calorie malnutrition: Secondary | ICD-10-CM | POA: Diagnosis not present

## 2022-02-23 MED ORDER — NALTREXONE 380 MG IM SUSR
380.0000 mg | Freq: Once | INTRAMUSCULAR | Status: AC
  Administered 2022-02-23: 380 mg via INTRAMUSCULAR

## 2022-02-23 NOTE — Progress Notes (Signed)
Daily Group Progress Note ? ?Program: CD IOP ? ? ?Group Time: 9 a.m. to 12 p.m.  ? ?Type of Therapy: Process and Psychoeducational  ? ?Topic: The therapist checks in with group members, assesses for SI/HI/psychosis and overall level of functioning. The therapist inquires about sobriety date and number of community support meetings attended since last session. The therapist and group members process events that went well and any challenges to recovery. The therapist reads the AA Daily Reflection asking what group members take this reflection to mean to them. The therapist facilitates group processing around several topics such how emotions are neither right or wrong and can be used to help people effectively navigate through life but that thoughts can be either rational or irrational. The therapist uses one group member's issues with one of her using friends who has responded badly to her efforts to practice self-care to illustrate the point that relationships with change as a result of a person getting sober and that as people get further into recovery that their relationships will become healthier. The therapist discusses how it is often a dead-end in trying to change someone's preconceived notions or prejudices about [name] and best to respond with the statement, ?I am sorry you choose to believe___.? The therapist talks about the importance of having non-using friends and sober supports and shows a Kimberly [name] Talk which is focused on post-traumatic growth and how to build physical, emotional, mental, and social resilience. The therapist continues Matrix handout RP1, ?Alcohol? having group members answer the question, ?What challenges have you faced in stopping drinking since you entered treatment?? ? ? ?Summary: Kimberly [name] presents for group rating her depression as a ?1? and anxiety as a ?2? with no SI or HI. She continues to attend two, AA meetings per day. In response to another member discussing his problems having  connected with an on-line meeting, Kimberly [name] suggests several good, on-line meetings and notes that if the person Googles ?AA speakers? if nothing else that he can watch a speaker meeting on YouTube which would count for a meeting. She talks about the fact that some of the local meetings that she attended have a ?social click? which is not in line with the principles of AA. She discusses how now that she is sober that she is ?struggling with acting on? certain things in her life noting that this therapist knows about what she is talking. Kimberly [name] is apparently alluding to her inability to decide about what to do with her marriage and with formally ending the relationship with the man with whom she had an affair. She says that a takeaway from today's meeting is that she needs to have three positives per day for every one negative in her life and needs to work on turning negative events into positives.  ? ? ?Progress Towards Goals: Kimberly [name] reports no ETOH use with AA meetings twice a day.  ? ?UDS collected: No  Results: No ? ?AA/NA attended?: Yes ? ?Sponsor?: Yes ? ?Adam Phenix, MA, LCSW, [facility], LCAS ?02/23/2022 ?

## 2022-02-23 NOTE — Telephone Encounter (Signed)
Pt presented for first Vivitrol 380 mg since d/c from [employer]. Vivitrol Together forms completed and signed by pt and provider. Pt has had injection previously so med ed was reinforced. Pt verbalizes understanding. Injection prepared as ordered and given in [facility] without compliant. Pt pleasant and cooperative on approach. Pt to return in approximately one month for next due injection. Pt encouraged to call office with any questions or concerns. Pt agrees.  ?

## 2022-02-23 NOTE — Progress Notes (Signed)
Patient ID: Kimberly [name], female   DOB: 03-01-1986, 36 y.o.   MRN: [id number] ?PAtient due for 2nd Vivitrol injection.Nurse will administer and begin paperwork for further monthly injections ?

## 2022-02-25 ENCOUNTER — Other Ambulatory Visit (HOSPITAL_COMMUNITY): Payer: BC Managed Care – PPO | Admitting: Licensed Clinical Social Worker

## 2022-02-25 DIAGNOSIS — F17219 Nicotine dependence, cigarettes, with unspecified nicotine-induced disorders: Secondary | ICD-10-CM | POA: Diagnosis not present

## 2022-02-25 DIAGNOSIS — Z8669 Personal history of other diseases of the nervous system and sense organs: Secondary | ICD-10-CM | POA: Diagnosis not present

## 2022-02-25 DIAGNOSIS — G621 Alcoholic polyneuropathy: Secondary | ICD-10-CM | POA: Diagnosis not present

## 2022-02-25 DIAGNOSIS — E43 Unspecified severe protein-calorie malnutrition: Secondary | ICD-10-CM | POA: Diagnosis not present

## 2022-02-25 DIAGNOSIS — F419 Anxiety disorder, unspecified: Secondary | ICD-10-CM | POA: Diagnosis not present

## 2022-02-25 DIAGNOSIS — F1210 Cannabis abuse, uncomplicated: Secondary | ICD-10-CM | POA: Diagnosis not present

## 2022-02-25 DIAGNOSIS — F1020 Alcohol dependence, uncomplicated: Secondary | ICD-10-CM | POA: Diagnosis not present

## 2022-02-25 DIAGNOSIS — R451 Restlessness and agitation: Secondary | ICD-10-CM | POA: Diagnosis not present

## 2022-02-25 DIAGNOSIS — Z79899 Other long term (current) drug therapy: Secondary | ICD-10-CM | POA: Diagnosis not present

## 2022-02-25 DIAGNOSIS — F4312 Post-traumatic stress disorder, chronic: Secondary | ICD-10-CM | POA: Diagnosis not present

## 2022-02-25 NOTE — Progress Notes (Signed)
Daily Group Progress Note ? ?Program: CD IOP ? ? ?Group Time: 9 a.m. to 12 p.m.  ? ?Type of Therapy: Process and Psychoeducational  ? ?Topic: The therapist checks in with group members, assesses for SI/HI/psychosis and overall level of functioning. The therapist inquires about sobriety date and number of community support meetings attended since last session. The therapist and group members process events that went well and any challenges to recovery. The therapist reads the AA Daily Reflection asking what group members take this reflection to mean to them. The therapist facilitates group processing around how to deal with interpersonal conflict assertively and how one's not admitting that something from a relationship is bothering him can be an obstacle to practicing self-care which is a path to emotional relapse. The therapist plays a video on the stages of relapse to help group members identify the early warning signs before they are heading into physical relapse. The therapist completes Matrix handout RP 1 Alcohol asking group members about how it is for them to witness other people's substance use when sober and what things that they depended on alcohol for such as for sexual or social reasons. The therapist goes over special occasions associated with alcohol and how to celebrate them different now. He covers Matrix Module RP 2 on ?Boredom? and begins module RP 3A on ?Avoiding Relapse Drift.?  ? ? ?Summary: Leidy presents for group rating his depression as a ?3? and anxiety as a ?4? with no SI or HI. She has continued to attend two, AA meetings per day. She looks noticeably tired today saying that she has not been sleeping well recently. She says that she was driving by [facility] where her grandparents are buried and realized that she cannot go there as driving by caused her to have a craving to drink as she used to go drink at their graves. She says that she also had cravings driving to sit with her  aunt saying that she used to take three shots on the way at specific points during the drive. She discusses her fear of relapse and the fact that she has felt ?indifferent? the past few days which concerns her. She receives feedback from the group and the therapist about things she can do to counter her indifference such as getting some sleep so as to not be so tired. She says that she plans on taking a beach trip this summer and says that she currently has no recreational activities in which she engages but likes to play cornhole but says that her husband is a ?stick in the mud.?   ? ? ?Progress Towards Goals: Railyn reports no ETOH use with AA meetings twice a day.  ? ?UDS collected: No  Results: No ? ?AA/NA attended?: Yes ? ?Sponsor?: Yes ? ? ?Adam Phenix, MA, LCSW, [facility], LCAS ?02/25/2022 ?

## 2022-02-28 ENCOUNTER — Telehealth (HOSPITAL_COMMUNITY): Payer: Self-pay | Admitting: Licensed Clinical Social Worker

## 2022-02-28 ENCOUNTER — Other Ambulatory Visit (HOSPITAL_COMMUNITY): Payer: BC Managed Care – PPO | Admitting: Licensed Clinical Social Worker

## 2022-02-28 DIAGNOSIS — Z8669 Personal history of other diseases of the nervous system and sense organs: Secondary | ICD-10-CM | POA: Diagnosis not present

## 2022-02-28 DIAGNOSIS — E43 Unspecified severe protein-calorie malnutrition: Secondary | ICD-10-CM | POA: Diagnosis not present

## 2022-02-28 DIAGNOSIS — F1020 Alcohol dependence, uncomplicated: Secondary | ICD-10-CM | POA: Diagnosis not present

## 2022-02-28 DIAGNOSIS — F17219 Nicotine dependence, cigarettes, with unspecified nicotine-induced disorders: Secondary | ICD-10-CM | POA: Diagnosis not present

## 2022-02-28 DIAGNOSIS — Z79899 Other long term (current) drug therapy: Secondary | ICD-10-CM | POA: Diagnosis not present

## 2022-02-28 DIAGNOSIS — F4312 Post-traumatic stress disorder, chronic: Secondary | ICD-10-CM | POA: Diagnosis not present

## 2022-02-28 DIAGNOSIS — G621 Alcoholic polyneuropathy: Secondary | ICD-10-CM | POA: Diagnosis not present

## 2022-02-28 DIAGNOSIS — R451 Restlessness and agitation: Secondary | ICD-10-CM | POA: Diagnosis not present

## 2022-02-28 DIAGNOSIS — F419 Anxiety disorder, unspecified: Secondary | ICD-10-CM | POA: Diagnosis not present

## 2022-02-28 DIAGNOSIS — F1210 Cannabis abuse, uncomplicated: Secondary | ICD-10-CM | POA: Diagnosis not present

## 2022-02-28 MED ORDER — VIVITROL 380 MG IM SUSR: 380.0000 mg | INTRAMUSCULAR | 11 refills | Status: DC

## 2022-02-28 MED ORDER — NALTREXONE 380 MG IM SUSR
380.0000 mg | Freq: Once | INTRAMUSCULAR | Status: AC
  Administered 2022-03-30: 380 mg via INTRAMUSCULAR

## 2022-02-28 NOTE — Progress Notes (Signed)
Daily Group Progress Note ? ?Program: CD IOP ? ? ?Group Time: 9:20 a.m. to 12 p.m.  ? ?Type of Therapy: Process and Psychoeducational  ? ?Topic: The therapist checks in with group members, assesses for SI/HI/psychosis and overall level of functioning. The therapist inquires about sobriety date and number of community support meetings attended since last session. The therapist and group members process events that went well and any challenges to recovery. The therapist facilitates a discussion today that focuses primarily on the ?insanity of the disease of addiction? pointing out how it leads to distorted thinking such that people can see a normal living in [place] housing situations or see no problem with being in what is an intolerable relationship. The therapist facilities a discussion on assertiveness and presents a module on the differences between passive, assertive, and aggressive behaviors and the things in a person's history that might have caused him or her to become passive or aggressive as opposed to assertive. The therapist suggest that being assertive is practicing self-care which helps people to remain emotionally health so as to avoid emotional relapse which in turn leads to mental relapse which in turn leads ultimately to physical relapse.  ? ? ?Summary: Kimberly [name] presents for group about 20 minutes late due to attending a meeting this morning to pick-up her 60-day chip. She rates her depression as a ?2? and anxiety as a ?3? with no SI or HI. She says that she ?shared a lot? at this meeting which was a first noting that the focus of the meeting was that recovery is a ?we process? and not a ?me process.? She shared about how she will keep a smile on her face for everybody when she is perhaps not doing so great. She admits that she was sharing this to let the woman who are cliquish to perhaps consider inviting her out to coffee or lunch. She says that after she shared that quite a few people came up to  her after group which has not happened previously. When group members are asked about problems, issues, or concerns they are having in relation to recovery; Kimberly [name] talks about how she got upset that her aunt for whom she sits asked her to buy cigarettes for her even though the aunt has COPC and was told that she has only 6 months to live. Kimberly [name] also says that she believes that she is the ?reason? that her aunt is ?in the state she's in? because Kimberly [name] used to buy cigarettes for her in the past when she was sitting for her and Kimberly [name] was drinking. The group and therapist provide Kimberly [name] with feedback in pointing out that her aunt can, and likely has, found other ways to get cigarettes. The therapist suggests that Kimberly [name] may be overstepping her bounds in getting involved in the issue of her other aunt and uncle allowing this aunt to stay with them when this aunt is paying for nothing. Kimberly [name] admits that she no longer wants to sit with this aunt saying that her aunt's negative attitude puts Kimberly [name]'s sobriety at risk. ? ?The therapist models for Kimberly [name] a number of ways that she can inform her aunt that she no longer wants to be her C.N.A. and helps Kimberly [name] to identify the underlying unhelpful or irrational belief that causes her to feel like a bad person if she does not continue to be her C.N.A. Kimberly [name] says that the module shared on assertiveness today shares information that fits her perfectly.  ? ? ?Progress Towards Goals:  Kimberly [name] reports no alcohol use while having a Publishing copy and attending two meetings per day.  ? ?UDS collected: Yes  Results: Yes; negative for ETOH. ? ?AA/NA attended?: Yes ? ?Sponsor?: Yes ? ? ?Individual Time: 12:10 p.m. to 1:30 p.m. ? ?The therapist meets with Kimberly [name] for an individual session today. She says that she is upset that everyone is attending a meeting tonight at 7 p.m. celebrating her 60 days but her husband. She does not know the reason that her husband said "no" to attending  but assumes it is related to his having to get up corn; however, she says that his taking an hour to attend would not interfere with this.  ? ?She also talks about how her 74 year old son's ask her when she will get a "real job" having found out that that [name] was talking to them about this. Kimberly [name] says that she had to set limits with one of her son's who questioned her regarding who was calling her on her phone. Kimberly [name] says that when she works that her husband's mother watches her kids and talks to them about what a "sorry mother" Kimberly [name] is when doing so. Kimberly [name] says that her husband and no one else in the family addresses this inappropriate behavior on her mother-in-law's part just chalking it up to this being how she is.  ? ?Kimberly [name] talks about the fact that her husband will not have their son take his ADHD medication on the weekend but ends up spanking him at times when he takes him with him to work essentially due to behavior attributable to his ADHD. The therapist shares data on the fact that corporal punishment is not an effective means of behavior change and recommends that the child be given his medication in situations in which his concentration and attention are needed. ? ?The therapist talks to Kimberly [name] about the reason she has not ended her relationship completely with the guy with whom she had the affair pointing out incongruencies between her words and her actions. Kimberly [name] admits that perhaps the real reason she has not taken action is not so much that she has feelings for him or wants the attention that he provides that her husband does not but is fearful he will blow-up and go public with the affair. The therapist notes that this is a possibility; however, observes that this man may not want to do this as exposing her extramarital affair would make his wife aware of his own affair.  ? ?The therapist talks to Kimberly [name] about her lack of assertiveness such as the fact that Kimberly [name] does  not ask her husband the reason that he is not attending the meeting this evening and does not share her feelings with him.  ? ?Kimberly [name] leaves the session saying that she is likely going to ask her husband about this and will end the relationship with the guy with whom she had the affair via phone. She also recognizes that she needs to address the issue with her husband putting her sons in a parental role by discussing his issues with their mother and needs to address her mother-in-law undermining her as a parent which also is not healthy for her children to endure.  ? ?Adam Phenix, MA, LCSW, [facility], LCAS ?02/28/2022 ?

## 2022-02-28 NOTE — Telephone Encounter (Signed)
Therapist attempts to reach [place] regarding prescriptions for Xanax which she has been getting filled but apparently not taking leaving a HIPAA-compliant voicemail. ? ?Adam Phenix, MA, LCSW, [facility], LCAS ?02/28/2022 ?

## 2022-02-28 NOTE — Telephone Encounter (Signed)
The therapist receives a return call from [employer] confirming her identify via two identifiers.  ? ?She says that the Alprazolam was prescribed by her PCP; however, he stopped prescribing it since she told him about her problem with alcohol. She says that the prescriptions being filled are being fill automatically; however, she has not gone to pick them up. ? ?She says that she will call her PCP to ask him to cancel these refills at the pharmacy. ? ?Adam Phenix, MA, LCSW, [facility], LCAS ?02/28/2022 ?

## 2022-02-28 NOTE — Addendum Note (Signed)
Addended by: Dara Hoyer on: 02/28/2022 04:48 PM ? ? Modules accepted: Orders ? ?

## 2022-03-02 ENCOUNTER — Other Ambulatory Visit (HOSPITAL_COMMUNITY): Payer: BC Managed Care – PPO | Admitting: Licensed Clinical Social Worker

## 2022-03-02 DIAGNOSIS — F1020 Alcohol dependence, uncomplicated: Secondary | ICD-10-CM

## 2022-03-02 DIAGNOSIS — R451 Restlessness and agitation: Secondary | ICD-10-CM | POA: Diagnosis not present

## 2022-03-02 DIAGNOSIS — F419 Anxiety disorder, unspecified: Secondary | ICD-10-CM | POA: Diagnosis not present

## 2022-03-02 DIAGNOSIS — E43 Unspecified severe protein-calorie malnutrition: Secondary | ICD-10-CM | POA: Diagnosis not present

## 2022-03-02 DIAGNOSIS — F1210 Cannabis abuse, uncomplicated: Secondary | ICD-10-CM | POA: Diagnosis not present

## 2022-03-02 DIAGNOSIS — F17219 Nicotine dependence, cigarettes, with unspecified nicotine-induced disorders: Secondary | ICD-10-CM | POA: Diagnosis not present

## 2022-03-02 DIAGNOSIS — Z79899 Other long term (current) drug therapy: Secondary | ICD-10-CM | POA: Diagnosis not present

## 2022-03-02 DIAGNOSIS — Z8669 Personal history of other diseases of the nervous system and sense organs: Secondary | ICD-10-CM | POA: Diagnosis not present

## 2022-03-02 DIAGNOSIS — G621 Alcoholic polyneuropathy: Secondary | ICD-10-CM | POA: Diagnosis not present

## 2022-03-02 DIAGNOSIS — F4312 Post-traumatic stress disorder, chronic: Secondary | ICD-10-CM | POA: Diagnosis not present
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
## 2022-03-03 ENCOUNTER — Ambulatory Visit (HOSPITAL_COMMUNITY)
Admission: RE | Admit: 2022-03-03 | Discharge: 2022-03-03 | Disposition: A | Discharge status: Home / Self Care | Payer: BC Managed Care – PPO | Source: Ambulatory Visit | Attending: Internal Medicine | Admitting: Internal Medicine

## 2022-03-03 DIAGNOSIS — N6489 Other specified disorders of breast: Secondary | ICD-10-CM | POA: Diagnosis not present

## 2022-03-03 DIAGNOSIS — N63 Unspecified lump in unspecified breast: Secondary | ICD-10-CM

## 2022-03-03 DIAGNOSIS — R922 Inconclusive mammogram: Secondary | ICD-10-CM | POA: Diagnosis not present

## 2022-03-04 ENCOUNTER — Other Ambulatory Visit (HOSPITAL_COMMUNITY): Payer: BC Managed Care – PPO | Admitting: Licensed Clinical Social Worker

## 2022-03-04 ENCOUNTER — Telehealth (HOSPITAL_COMMUNITY): Payer: Self-pay | Admitting: *Deleted

## 2022-03-04 DIAGNOSIS — Z8669 Personal history of other diseases of the nervous system and sense organs: Secondary | ICD-10-CM | POA: Diagnosis not present

## 2022-03-04 DIAGNOSIS — E43 Unspecified severe protein-calorie malnutrition: Secondary | ICD-10-CM | POA: Diagnosis not present

## 2022-03-04 DIAGNOSIS — F1020 Alcohol dependence, uncomplicated: Secondary | ICD-10-CM | POA: Diagnosis not present

## 2022-03-04 DIAGNOSIS — Z79899 Other long term (current) drug therapy: Secondary | ICD-10-CM | POA: Diagnosis not present

## 2022-03-04 DIAGNOSIS — G621 Alcoholic polyneuropathy: Secondary | ICD-10-CM | POA: Diagnosis not present

## 2022-03-04 DIAGNOSIS — F17219 Nicotine dependence, cigarettes, with unspecified nicotine-induced disorders: Secondary | ICD-10-CM | POA: Diagnosis not present

## 2022-03-04 DIAGNOSIS — F4312 Post-traumatic stress disorder, chronic: Secondary | ICD-10-CM | POA: Diagnosis not present

## 2022-03-04 DIAGNOSIS — R451 Restlessness and agitation: Secondary | ICD-10-CM | POA: Diagnosis not present

## 2022-03-04 DIAGNOSIS — F419 Anxiety disorder, unspecified: Secondary | ICD-10-CM | POA: Diagnosis not present

## 2022-03-04 DIAGNOSIS — F1210 Cannabis abuse, uncomplicated: Secondary | ICD-10-CM | POA: Diagnosis not present

## 2022-03-04 NOTE — Progress Notes (Signed)
Daily Group Progress Note ? ?Program: CD IOP ? ? ?Group Time: 9 a.m. to 12 p.m.  ? ?Type of Therapy: Process and Psychoeducational  ? ?Topic: The therapist checks in with group members, assesses for SI/HI/psychosis and overall level of functioning. The therapist inquires about sobriety date and number of community support meetings attended since last session. The therapist and group members process events that went well and any challenges to recovery. The therapist covers Matrix handout RP 6 Staying Busy with the major focus of the group being on the importance of self-care and the importance of not having idle time and staying busy while being sure to include fun activities into the schedule. The therapist suggests that a person who has nothing to do and nowhere to go can be at risk to using due to boredom; however, some people find being alone a trigger due to being left alone with their own negative, critical self-talk. The therapist introduces a new member to group today with all group members giving brief accounts of how they ended up in [facility].  ? ?Summary: Kimberly [name] presents for group rating her depression as a ?2? and anxiety as a ?4? with no SI or HI. She says that she has been attending a meeting per day but plans on attending two meetings today. She is working on Step 3 with her Publishing copy. She concludes that she believes she is now doing o.k. 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 Kimberly [name] admits that she does not really have any fun activities in her life. With prompting from the group, she says that she would like to start trail walking and perhaps go bowling again and play pool if she could find a pool hall that does not have lots of alcohol consumption. She also says that she would like to go rollerblading. Kimberly [name] is active during today's group in providing support and encouragement to the other female group  member encouraging her to get rest and practice self-care.  ? ?Progress Towards Goals: Kimberly [name] reports no alcohol use.      ? ?UDS collected: No  Results: No ? ?AA/NA attended?: Yes ? ?Sponsor?: Yes ? ? ?Adam Phenix, MA, LCSW, [facility], LCAS ?03/04/2022 ?

## 2022-03-04 NOTE — Telephone Encounter (Signed)
PA FOR PROTONIX 40 MG TABS SUBMITTED AND APPROVED VIA [employer]S.  ? ?MEDICATION APPROVED FROM 03/04/22 THROUGH 03/03/23.  ? ?KEY # [id number]. NO PA CASE # PROVIDED.  ?

## 2022-03-07 ENCOUNTER — Telehealth (HOSPITAL_COMMUNITY): Payer: Self-pay | Admitting: Licensed Clinical Social Worker

## 2022-03-07 ENCOUNTER — Other Ambulatory Visit (HOSPITAL_COMMUNITY): Payer: BC Managed Care – PPO | Attending: Psychiatry | Admitting: Licensed Clinical Social Worker

## 2022-03-07 DIAGNOSIS — F1020 Alcohol dependence, uncomplicated: Secondary | ICD-10-CM | POA: Diagnosis not present
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
## 2022-03-07 NOTE — Telephone Encounter (Signed)
The therapist calls Yaquelin leaving a message that she needs to call [phone] to give permission to have her "shot" shipped to this office. The therapist leaves his direct contact number if she has any questions or concerns. ? ?Adam Phenix, MA, LCSW, [facility], LCAS ?03/07/2022 ?

## 2022-03-08 ENCOUNTER — Other Ambulatory Visit (HOSPITAL_COMMUNITY): Payer: Self-pay | Admitting: Medical

## 2022-03-09 ENCOUNTER — Other Ambulatory Visit (HOSPITAL_COMMUNITY): Payer: BC Managed Care – PPO | Admitting: Licensed Clinical Social Worker

## 2022-03-09 DIAGNOSIS — F1020 Alcohol dependence, uncomplicated: Secondary | ICD-10-CM | POA: Diagnosis not present
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
## 2022-03-10 NOTE — Telephone Encounter (Signed)
approved

## 2022-03-11 ENCOUNTER — Encounter (HOSPITAL_COMMUNITY): Payer: BC Managed Care – PPO

## 2022-03-14 ENCOUNTER — Other Ambulatory Visit (HOSPITAL_COMMUNITY): Payer: BC Managed Care – PPO | Admitting: Licensed Clinical Social Worker

## 2022-03-14 DIAGNOSIS — F1020 Alcohol dependence, uncomplicated: Secondary | ICD-10-CM | POA: Diagnosis not present

## 2022-03-15 NOTE — Progress Notes (Signed)
?  Daily Group Progress Note ? ?Program: CD-IOP ? ? ?Group Time: 9:00 - 10:30 ? ?Participation Level: Active ? ?Behavioral Response: Appropriate ? ?Type of Therapy: Group Therapy ? ?Topic: Cln led check-in and debrief regarding the weekend. Cln led processing group on struggles, barriers, and successes from the weekend.  ? ? ?Group Time: 10:30 - 12:00 ? ?Participation Level: Active ? ?Behavioral Response: Appropriate ? ?Type of Therapy: Group Therapy ? ?Topic: Cln led discussion on truthfulness, utilizing the [employer] client workbook, RP8. Group discussed the ways in which truthfulness is absent during addiction and the ways in which patients continue to struggle while in their recovery. Cln encouraged pt's to consider that big feelings are dangerous for [name], especially in early recovery, because of reverting to our habit of managing feelings with the drug of choice. Infusing honesty and truthfulness can decrease big negative feelings from occurring. Cln and group offered encouragement and support to pt's as they discussed the areas lacking truth in their lives. Cln reminded pt's that group, their therapist, and their sponsors are here to support them during rough periods such as while they are re-engaging in truthfulness.  ? ? ?Summary: Kimberly [name] reports she had a good weekend with family and attended 4 AA meetings since last session. Pt states no cravings or use since last session. Pt reports minimal depression and anxiety symptoms and is eating and sleeping well. Pt denies SI/HI. Pt is able to process with group members about struggles in sobriety. ?During truthfulness group, pt shared that she has one big area in which she is not being truthful and it is bothering her. Pt reports feeling as if she cannot be truthful because it would "destroy" people around her. Pt reports "knowing" what she needs to do and struggling with completing it. Pt states she has discussed pieces of this issue with her therapist and  sponsor. Pt states a new issue arose last night and she is feeling even more guilty. Pt is tearful when discussing. Pt states she will tell sponsor and therapist about this new issue and allow them to support her through doing what she needs to do.  ? ? ?Progress Towards Goals: Progressing  ?Kimberly [name] reports no alcohol use and continues to attend one to two AA meetings per day and work with her sponsor  ? ?UDS collected: No Results: negative ? ?AA/NA attended?: Yes ? Sponsor?: Yes ? ? ?Kimberly Glass, LCSW ? ? ? ? ?

## 2022-03-16 ENCOUNTER — Other Ambulatory Visit (HOSPITAL_COMMUNITY): Payer: BC Managed Care – PPO | Admitting: Licensed Clinical Social Worker

## 2022-03-16 ENCOUNTER — Encounter (HOSPITAL_COMMUNITY): Payer: Self-pay | Admitting: Licensed Clinical Social Worker

## 2022-03-16 ENCOUNTER — Other Ambulatory Visit (HOSPITAL_COMMUNITY): Payer: Self-pay | Admitting: Medical

## 2022-03-16 DIAGNOSIS — F1210 Cannabis abuse, uncomplicated: Secondary | ICD-10-CM

## 2022-03-16 DIAGNOSIS — F1320 Sedative, hypnotic or anxiolytic dependence, uncomplicated: Secondary | ICD-10-CM

## 2022-03-16 DIAGNOSIS — F1020 Alcohol dependence, uncomplicated: Secondary | ICD-10-CM | POA: Diagnosis not present

## 2022-03-16 DIAGNOSIS — Z8669 Personal history of other diseases of the nervous system and sense organs: Secondary | ICD-10-CM

## 2022-03-16 DIAGNOSIS — Z8659 Personal history of other mental and behavioral disorders: Secondary | ICD-10-CM

## 2022-03-16 DIAGNOSIS — F1994 Other psychoactive substance use, unspecified with psychoactive substance-induced mood disorder: Secondary | ICD-10-CM

## 2022-03-16 DIAGNOSIS — F4329 Adjustment disorder with other symptoms: Secondary | ICD-10-CM

## 2022-03-16 DIAGNOSIS — G621 Alcoholic polyneuropathy: Secondary | ICD-10-CM

## 2022-03-16 DIAGNOSIS — E43 Unspecified severe protein-calorie malnutrition: Secondary | ICD-10-CM

## 2022-03-16 DIAGNOSIS — F17219 Nicotine dependence, cigarettes, with unspecified nicotine-induced disorders: Secondary | ICD-10-CM

## 2022-03-16 DIAGNOSIS — F4312 Post-traumatic stress disorder, chronic: Secondary | ICD-10-CM

## 2022-03-16 DIAGNOSIS — F419 Anxiety disorder, unspecified: Secondary | ICD-10-CM

## 2022-03-16 MED ORDER — PANTOPRAZOLE SODIUM 40 MG PO TBEC: 40.0000 mg | DELAYED_RELEASE_TABLET | Freq: Every day | ORAL | 1 refills | Status: DC

## 2022-03-16 MED ORDER — BUPROPION HCL ER (SR) 150 MG PO TB12: 150.0000 mg | ORAL_TABLET | Freq: Two times a day (BID) | ORAL | 2 refills | Status: DC
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
## 2022-03-16 NOTE — Progress Notes (Signed)
? ?  [place] ?Follow-up Outpatient CDIOP ?Date:  ? ?Admission Date: ? ?Sobriety date: ? ?Subjective:  ? ?HPI  ? ?Review of Systems: ?Psychiatric: ?Agitation: No ?Hallucination: No ?Depressed Mood: Yes much better ?Insomnia: No ?Hypersomnia: No ?Altered Concentration: No ?Feels Worthless: No ?Grandiose Ideas: No ?Belief In Special Powers: No ?New/Increased Substance Abuse: No ?Compulsions: No ? ?Neurologic: ?Headache: No ?Seizure: No ?Paresthesias: No ? ?Current Medications: ? ? ?Vital Signs ? ?Mental Status Examination  ?Appearance: ?Alert: Yes ?Attention: good  ?Cooperative: Yes ?Eye Contact: Good ?Speech: Clear and coherent ?Psychomotor Activity: Normal ?Memory/Concentration: Normal/intact ?Oriented: person, place, time/date and situation ?Mood: Euthymic ?Affect: Appropriate and Congruent ?Thought Processes and Associations: Coherent and Intact ?Fund of Knowledge: Good ?Thought Content: WDL ?Insight: Good ?Judgement: Good ? ?UDS: ? ?PDMP: ? ?Diagnosis:  ? ?Assessment: ? ?Treatment Plan: ?Darlyne Russian, PA-C  ?

## 2022-03-17 ENCOUNTER — Other Ambulatory Visit (HOSPITAL_COMMUNITY): Payer: Self-pay | Admitting: Medical

## 2022-03-18 ENCOUNTER — Other Ambulatory Visit (HOSPITAL_COMMUNITY): Payer: BC Managed Care – PPO | Admitting: Licensed Clinical Social Worker

## 2022-03-18 DIAGNOSIS — F1020 Alcohol dependence, uncomplicated: Secondary | ICD-10-CM | POA: Diagnosis not present

## 2022-03-18 NOTE — Progress Notes (Signed)
Daily Group Progress Note ? ?Program: CD IOP ? ? ?Group Time: 9 a.m. to 12 p.m.  ? ?Type of Therapy: Process and Psychoeducational  ? ?Topic: The therapist checks in with group members, assesses for SI/HI/psychosis and overall level of functioning. The therapist inquires about sobriety date and number of community support meetings attended since last session. The therapist and group members process events that went well and any challenges to recovery. The therapist facilitates a discussion on the different type of AA meetings and the differences between [place] meetings and NA meetings. The therapist clarifies what is meant by avoiding mind-altering drugs emphasizing that this does not mean avoiding one's non-addictive, medication medications. The therapist illustrates how to make a differentiation between addictive and non-addictive medications noting that taking too much of a non-addictive medication does not lead to a high or intoxication and that non-addictive medications do not cause physiological dependence. Additionally, people do not crave non-addictive medications and non-addictive medications have zero street value. The therapist provides information on how grief relates to recovery from addiction in that people in recovery must grieve things that they have lost and even grieve the loss of their old identity. Additionally, substance use can be used as a means of numbing and avoiding the pain of the grief of losing loved ones. The therapist talks about healthy ways to cope with grief such as attending a bereavement support group. The therapist educates group members on how many people in recovery experienced early childhood trauma talking to them about the ACES study and its implications.  ? ?Summary: Kimberly [name] presents for group rating her depression as a ?4? and anxiety as a ?3? with no SI or HI. She presents to group with coffee, a Dr. Malachi Bonds, and a Red Bull energy drink which she consumes during group. She  encourages the newer group members to attend meetings and offers again to attend an [place] meeting with one of the members tonight. She shares less than usual during today's group and appears somewhat distracted which may be related to a phone call she takes during break in which she finds out that her aunt is in the hospital. In the discussion on grief, Kimberly [name] admits that she does not know how to navigate the grief process without drinking. She says that the P.A. encouraged her to attend a bereavement support group which she agrees is a good idea and something that she plans on doing. She admits, like most of the group, that she has continued to have thoughts of drinking; however, she denies any cravings to drink.  ? ?Progress Towards Goals: Kimberly [name] reports no ETOH use.  ? ?UDS collected: No  Results: No ? ?AA/NA attended?: Yes ? ?Sponsor?: Yes ? ? ?Adam Phenix, MA, LCSW, [facility], LCAS ?03/18/2022 ?

## 2022-03-21 ENCOUNTER — Other Ambulatory Visit (HOSPITAL_COMMUNITY): Payer: BC Managed Care – PPO | Admitting: Licensed Clinical Social Worker

## 2022-03-21 DIAGNOSIS — F1020 Alcohol dependence, uncomplicated: Secondary | ICD-10-CM | POA: Diagnosis not present

## 2022-03-21 NOTE — Progress Notes (Signed)
Daily Group Progress Note ? ?Program: CD IOP ? ? ?Group Time: 9 a.m. to 12 p.m.  ? ?Type of Therapy: Process and Psychoeducational  ? ?Topic: The therapist checks in with group members, assesses for SI/HI/psychosis and overall level of functioning. The therapist inquires about sobriety date and number of community support meetings attended since last session. The therapist has the group review the video, ?Break the Addiction Cycle? by Delphia Grates, RN CCDN pausing the video so as to allow the group members to complete and discuss the exercises in the video examining their beliefs as to what they thought an alcoholic or addict looked like, how alcohol and drugs were viewed in their family of origin, their personal drug and alcohol using rituals, etc. The therapist points out that the video is emphasizing that trying to stop the thoughts of using is not possible and that the point of focus needs to be on not engaging in the rituals. The therapist talks to group members about the difference between habits and rituals.  ? ?Summary: Kimberly [name] presents for group rating her depression as a ?2? and anxiety as a ?3? with no SI or HI. She reports being in a good mood today having had a good Mother's Day yesterday. She says that if her individual therapy session is today that she will need to cancel. When the therapist informs her that she is not on the schedule today for individual therapy and asks when she wants to reschedule, she opts for next Monday versus this week as she has no pressing issues currently. Kimberly [name] recounts that alcohol use was accepted in her family of origin provided it was not excessive; however, drug use was frowned upon completely. Kimberly [name] had a number of rituals around her drinking such as taking a certain number of shots at certain points in her drive to work. When asked by another group member the reason that she decided to stop, Kimberly [name] says that she got to a point physically, emotionally, and  spiritually that she felt like something very bad was going to happen so recognized the need to get help. Kimberly [name] actively avoids things associated with her drinking such as anything that contains cinnamon as it reminds her of the drink, fireballs.   ? ?Progress Towards Goals: Kimberly [name] reports continued sobriety with regular AA attendance.   ? ?UDS collected: Yes  Results: Yes; negative for alcohol  ? ?AA/NA attended?: Yes ? ?Sponsor?: Yes ? ? ?Adam Phenix, MA, LCSW, [facility], LCAS ?03/21/2022 ?

## 2022-03-23 ENCOUNTER — Encounter (HOSPITAL_COMMUNITY): Payer: BC Managed Care – PPO

## 2022-03-23 ENCOUNTER — Telehealth (HOSPITAL_COMMUNITY): Payer: Self-pay | Admitting: Licensed Clinical Social Worker

## 2022-03-23 NOTE — Telephone Encounter (Signed)
The therapist receives the following email from [place] this morning: ? ?"Good morning Bill. I hope your doing good. I'm not going to be able to make it today. I'm just not feeling well at all. I'll see yall Friday!! ? ?Thanks, ?Lourdes Sledge" ? ?The therapist sends a separate email to [email or web address] with the following: ? ? ? "Hope you feel better!" ? ? ?Adam Phenix, MA, LCSW, [facility], LCAS ?03/23/2022 ?

## 2022-03-25 ENCOUNTER — Other Ambulatory Visit (HOSPITAL_COMMUNITY): Payer: BC Managed Care – PPO | Admitting: Licensed Clinical Social Worker

## 2022-03-25 DIAGNOSIS — F1020 Alcohol dependence, uncomplicated: Secondary | ICD-10-CM
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
## 2022-03-28 ENCOUNTER — Encounter (HOSPITAL_COMMUNITY): Payer: BC Managed Care – PPO

## 2022-03-28 ENCOUNTER — Telehealth (HOSPITAL_COMMUNITY): Payer: Self-pay | Admitting: Licensed Clinical Social Worker

## 2022-03-28 DIAGNOSIS — F1020 Alcohol dependence, uncomplicated: Secondary | ICD-10-CM | POA: Diagnosis not present

## 2022-03-28 NOTE — Telephone Encounter (Signed)
The therapist receives an email from [place] at 7:53 a.m. today indicating that she will not be attending group today as she is going to see her doctor about her headaches.  Adam Phenix, [place], LCSW, [facility], [place] 03/28/2022

## 2022-03-30 ENCOUNTER — Ambulatory Visit (HOSPITAL_BASED_OUTPATIENT_CLINIC_OR_DEPARTMENT_OTHER): Payer: BC Managed Care – PPO | Admitting: *Deleted

## 2022-03-30 ENCOUNTER — Encounter (HOSPITAL_COMMUNITY): Payer: Self-pay | Admitting: *Deleted

## 2022-03-30 ENCOUNTER — Other Ambulatory Visit (HOSPITAL_COMMUNITY): Payer: BC Managed Care – PPO | Admitting: Licensed Clinical Social Worker

## 2022-03-30 ENCOUNTER — Encounter (HOSPITAL_COMMUNITY): Payer: Self-pay | Admitting: Licensed Clinical Social Worker

## 2022-03-30 VITALS — BP 121/80 | HR 91 | Resp 18 | Ht 67.0 in | Wt 159.6 lb

## 2022-03-30 DIAGNOSIS — F4312 Post-traumatic stress disorder, chronic: Secondary | ICD-10-CM

## 2022-03-30 DIAGNOSIS — F1210 Cannabis abuse, uncomplicated: Secondary | ICD-10-CM

## 2022-03-30 DIAGNOSIS — F1320 Sedative, hypnotic or anxiolytic dependence, uncomplicated: Secondary | ICD-10-CM

## 2022-03-30 DIAGNOSIS — F4329 Adjustment disorder with other symptoms: Secondary | ICD-10-CM

## 2022-03-30 DIAGNOSIS — E43 Unspecified severe protein-calorie malnutrition: Secondary | ICD-10-CM

## 2022-03-30 DIAGNOSIS — F1021 Alcohol dependence, in remission: Secondary | ICD-10-CM

## 2022-03-30 DIAGNOSIS — F1020 Alcohol dependence, uncomplicated: Secondary | ICD-10-CM

## 2022-03-30 DIAGNOSIS — Z8659 Personal history of other mental and behavioral disorders: Secondary | ICD-10-CM

## 2022-03-30 DIAGNOSIS — Z8669 Personal history of other diseases of the nervous system and sense organs: Secondary | ICD-10-CM

## 2022-03-30 DIAGNOSIS — Z91410 Personal history of adult physical and sexual abuse: Secondary | ICD-10-CM

## 2022-03-30 DIAGNOSIS — G621 Alcoholic polyneuropathy: Secondary | ICD-10-CM

## 2022-03-30 DIAGNOSIS — F17219 Nicotine dependence, cigarettes, with unspecified nicotine-induced disorders: Secondary | ICD-10-CM

## 2022-03-30 DIAGNOSIS — F419 Anxiety disorder, unspecified: Secondary | ICD-10-CM

## 2022-03-30 DIAGNOSIS — F1994 Other psychoactive substance use, unspecified with psychoactive substance-induced mood disorder: Secondary | ICD-10-CM
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
## 2022-03-30 NOTE — Progress Notes (Incomplete Revision)
   [facility] Follow-up Outpatient CDIOP Date: 03/30/2022  Admission Date:  Sobriety date:  Subjective:   HPI   Review of Systems: Psychiatric: Agitation: No Hallucination: No Depressed Mood: Yes much better Insomnia: No Hypersomnia: No Altered Concentration: No Feels Worthless: No Grandiose Ideas: No Belief In Special Powers: No New/Increased Substance Abuse: No Compulsions: No  Neurologic: Headache: No Seizure: No Paresthesias: No  Current Medications:   Vital Signs  Mental Status Examination  Appearance: Alert: Yes Attention: good  Cooperative: Yes Eye Contact: Good Speech: Clear and coherent Psychomotor Activity: Normal Memory/Concentration: Normal/intact Oriented: person, place, time/date and situation Mood: Euthymic Affect: Appropriate and Congruent Thought Processes and Associations: Coherent and Intact Fund of Knowledge: Good Thought Content: WDL Insight: Good Judgement: Good  UDS:  PDMP:  Diagnosis:   Assessment:  Treatment Plan: Darlyne Russian, PA-C

## 2022-03-30 NOTE — Patient Instructions (Signed)
Pt in to see nurse today for due 380 mg  Vivitrol injection. Pt approbate and cooperative on approach. Affect appropriate to mood. Vss. Injection was prepared as ordered and given in the RUOQ without complaint. Pt is to return in 4 weeks for next due injection.

## 2022-04-01 ENCOUNTER — Other Ambulatory Visit (HOSPITAL_COMMUNITY): Payer: BC Managed Care – PPO | Admitting: Licensed Clinical Social Worker

## 2022-04-01 DIAGNOSIS — F1020 Alcohol dependence, uncomplicated: Secondary | ICD-10-CM

## 2022-04-01 NOTE — Progress Notes (Signed)
Daily Group Progress Note   Program: CD IOP     Group Time: 9 a.m. to 12 p.m.   Type of Therapy: Process and Psychoeducational    Topic: The therapist checks in with group members, assesses for SI/HI/psychosis and overall level of functioning. The therapist inquiries about sobriety date and number of community support meetings attended since last session. The therapist introduces a new group member and shares information regarding the [place] having 4.4% of the world's population which consuming 80% of the word's opiates. The therapist facilitates a discussion on what are the twelve steps and on how to work steps and how to pick a Publishing copy. He makes group members aware of the [facility] treatment locator and educates them on the difference between a half-way house and an [place] and explains the services provided by long-term residential treatment facilities such as [facility] and the reason that certain individuals need this level of care. He provides group member with "First Step Worksheets" and shows a [employer] video illustrating that there is no such thing as a "soft" drug and a "hard" drug and that all go to the same part of the brain.    Summary: Kimberly [name] presents for group rating her depression as a "3" and anxiety as a "3" with no SI or HI. Kimberly [name] takes the lead in orienting the new group member about the twelve steps, how to pick a Sponsor, how to find a meeting, etc. She says that she is presently working on her 4th step with her Sponsor noting that she finally had a spiritual awakening. She says that this came in the form of having a realization that she harbored deep resentment towards her brother over the fact that he prioritized work such that he would not come and visit their mother when she was terminally ill when she asked him to do so. Kimberly [name] says that after talking to her Sponsor and crying about this that the resentment was lifted such that she tried to think of things over which to get  angry at him but could not and that she went home and "slept so good" that night. She says that she now realizes why the people in AA who have years of recovery are so happy.    Progress Towards Goals: Kimberly [name] reports no alcohol use.    UDS collected: No  Results: No   AA/NA attended?: Yes   Sponsor?: Yes     Adam Phenix, [place], [place], [facility], [place] 04/01/2022

## 2022-04-05 ENCOUNTER — Telehealth (HOSPITAL_COMMUNITY): Payer: Self-pay | Admitting: Licensed Clinical Social Worker

## 2022-04-05 NOTE — Telephone Encounter (Signed)
The therapist receives the following email from [place] today at 4:40 p.m. EST:  "Kimberly [name]. Your gonna fire me.... I have 2 doctors appts tomorrow. I won't be able to make it. I'll get notes so you don't think I'm just flacking on you. Hope your doing well. See ya Friday!!!"   [address], [place], LCSW, [facility], [place] 04/05/2022

## 2022-04-06 ENCOUNTER — Ambulatory Visit (INDEPENDENT_AMBULATORY_CARE_PROVIDER_SITE_OTHER): Payer: BC Managed Care – PPO | Admitting: Nurse Practitioner

## 2022-04-06 ENCOUNTER — Encounter: Payer: Self-pay | Admitting: Nurse Practitioner

## 2022-04-06 ENCOUNTER — Telehealth: Payer: Self-pay

## 2022-04-06 VITALS — BP 120/76 | Ht 67.0 in | Wt 160.0 lb

## 2022-04-06 DIAGNOSIS — Z01419 Encounter for gynecological examination (general) (routine) without abnormal findings: Secondary | ICD-10-CM

## 2022-04-06 DIAGNOSIS — Z9189 Other specified personal risk factors, not elsewhere classified: Secondary | ICD-10-CM

## 2022-04-06 DIAGNOSIS — R232 Flushing: Secondary | ICD-10-CM | POA: Diagnosis not present

## 2022-04-06 NOTE — Progress Notes (Signed)
   Kimberly [name] [birth year] [id number]   History:  36 y.o. G1P0102 presents for annual exam. Monthly cycles lately, but has history of irregularity the last few years. Still having hot flashes and mood changes. [place] 7 last year. Mother went through menopause in her 60s. BTL. Normal pap history.   Gynecologic History Patient's last menstrual period was 04/01/2022. Period Cycle (Days): 25 Period Duration (Days): 7 Period Pattern: Regular Menstrual Flow: Heavy, Moderate Dysmenorrhea: (!) Moderate Dysmenorrhea Symptoms: Cramping Contraception: tubal ligation Sexually active: Yes  Health Maintenance Last Pap: 07/03/2019. Results were: Normal, 5-year repeat Last mammogram (diagnostic): 03/03/2022. Results were: Benign fibrocystic changes Last colonoscopy: Not indicated Last Dexa: Not indicated  Past medical history, past surgical history, family history and social history were all reviewed and documented in the EPIC chart. Married. 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 71 yo twin boys. Mother (age 98s), maternal grandmother and great aunt with history of breast cancer. Mother BRCA negative.   ROS:  A ROS was performed and pertinent positives and negatives are included.  Exam:  Vitals:   04/06/22 1522  BP: 120/76  Weight: 160 lb (72.6 kg)  Height: _0  (1.702 m)    Body mass index is 25.06 kg/m.  General appearance:  Normal Thyroid:  Symmetrical, normal in size, without palpable masses or nodularity. Respiratory  Auscultation:  Clear without wheezing or rhonchi Cardiovascular  Auscultation:  Regular rate, without rubs, murmurs or gallops  Edema/varicosities:  Not grossly evident Abdominal  Soft,nontender, without masses, guarding or rebound.  Liver/spleen:  No organomegaly noted  Hernia:  None appreciated  Skin  Inspection:  Grossly normal   Breasts: Examined lying and sitting.   Right: Without masses, retractions, discharge or axillary  adenopathy.   Left: Without masses, retractions, discharge or axillary adenopathy. Genitourinary   Inguinal/mons:  Normal without inguinal adenopathy  External genitalia:  Normal appearing vulva with no masses, tenderness, or lesions  BUS/Urethra/Skene's glands:  Normal  Vagina:  Normal appearing with normal color and discharge, no lesions  Cervix:  Normal appearing without discharge or lesions  Uterus:  Normal in size, shape and contour.  Midline and mobile, nontender  Adnexa/parametria:     Rt: Normal in size, without masses or tenderness.   Lt: Normal in size, without masses or tenderness.  Anus and perineum: Normal  Digital rectal exam: Not indicated  Patient informed chaperone available to be present for breast and pelvic exam. Patient has requested no chaperone to be present. Patient has been advised what will be completed during breast and pelvic exam.   Assessment/Plan:  36 y.o. G1P0102 for annual exam.   Well female exam with routine gynecological exam - Education provided on SBEs, importance of preventative screenings, current guidelines, high calcium diet, regular exercise, and multivitamin daily. Labs done with PCP.  Hot flashes - provided her with OTC supplement options. Recommend magnesium nightly. [place] 7 last year.   At high risk for breast cancer - Lifetime breast cancer risk of 30.7%. Recommend annual breast MRI. PCP has ordered genetic testing and she plans to do this soon. Normal mammogram last month. Normal breast exam today.   Screening for cervical cancer - Normal Pap history.  Will repeat at 5-year interval per guidelines.  Follow up in 1 year for annual.     Tamela Gammon [facility], 4:06 PM 04/06/2022

## 2022-04-06 NOTE — Telephone Encounter (Signed)
-----   Message from Tamela Gammon, NP sent at 04/06/2022  4:02 PM EDT ----- Regarding: Breast MRI Please send referral for breast MRI. High risk for breast cancer with lifetime risk of 30.7%. Preferably 6 months after mammogram (around October). Thank you.

## 2022-04-07 ENCOUNTER — Other Ambulatory Visit (HOSPITAL_COMMUNITY): Payer: Self-pay | Admitting: Medical

## 2022-04-07 NOTE — Telephone Encounter (Signed)
Has rx

## 2022-04-08 ENCOUNTER — Other Ambulatory Visit (HOSPITAL_COMMUNITY): Payer: BC Managed Care – PPO | Attending: Psychiatry | Admitting: Licensed Clinical Social Worker

## 2022-04-08 DIAGNOSIS — F1994 Other psychoactive substance use, unspecified with psychoactive substance-induced mood disorder: Secondary | ICD-10-CM | POA: Diagnosis not present

## 2022-04-08 DIAGNOSIS — G4700 Insomnia, unspecified: Secondary | ICD-10-CM | POA: Diagnosis not present

## 2022-04-08 DIAGNOSIS — F1020 Alcohol dependence, uncomplicated: Secondary | ICD-10-CM

## 2022-04-08 DIAGNOSIS — F909 Attention-deficit hyperactivity disorder, unspecified type: Secondary | ICD-10-CM | POA: Insufficient documentation

## 2022-04-08 DIAGNOSIS — F1021 Alcohol dependence, in remission: Secondary | ICD-10-CM | POA: Insufficient documentation

## 2022-04-08 DIAGNOSIS — Z91410 Personal history of adult physical and sexual abuse: Secondary | ICD-10-CM | POA: Insufficient documentation

## 2022-04-08 DIAGNOSIS — F17218 Nicotine dependence, cigarettes, with other nicotine-induced disorders: Secondary | ICD-10-CM | POA: Insufficient documentation

## 2022-04-08 DIAGNOSIS — F4329 Adjustment disorder with other symptoms: Secondary | ICD-10-CM | POA: Insufficient documentation

## 2022-04-08 DIAGNOSIS — F1320 Sedative, hypnotic or anxiolytic dependence, uncomplicated: Secondary | ICD-10-CM | POA: Insufficient documentation

## 2022-04-08 DIAGNOSIS — E43 Unspecified severe protein-calorie malnutrition: Secondary | ICD-10-CM | POA: Diagnosis not present

## 2022-04-08 DIAGNOSIS — G621 Alcoholic polyneuropathy: Secondary | ICD-10-CM | POA: Insufficient documentation

## 2022-04-08 DIAGNOSIS — F4312 Post-traumatic stress disorder, chronic: Secondary | ICD-10-CM | POA: Diagnosis not present

## 2022-04-08 DIAGNOSIS — G610 Guillain-Barre syndrome: Secondary | ICD-10-CM | POA: Insufficient documentation

## 2022-04-08 DIAGNOSIS — F341 Dysthymic disorder: Secondary | ICD-10-CM | POA: Insufficient documentation

## 2022-04-08 DIAGNOSIS — Z79899 Other long term (current) drug therapy: Secondary | ICD-10-CM | POA: Insufficient documentation

## 2022-04-08 DIAGNOSIS — F1211 Cannabis abuse, in remission: Secondary | ICD-10-CM | POA: Insufficient documentation

## 2022-04-08 DIAGNOSIS — F419 Anxiety disorder, unspecified: Secondary | ICD-10-CM | POA: Diagnosis not present

## 2022-04-08 DIAGNOSIS — Z760 Encounter for issue of repeat prescription: Secondary | ICD-10-CM | POA: Diagnosis not present

## 2022-04-08 DIAGNOSIS — Z634 Disappearance and death of family member: Secondary | ICD-10-CM | POA: Diagnosis not present

## 2022-04-08 NOTE — Progress Notes (Signed)
Daily Group Progress Note  Program: CD IOP   Group Time: 9 a.m. to 12 p.m.   Type of Therapy: Process and Psychoeducational   Topic: The therapist checks in with group members, assesses for SI/HI/psychosis and overall level of functioning. The therapist inquires about sobriety date and number of community support meetings attended since last session. The therapist has the group members complete the GAIN-SS, the PHQ-9, the GAD-7, and the Aware Questionnaire 3.0 explaining what these screens are used for and how to interpret the results. The therapist educates group members on caffeine intoxication in conjunction with a discussion on reducing anxiety. The therapist explains the reason that research indicates that "recovery takes place in fellowship" and on how social isolation is a risk for relapse and the reason that the Twelve Step program can be a valuable support and why the program teaches that there is more to recovery that simply no longer drinking. The therapist has group members read and discuss the AA saying, "You are exactly where your Higher Power wants you to be."   Summary: Kimberly [name] presents for group rating her depression as a "3" and her anxiety as a "3" with no SI or HI. Her PHQ-9 score today is a "5" and his GAD-7 is an "8." She scores a "65" on the AWARE Questionnaire 3.0 indicating that he has an 21% of relapsing within the next two months. On the Gain Short Screener, he scores a "3" on the IDScr suggesting a diagnosis for a mood disorder. She says that she believes that she needs to have her Buspar increased from 30 mg to 45 mg due to elevated anxiety. She also asks the therapist what to do about some psychomotor agitation that she is experiencing in the form of bouncing her legs. The therapist explores Kimberly [name]'s caffeine consumption given that she comes to group with a sixteen-ounce Dr. Malachi [name], a large [employer] coffee, and a Red Bull. After adding up with she normally drinks, Kimberly [name]  consumes about 600 mg of caffeine per day with the national average being around 135 mg per day and the maximum safe amount being around 400 mg per day. Thus, she is encouraged to taper down her caffeine consumption to address her symptoms. Kimberly [name] shares with another group member her positive experiences with going to Kimberly [name]. She says that when she first started attending meetings that she noticed that her Sponsor got hugs from everybody which is something Kimberly [name] admits that she too wanted. She says that when she recently went to a meeting that she noticed that she now knew everyone's name and pointed out to her Sponsor that she is now the one getting hugs from everyone. She says that she is looking into taking some yoga classes and going back to school for phlebotomy. She is active in trying to encourage another group member who has some apparent resistance to attending AA to attend AA.   Progress Towards Goals: Kimberly [name] reports no alcohol use.      UDS collected: Yes  Results: Yes; negative for ETOH  AA/NA attended?: Yes  Sponsor?: Yes   Adam Phenix, [place], [place], [facility], [place] 04/08/2022

## 2022-04-11 ENCOUNTER — Encounter (HOSPITAL_COMMUNITY): Payer: Self-pay | Admitting: Medical

## 2022-04-11 ENCOUNTER — Other Ambulatory Visit (HOSPITAL_COMMUNITY): Payer: BC Managed Care – PPO | Admitting: Licensed Clinical Social Worker

## 2022-04-11 DIAGNOSIS — F1021 Alcohol dependence, in remission: Secondary | ICD-10-CM | POA: Diagnosis not present

## 2022-04-11 DIAGNOSIS — F1994 Other psychoactive substance use, unspecified with psychoactive substance-induced mood disorder: Secondary | ICD-10-CM

## 2022-04-11 DIAGNOSIS — F17219 Nicotine dependence, cigarettes, with unspecified nicotine-induced disorders: Secondary | ICD-10-CM

## 2022-04-11 DIAGNOSIS — G621 Alcoholic polyneuropathy: Secondary | ICD-10-CM

## 2022-04-11 DIAGNOSIS — F1020 Alcohol dependence, uncomplicated: Secondary | ICD-10-CM

## 2022-04-11 DIAGNOSIS — F17218 Nicotine dependence, cigarettes, with other nicotine-induced disorders: Secondary | ICD-10-CM | POA: Diagnosis not present

## 2022-04-11 DIAGNOSIS — Z760 Encounter for issue of repeat prescription: Secondary | ICD-10-CM | POA: Diagnosis not present

## 2022-04-11 DIAGNOSIS — E43 Unspecified severe protein-calorie malnutrition: Secondary | ICD-10-CM | POA: Diagnosis not present

## 2022-04-11 DIAGNOSIS — Z79899 Other long term (current) drug therapy: Secondary | ICD-10-CM | POA: Diagnosis not present

## 2022-04-11 DIAGNOSIS — Z634 Disappearance and death of family member: Secondary | ICD-10-CM | POA: Diagnosis not present

## 2022-04-11 DIAGNOSIS — F909 Attention-deficit hyperactivity disorder, unspecified type: Secondary | ICD-10-CM | POA: Diagnosis not present

## 2022-04-11 DIAGNOSIS — F4329 Adjustment disorder with other symptoms: Secondary | ICD-10-CM | POA: Diagnosis not present

## 2022-04-11 DIAGNOSIS — F1210 Cannabis abuse, uncomplicated: Secondary | ICD-10-CM

## 2022-04-11 DIAGNOSIS — F4312 Post-traumatic stress disorder, chronic: Secondary | ICD-10-CM | POA: Diagnosis not present

## 2022-04-11 DIAGNOSIS — F341 Dysthymic disorder: Secondary | ICD-10-CM | POA: Diagnosis not present

## 2022-04-11 DIAGNOSIS — F1320 Sedative, hypnotic or anxiolytic dependence, uncomplicated: Secondary | ICD-10-CM | POA: Diagnosis not present

## 2022-04-11 DIAGNOSIS — Z8669 Personal history of other diseases of the nervous system and sense organs: Secondary | ICD-10-CM

## 2022-04-11 DIAGNOSIS — F1211 Cannabis abuse, in remission: Secondary | ICD-10-CM | POA: Diagnosis not present

## 2022-04-11 DIAGNOSIS — G4700 Insomnia, unspecified: Secondary | ICD-10-CM | POA: Diagnosis not present

## 2022-04-11 DIAGNOSIS — F419 Anxiety disorder, unspecified: Secondary | ICD-10-CM | POA: Diagnosis not present

## 2022-04-11 NOTE — Progress Notes (Signed)
Patient ID: Kimberly [name], female   DOB: 1986-04-09, 36 y.o.   MRN: [id number] PDMP Xanax # 120 0.5 mg refill 4/4 per [facility] 04/04/22 refill 3/4 02/24/2022 Refill 2/4 01/27/2022 refill 1/4 12/10/2021 [name], MD  [address] [place]  [place]  [id number]

## 2022-04-11 NOTE — Telephone Encounter (Signed)
Recall placed patient aware to call and schedule.
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
## 2022-04-13 ENCOUNTER — Encounter (HOSPITAL_COMMUNITY): Payer: BC Managed Care – PPO

## 2022-04-15 ENCOUNTER — Other Ambulatory Visit (HOSPITAL_COMMUNITY): Payer: BC Managed Care – PPO | Admitting: Licensed Clinical Social Worker

## 2022-04-15 DIAGNOSIS — G4700 Insomnia, unspecified: Secondary | ICD-10-CM | POA: Diagnosis not present

## 2022-04-15 DIAGNOSIS — F4329 Adjustment disorder with other symptoms: Secondary | ICD-10-CM | POA: Diagnosis not present

## 2022-04-15 DIAGNOSIS — G621 Alcoholic polyneuropathy: Secondary | ICD-10-CM | POA: Diagnosis not present

## 2022-04-15 DIAGNOSIS — E43 Unspecified severe protein-calorie malnutrition: Secondary | ICD-10-CM | POA: Diagnosis not present

## 2022-04-15 DIAGNOSIS — F1021 Alcohol dependence, in remission: Secondary | ICD-10-CM | POA: Diagnosis not present

## 2022-04-15 DIAGNOSIS — F341 Dysthymic disorder: Secondary | ICD-10-CM | POA: Diagnosis not present

## 2022-04-15 DIAGNOSIS — F419 Anxiety disorder, unspecified: Secondary | ICD-10-CM | POA: Diagnosis not present

## 2022-04-15 DIAGNOSIS — F1994 Other psychoactive substance use, unspecified with psychoactive substance-induced mood disorder: Secondary | ICD-10-CM | POA: Diagnosis not present

## 2022-04-15 DIAGNOSIS — Z760 Encounter for issue of repeat prescription: Secondary | ICD-10-CM | POA: Diagnosis not present

## 2022-04-15 DIAGNOSIS — F1211 Cannabis abuse, in remission: Secondary | ICD-10-CM | POA: Diagnosis not present

## 2022-04-15 DIAGNOSIS — F1020 Alcohol dependence, uncomplicated: Secondary | ICD-10-CM

## 2022-04-15 DIAGNOSIS — Z79899 Other long term (current) drug therapy: Secondary | ICD-10-CM | POA: Diagnosis not present

## 2022-04-15 DIAGNOSIS — F17218 Nicotine dependence, cigarettes, with other nicotine-induced disorders: Secondary | ICD-10-CM | POA: Diagnosis not present

## 2022-04-15 DIAGNOSIS — F909 Attention-deficit hyperactivity disorder, unspecified type: Secondary | ICD-10-CM | POA: Diagnosis not present

## 2022-04-15 DIAGNOSIS — Z634 Disappearance and death of family member: Secondary | ICD-10-CM | POA: Diagnosis not present

## 2022-04-15 DIAGNOSIS — F1320 Sedative, hypnotic or anxiolytic dependence, uncomplicated: Secondary | ICD-10-CM | POA: Diagnosis not present

## 2022-04-15 DIAGNOSIS — F4312 Post-traumatic stress disorder, chronic: Secondary | ICD-10-CM | POA: Diagnosis not present
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
## 2022-04-18 ENCOUNTER — Encounter (HOSPITAL_COMMUNITY): Payer: Self-pay | Admitting: Licensed Clinical Social Worker

## 2022-04-18 ENCOUNTER — Other Ambulatory Visit (HOSPITAL_COMMUNITY): Payer: BC Managed Care – PPO | Admitting: Licensed Clinical Social Worker

## 2022-04-18 DIAGNOSIS — F341 Dysthymic disorder: Secondary | ICD-10-CM | POA: Diagnosis not present

## 2022-04-18 DIAGNOSIS — F17218 Nicotine dependence, cigarettes, with other nicotine-induced disorders: Secondary | ICD-10-CM | POA: Diagnosis not present

## 2022-04-18 DIAGNOSIS — F4312 Post-traumatic stress disorder, chronic: Secondary | ICD-10-CM | POA: Diagnosis not present

## 2022-04-18 DIAGNOSIS — Z634 Disappearance and death of family member: Secondary | ICD-10-CM | POA: Diagnosis not present

## 2022-04-18 DIAGNOSIS — G621 Alcoholic polyneuropathy: Secondary | ICD-10-CM | POA: Diagnosis not present

## 2022-04-18 DIAGNOSIS — F909 Attention-deficit hyperactivity disorder, unspecified type: Secondary | ICD-10-CM | POA: Diagnosis not present

## 2022-04-18 DIAGNOSIS — Z760 Encounter for issue of repeat prescription: Secondary | ICD-10-CM | POA: Diagnosis not present

## 2022-04-18 DIAGNOSIS — F17219 Nicotine dependence, cigarettes, with unspecified nicotine-induced disorders: Secondary | ICD-10-CM

## 2022-04-18 DIAGNOSIS — E43 Unspecified severe protein-calorie malnutrition: Secondary | ICD-10-CM

## 2022-04-18 DIAGNOSIS — F1320 Sedative, hypnotic or anxiolytic dependence, uncomplicated: Secondary | ICD-10-CM

## 2022-04-18 DIAGNOSIS — F1021 Alcohol dependence, in remission: Secondary | ICD-10-CM | POA: Diagnosis not present

## 2022-04-18 DIAGNOSIS — F1020 Alcohol dependence, uncomplicated: Secondary | ICD-10-CM

## 2022-04-18 DIAGNOSIS — F419 Anxiety disorder, unspecified: Secondary | ICD-10-CM

## 2022-04-18 DIAGNOSIS — F1994 Other psychoactive substance use, unspecified with psychoactive substance-induced mood disorder: Secondary | ICD-10-CM

## 2022-04-18 DIAGNOSIS — Z8669 Personal history of other diseases of the nervous system and sense organs: Secondary | ICD-10-CM

## 2022-04-18 DIAGNOSIS — F4329 Adjustment disorder with other symptoms: Secondary | ICD-10-CM | POA: Diagnosis not present

## 2022-04-18 DIAGNOSIS — Z8659 Personal history of other mental and behavioral disorders: Secondary | ICD-10-CM

## 2022-04-18 DIAGNOSIS — Z79899 Other long term (current) drug therapy: Secondary | ICD-10-CM | POA: Diagnosis not present

## 2022-04-18 DIAGNOSIS — G4700 Insomnia, unspecified: Secondary | ICD-10-CM | POA: Diagnosis not present

## 2022-04-18 DIAGNOSIS — F1211 Cannabis abuse, in remission: Secondary | ICD-10-CM | POA: Diagnosis not present

## 2022-04-18 DIAGNOSIS — F1210 Cannabis abuse, uncomplicated: Secondary | ICD-10-CM

## 2022-04-18 DIAGNOSIS — Z91410 Personal history of adult physical and sexual abuse: Secondary | ICD-10-CM

## 2022-04-18 NOTE — Progress Notes (Signed)
Daily Group Progress Note  Program: CD IOP   Group Time: 9 a.m. to 12 p.m.   Type of Therapy: Process and Psychoeducational   Topic: The therapist checks in with group members, assesses for SI/HI/psychosis and overall level of functioning. The therapist inquires about sobriety date and number of community support meetings attended since last session. The therapist introduces a new member to group and shares information on the risks associated with [place] 8. He also shows a video that illustrates that there are no such things as a "soft" or "hard" drug as the brain cannot tell the difference between them. He discusses process addictions such as gambling or sex addictions and talks about Gamblers' Anonymous. He covers Matrix Module RP17, "Take Care of Yourself."  Summary: Kimberly [name] presents for group rating her depression as a "2" and anxiety as a "6" with no SI or HI. She introduces herself to the new group member telling her story of how she came to be in SA IOP saying that she had drunk non-stop since age 36. When she was admitted to the hospital for eight days prior to going to treatment at [facility], she was drinking in the hospital and hiding her bottles by wrapping them up and putting them at the bottom of the trash can. She recently returned to [employer] and served on a recovery panel. As Kimberly [name] has expressed some interest in working in the substance use field, the therapist talks to her about what it takes to become a LCAS, a CSAC, or a Transport planner with [employer] researching the latter noting that there are classes in [place]. In discussing the module on "Taking Care of Yourself," she admits that she needs to cut back on her caffeine and needs to start exercising via doing some trail walking. As she has not seen a dentist in approximately two years, she says that she needs to do this but must purchase her own dental insurance as her husband let theirs lapse due to having  purchased a 70K truck. She says that she has been spending more but notes that it has been on clothes as she is dressing much nicer at meetings so this could fall into the category of self-care or getting a new look. She schedules individual appointments with this therapist for aftercare once she is done with SA IOP.    Progress Towards Goals: Kimberly [name] reports no alcohol use.   UDS collected: Yes  Results: Yes; negative for alcohol.  AA/NA attended?: Yes  Sponsor?: Yes   Adam Phenix, [place], [place], [facility], [place] 04/18/2022
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
## 2022-04-20 ENCOUNTER — Other Ambulatory Visit (HOSPITAL_COMMUNITY): Payer: BC Managed Care – PPO | Admitting: Licensed Clinical Social Worker

## 2022-04-20 DIAGNOSIS — F1994 Other psychoactive substance use, unspecified with psychoactive substance-induced mood disorder: Secondary | ICD-10-CM | POA: Diagnosis not present

## 2022-04-20 DIAGNOSIS — G621 Alcoholic polyneuropathy: Secondary | ICD-10-CM | POA: Diagnosis not present

## 2022-04-20 DIAGNOSIS — Z634 Disappearance and death of family member: Secondary | ICD-10-CM | POA: Diagnosis not present

## 2022-04-20 DIAGNOSIS — F341 Dysthymic disorder: Secondary | ICD-10-CM | POA: Diagnosis not present

## 2022-04-20 DIAGNOSIS — F4312 Post-traumatic stress disorder, chronic: Secondary | ICD-10-CM | POA: Diagnosis not present

## 2022-04-20 DIAGNOSIS — F17218 Nicotine dependence, cigarettes, with other nicotine-induced disorders: Secondary | ICD-10-CM | POA: Diagnosis not present

## 2022-04-20 DIAGNOSIS — E43 Unspecified severe protein-calorie malnutrition: Secondary | ICD-10-CM | POA: Diagnosis not present

## 2022-04-20 DIAGNOSIS — F1211 Cannabis abuse, in remission: Secondary | ICD-10-CM | POA: Diagnosis not present

## 2022-04-20 DIAGNOSIS — Z79899 Other long term (current) drug therapy: Secondary | ICD-10-CM | POA: Diagnosis not present

## 2022-04-20 DIAGNOSIS — F4329 Adjustment disorder with other symptoms: Secondary | ICD-10-CM | POA: Diagnosis not present

## 2022-04-20 DIAGNOSIS — G4700 Insomnia, unspecified: Secondary | ICD-10-CM | POA: Diagnosis not present

## 2022-04-20 DIAGNOSIS — F1320 Sedative, hypnotic or anxiolytic dependence, uncomplicated: Secondary | ICD-10-CM | POA: Diagnosis not present

## 2022-04-20 DIAGNOSIS — F1021 Alcohol dependence, in remission: Secondary | ICD-10-CM | POA: Diagnosis not present

## 2022-04-20 DIAGNOSIS — F1020 Alcohol dependence, uncomplicated: Secondary | ICD-10-CM

## 2022-04-20 DIAGNOSIS — F909 Attention-deficit hyperactivity disorder, unspecified type: Secondary | ICD-10-CM | POA: Diagnosis not present

## 2022-04-20 DIAGNOSIS — F419 Anxiety disorder, unspecified: Secondary | ICD-10-CM | POA: Diagnosis not present

## 2022-04-20 DIAGNOSIS — Z760 Encounter for issue of repeat prescription: Secondary | ICD-10-CM | POA: Diagnosis not present

## 2022-04-20 NOTE — Progress Notes (Signed)
Daily Group Progress Note  Program: CD IOP   Group Time: 9:20 a.m. to 12 p.m.   Type of Therapy: Process and Psychoeducational   Topic: The therapist checks in with group members, assesses for SI/HI/psychosis and overall level of functioning. The therapist inquires about sobriety date and number of community support meetings attended since last session. The therapist provides group members with a handout with character defects and corresponding character assets. The therapist has the group members watch a video on the link between character defects and addiction and facilitates a discussion on this topic. As the video mentions the [facility], the therapist provides a brief explanation of the Enneagram and allows a few group members to take a short, Enneagram type test. The therapist covers Matrix module RP 18 Emotional Triggers.   Summary: Kimberly [name] presents for group about twenty minutes late rating her depression as a "3" and anxiety as a "3" with no SI or HI. She talks about having recently been "pissed off" at her Sponsor who told Koriana that she wanted to reschedule doing her 5th Step on Friday as she was attending a dinner to celebrate a guy in recovery's one year of sobriety. The therapist validates Anelle's feelings especially as she has had this scheduled for some time and may it clear repeatedly how much she wants to get this behind her. Also, her Sponsor likely put Rogue on the backburner as he is romantically interested in the guy for whom the dinner is being held. Jessi did speak up to her Sponsor and got her 5th Step moved to Thursday i.e. tomorrow. During today's group discussion, Anihya notes that feeling lonely and bored are her biggest emotional triggers to use so she is working to keep her schedule full so as to no succumb to these emotions. Maddox answers questions from other group members who have not started step work on how to work a fourth step.   Progress Towards Goals:  Chaslyn reports no alcohol use.     UDS collected: No  Results: No  AA/NA attended?: Yes  Sponsor?: Yes   Kimberly [name], [place], [place], [facility], [place] 04/20/2022

## 2022-04-22 ENCOUNTER — Encounter (HOSPITAL_COMMUNITY): Payer: BC Managed Care – PPO

## 2022-04-22 ENCOUNTER — Telehealth (HOSPITAL_COMMUNITY): Payer: Self-pay | Admitting: Licensed Clinical Social Worker

## 2022-04-22 NOTE — Telephone Encounter (Signed)
The therapist receives a return call from [place] for missing group saying that she ended up sleeping late as she did not finish her 5th Step with her Sponsor until after 11 p.m.  Adam Phenix, [place], LCSW, [facility], [place] 04/22/2022

## 2022-04-22 NOTE — Telephone Encounter (Signed)
The therapist calls [facility] leaving a HIPAA-compliant voicemail as she did not show for group and did not send an email or leave a voicemail as she has customarily done in the past.  Adam Phenix, [place], [place], [facility], [place] 04/22/2022

## 2022-04-25 ENCOUNTER — Other Ambulatory Visit (HOSPITAL_COMMUNITY): Payer: BC Managed Care – PPO | Admitting: Licensed Clinical Social Worker

## 2022-04-25 DIAGNOSIS — F1994 Other psychoactive substance use, unspecified with psychoactive substance-induced mood disorder: Secondary | ICD-10-CM | POA: Diagnosis not present

## 2022-04-25 DIAGNOSIS — G4700 Insomnia, unspecified: Secondary | ICD-10-CM | POA: Diagnosis not present

## 2022-04-25 DIAGNOSIS — F419 Anxiety disorder, unspecified: Secondary | ICD-10-CM | POA: Diagnosis not present

## 2022-04-25 DIAGNOSIS — F341 Dysthymic disorder: Secondary | ICD-10-CM | POA: Diagnosis not present

## 2022-04-25 DIAGNOSIS — F17218 Nicotine dependence, cigarettes, with other nicotine-induced disorders: Secondary | ICD-10-CM | POA: Diagnosis not present

## 2022-04-25 DIAGNOSIS — Z79899 Other long term (current) drug therapy: Secondary | ICD-10-CM | POA: Diagnosis not present

## 2022-04-25 DIAGNOSIS — F1020 Alcohol dependence, uncomplicated: Secondary | ICD-10-CM

## 2022-04-25 DIAGNOSIS — G621 Alcoholic polyneuropathy: Secondary | ICD-10-CM | POA: Diagnosis not present

## 2022-04-25 DIAGNOSIS — F1021 Alcohol dependence, in remission: Secondary | ICD-10-CM | POA: Diagnosis not present

## 2022-04-25 DIAGNOSIS — Z760 Encounter for issue of repeat prescription: Secondary | ICD-10-CM | POA: Diagnosis not present

## 2022-04-25 DIAGNOSIS — F4312 Post-traumatic stress disorder, chronic: Secondary | ICD-10-CM | POA: Diagnosis not present

## 2022-04-25 DIAGNOSIS — E43 Unspecified severe protein-calorie malnutrition: Secondary | ICD-10-CM | POA: Diagnosis not present

## 2022-04-25 DIAGNOSIS — F4329 Adjustment disorder with other symptoms: Secondary | ICD-10-CM | POA: Diagnosis not present

## 2022-04-25 DIAGNOSIS — F909 Attention-deficit hyperactivity disorder, unspecified type: Secondary | ICD-10-CM | POA: Diagnosis not present

## 2022-04-25 DIAGNOSIS — Z634 Disappearance and death of family member: Secondary | ICD-10-CM | POA: Diagnosis not present

## 2022-04-25 DIAGNOSIS — F1211 Cannabis abuse, in remission: Secondary | ICD-10-CM | POA: Diagnosis not present

## 2022-04-25 DIAGNOSIS — F1320 Sedative, hypnotic or anxiolytic dependence, uncomplicated: Secondary | ICD-10-CM | POA: Diagnosis not present

## 2022-04-25 NOTE — Progress Notes (Signed)
Daily Group Progress Note  Program: CD IOP   Group Time: 9 a.m. to 12 p.m.   Type of Therapy: Process and Psychoeducational   Topic: The therapist checks in with group members, assesses for SI/HI/psychosis and overall level of functioning. The therapist inquires about sobriety date and number of community support meetings attended since last session. The therapist has group members watch and discuss two videos; one on the stages of relapse and the other on how addiction requires dishonest. The therapist covers Matrix Module RP 19 on Illness and RP 20 on Recognizing Stress. The major focus of the group involves a discussion on how addiction is a form of maladaptive problem-solving aimed at helping people cope with difficult emotions. The therapist discusses how one healthy way that people cope with difficult emotions is by sharing them with someone who they trust with addicts typically having trust issues when it comes to other people.   Summary: Jeriah presents for group rating her depression as a "3" and anxiety as a "3" with no SI or HI. She says that she completed her 5th Step with her Sponsor last week; however, does not feel like she was able to cover everything. In covering RP 19, she indicates that illness has never been a trigger for relapse for her; however, she has almost all the somatic symptoms associated with excessive levels of stress. She says that Saturday was the anniversary of her mother's death and that this hit her harder than usual. She had a party at a bowling alley for Father's Day and was upset that her stepson drank a beer at this event. The therapist notes that Ebany held the event at a venue where people drink and did not clarify that the event was a non-drinking event. Gelsey admits that she only trusts two people who are this therapist and her Sponsor with the therapist pointing out that with seven billion people in the world that hopefully she will find more people to  trust. The therapist notes even the most trusting of relationships will have challenges as we all have character defects.   Progress Towards Goals: Pricella reports no alcohol use     UDS collected: Yes  Results: Yes; negative for alcohol.   AA/NA attended?: Yes  Sponsor?: Yes  Individual Time: 12 p.m. to 1 p.m.   The therapist meets with Delina for an individual session in which she admits that she has concluded that she does not want to continue in her marriage becoming tearful in talking about how she is "scared." She says that if she were to stay with her husband that the only reason would be due to a fear of being on her own. She also admits that she worries about how her kids will see her. 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   The therapist recommends that she approach her goal of getting out of her marriage and on her own the same way she does recover which is taking things one step at a time. He normalizes her fears given that she has never lived on her own but voices his confidence that she can do this if she can make enough to be financially independent which is is working on at present.    Adam Phenix, [place], LCSW, [facility], [place] 04/25/2022

## 2022-04-29 ENCOUNTER — Ambulatory Visit (HOSPITAL_COMMUNITY): Payer: BC Managed Care – PPO

## 2022-04-29 ENCOUNTER — Other Ambulatory Visit (HOSPITAL_COMMUNITY): Payer: BC Managed Care – PPO | Admitting: Licensed Clinical Social Worker

## 2022-04-29 DIAGNOSIS — F1020 Alcohol dependence, uncomplicated: Secondary | ICD-10-CM

## 2022-04-29 DIAGNOSIS — Z760 Encounter for issue of repeat prescription: Secondary | ICD-10-CM | POA: Diagnosis not present

## 2022-04-29 DIAGNOSIS — F1211 Cannabis abuse, in remission: Secondary | ICD-10-CM | POA: Diagnosis not present

## 2022-04-29 DIAGNOSIS — F909 Attention-deficit hyperactivity disorder, unspecified type: Secondary | ICD-10-CM | POA: Diagnosis not present

## 2022-04-29 DIAGNOSIS — F341 Dysthymic disorder: Secondary | ICD-10-CM | POA: Diagnosis not present

## 2022-04-29 DIAGNOSIS — E43 Unspecified severe protein-calorie malnutrition: Secondary | ICD-10-CM | POA: Diagnosis not present

## 2022-04-29 DIAGNOSIS — Z79899 Other long term (current) drug therapy: Secondary | ICD-10-CM | POA: Diagnosis not present

## 2022-04-29 DIAGNOSIS — F1320 Sedative, hypnotic or anxiolytic dependence, uncomplicated: Secondary | ICD-10-CM | POA: Diagnosis not present

## 2022-04-29 DIAGNOSIS — F1994 Other psychoactive substance use, unspecified with psychoactive substance-induced mood disorder: Secondary | ICD-10-CM | POA: Diagnosis not present

## 2022-04-29 DIAGNOSIS — F4329 Adjustment disorder with other symptoms: Secondary | ICD-10-CM | POA: Diagnosis not present

## 2022-04-29 DIAGNOSIS — F1021 Alcohol dependence, in remission: Secondary | ICD-10-CM | POA: Diagnosis not present

## 2022-04-29 DIAGNOSIS — G621 Alcoholic polyneuropathy: Secondary | ICD-10-CM | POA: Diagnosis not present

## 2022-04-29 DIAGNOSIS — G4700 Insomnia, unspecified: Secondary | ICD-10-CM | POA: Diagnosis not present

## 2022-04-29 DIAGNOSIS — F17218 Nicotine dependence, cigarettes, with other nicotine-induced disorders: Secondary | ICD-10-CM | POA: Diagnosis not present

## 2022-04-29 DIAGNOSIS — Z634 Disappearance and death of family member: Secondary | ICD-10-CM | POA: Diagnosis not present

## 2022-04-29 DIAGNOSIS — F4312 Post-traumatic stress disorder, chronic: Secondary | ICD-10-CM | POA: Diagnosis not present

## 2022-04-29 DIAGNOSIS — F419 Anxiety disorder, unspecified: Secondary | ICD-10-CM | POA: Diagnosis not present

## 2022-05-02 ENCOUNTER — Other Ambulatory Visit (HOSPITAL_COMMUNITY): Payer: BC Managed Care – PPO | Admitting: Licensed Clinical Social Worker

## 2022-05-02 DIAGNOSIS — F17218 Nicotine dependence, cigarettes, with other nicotine-induced disorders: Secondary | ICD-10-CM | POA: Diagnosis not present

## 2022-05-02 DIAGNOSIS — F1994 Other psychoactive substance use, unspecified with psychoactive substance-induced mood disorder: Secondary | ICD-10-CM | POA: Diagnosis not present

## 2022-05-02 DIAGNOSIS — Z634 Disappearance and death of family member: Secondary | ICD-10-CM | POA: Diagnosis not present

## 2022-05-02 DIAGNOSIS — F419 Anxiety disorder, unspecified: Secondary | ICD-10-CM | POA: Diagnosis not present

## 2022-05-02 DIAGNOSIS — F341 Dysthymic disorder: Secondary | ICD-10-CM | POA: Diagnosis not present

## 2022-05-02 DIAGNOSIS — F1320 Sedative, hypnotic or anxiolytic dependence, uncomplicated: Secondary | ICD-10-CM | POA: Diagnosis not present

## 2022-05-02 DIAGNOSIS — F909 Attention-deficit hyperactivity disorder, unspecified type: Secondary | ICD-10-CM | POA: Diagnosis not present

## 2022-05-02 DIAGNOSIS — E43 Unspecified severe protein-calorie malnutrition: Secondary | ICD-10-CM | POA: Diagnosis not present

## 2022-05-02 DIAGNOSIS — F4312 Post-traumatic stress disorder, chronic: Secondary | ICD-10-CM | POA: Diagnosis not present

## 2022-05-02 DIAGNOSIS — F1211 Cannabis abuse, in remission: Secondary | ICD-10-CM | POA: Diagnosis not present

## 2022-05-02 DIAGNOSIS — G4700 Insomnia, unspecified: Secondary | ICD-10-CM | POA: Diagnosis not present

## 2022-05-02 DIAGNOSIS — F1020 Alcohol dependence, uncomplicated: Secondary | ICD-10-CM

## 2022-05-02 DIAGNOSIS — Z760 Encounter for issue of repeat prescription: Secondary | ICD-10-CM | POA: Diagnosis not present

## 2022-05-02 DIAGNOSIS — G621 Alcoholic polyneuropathy: Secondary | ICD-10-CM | POA: Diagnosis not present

## 2022-05-02 DIAGNOSIS — F1021 Alcohol dependence, in remission: Secondary | ICD-10-CM | POA: Diagnosis not present

## 2022-05-02 DIAGNOSIS — F4329 Adjustment disorder with other symptoms: Secondary | ICD-10-CM | POA: Diagnosis not present

## 2022-05-02 DIAGNOSIS — Z79899 Other long term (current) drug therapy: Secondary | ICD-10-CM | POA: Diagnosis not present

## 2022-05-03 ENCOUNTER — Other Ambulatory Visit (HOSPITAL_COMMUNITY): Payer: Self-pay | Admitting: Medical

## 2022-05-04 ENCOUNTER — Telehealth (HOSPITAL_COMMUNITY): Payer: Self-pay | Admitting: Licensed Clinical Social Worker

## 2022-05-04 ENCOUNTER — Encounter (HOSPITAL_COMMUNITY): Payer: BC Managed Care – PPO | Admitting: Medical

## 2022-05-04 NOTE — Telephone Encounter (Signed)
The therapist calls Kimberly [name] confirming her identify via two identifiers. She says that she told the therapist that she may not be in group today due to a dentist's appointment but says that she forgot to call last night.   She confirms her remaining IOP appointments and the date that she starts individual therapy.   Adam Phenix, [place], LCSW, [facility], [place] 05/04/2022

## 2022-05-05 NOTE — Telephone Encounter (Signed)
Refill #[id number] no RF

## 2022-05-06 ENCOUNTER — Other Ambulatory Visit (HOSPITAL_COMMUNITY): Payer: BC Managed Care – PPO | Admitting: Licensed Clinical Social Worker

## 2022-05-06 DIAGNOSIS — F1021 Alcohol dependence, in remission: Secondary | ICD-10-CM | POA: Diagnosis not present

## 2022-05-06 DIAGNOSIS — F17218 Nicotine dependence, cigarettes, with other nicotine-induced disorders: Secondary | ICD-10-CM | POA: Diagnosis not present

## 2022-05-06 DIAGNOSIS — F909 Attention-deficit hyperactivity disorder, unspecified type: Secondary | ICD-10-CM | POA: Diagnosis not present

## 2022-05-06 DIAGNOSIS — F1994 Other psychoactive substance use, unspecified with psychoactive substance-induced mood disorder: Secondary | ICD-10-CM | POA: Diagnosis not present

## 2022-05-06 DIAGNOSIS — Z79899 Other long term (current) drug therapy: Secondary | ICD-10-CM | POA: Diagnosis not present

## 2022-05-06 DIAGNOSIS — F1211 Cannabis abuse, in remission: Secondary | ICD-10-CM | POA: Diagnosis not present

## 2022-05-06 DIAGNOSIS — F341 Dysthymic disorder: Secondary | ICD-10-CM | POA: Diagnosis not present

## 2022-05-06 DIAGNOSIS — E43 Unspecified severe protein-calorie malnutrition: Secondary | ICD-10-CM | POA: Diagnosis not present

## 2022-05-06 DIAGNOSIS — F4329 Adjustment disorder with other symptoms: Secondary | ICD-10-CM | POA: Diagnosis not present

## 2022-05-06 DIAGNOSIS — Z634 Disappearance and death of family member: Secondary | ICD-10-CM | POA: Diagnosis not present

## 2022-05-06 DIAGNOSIS — G621 Alcoholic polyneuropathy: Secondary | ICD-10-CM | POA: Diagnosis not present

## 2022-05-06 DIAGNOSIS — Z760 Encounter for issue of repeat prescription: Secondary | ICD-10-CM | POA: Diagnosis not present

## 2022-05-06 DIAGNOSIS — F1320 Sedative, hypnotic or anxiolytic dependence, uncomplicated: Secondary | ICD-10-CM | POA: Diagnosis not present

## 2022-05-06 DIAGNOSIS — F4312 Post-traumatic stress disorder, chronic: Secondary | ICD-10-CM | POA: Diagnosis not present

## 2022-05-06 DIAGNOSIS — G4700 Insomnia, unspecified: Secondary | ICD-10-CM | POA: Diagnosis not present

## 2022-05-06 DIAGNOSIS — F1020 Alcohol dependence, uncomplicated: Secondary | ICD-10-CM

## 2022-05-06 DIAGNOSIS — F419 Anxiety disorder, unspecified: Secondary | ICD-10-CM | POA: Diagnosis not present

## 2022-05-06 NOTE — Progress Notes (Signed)
Daily Group Progress Note  Program: CD IOP   Group Time: 9 a.m. to 11:15 a.m.  Type of Therapy: Process and Psychoeducational   Topic: The therapist checks in with group members, assesses for SI/HI/psychosis and overall level of functioning. The therapist inquires about sobriety date and number of community support meetings attended since last session. The therapist facilities a discussion on several topics including medication assisted treatment; spontaneous remission; the way in which the same disease, addiction, manifests differently in different people; and the family dynamics of addiction with a focus on co-dependency. The therapist also talks about the July 4th Holiday as being a trigger for many to use drugs and alcohol.   Summary: Kimberly [name] presents for group rating her depression as a "3" and anxiety as a "4" with no SI or HI. Her main focus in group today involves not being able to get her Vivitrol shot and how much this is bothering her. She answers another group member's questions about Vivitrol.   Kimberly [name] describes it as being like "birth control" but for drinking. Kimberly [name] says that having the shot gives her the security of knowing that she will not get the euphoria from drinking which helps to prevent her from doing so.   She says that she is going to start sitting with her Aunt S__ three days a week as she is paying for [place]. She says that her Elenor Legato is treating her much better now.  Kimberly [name] believes she may have completed patient assistance paperwork for her Vivitrol shot saying that it will be shipped to this office but that someone must sign for it.  She has to leave group early today due to an appointment.   Progress Towards Goals: Kimberly [name] denies any alcohol use.   UDS collected: No Results: No  AA/NA attended?: Yes  Sponsor?: Yes   Adam Phenix, [place], [place], [facility], [place] 05/06/2022

## 2022-05-09 ENCOUNTER — Other Ambulatory Visit (HOSPITAL_COMMUNITY): Payer: BC Managed Care – PPO | Attending: Psychiatry | Admitting: Licensed Clinical Social Worker

## 2022-05-09 DIAGNOSIS — F17219 Nicotine dependence, cigarettes, with unspecified nicotine-induced disorders: Secondary | ICD-10-CM | POA: Diagnosis not present

## 2022-05-09 DIAGNOSIS — F1320 Sedative, hypnotic or anxiolytic dependence, uncomplicated: Secondary | ICD-10-CM | POA: Insufficient documentation

## 2022-05-09 DIAGNOSIS — F419 Anxiety disorder, unspecified: Secondary | ICD-10-CM | POA: Insufficient documentation

## 2022-05-09 DIAGNOSIS — G621 Alcoholic polyneuropathy: Secondary | ICD-10-CM | POA: Diagnosis not present

## 2022-05-09 DIAGNOSIS — Z91410 Personal history of adult physical and sexual abuse: Secondary | ICD-10-CM | POA: Diagnosis not present

## 2022-05-09 DIAGNOSIS — E43 Unspecified severe protein-calorie malnutrition: Secondary | ICD-10-CM | POA: Insufficient documentation

## 2022-05-09 DIAGNOSIS — F32A Depression, unspecified: Secondary | ICD-10-CM | POA: Insufficient documentation

## 2022-05-09 DIAGNOSIS — Z760 Encounter for issue of repeat prescription: Secondary | ICD-10-CM | POA: Diagnosis not present

## 2022-05-09 DIAGNOSIS — F1020 Alcohol dependence, uncomplicated: Secondary | ICD-10-CM

## 2022-05-09 DIAGNOSIS — F17218 Nicotine dependence, cigarettes, with other nicotine-induced disorders: Secondary | ICD-10-CM | POA: Diagnosis not present

## 2022-05-09 DIAGNOSIS — F4312 Post-traumatic stress disorder, chronic: Secondary | ICD-10-CM | POA: Diagnosis not present

## 2022-05-09 DIAGNOSIS — F1021 Alcohol dependence, in remission: Secondary | ICD-10-CM | POA: Diagnosis not present

## 2022-05-09 DIAGNOSIS — F1210 Cannabis abuse, uncomplicated: Secondary | ICD-10-CM | POA: Diagnosis not present

## 2022-05-09 NOTE — Progress Notes (Signed)
Daily Group Progress Note  Program: CD IOP   Group Time: 9 a.m. to 12 p.m.  Type of Therapy: Process and Psychoeducational   Topic: The therapist checks in with group members, assesses for SI/HI/psychosis and overall level of functioning. The therapist inquires about sobriety date and number of community support meetings attended since last session. The therapist facilities a discussion on persons with dual diagnosis and the need for integrated treatment. The therapist also focuses on the importance of a person in recovery checking in each day to see how he or she is doing emotionally so as to offset emotional relapse. The therapist notes that keeping a daily journal is a good way to do this an uncover possible precipitants for feelings of anxiety, anger, depression, etc.  Starting at 11 a.m. and for the duration of group, the group has a guest speaker, Ms. Jacques Navy with [facility] who speaks on the topic of self-care focusing on nutrition, movement, and sleep.   Summary: Kimberly [name] presents for group rating her depression as a "3" and anxiety as a "3" with no SI or HI.   She says that she has ADHD and that Concerta is what worked best for her in the past; however, given that she is in recovery that she is hesitant about taking a stimulant even though she never overused her Concerta. She was on Wellbutrin but it caused severe headaches. The therapist makes her aware of Strattera as a possible option.   She is very engaged in the discussion about self-care with a focus on her excessive caffeine consumption.   Progress Towards Goals: Kimberly [name] denies any alcohol use.   UDS collected: Yes  Results: No  AA/NA attended?: Yes  Sponsor?: Yes   Adam Phenix, [place], [place], [facility], [place] 05/09/2022

## 2022-05-11 ENCOUNTER — Encounter (HOSPITAL_COMMUNITY): Payer: Self-pay | Admitting: Medical

## 2022-05-11 ENCOUNTER — Other Ambulatory Visit (HOSPITAL_BASED_OUTPATIENT_CLINIC_OR_DEPARTMENT_OTHER): Payer: BC Managed Care – PPO | Admitting: Licensed Clinical Social Worker

## 2022-05-11 DIAGNOSIS — F1210 Cannabis abuse, uncomplicated: Secondary | ICD-10-CM | POA: Diagnosis not present

## 2022-05-11 DIAGNOSIS — F419 Anxiety disorder, unspecified: Secondary | ICD-10-CM | POA: Diagnosis not present

## 2022-05-11 DIAGNOSIS — F32A Depression, unspecified: Secondary | ICD-10-CM | POA: Diagnosis not present

## 2022-05-11 DIAGNOSIS — F4329 Adjustment disorder with other symptoms: Secondary | ICD-10-CM

## 2022-05-11 DIAGNOSIS — Z91410 Personal history of adult physical and sexual abuse: Secondary | ICD-10-CM | POA: Diagnosis not present

## 2022-05-11 DIAGNOSIS — F1021 Alcohol dependence, in remission: Secondary | ICD-10-CM

## 2022-05-11 DIAGNOSIS — G621 Alcoholic polyneuropathy: Secondary | ICD-10-CM | POA: Diagnosis not present

## 2022-05-11 DIAGNOSIS — F4312 Post-traumatic stress disorder, chronic: Secondary | ICD-10-CM

## 2022-05-11 DIAGNOSIS — F17218 Nicotine dependence, cigarettes, with other nicotine-induced disorders: Secondary | ICD-10-CM | POA: Diagnosis not present

## 2022-05-11 DIAGNOSIS — E43 Unspecified severe protein-calorie malnutrition: Secondary | ICD-10-CM | POA: Diagnosis not present

## 2022-05-11 DIAGNOSIS — Z8659 Personal history of other mental and behavioral disorders: Secondary | ICD-10-CM

## 2022-05-11 DIAGNOSIS — F17219 Nicotine dependence, cigarettes, with unspecified nicotine-induced disorders: Secondary | ICD-10-CM

## 2022-05-11 DIAGNOSIS — F1320 Sedative, hypnotic or anxiolytic dependence, uncomplicated: Secondary | ICD-10-CM

## 2022-05-11 DIAGNOSIS — Z760 Encounter for issue of repeat prescription: Secondary | ICD-10-CM | POA: Diagnosis not present

## 2022-05-11 DIAGNOSIS — F1994 Other psychoactive substance use, unspecified with psychoactive substance-induced mood disorder: Secondary | ICD-10-CM

## 2022-05-11 DIAGNOSIS — Z8669 Personal history of other diseases of the nervous system and sense organs: Secondary | ICD-10-CM

## 2022-05-11 MED ORDER — PREGABALIN 200 MG PO CAPS: 200.0000 mg | ORAL_CAPSULE | Freq: Three times a day (TID) | ORAL | 2 refills | Status: DC

## 2022-05-11 MED ORDER — BUSPIRONE HCL 30 MG PO TABS: 30.0000 mg | ORAL_TABLET | Freq: Two times a day (BID) | ORAL | 2 refills | Status: AC

## 2022-05-11 NOTE — Progress Notes (Signed)
Daily Group Progress Note  Program: CD IOP   Group Time: 9 a.m. to 12 p.m.  Type of Therapy: Process and Psychoeducational   Topic: The therapist checks in with group members, assesses for SI/HI/psychosis and overall level of functioning. The therapist inquires about sobriety date and number of community support meetings attended since last session. The therapist facilities discussions on medication assisted treatment, how to deal with the stigma of addiction, the inherent problems with dual relationships, and the importance of discussing problems that group members are having in their lives that cause them to feel stress as a means of preventing emotional relapse. The therapist observes that as people recover that their lives become full of more productive activity which in turn can create more stress.     Summary: Analiza presents for group rating her depression as a "3" and anxiety as a "3" with no SI or HI.   This is Jazelyn's last SA IOP as she will be seeing this therapist for individual therapy beginning on 05/19/22. She says that she is "burned out" from working 11-12 hour days and misses her kids.   She does say that this work schedule is only temporary as she has to have a certain amount of money saved up for school which is about to start and then will enter the workforce.   She continues to attend two meetings a day for the most part and plans on attending a conference at [employer] in August.  She concludes that she learned a lot from [place] IOP.   Progress Towards Goals: Jazzlin denies any alcohol use.   UDS collected: No  Results: Yes, negative for alcohol or drugs  AA/NA attended?: Yes  Sponsor?: Yes   Adam Phenix, [place], [place], [facility], [place] 05/11/2022
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
## 2022-05-13 ENCOUNTER — Encounter (HOSPITAL_COMMUNITY): Payer: BC Managed Care – PPO

## 2022-05-14 ENCOUNTER — Other Ambulatory Visit (HOSPITAL_COMMUNITY): Payer: Self-pay | Admitting: Medical

## 2022-05-16 ENCOUNTER — Encounter (HOSPITAL_COMMUNITY): Payer: Self-pay | Admitting: Licensed Clinical Social Worker

## 2022-05-19 ENCOUNTER — Encounter (HOSPITAL_COMMUNITY): Payer: Self-pay | Admitting: *Deleted

## 2022-05-19 ENCOUNTER — Ambulatory Visit (INDEPENDENT_AMBULATORY_CARE_PROVIDER_SITE_OTHER): Payer: BC Managed Care – PPO | Admitting: Licensed Clinical Social Worker

## 2022-05-19 ENCOUNTER — Encounter (HOSPITAL_COMMUNITY): Payer: Self-pay

## 2022-05-19 ENCOUNTER — Ambulatory Visit (HOSPITAL_BASED_OUTPATIENT_CLINIC_OR_DEPARTMENT_OTHER): Payer: BC Managed Care – PPO | Admitting: *Deleted

## 2022-05-19 VITALS — BP 118/77 | HR 90 | Resp 18 | Ht 67.0 in | Wt 160.6 lb

## 2022-05-19 DIAGNOSIS — F1021 Alcohol dependence, in remission: Secondary | ICD-10-CM

## 2022-05-19 MED ORDER — NALTREXONE 380 MG IM SUSR
380.0000 mg | Freq: Once | INTRAMUSCULAR | Status: AC
Start: 2022-05-19 — End: 2022-05-19
  Administered 2022-05-19: 380 mg via INTRAMUSCULAR

## 2022-05-19 NOTE — Patient Instructions (Addendum)
Pt presents today for due Vivitrol 380 mg injection. Pt very pleasant, appropriate and cooperative on approach. Injection prepared as ordered and given in [facility] with no complaints. Pt states she is committed to injection and will remain compliant. Denies any s/e from last injection. VSS. Denies si, hi, avh. Pt to return in one month for next due injection. Pt encouraged to call office with any questions or concerns. Pt agrees.

## 2022-05-19 NOTE — Telephone Encounter (Signed)
Approved Baclofen for cravings

## 2022-05-19 NOTE — Progress Notes (Signed)
THERAPIST PROGRESS NOTE  Session Time: 9:05 a.m. 9:58 a.m.  Type of Therapy: Individual   Therapist Response/Interventions: CBT/The therapist talks to Charly about the importance of daily scheduling now that she has so much free time providing her with daily scheduling forms to use.  The therapist explores the reason Evangela still talks to the guy with whom she had an affair pointing out how engaging in this toxic behavior when bored can lead to other toxic behaviors such as drinking which she says makes sense.   As for sleep, the therapist reminds her of AA's "HALT."   Treatment Goals addressed: Sadye will abstain from alcohol 30/30 days per self-report and random breathalyzer by 11/18/2012.   Summary: Jaonna presents for her initial therapy session since completing SA IOP. She has saved the money needed to start school so she is now work Tuesdays, Thursdays, and Saturdays. Her PHQ-9 is a "6" as she notes she is mildly depressed as she is no longer attending SA IOP. She is not taking an anti-depressant.  She says that it is still her plan to leave her husband once she gets a job. She continues to talk to the guy with whom she had an affair but is not sleeping with him. She says that he has been in [place] for 3 weeks but is not in it for the right reasons. She speculates that perhaps she still talks to him as she "gets bored."   After discussing this with the therapist, she recognizes that continuing to talk with him is "toxic."   She is struggling with how she will make evening meetings while having to get up early to get her kids ready for school. She is getting them up early now so is getting about 7.5 hours of sleep versus her usual 9.   Evee and the therapist talk about how she will get a bed, household items, etcetera when she does eventually move out.  Progress Towards Goals: Initial  Suicidal/Homicidal: no SI or HI reported  Plan: Return again in 1 weeks.  Diagnosis: Alcohol  Dependence, Severe in early remission.  Collaboration of Care: Other N/A  Patient/Guardian was advised Release of Information must be obtained prior to any record release in order to collaborate their care with an outside provider. Patient/Guardian was advised if they have not already done so to contact the registration department to sign all necessary forms in order for [name] to release information regarding their care.   Consent: Patient/Guardian gives verbal consent for treatment and assignment of benefits for services provided during this visit. Patient/Guardian expressed understanding and agreed to proceed.   Adam Phenix, [place], LCSW, [facility], [place] 05/19/2022

## 2022-05-19 NOTE — Plan of Care (Signed)
  Problem:  Alcohol Goal:  Pailynn will report no using alcohol 30/30 days out of a month per self-report and random breathalyzers as needed by 11/18/2022.  Outcome: Not Applicable

## 2022-05-26 ENCOUNTER — Ambulatory Visit (INDEPENDENT_AMBULATORY_CARE_PROVIDER_SITE_OTHER): Payer: BC Managed Care – PPO | Admitting: Licensed Clinical Social Worker

## 2022-05-26 DIAGNOSIS — F1021 Alcohol dependence, in remission: Secondary | ICD-10-CM

## 2022-05-26 NOTE — Progress Notes (Signed)
THERAPIST PROGRESS NOTE  Session Time: 9 a.m. to 10 a.m.  Type of Therapy: Individual   Therapist Response/Interventions: CBT/The therapist suggest some alternative ways Kimberly [name] could have handled the situation on Friday modeling how she could have assertively told her husband that his lack of help was upsetting to her as he was the one wanting the house deep cleaned. The therapist also notes that Kimberly [name] could have just stopped cleaning and declined to do so if her husband was not going to help. The therapist states his belief that Kimberly [name] and no other married person is obligated to have sex with his or her spouse to "keep the peace" when the person does not want to have sex.   He normalizes having fear of making changes but reminds Kimberly [name] that she only has to focus on school for now and that her Kimberly [name] will have to deal with the fact that she is not the center of the world. The therapist reminds Kimberly [name] of the reason she stopped working with her Aunt previously.   Treatment Goals addressed: Kimberly [name] will abstain from alcohol 30/30 days per self-report and random breathalyzer by 11/18/2012.   Summary: Kimberly [name] presents saying that her husband is going out of town  to some sort of livestock event out-of-state and has his son and other family watching the farm. She says that they have a beach trip coming up soon and questions if he will have them watch the farm so he can attend or do what he normally does and use the farm as an excuse to not leave. She admits that she would likely be angry if the latter were to occur.   She says, "I lost my shit" last Friday. She was "hyperventilating crying"  so had to call her Sponsor  saying that this episode was brought on by a "variation of things.  Her husband wanted her to clean the house so the A/C man did not not see it looking so messy. He did not help her taking three hours to just fold clothes and her sons kept interrupting. She also was called by her  Kimberly [name] urging her to come back over.   Kimberly [name] talks about her lack of libido and her fear of "failing" when she does finally leave and go out on her own. She says that the libido is a problem as she has to have sex with her husband to "keep the peace." She concludes that her Kimberly [name] will be "pissed" when she returns to school. She says that if she misses a meeting that she gets "very cranky."   She says that she has remained sober but would have been tempted to drink on Friday had she had liquor.  Progress Towards Goals: Progressing  Suicidal/Homicidal: no SI or HI reported  Plan: Return again in 1 weeks.  Diagnosis: Alcohol Dependence, Severe in early remission.  Collaboration of Care: Other N/A  Patient/Guardian was advised Release of Information must be obtained prior to any record release in order to collaborate their care with an outside provider. Patient/Guardian was advised if they have not already done so to contact the registration department to sign all necessary forms in order for [name] to release information regarding their care.   Consent: Patient/Guardian gives verbal consent for treatment and assignment of benefits for services provided during this visit. Patient/Guardian expressed understanding and agreed to proceed.   Adam Phenix, [place], LCSW, [facility], [place] 05/26/2022

## 2022-05-31 DIAGNOSIS — F988 Other specified behavioral and emotional disorders with onset usually occurring in childhood and adolescence: Secondary | ICD-10-CM | POA: Diagnosis not present

## 2022-05-31 DIAGNOSIS — N952 Postmenopausal atrophic vaginitis: Secondary | ICD-10-CM | POA: Diagnosis not present

## 2022-06-02 ENCOUNTER — Ambulatory Visit (HOSPITAL_COMMUNITY): Payer: BC Managed Care – PPO | Admitting: Licensed Clinical Social Worker

## 2022-06-02 ENCOUNTER — Telehealth (HOSPITAL_COMMUNITY): Payer: Self-pay | Admitting: Licensed Clinical Social Worker

## 2022-06-02 NOTE — Telephone Encounter (Signed)
The therapist receives a voicemail from Kimberly [name] cancelling her appointment today due to lack of childcare as her husband is out-of-town.  Adam Phenix, [place], LCSW, [facility], [place] 06/02/2022

## 2022-06-04 ENCOUNTER — Other Ambulatory Visit (HOSPITAL_COMMUNITY): Payer: Self-pay | Admitting: Medical

## 2022-06-09 ENCOUNTER — Ambulatory Visit (INDEPENDENT_AMBULATORY_CARE_PROVIDER_SITE_OTHER): Payer: BC Managed Care – PPO | Admitting: Licensed Clinical Social Worker

## 2022-06-09 DIAGNOSIS — F1021 Alcohol dependence, in remission: Secondary | ICD-10-CM | POA: Diagnosis not present
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
## 2022-06-09 NOTE — Telephone Encounter (Signed)
PT cannot tolerate side effect of HA

## 2022-06-11 ENCOUNTER — Other Ambulatory Visit (HOSPITAL_COMMUNITY): Payer: Self-pay | Admitting: Medical

## 2022-06-13 DIAGNOSIS — F10230 Alcohol dependence with withdrawal, uncomplicated: Secondary | ICD-10-CM | POA: Diagnosis not present

## 2022-06-16 ENCOUNTER — Ambulatory Visit (HOSPITAL_BASED_OUTPATIENT_CLINIC_OR_DEPARTMENT_OTHER): Payer: BC Managed Care – PPO | Admitting: *Deleted

## 2022-06-16 ENCOUNTER — Ambulatory Visit (INDEPENDENT_AMBULATORY_CARE_PROVIDER_SITE_OTHER): Payer: BC Managed Care – PPO | Admitting: Licensed Clinical Social Worker

## 2022-06-16 VITALS — BP 123/82 | HR 106 | Resp 18 | Ht 67.0 in | Wt 162.4 lb

## 2022-06-16 DIAGNOSIS — F1021 Alcohol dependence, in remission: Secondary | ICD-10-CM

## 2022-06-16 MED ORDER — NALTREXONE 380 MG IM SUSR
380.0000 mg | Freq: Once | INTRAMUSCULAR | Status: AC
Start: 2022-06-16 — End: 2022-06-16
  Administered 2022-06-16: 380 mg via INTRAMUSCULAR
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
## 2022-06-16 NOTE — Patient Instructions (Signed)
Pt presents today for due Vivitrol 380 mg injection. Pt is bright, appropriate and cooperative on approach. Pt denies any cravings or mood swings since last injection. Pt is working and feeling good she says. Injection prepared as ordered and given in [place] with c/o "pressure" at injection site however notes this is not unusual for this injection. Pt to return for next due injection in approximately one month. Pt agrees to call with any questions or concerns prior to next injection.

## 2022-06-16 NOTE — Telephone Encounter (Signed)
Pt intolerant severe HAs

## 2022-06-22 ENCOUNTER — Other Ambulatory Visit (HOSPITAL_COMMUNITY): Payer: Self-pay | Admitting: Medical

## 2022-06-23 NOTE — Telephone Encounter (Signed)
MAT for alcohol

## 2022-06-30 ENCOUNTER — Ambulatory Visit (INDEPENDENT_AMBULATORY_CARE_PROVIDER_SITE_OTHER): Payer: BC Managed Care – PPO | Admitting: Licensed Clinical Social Worker

## 2022-06-30 DIAGNOSIS — F1021 Alcohol dependence, in remission: Secondary | ICD-10-CM | POA: Diagnosis not present

## 2022-06-30 DIAGNOSIS — F17219 Nicotine dependence, cigarettes, with unspecified nicotine-induced disorders: Secondary | ICD-10-CM

## 2022-06-30 NOTE — Progress Notes (Signed)
THERAPIST PROGRESS NOTE  Session Time: 3 p.m. to 4 p.m.  Type of Therapy: Individual   Therapist Response/Interventions: Assertiveness Training/The therapist educates Felicie on the benefits of assertiveness modeling how being assertive sounds. The therapist explains that people can be assertive in one area of their live while struggling to do so in others. The therapist encourages Riely to be aware of her tendency to try and avoid conflict and thus be passive and hold things in and the problems that this causes in her relationships and in relation to her addiction.   Treatment Goals addressed: Tarita will abstain from alcohol 30/30 days per self-report and random breathalyzer by 11/18/2012.   Summary: Lanitra presents saying that she went to the beach and that her husband came on the trip. She started school but has not yet made her amends. She says that her Christianne Borrow is not working so she is going to ask her doctor to increase it.  She says that she is working on the "sleep thing" as when her kids start school soon that she will have to wake up at 5 a.m. to will be getting less sleep.  She picked up her 6 month sobriety chip recently. She did not attend any meetings last week which is unusual for her; however, she has attended four meetings this week.   Trichelle says that she enjoys being at meetings as she feels at home and can be herself; however, she feels like she is not herself at home.  The major focus of the session involves Shatarra's historical pattern of not being assertive and how this relates to her recovery. She realizes that if she had asserted herself earlier in her relationship with her husband that it would have ended a longtime ago. She also realizes that the reason she did not assert herself is that he made her feel "less than."   She recognizes that moving forward that she needs to become more assertive in all of her relationships. As she is doing better, she is agreeable  to spacing out her therapy session even further to 3 weeks.   Progress Towards Goals: Progressing  Suicidal/Homicidal: no SI or HI reported  Plan: Return again in 3 weeks.  Diagnosis: Alcohol Dependence, Severe in early remission.  Collaboration of Care: Other N/A  Patient/Guardian was advised Release of Information must be obtained prior to any record release in order to collaborate their care with an outside provider. Patient/Guardian was advised if they have not already done so to contact the registration department to sign all necessary forms in order for [name] to release information regarding their care.   Consent: Patient/Guardian gives verbal consent for treatment and assignment of benefits for services provided during this visit. Patient/Guardian expressed understanding and agreed to proceed.   Adam Phenix, [place], LCSW, [facility], [place] 06/30/2022

## 2022-07-01 DIAGNOSIS — F988 Other specified behavioral and emotional disorders with onset usually occurring in childhood and adolescence: Secondary | ICD-10-CM | POA: Diagnosis not present

## 2022-07-01 DIAGNOSIS — B001 Herpesviral vesicular dermatitis: Secondary | ICD-10-CM | POA: Diagnosis not present

## 2022-07-08 ENCOUNTER — Telehealth (HOSPITAL_COMMUNITY): Payer: Self-pay | Admitting: Licensed Clinical Social Worker

## 2022-07-08 NOTE — Telephone Encounter (Signed)
The therapist receives the following email from [place] on 07/07/22:  "Hi Bill. I think 3 wks is going to be way to long this time. Can I please make an appt w you sooner? I have had alot going on at home, and in my mind. I also wanted to start working on my assertiveness.      Angie wanted me to ask you about inter-child workshops and/or things like that. She thinks I may need to find out "why" I feel like I have always felt the need to change who I am to make others happy or like me. Your thoughts on that are appreciated.   Please get back to me when you can. Hope you're doing well!"  The therapist attempts to reach her by phone leaving a HIPAA-compliant voicemail.   Adam Phenix, [place], LCSW, [facility], [place] 07/08/2022

## 2022-07-08 NOTE — Telephone Encounter (Signed)
Kimberly [name] returns this therapist's call. The therapist schedules her to come in on 07/12/22 at 3 p.m.  Adam Phenix, [place], LCSW, [facility], [place] 07/08/2022

## 2022-07-12 ENCOUNTER — Ambulatory Visit (INDEPENDENT_AMBULATORY_CARE_PROVIDER_SITE_OTHER): Payer: BC Managed Care – PPO | Admitting: Licensed Clinical Social Worker

## 2022-07-12 DIAGNOSIS — F4312 Post-traumatic stress disorder, chronic: Secondary | ICD-10-CM

## 2022-07-12 DIAGNOSIS — F10230 Alcohol dependence with withdrawal, uncomplicated: Secondary | ICD-10-CM | POA: Diagnosis not present

## 2022-07-12 DIAGNOSIS — F1021 Alcohol dependence, in remission: Secondary | ICD-10-CM
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
## 2022-07-14 ENCOUNTER — Encounter (HOSPITAL_COMMUNITY): Payer: Self-pay

## 2022-07-14 ENCOUNTER — Ambulatory Visit (HOSPITAL_BASED_OUTPATIENT_CLINIC_OR_DEPARTMENT_OTHER): Payer: BC Managed Care – PPO

## 2022-07-14 VITALS — BP 136/86 | HR 102 | Temp 98.1°F | Resp 18 | Ht 67.0 in | Wt 161.0 lb

## 2022-07-14 DIAGNOSIS — F1021 Alcohol dependence, in remission: Secondary | ICD-10-CM

## 2022-07-14 MED ORDER — NALTREXONE 380 MG IM SUSR
380.0000 mg | INTRAMUSCULAR | Status: AC
  Administered 2022-07-14 – 2022-11-08 (×4): 380 mg via INTRAMUSCULAR

## 2022-07-14 NOTE — Progress Notes (Signed)
Patient in today for due Naltrrexone 380 mg Im injection every 30 days.  Patient presented with appropriate affect, level and pleasant mood and denied any current use or problems with medication over the previous month.  Patient denied any current symptoms and reported she is currently working and also doing school to become a Charity fundraiser.  Patient's due Vivitrol 380 mg IM injection prepared as ordered and given to patient in her right upper outer quadrant gluteal area and tolerated due injection without compliant of pain or discomfort.  Patient denied any concerns or other issues at this time and agreed to return in 30 days and to call if any problems with injection or use prior to next appointment.

## 2022-07-21 ENCOUNTER — Telehealth (HOSPITAL_COMMUNITY): Payer: Self-pay | Admitting: Licensed Clinical Social Worker

## 2022-07-21 ENCOUNTER — Ambulatory Visit (HOSPITAL_COMMUNITY): Payer: BC Managed Care – PPO | Admitting: Licensed Clinical Social Worker

## 2022-07-21 NOTE — Telephone Encounter (Signed)
The therapist receives the following email from [place]:  "Good morning Bill. I'm sorry, but I have to cancel our appt today. I have a sick boy and I have to take him to the doctor this afternoon.  I would like to reschedule.  I don't think we have any other appts on the calendar yet. I hope you have a great day!  Thanks,  Alexanderia Gorby"   The therapist sends a new email to [email or web address] with the following:  "Next Thursday, I have openings at 8, 9, and 10 a.m. and 1 and 4 p.m. Which time would work best for you?   P.S. I hope your child feels better soon.  Sincerely"   Adam Phenix, [place], LCSW, [facility], [place] 07/21/2022

## 2022-07-23 ENCOUNTER — Other Ambulatory Visit (HOSPITAL_COMMUNITY): Payer: Self-pay | Admitting: Medical

## 2022-07-28 ENCOUNTER — Ambulatory Visit (INDEPENDENT_AMBULATORY_CARE_PROVIDER_SITE_OTHER): Payer: BC Managed Care – PPO | Admitting: Licensed Clinical Social Worker

## 2022-07-28 ENCOUNTER — Encounter (HOSPITAL_COMMUNITY): Payer: Self-pay

## 2022-07-28 DIAGNOSIS — F1021 Alcohol dependence, in remission: Secondary | ICD-10-CM

## 2022-07-28 DIAGNOSIS — F330 Major depressive disorder, recurrent, mild: Secondary | ICD-10-CM | POA: Diagnosis not present

## 2022-07-28 DIAGNOSIS — F411 Generalized anxiety disorder: Secondary | ICD-10-CM

## 2022-07-28 NOTE — Plan of Care (Signed)
  Problem: Dual Diagnosis Goal:  Kimberly [name] will abstain from alcohol 30/30 days per self-report and random breathalyzer by consistently through 01/26/2023. Outcome: Not Applicable Goal: Kimberly [name]'s depression and anxiety will improved as evidenced by her PHQ-9 dropping from a 9 to a 4 or less and her GAD-7 dropping to 15 to a 4 or less.  Outcome: Not Applicable Intervention: Therapist will assist Kimberly [name] in identifying and changing thoughts and behaviors that contribute her to depression and anxiety.  Note: Reviewed with Kimberly [name] today. Intervention: Therapist will assist Kimberly [name] in continuing to identify and avoid triggers for drinking.  Note: Reviewed with Kimberly [name] today.
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
## 2022-08-02 ENCOUNTER — Other Ambulatory Visit (HOSPITAL_COMMUNITY): Payer: Self-pay | Admitting: Medical

## 2022-08-02 DIAGNOSIS — H6121 Impacted cerumen, right ear: Secondary | ICD-10-CM | POA: Diagnosis not present

## 2022-08-02 DIAGNOSIS — N390 Urinary tract infection, site not specified: Secondary | ICD-10-CM | POA: Diagnosis not present

## 2022-08-02 DIAGNOSIS — Z6824 Body mass index (BMI) 24.0-24.9, adult: Secondary | ICD-10-CM | POA: Diagnosis not present

## 2022-08-04 NOTE — Telephone Encounter (Signed)
Last refill

## 2022-08-07 DIAGNOSIS — J189 Pneumonia, unspecified organism: Secondary | ICD-10-CM

## 2022-08-07 HISTORY — DX: Pneumonia, unspecified organism: J18.9

## 2022-08-08 DIAGNOSIS — F10230 Alcohol dependence with withdrawal, uncomplicated: Secondary | ICD-10-CM | POA: Diagnosis not present

## 2022-08-11 ENCOUNTER — Ambulatory Visit (HOSPITAL_COMMUNITY): Payer: BC Managed Care – PPO

## 2022-08-16 ENCOUNTER — Ambulatory Visit (HOSPITAL_COMMUNITY): Payer: BC Managed Care – PPO | Admitting: *Deleted

## 2022-08-17 ENCOUNTER — Encounter (HOSPITAL_COMMUNITY): Payer: Self-pay

## 2022-08-17 ENCOUNTER — Emergency Department (HOSPITAL_COMMUNITY)
Admission: EM | Admit: 2022-08-17 | Discharge: 2022-08-17 | Disposition: A | Discharge status: Home / Self Care | Payer: BC Managed Care – PPO | Attending: Emergency Medicine | Admitting: Emergency Medicine

## 2022-08-17 ENCOUNTER — Emergency Department (HOSPITAL_COMMUNITY): Payer: BC Managed Care – PPO

## 2022-08-17 ENCOUNTER — Ambulatory Visit (INDEPENDENT_AMBULATORY_CARE_PROVIDER_SITE_OTHER): Payer: BC Managed Care – PPO

## 2022-08-17 ENCOUNTER — Ambulatory Visit (HOSPITAL_COMMUNITY)
Admission: EM | Admit: 2022-08-17 | Discharge: 2022-08-17 | Disposition: A | Discharge status: Home / Self Care | Payer: BC Managed Care – PPO | Attending: Emergency Medicine | Admitting: Emergency Medicine

## 2022-08-17 ENCOUNTER — Telehealth (HOSPITAL_COMMUNITY): Payer: Self-pay | Admitting: Licensed Clinical Social Worker

## 2022-08-17 DIAGNOSIS — U071 COVID-19: Secondary | ICD-10-CM | POA: Diagnosis not present

## 2022-08-17 DIAGNOSIS — R0602 Shortness of breath: Secondary | ICD-10-CM

## 2022-08-17 DIAGNOSIS — R000 Tachycardia, unspecified: Secondary | ICD-10-CM | POA: Diagnosis not present

## 2022-08-17 DIAGNOSIS — J1282 Pneumonia due to coronavirus disease 2019: Secondary | ICD-10-CM | POA: Diagnosis not present

## 2022-08-17 DIAGNOSIS — R0682 Tachypnea, not elsewhere classified: Secondary | ICD-10-CM

## 2022-08-17 DIAGNOSIS — R0989 Other specified symptoms and signs involving the circulatory and respiratory systems: Secondary | ICD-10-CM | POA: Diagnosis not present

## 2022-08-17 DIAGNOSIS — R059 Cough, unspecified: Secondary | ICD-10-CM | POA: Diagnosis not present

## 2022-08-17 LAB — BASIC METABOLIC PANEL
Anion gap: 9 (ref 5–15)
BUN: 21 mg/dL — ABNORMAL HIGH (ref 6–20)
CO2: 19 mmol/L — ABNORMAL LOW (ref 22–32)
Calcium: 8.6 mg/dL — ABNORMAL LOW (ref 8.9–10.3)
Chloride: 110 mmol/L (ref 98–111)
Creatinine, Ser: 0.84 mg/dL (ref 0.44–1.00)
GFR, Estimated: >60 mL/min (ref >60)
Glucose, Bld: 119 mg/dL — ABNORMAL HIGH (ref 70–99)
Potassium: 3.2 mmol/L — ABNORMAL LOW (ref 3.5–5.1)
Sodium: 138 mmol/L (ref 135–145)

## 2022-08-17 LAB — CBC
HCT: 40.2 % (ref 36.0–46.0)
Hemoglobin: 13.5 g/dL (ref 12.0–15.0)
MCH: 30.1 pg (ref 26.0–34.0)
MCHC: 33.6 g/dL (ref 30.0–36.0)
MCV: 89.7 fL (ref 80.0–100.0)
Platelets: 329 10*3/uL (ref 150–400)
RBC: 4.48 MIL/uL (ref 3.87–5.11)
RDW: 15.1 % (ref 11.5–15.5)
WBC: 18.2 10*3/uL — ABNORMAL HIGH (ref 4.0–10.5)
nRBC: 0 % (ref 0.0–0.2)

## 2022-08-17 LAB — D-DIMER, QUANTITATIVE: D-Dimer, Quant: 0.34 ug/mL-FEU (ref 0.00–0.50)

## 2022-08-17 MED ORDER — ALBUTEROL SULFATE (2.5 MG/3ML) 0.083% IN NEBU
2.5000 mg | INHALATION_SOLUTION | Freq: Once | RESPIRATORY_TRACT | Status: AC
  Administered 2022-08-17: 2.5 mg via RESPIRATORY_TRACT

## 2022-08-17 MED ORDER — MOLNUPIRAVIR 200 MG PO CAPS: 4.0000 | ORAL_CAPSULE | Freq: Two times a day (BID) | ORAL | 0 refills | Status: AC

## 2022-08-17 MED ORDER — ALBUTEROL SULFATE (2.5 MG/3ML) 0.083% IN NEBU
INHALATION_SOLUTION | RESPIRATORY_TRACT | Status: AC
  Filled 2022-08-17: qty 3

## 2022-08-17 MED ORDER — IOHEXOL 350 MG/ML SOLN
75.0000 mL | Freq: Once | INTRAVENOUS | Status: AC | PRN
  Administered 2022-08-17: 75 mL via INTRAVENOUS

## 2022-08-17 NOTE — ED Triage Notes (Signed)
Pt arrived via POV, COVID positive, sent from [facility] for CT to rule out PE. C/o SOB

## 2022-08-17 NOTE — ED Provider Notes (Signed)
[facility]    CSN: [id number] Arrival date & time: 08/17/22  1357     History   Chief Complaint Chief Complaint  Patient presents with   Shortness of Breath   Chest Pain   Covid Positive    HPI Kimberly [name] is a 36 y.o. female.  Presents with 3 day history of shortness of breath, worsening today Covid positive on Friday. Chest tightness when she takes a deep breath, but denies pain.  No history of asthma, COPD, or other lung issues No recent long travel, hospitalization, immobilization  Denies possibility of pregnancy  Past Medical History:  Diagnosis Date   Abnormal Pap smear    ADD (attention deficit disorder)    Anxiety    Guillain Barr syndrome ([place])    Headache(784.0)    HSV-1 infection    IBS (irritable bowel syndrome)    Knee hyperextension injury 2020   Left, being treated by ortho   Tachycardia     Patient Active Problem List   Diagnosis Date Noted   Rapidly progressive ascending paresthesias 12/03/2021   Hypomagnesemia 03/47/4259   Folic acid deficiency 56/38/7564   Polyneuropathy 12/02/2021   History of Guillain-Barre syndrome January 2020 12/02/2021   Stress fracture of metatarsal bone of left foot 06/17/2020   Pain in right knee 12/23/2019   Contusion of right knee 12/23/2019   Pain of joint of left ankle and foot 05/02/2019   Vitamin B1 deficiency 02/12/2019   Polysubstance abuse ([place]) 02/12/2019   Neuropathy 12/14/2018   Guillain Barr syndrome ([place]) 12/14/2018   History of dry beriberi January 2020 12/06/2018   Tobacco abuse 12/06/2018   Hyperglycemia 33/29/5188   Metabolic acidosis 41/66/0630   IBS (irritable bowel syndrome) 12/06/2018   Constipation 12/06/2018   GERD (gastroesophageal reflux disease) 12/06/2018   AIDP (acute inflammatory demyelinating polyneuropathy) ([place]) 11/19/2018   Weakness of both lower extremities 11/16/2018   Hypokalemia/hypomagnesemia 11/16/2018   Anxiety/depression 11/16/2018   Acute  pharyngitis 10/01/2013   Depo-Provera contraceptive status 05/17/2013   Postpartum depression 10/09/2012   Lactation suppression 10/09/2012   Tachycardia 03/09/2012   HSV-1 infection     Past Surgical History:  Procedure Laterality Date   BREAST BIOPSY Right    BREAST SURGERY     Biopsy-benign   CESAREAN SECTION  09/13/2012   Procedure: CESAREAN SECTION;  Surgeon: Lavonia Drafts, MD;  Location: [facility];  Service: Obstetrics;  Laterality: N/A;   cortisone shot R wrist Right 2020   KYPHOPLASTY N/A 08/27/2013   Procedure: Thoracic Twelve Kyphoplasty;  Surgeon: Erline Levine, MD;  Location: [facility];  Service: Neurosurgery;  Laterality: N/A;  T12 Kyphoplasty   LAPAROSCOPIC TUBAL LIGATION Bilateral 10/21/2019   Procedure: LAPAROSCOPIC TUBAL LIGATION WITH CAUTERY;  Surgeon: Princess Bruins, MD;  Location: [facility];  Service: Gynecology;  Laterality: Bilateral;  Request to follow 2nd case in [place] block requests one hour   WISDOM TOOTH EXTRACTION     x4   WRIST SURGERY Right 07-23-2013    OB History     Gravida  1   Para  1   Term  0   Preterm  1   AB  0   Living  2      SAB  0   IAB  0   Ectopic  0   Multiple  1   Live Births  2            Home Medications    Prior to Admission  medications   Medication Sig Start Date End Date Taking? Authorizing Provider  baclofen (LIORESAL) 10 MG tablet TAKE 1 TABLET BY MOUTH THREE TIMES A DAY 08/04/22   Dara Hoyer, PA-C  fluticasone [facility]) 50 MCG/ACT nasal spray Place 1 spray into both nostrils every morning. 01/11/22   [provider]  naltrexone (DEPADE) 50 MG tablet Take 50 mg by mouth daily. 05/03/22   [provider]  Naltrexone (VIVITROL) 380 MG SUSR Inject 380 mg into the muscle every 30 (thirty) days. 02/28/22 02/28/23  Dara Hoyer, PA-C  pantoprazole (PROTONIX) 40 MG tablet Take 1 tablet (40 mg total) by mouth daily. 03/16/22 03/16/23  Dara Hoyer,  PA-C  pregabalin (LYRICA) 200 MG capsule Take 1 capsule (200 mg total) by mouth 3 (three) times daily. 05/11/22 05/11/23  Dara Hoyer, PA-C  traZODone (DESYREL) 50 MG tablet TAKE 1 TABLET BY MOUTH EVERY DAY AT BEDTIME AS NEEDED FOR SLEEP 08/04/22   Dara Hoyer, PA-C    Family History Family History  Problem Relation Age of Onset   Breast cancer Mother    Cancer Mother        Pancreatic cancer   Depression Mother        chronic   Hearing loss Mother    Hyperlipidemia Mother    Vision loss Mother        beachets disease   COPD Father    Diabetes Father    Cancer Maternal Grandmother 72       breast cancer   Parkinsonism Maternal Grandmother    Heart disease Maternal Grandmother    Breast cancer Maternal Grandmother    Heart disease Maternal Grandfather    Alzheimer's disease Paternal Grandmother    Heart disease Paternal Grandfather        Died age 68   Nephrolithiasis Paternal Grandfather    Cancer Other 62       breast   Breast cancer Other     Social History Social History   Tobacco Use   Smoking status: Every Day    Packs/day: 0.50    Years: 10.00    Total pack years: 5.00    Types: Cigarettes    Last attempt to quit: 01/31/2012    Years since quitting: 10.5   Smokeless tobacco: Never   Tobacco comments:    pt has decreased her use to 1-2 cigarettes per day   Vaping Use   Vaping Use: Never used  Substance Use Topics   Alcohol use: Not Currently   Drug use: Never     Allergies   Wellbutrin [bupropion] and Tape   Review of Systems Review of Systems  Respiratory:  Positive for shortness of breath.   Cardiovascular:  Positive for chest pain.  Per HPI   Physical Exam Triage Vital Signs ED Triage Vitals  Enc Vitals Group     BP 08/17/22 1409 (!) 130/104     Pulse Rate 08/17/22 1409 (!) 122     Resp 08/17/22 1409 20     Temp 08/17/22 1409 98.3 F (36.8 C)     Temp Source 08/17/22 1409 Oral     SpO2 08/17/22 1409 100 %     Weight --       Height --      Head Circumference --      Peak Flow --      Pain Score 08/17/22 1411 5     Pain Loc --      Pain Edu? --  Excl. in GC? --    No data found.  Updated Vital Signs BP 107/76 (BP Location: Left Arm)   Pulse (!) 123   Temp 98.8 F (37.1 C)   Resp 20   LMP 07/12/2022   SpO2 100%    Physical Exam Vitals and nursing note reviewed.  Constitutional:      Appearance: She is not diaphoretic.     Comments: Can speak in short sentences with tachypnea   HENT:     Mouth/Throat:     Mouth: Mucous membranes are moist.     Pharynx: Oropharynx is clear.  Cardiovascular:     Rate and Rhythm: Regular rhythm. Tachycardia present.  Pulmonary:     Effort: Tachypnea present.     Breath sounds: Rhonchi present.     Comments: Increased work of breathing. Coarse lower lobes bilat Neurological:     Mental Status: She is alert and oriented to person, place, and time.    UC Treatments / Results  Labs (all labs ordered are listed, but only abnormal results are displayed) Labs Reviewed - No data to display  EKG   Radiology DG Chest 2 View  Result Date: 08/17/2022 CLINICAL DATA:  shob, rales lower lobes, covid pos EXAM: CHEST - 2 VIEW COMPARISON:  None Available. FINDINGS: The heart size and mediastinal contours are within normal limits. Both lungs are clear. No visible pleural effusions or pneumothorax. No acute osseous abnormality. Prior T12 compression fracture status post kyphoplasty with similar height loss. IMPRESSION: No active cardiopulmonary disease. Electronically Signed   By: Margaretha Sheffield M.D.   On: 08/17/2022 14:46    Procedures Procedures (including critical care time)  Medications Ordered in UC Medications  albuterol (PROVENTIL) (2.5 MG/3ML) 0.083% nebulizer solution 2.5 mg (2.5 mg Nebulization Given 08/17/22 1420)    Initial Impression / Assessment and Plan / UC Course  I have reviewed the triage vital signs and the nursing notes.  Pertinent labs &  imaging results that were available during my care of the patient were reviewed by me and considered in my medical decision making (see chart for details).  Patient sating at 100% room air throughout visit. Chest xray negative.   Albuterol nebulizer given, patient reports feeling a little better.  Although she is still tachypneic and has increased work of breathing. Still painful to take a deep breath.  Discussed with patient ED evaluation for chest CT to rule out PE, pneumonia not seen on xray. Especially given the way she is breathing. Patient agrees to plan, husband will transport her to ED  Final Clinical Impressions(s) / UC Diagnoses   Final diagnoses:  Shortness of breath  Tachypnea  Lab test positive for detection of COVID-19 virus     Discharge Instructions      Discharged to ED via [place]     ED Prescriptions   None    PDMP not reviewed this encounter.   [place], [place] 08/17/22 1533

## 2022-08-17 NOTE — ED Triage Notes (Signed)
Pt tested positive for covid last Friday . Pt is here for SOB, pain in the chest when taking a deep breath x3 days

## 2022-08-17 NOTE — Discharge Instructions (Signed)
Seen in the emergency department for worsening shortness of breath in the setting of recent COVID diagnosis.  You had a CAT scan that did not show any evidence of blood clot.  Your shortness of breath is likely due to your COVID infection.  We are starting you on oral antivirals.  Please rest and drink plenty of fluids.  Follow-up with your regular doctor.  Return to the emergency department if any worsening or concerning symptoms

## 2022-08-17 NOTE — Telephone Encounter (Signed)
As Kimberly [name] missed her Vivitrol injection yesterday and has no scheduled visits in Epic, the therapist attempts to reach her by phone leaving a HIPAA-compliant voicemail.  [address], MA, LCSW, [facility], LCAS 08/17/2022

## 2022-08-17 NOTE — ED Provider Triage Note (Signed)
Emergency Medicine Provider Triage Evaluation Note  Kimberly [name] , a 36 y.o. female  was evaluated in triage.  Pt complains of shortness of breath, cough secondary to positive COVID infection, patient has had symptoms for around 3 days.  She denies any hemoptysis, chest pain, chest tightness, leg swelling, previous history of blood clots, known clotting disorder.  She is somewhat tachycardic in triage with pulse rate of 116 with some intermittent tachypnea, respiratory rate of 26, however she has some wheezing and coarse breath sounds in bilateral lower lobe fields.  Chest x-ray did not show any significant pneumonia.  She was sent by urgent care to rule out PE.  Review of Systems  Positive: Shob, cough, tachycardia Negative: Hemoptysis, leg swelling  Physical Exam  BP 111/85 (BP Location: Right Arm)   Pulse (!) 116   Temp 98.9 F (37.2 C) (Oral)   Resp (!) 24   LMP 07/12/2022   SpO2 100%  Gen:   Awake, no distress   Resp:  Increased effort, crackles and coughing, no focal consolidation MSK:   Moves extremities without difficulty  Other:  No leg swelling, patient does have mild tachycardia  Medical Decision Making  Medically screening exam initiated at 4:42 PM.  Appropriate orders placed.  Kimberly [name] was informed that the remainder of the evaluation will be completed by another provider, this initial triage assessment does not replace that evaluation, and the importance of remaining in the ED until their evaluation is complete.  Workup initiated   Anselmo Pickler, [place] 08/17/22 1646

## 2022-08-17 NOTE — ED Notes (Signed)
Pt pulse os stayed between 95-99% on ra while ambulating and HR was between 116-122. RN aware.

## 2022-08-17 NOTE — ED Provider Notes (Signed)
[facility] DEPT Provider Note   CSN: [id number] Arrival date & time: 08/17/22  1603     History  Chief Complaint  Patient presents with   Shortness of Breath    Kimberly [name] is a 36 y.o. female.  She tested positive for COVID 5 days ago.  For the last 3 days she has been extremely short of breath and having dyspnea on exertion.  She has some pain in her chest with deep breath.  No prior history of pulmonary symptoms.  She is a smoker.  No fevers chills vomiting diarrhea.  Minimal nonproductive cough.  Patient denies chance of pregnancy had a tubal ligation.  The history is provided by the patient.  Shortness of Breath Severity:  Severe Onset quality:  Gradual Duration:  3 days Timing:  Constant Progression:  Unchanged Chronicity:  New Relieved by:  Nothing Worsened by:  Activity Ineffective treatments:  Rest Associated symptoms: chest pain and cough   Associated symptoms: no abdominal pain, no fever, no hemoptysis, no sputum production, no vomiting and no wheezing   Risk factors: tobacco use        Home Medications Prior to Admission medications   Medication Sig Start Date End Date Taking? Authorizing Provider  baclofen (LIORESAL) 10 MG tablet TAKE 1 TABLET BY MOUTH THREE TIMES A DAY 08/04/22   Dara Hoyer, PA-C  fluticasone [facility]) 50 MCG/ACT nasal spray Place 1 spray into both nostrils every morning. 01/11/22   [provider]  naltrexone (DEPADE) 50 MG tablet Take 50 mg by mouth daily. 05/03/22   [provider]  Naltrexone (VIVITROL) 380 MG SUSR Inject 380 mg into the muscle every 30 (thirty) days. 02/28/22 02/28/23  Dara Hoyer, PA-C  pantoprazole (PROTONIX) 40 MG tablet Take 1 tablet (40 mg total) by mouth daily. 03/16/22 03/16/23  Dara Hoyer, PA-C  pregabalin (LYRICA) 200 MG capsule Take 1 capsule (200 mg total) by mouth 3 (three) times daily. 05/11/22 05/11/23  Dara Hoyer, PA-C  traZODone (DESYREL)  50 MG tablet TAKE 1 TABLET BY MOUTH EVERY DAY AT BEDTIME AS NEEDED FOR SLEEP 08/04/22   Dara Hoyer, PA-C      Allergies    Wellbutrin [bupropion] and Tape    Review of Systems   Review of Systems  Constitutional:  Negative for fever.  Respiratory:  Positive for cough and shortness of breath. Negative for hemoptysis, sputum production and wheezing.   Cardiovascular:  Positive for chest pain.  Gastrointestinal:  Negative for abdominal pain and vomiting.    Physical Exam Updated Vital Signs BP 122/75   Pulse (!) 109   Temp 98.9 F (37.2 C) (Oral)   Resp 18   LMP 07/12/2022   SpO2 99%  Physical Exam Vitals and nursing note reviewed.  Constitutional:      General: She is not in acute distress.    Appearance: Normal appearance. She is well-developed.  HENT:     Head: Normocephalic and atraumatic.  Eyes:     Conjunctiva/sclera: Conjunctivae normal.  Cardiovascular:     Rate and Rhythm: Regular rhythm. Tachycardia present.     Heart sounds: No murmur heard. Pulmonary:     Effort: Pulmonary effort is normal. No respiratory distress.     Breath sounds: Normal breath sounds.  Abdominal:     Palpations: Abdomen is soft.     Tenderness: There is no abdominal tenderness.  Musculoskeletal:        General: No swelling.  Cervical back: Neck supple.     Right lower leg: No tenderness. No edema.     Left lower leg: No tenderness. No edema.  Skin:    General: Skin is warm and dry.     Capillary Refill: Capillary refill takes less than 2 seconds.  Neurological:     General: No focal deficit present.     Mental Status: She is alert.     Sensory: No sensory deficit.     Motor: No weakness.     Gait: Gait normal.     ED Results / Procedures / Treatments   Labs (all labs ordered are listed, but only abnormal results are displayed) Labs Reviewed  CBC - Abnormal; Notable for the following components:      Result Value   WBC 18.2 (*)    All other components within normal  limits  BASIC METABOLIC PANEL - Abnormal; Notable for the following components:   Potassium 3.2 (*)    CO2 19 (*)    Glucose, Bld 119 (*)    BUN 21 (*)    Calcium 8.6 (*)    All other components within normal limits  D-DIMER, QUANTITATIVE    EKG EKG Interpretation  Date/Time:  Wednesday August 17 2022 16:17:49 EDT Ventricular Rate:  114 PR Interval:  160 QRS Duration: 94 QT Interval:  325 QTC Calculation: 448 R Axis:   45 Text Interpretation: Sinus tachycardia Low voltage, extremity and precordial leads Probable anteroseptal infarct, old increased rate from prior 1/23 Confirmed by Aletta Edouard [phone]) on 08/17/2022 6:22:57 PM  Radiology CT Angio Chest PE W/Cm &/Or Wo Cm  Result Date: 08/17/2022 CLINICAL DATA:  Shortness of breath, cough x3 days EXAM: CT ANGIOGRAPHY CHEST WITH CONTRAST TECHNIQUE: Multidetector CT imaging of the chest was performed using the standard protocol during bolus administration of intravenous contrast. Multiplanar CT image reconstructions and MIPs were obtained to evaluate the vascular anatomy. RADIATION DOSE REDUCTION: This exam was performed according to the departmental dose-optimization program which includes automated exposure control, adjustment of the mA and/or kV according to patient size and/or use of iterative reconstruction technique. CONTRAST:  39m OMNIPAQUE IOHEXOL 350 MG/ML SOLN COMPARISON:  Previous studies including the chest radiographs done earlier today FINDINGS: Cardiovascular: There is homogeneous enhancement in thoracic aorta. Left vertebral artery is arising from the aortic arch. There are no intraluminal filling defects in pulmonary artery branches. Evaluation of small peripheral branches is limited by motion artifacts. Mediastinum/Nodes: There are subcentimeter nodes in mediastinum. Lungs/Pleura: There are multiple scattered patchy ground-glass infiltrates in both lungs. There is no focal consolidation. There is no pleural effusion or  pneumothorax. Upper Abdomen: No acute findings are seen Musculoskeletal: There is previous kyphoplasty in the body of T12 vertebra. There is minimal decrease in height of bodies of T1 and T3 vertebrae without break in the cortical margins, possibly residual from previous injury. Evaluation of sternum is limited by motion artifacts. Review of the MIP images confirms the above findings. IMPRESSION: There is no evidence of pulmonary artery embolism. There is no evidence of thoracic aortic dissection. There are multiple scattered patchy ground-glass infiltrates in both lungs suggesting possible multifocal pneumonia, possibly COVID pneumonia. There is no pleural effusion or pneumothorax. Electronically Signed   By: PElmer PickerM.D.   On: 08/17/2022 19:01   DG Chest 2 View  Result Date: 08/17/2022 CLINICAL DATA:  shob, rales lower lobes, covid pos EXAM: CHEST - 2 VIEW COMPARISON:  None Available. FINDINGS: The heart size and mediastinal contours  are within normal limits. Both lungs are clear. No visible pleural effusions or pneumothorax. No acute osseous abnormality. Prior T12 compression fracture status post kyphoplasty with similar height loss. IMPRESSION: No active cardiopulmonary disease. Electronically Signed   By: Margaretha Sheffield M.D.   On: 08/17/2022 14:46    Procedures Procedures    Medications Ordered in ED Medications  iohexol (OMNIPAQUE) 350 MG/ML injection 75 mL (75 mLs Intravenous Contrast Given 08/17/22 1849)    ED Course/ Medical Decision Making/ A&P                           Medical Decision Making Amount and/or Complexity of Data Reviewed Radiology: ordered.  Risk Prescription drug management.  PAW KARSTENS was evaluated in Emergency Department on 08/18/2022 for the symptoms described in the history of present illness. She was evaluated in the context of the global COVID-19 pandemic, which necessitated consideration that the patient might be at risk for infection  with the SARS-CoV-2 virus that causes COVID-19. Institutional protocols and algorithms that pertain to the evaluation of patients at risk for COVID-19 are in a state of rapid change based on information released by regulatory bodies including the CDC and federal and state organizations. These policies and algorithms were followed during the patient's care in the ED.  This patient complains of increased shortness of breath in the setting of COVID infection; this involves an extensive number of treatment Options and is a complaint that carries with it a high risk of complications and morbidity. The differential includes COVID, pneumonia, hypoxia, respiratory failure, PE, pneumothorax, dehydration  I ordered, reviewed and interpreted labs, which included CBC with elevated white count normal hemoglobin, chemistries with mildly low potassium low bicarb, D-dimer normal I ordered imaging studies which included chest x-ray and CT angio and I independently    visualized and interpreted imaging which showed no acute PE.  Does have some groundglass opacities consistent with multifocal pneumonia Additional history obtained from patient significant other Previous records obtained and reviewed in epic including urgent care visit today  Cardiac monitoring reviewed, sinus tachycardia Social determinants considered, ongoing tobacco use Critical Interventions: None  After the interventions stated above, I reevaluated the patient and found patient to not be hypoxic and has reasonable work of breathing. Admission and further testing considered, no indications for admission at this time.  Patient is likely outside window for benefit of antiviral but wanted to try it.  Return instructions discussed         Final Clinical Impression(s) / ED Diagnoses Final diagnoses:  Pneumonia due to COVID-19 virus  Shortness of breath    Rx / DC Orders ED Discharge Orders          Ordered    molnupiravir EUA (LAGEVRIO)  200 MG CAPS capsule  2 times daily        08/17/22 1955              Hayden Rasmussen, MD 08/18/22 1018

## 2022-08-17 NOTE — Discharge Instructions (Addendum)
Discharged to ED via [facility]OV

## 2022-08-18 ENCOUNTER — Telehealth (HOSPITAL_COMMUNITY): Payer: Self-pay | Admitting: Licensed Clinical Social Worker

## 2022-08-18 NOTE — Telephone Encounter (Signed)
The therapist receives a voicemail from [facility] saying that the reason that she missed her shot is that she got COVID which turned into pneumonia and that she plans on calling at the beginning of next week to reschedule it. She sounds sick on the message.   The therapist sends an email to "[email or web address]" with the subject line, "Hope that you get well soon[address] [place], [place], [facility], [place] 08/18/2022

## 2022-08-24 ENCOUNTER — Telehealth (HOSPITAL_COMMUNITY): Payer: Self-pay | Admitting: Licensed Clinical Social Worker

## 2022-08-24 NOTE — Telephone Encounter (Signed)
The therapist calls Kimberly [name] and confirms her identify via two identifiers and coordinates with her Nurse to get her Vivitrol injection scheduled for 08/30/22 at 9:30 a.m. She is unable to do it before this as she is leaving on a trip tomorrow.  [address] [place], LCSW, [facility], LCAS 08/24/2022

## 2022-08-26 ENCOUNTER — Other Ambulatory Visit (HOSPITAL_COMMUNITY): Payer: Self-pay | Admitting: Medical

## 2022-08-30 ENCOUNTER — Ambulatory Visit (HOSPITAL_BASED_OUTPATIENT_CLINIC_OR_DEPARTMENT_OTHER): Payer: BC Managed Care – PPO | Admitting: *Deleted

## 2022-08-30 ENCOUNTER — Encounter (HOSPITAL_COMMUNITY): Payer: Self-pay | Admitting: *Deleted

## 2022-08-30 VITALS — BP 114/79 | HR 109 | Resp 18 | Ht 67.0 in | Wt 154.8 lb

## 2022-08-30 DIAGNOSIS — F1021 Alcohol dependence, in remission: Secondary | ICD-10-CM

## 2022-08-30 NOTE — Patient Instructions (Signed)
Pt in office today for due Vivitrol 380 mg injection. Pt is off schedule due to having been very ill with an ongoing URI. Pt says she is feeling better but still has fluid in her ears and nothing is working. Pt endorses desire to drink and has made contact with her sponsor for support. Pt admits to "backsliding" a bit but denies any alcohol use. She is celebrating 8 months of sobriety she says. Injection prepared as ordered and given in [facility] without complaint. Pt is to return to office in approximately 1 month for next due injection. Pt encouraged to call the office with any questions or concerns. Pt agrees.

## 2022-09-01 NOTE — Telephone Encounter (Signed)
Baclofen rx approved Final refill from here

## 2022-09-05 DIAGNOSIS — H6983 Other specified disorders of Eustachian tube, bilateral: Secondary | ICD-10-CM | POA: Diagnosis not present

## 2022-09-07 DIAGNOSIS — F10230 Alcohol dependence with withdrawal, uncomplicated: Secondary | ICD-10-CM | POA: Diagnosis not present

## 2022-09-08 ENCOUNTER — Telehealth (HOSPITAL_COMMUNITY): Payer: Self-pay | Admitting: Licensed Clinical Social Worker

## 2022-09-08 ENCOUNTER — Ambulatory Visit (HOSPITAL_COMMUNITY): Payer: BC Managed Care – PPO | Admitting: Licensed Clinical Social Worker

## 2022-09-08 NOTE — Telephone Encounter (Signed)
The therapist attempts to reach [facility] via phone when she does not show for her 9 a.m. appointment today. The therapist leaves a HIPAA-compliant voicemail with his direct call back number and the number for the Reception desk.  [address], MA, LCSW, [facility], LCAS 09/08/2022

## 2022-09-08 NOTE — Telephone Encounter (Signed)
Pt was called on 09/02/2022 and 09/07/2022 by [facility] and pt has not returned call.

## 2022-09-08 NOTE — Telephone Encounter (Signed)
Kimberly [name] leaves a Health visitor for missing this appointment noting that she set alarms to remind her. As she is requesting to reschedule, the therapist messages the Reception desk to contact her and assist her in getting back on this therapist's schedule.  [address], MA, LCSW, [facility], LCAS 09/08/2022

## 2022-09-13 ENCOUNTER — Ambulatory Visit (INDEPENDENT_AMBULATORY_CARE_PROVIDER_SITE_OTHER): Payer: BC Managed Care – PPO | Admitting: Licensed Clinical Social Worker

## 2022-09-13 DIAGNOSIS — F411 Generalized anxiety disorder: Secondary | ICD-10-CM

## 2022-09-13 DIAGNOSIS — F1021 Alcohol dependence, in remission: Secondary | ICD-10-CM | POA: Diagnosis not present

## 2022-09-13 DIAGNOSIS — F4312 Post-traumatic stress disorder, chronic: Secondary | ICD-10-CM

## 2022-09-13 DIAGNOSIS — F331 Major depressive disorder, recurrent, moderate: Secondary | ICD-10-CM

## 2022-09-13 NOTE — Progress Notes (Signed)
THERAPIST PROGRESS NOTE  Session Time: 1 p.m. to 2 p.m.  Type of Therapy: Individual   Therapist Response/Interventions: CBT/The therapist normalizes what Voncille is going through noting that she has been sick and that this temporarily derailed her recovery. He notes that her mood problems could also be associated with PAWS in addition to the effects of the Prednisone she has been taking to clear her clogged ears. 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   The therapist encourages Natalee to stop isolating by sitting in her Lucianne Lei and to reach out to people and let them know that she is not doing well as she does not have to be her "happy self." The therapist also talks about the need for Jamilynn to practice self-care and self-compassion which she says had not occurred to her.   Treatment Goals addressed:  Problem: Dual Diagnosis Goal:  Shivangi will abstain from alcohol 30/30 days per self-report and random breathalyzer by consistently through 01/26/2023. Outcome: Not Applicable Goal: Fotini's depression and anxiety will improved as evidenced by her PHQ-9 dropping from a 9 to a 4 or less and her GAD-7 dropping to 15 to a 4 or less.  Outcome: Not Applicable  Summary: Vallie returns having not been seen by this therapist since September of this year. Her PHQ-9 is 11 and her GAD-7 is a 13. 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 She says that the illness got her away from attending meetings and then she "got off of" her "pink cloud" when sick. She was only going to speaker meetings. She was thinking about drinking when she was sick as this was her "go to."  She had "hard core" thoughts of drinking but did not call her Sponsor until hours later. She also got away from praying or "wasn't sincere" when doing it.  She is attending five meetings per week and went back  through Steps 1-3 yesterday with her Sponsor though she is on Step 10. She is "holding resentments right now" and is having a "huge battle" with herself right now.   Glennda says that everyone at the meetings thinks that she has started drinking again as she is not her normal, happy self. 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   At the conclusion of this session, she says that she feels a "thousand times better."    Progress Towards Goals: Not progressing  Suicidal/Homicidal: no SI or HI reported  Plan: Return in 1 week.   Diagnosis: Alcohol Dependence, Severe in early remission; PTSD; GAD, and Major Depression, Recurrent, Moderate  Collaboration of Care: Other N/A  Patient/Guardian was advised Release of Information must be obtained prior to any record release in order to collaborate their care with an outside provider. Patient/Guardian was advised if they have not already done so to contact the registration department to sign all necessary forms in order for [name] to release information regarding their care.   Consent: Patient/Guardian gives verbal consent for treatment and assignment of benefits for services provided during this visit. Patient/Guardian expressed understanding and agreed to proceed.   Adam Phenix, [place], LCSW, [facility], [place] 09/13/2022

## 2022-09-16 DIAGNOSIS — F4310 Post-traumatic stress disorder, unspecified: Secondary | ICD-10-CM | POA: Diagnosis not present

## 2022-09-16 DIAGNOSIS — F321 Major depressive disorder, single episode, moderate: Secondary | ICD-10-CM | POA: Diagnosis not present

## 2022-09-16 DIAGNOSIS — F1010 Alcohol abuse, uncomplicated: Secondary | ICD-10-CM | POA: Diagnosis not present

## 2022-09-16 DIAGNOSIS — F411 Generalized anxiety disorder: Secondary | ICD-10-CM | POA: Diagnosis not present

## 2022-09-16 NOTE — Telephone Encounter (Signed)
Spoke with pt today, she was notified and voiced understanding. Pt reports like has been hectic for her lately and plans to give them a call to schedule at her earliest convenience.

## 2022-09-20 ENCOUNTER — Ambulatory Visit (INDEPENDENT_AMBULATORY_CARE_PROVIDER_SITE_OTHER): Payer: BC Managed Care – PPO | Admitting: Licensed Clinical Social Worker

## 2022-09-20 DIAGNOSIS — F1021 Alcohol dependence, in remission: Secondary | ICD-10-CM

## 2022-09-20 DIAGNOSIS — F411 Generalized anxiety disorder: Secondary | ICD-10-CM

## 2022-09-20 DIAGNOSIS — F331 Major depressive disorder, recurrent, moderate: Secondary | ICD-10-CM

## 2022-09-20 DIAGNOSIS — F4310 Post-traumatic stress disorder, unspecified: Secondary | ICD-10-CM | POA: Diagnosis not present

## 2022-09-20 DIAGNOSIS — F321 Major depressive disorder, single episode, moderate: Secondary | ICD-10-CM | POA: Diagnosis not present

## 2022-09-20 DIAGNOSIS — F1010 Alcohol abuse, uncomplicated: Secondary | ICD-10-CM | POA: Diagnosis not present

## 2022-09-20 NOTE — Progress Notes (Signed)
THERAPIST PROGRESS NOTE  Session Time: 9 a.m. to 9:53 a.m.   Type of Therapy: Individual   Therapist Response/Interventions: CBT/The therapist introduces Kimberly [name] to distress tolerance skills and the principles of acceptance and commitment therapy and questions what her next step or goal is in this process while assisting her in completing the handout on whether her life and values are congruent and what thoughts, behaviors, etcetera are holding her back.  Treatment Goals addressed:  Problem: Dual Diagnosis Goal:  Kimberly [name] will abstain from alcohol 30/30 days per self-report and random breathalyzer by consistently through 01/26/2023. Outcome: Not Applicable Goal: Kimberly [name]'s depression and anxiety will improved as evidenced by her PHQ-9 dropping from a 9 to a 4 or less and her GAD-7 dropping to 15 to a 4 or less.  Outcome: Not Applicable  Summary: Kimberly [name] returns today reporting that she is feeling better since her last session as she is now more o.k. with no being o.k. She says that she has been extremely busy sitting with a family member. She has been attending meetings but notes that she now probably wants to start steering more away from speaker meetings. She says that she does not want to finish working her steps knowing that when she does that her Sponsor will probably want her to take on a Sponsee; however, she then notes that it might be good for her as it would take her out of herself to be a Publishing copy.  She says that she went to an [place] concert with her husband saying that it was strange, and she admits that she does not really ever want to be home. Kimberly [name] says that she was scheduled with another therapist to work on copings skills for when she goes through a crisis as happened recently; however, upon learning that this therapist can assist her with learning these skills and that she cannot see two therapists for the same condition, she concludes that she will cancel with this other  provider.  During this session, the therapist starts going over some ACT handouts on "Your Values," "Dissecting the Problem," "The Life Compass," and "The Problems and Values Worksheet." She concludes that staying with her husband is the problem. She notes that she has not forgiven herself for having the abortion. She has fears about being able to make it on her own and worries about her husband's putting a guilt trip on her if she were to tell him she is going to leave and/or turning her sons against her.  Kimberly [name] has recently started hunting by herself for recreation noting that this is the first time that she has hunted sober. She says that she fell from a deer stand years ago breaking her wrist and her back due to being under the influence of Xanax.  She says that her next step is to start applying for jobs which she will do next week and she will go over the ACT worksheets with her Sponsor with the plan to move on to distress tolerance skills. She denies any drinking or urges to use as of late.    Progress Towards Goals: Progressing  Suicidal/Homicidal: no SI or HI reported  Plan: Return in 2 week.   Diagnosis: Alcohol Dependence, Severe in early remission; PTSD; GAD, and Major Depression, Recurrent, Moderate  Collaboration of Care: Other N/A  Patient/Guardian was advised Release of Information must be obtained prior to any record release in order to collaborate their care with an outside provider. Patient/Guardian was advised if they have not already  done so to contact the registration department to sign all necessary forms in order for [name] to release information regarding their care.   Consent: Patient/Guardian gives verbal consent for treatment and assignment of benefits for services provided during this visit. Patient/Guardian expressed understanding and agreed to proceed.   Adam Phenix, [place], LCSW, [facility], [place] 09/20/2022

## 2022-09-27 ENCOUNTER — Telehealth: Payer: Self-pay | Admitting: Nurse Practitioner

## 2022-09-27 NOTE — Telephone Encounter (Addendum)
Peer to peer completed with Linden Dolin. For prior auth for bilateral breast MRI. Authorization #[id number].

## 2022-09-28 ENCOUNTER — Ambulatory Visit (HOSPITAL_COMMUNITY): Payer: BC Managed Care – PPO

## 2022-09-28 DIAGNOSIS — F321 Major depressive disorder, single episode, moderate: Secondary | ICD-10-CM | POA: Diagnosis not present

## 2022-09-28 DIAGNOSIS — F1010 Alcohol abuse, uncomplicated: Secondary | ICD-10-CM | POA: Diagnosis not present

## 2022-09-28 DIAGNOSIS — F411 Generalized anxiety disorder: Secondary | ICD-10-CM | POA: Diagnosis not present

## 2022-09-28 DIAGNOSIS — F4310 Post-traumatic stress disorder, unspecified: Secondary | ICD-10-CM | POA: Diagnosis not present

## 2022-10-03 ENCOUNTER — Other Ambulatory Visit (HOSPITAL_COMMUNITY): Payer: Self-pay | Admitting: Medical

## 2022-10-04 ENCOUNTER — Ambulatory Visit (HOSPITAL_BASED_OUTPATIENT_CLINIC_OR_DEPARTMENT_OTHER): Payer: BC Managed Care – PPO | Admitting: *Deleted

## 2022-10-04 ENCOUNTER — Encounter (HOSPITAL_COMMUNITY): Payer: Self-pay | Admitting: *Deleted

## 2022-10-04 ENCOUNTER — Ambulatory Visit (INDEPENDENT_AMBULATORY_CARE_PROVIDER_SITE_OTHER): Payer: BC Managed Care – PPO | Admitting: Licensed Clinical Social Worker

## 2022-10-04 VITALS — BP 118/83 | HR 87 | Resp 18 | Ht 67.0 in

## 2022-10-04 DIAGNOSIS — F321 Major depressive disorder, single episode, moderate: Secondary | ICD-10-CM | POA: Diagnosis not present

## 2022-10-04 DIAGNOSIS — F1021 Alcohol dependence, in remission: Secondary | ICD-10-CM | POA: Diagnosis not present

## 2022-10-04 DIAGNOSIS — F411 Generalized anxiety disorder: Secondary | ICD-10-CM | POA: Diagnosis not present

## 2022-10-04 DIAGNOSIS — F4310 Post-traumatic stress disorder, unspecified: Secondary | ICD-10-CM | POA: Diagnosis not present

## 2022-10-04 DIAGNOSIS — F1010 Alcohol abuse, uncomplicated: Secondary | ICD-10-CM | POA: Diagnosis not present

## 2022-10-04 DIAGNOSIS — F330 Major depressive disorder, recurrent, mild: Secondary | ICD-10-CM

## 2022-10-04 NOTE — Progress Notes (Signed)
THERAPIST PROGRESS NOTE  Session Time: 9 a.m. to 10 a.m.  Type of Therapy: Individual   Therapist Response/Interventions: CBT/The therapist encourages Zuzu to retype her resume herself if she does not hear back from her friend so she can move forward with applying for jobs.  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  The therapist emails a module on distress tolerance skills for her to review and talks to her about the other skills associated with DBT. The therapist also emails her an emotion wheel at her request as her Sponsor believes it would be beneficially for Rhonda to check in using the emotion wheel as she did at [employer].   Treatment Goals addressed:  Problem: Dual Diagnosis Goal:  Lauryl will abstain from alcohol 30/30 days per self-report and random breathalyzer by consistently through 01/26/2023. Outcome: Not Applicable Goal: Marrissa's depression and anxiety will improved as evidenced by her PHQ-9 dropping from a 9 to a 4 or less and her GAD-7 dropping to 15 to a 4 or less.  Outcome: Not Applicable  Summary: Annisa returns saying that she went over the ACT paperwork with her Sponsor. She says that she has not started applying for jobs yet as she needs to get her resume updated.   Her PHQ-9 has dropped from an 11 to a 6 and her GAD-7 from a 13 to a 9. She says that she is not back "on the pink cloud" but perhaps on the "porch" noting that she is feeling more content. When asked what she believes caused her mood to improve from where she was on November 7th, she says that she has felt better ever since meeting with this therapist that day and says that she does not believe she should go as long as before without being seen for therapy.  She says that she got good news concerning her aunt as she is to be  released to return home. Rossie says that she picked up her 9 month chip and is still on Step 10. 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    Progress Towards Goals: Progressing  Suicidal/Homicidal: no SI or HI reported  Plan: Return in 3 weeks.   Diagnosis: Alcohol Dependence, Severe in early remission; PTSD; GAD, and Major Depression, Recurrent, Moderate  Collaboration of Care: Other N/A  Patient/Guardian was advised Release of Information must be obtained prior to any record release in order to collaborate their care with an outside provider. Patient/Guardian was advised if they have not already done so to contact the registration department to sign all necessary forms in order for [name] to release information regarding their care.   Consent: Patient/Guardian gives verbal consent for treatment and assignment of benefits for services provided during this visit. Patient/Guardian expressed understanding and agreed to proceed.   Adam Phenix, [place], LCSW, [facility], LCAS 10/04/2022

## 2022-10-04 NOTE — Patient Instructions (Addendum)
Pt presents today for due Vivitrol (Naltrxone) 380 mg injection. Pt is appropriate and cooperative on approach. Affect bright. Congruent with mood. Pt denies any cravings or mood changes since last injection. Pt is feeling positive about job prospects. Denies SI. Injection prepared as ordered and given in [facility] without complaint. Cabella is to return in approximately 4 weeks for next due injection. Pt encouraged to call with any questions or concerns. Pt verbalizes understanding.

## 2022-10-05 DIAGNOSIS — F10230 Alcohol dependence with withdrawal, uncomplicated: Secondary | ICD-10-CM | POA: Diagnosis not present

## 2022-10-06 NOTE — Telephone Encounter (Signed)
Approved.  

## 2022-10-07 NOTE — Telephone Encounter (Signed)
Pt has scheduled her breast MRI for 10/14/2022.

## 2022-10-14 ENCOUNTER — Ambulatory Visit
Admission: RE | Admit: 2022-10-14 | Discharge: 2022-10-14 | Disposition: A | Discharge status: Home / Self Care | Payer: BC Managed Care – PPO | Source: Ambulatory Visit | Attending: Nurse Practitioner | Admitting: Nurse Practitioner

## 2022-10-14 DIAGNOSIS — Z1239 Encounter for other screening for malignant neoplasm of breast: Secondary | ICD-10-CM | POA: Diagnosis not present

## 2022-10-14 DIAGNOSIS — Z9189 Other specified personal risk factors, not elsewhere classified: Secondary | ICD-10-CM

## 2022-10-14 MED ORDER — GADOPICLENOL 0.5 MMOL/ML IV SOLN
7.0000 mL | Freq: Once | INTRAVENOUS | Status: AC | PRN
  Administered 2022-10-14: 7 mL via INTRAVENOUS

## 2022-10-18 DIAGNOSIS — M545 Low back pain, unspecified: Secondary | ICD-10-CM | POA: Diagnosis not present

## 2022-10-18 DIAGNOSIS — Z6825 Body mass index (BMI) 25.0-25.9, adult: Secondary | ICD-10-CM | POA: Diagnosis not present

## 2022-10-18 DIAGNOSIS — E663 Overweight: Secondary | ICD-10-CM | POA: Diagnosis not present

## 2022-10-18 DIAGNOSIS — F988 Other specified behavioral and emotional disorders with onset usually occurring in childhood and adolescence: Secondary | ICD-10-CM | POA: Diagnosis not present

## 2022-10-19 DIAGNOSIS — F1010 Alcohol abuse, uncomplicated: Secondary | ICD-10-CM | POA: Diagnosis not present

## 2022-10-19 DIAGNOSIS — F411 Generalized anxiety disorder: Secondary | ICD-10-CM | POA: Diagnosis not present

## 2022-10-19 DIAGNOSIS — F321 Major depressive disorder, single episode, moderate: Secondary | ICD-10-CM | POA: Diagnosis not present

## 2022-10-19 DIAGNOSIS — F4310 Post-traumatic stress disorder, unspecified: Secondary | ICD-10-CM | POA: Diagnosis not present

## 2022-10-25 ENCOUNTER — Other Ambulatory Visit (HOSPITAL_COMMUNITY): Payer: Self-pay | Admitting: Medical

## 2022-10-25 ENCOUNTER — Ambulatory Visit (INDEPENDENT_AMBULATORY_CARE_PROVIDER_SITE_OTHER): Payer: BC Managed Care – PPO | Admitting: Licensed Clinical Social Worker

## 2022-10-25 DIAGNOSIS — F1021 Alcohol dependence, in remission: Secondary | ICD-10-CM

## 2022-10-25 DIAGNOSIS — F1010 Alcohol abuse, uncomplicated: Secondary | ICD-10-CM | POA: Diagnosis not present

## 2022-10-25 DIAGNOSIS — F4310 Post-traumatic stress disorder, unspecified: Secondary | ICD-10-CM | POA: Diagnosis not present

## 2022-10-25 DIAGNOSIS — F321 Major depressive disorder, single episode, moderate: Secondary | ICD-10-CM | POA: Diagnosis not present

## 2022-10-25 DIAGNOSIS — F411 Generalized anxiety disorder: Secondary | ICD-10-CM | POA: Diagnosis not present

## 2022-10-25 DIAGNOSIS — F331 Major depressive disorder, recurrent, moderate: Secondary | ICD-10-CM

## 2022-10-25 NOTE — Progress Notes (Signed)
THERAPIST PROGRESS NOTE  Session Time: 9 a.m. to 10 a.m.  Type of Therapy: Individual   Therapist Response/Interventions: Solution-Focused/The therapist makes the observation that Kimberly [name] husband talks about his intent to win her back while acting in ways that are not congruent in doing so such as telling her to move out, telling their sons about the separation without consulting her, saying that he will come to her meetings to essentially stalk her, etcetera.  The therapist notes that Kimberly [name] may need to consult with an attorney at some point in the near future and notes that if he continues to touch her without her consent that she could potentially file assault charges.   He provides support and encouragement.   Treatment Goals addressed:  Problem: Dual Diagnosis Goal:  Kimberly [name] will abstain from alcohol 30/30 days per self-report and random breathalyzer by consistently through 01/26/2023. Outcome: Not Applicable Goal: Kimberly [name]'s depression and anxiety will improved as evidenced by her PHQ-9 dropping from a 9 to a 4 or less and her GAD-7 dropping to 15 to a 4 or less.  Outcome: Not Applicable  Summary: Kimberly [name] presents saying that she found the Distress Tolerance material very helpful and asks for a copy of the emotion wheel as well which the therapist provides to her.  Kimberly [name] reports that her depression and anxiety are both currently at 5 or 6. On a positive note, she has ten months of sobriety as of this past weekend and has taken on a Sponsee per her Sponsor's request.  Yesterday, she separated from her husband though plans on living in the house while sleeping on an air mattress until she can afford a place to go. She is going to a friend's to work on a resume today.  She notes that prior to this that she told her husband that she did not want to have sex but when he insisted she told him to go ahead if he needed to which he did knowing that she did not want to. After she told him  that she wanted to separate, he told her that he was going to try and win her back. At the same time, he told Kimberly [name] that he "did not deserve this" and told their sons about the separation without consulting with Kimberly [name] about this and not taking into account that it is only a week before Xmas. He then told her that there had better not be anyone else going through her phone when she gave it to him to look at pictures and saying that he wanted to start attending her meetings apparently to see who she is talking to. He then told her that he hoped she did not think that she would get half of everything and at one point suggested that she move out.    Progress Towards Goals: Progressing  Suicidal/Homicidal: no SI or HI reported  Plan: Return in 1 week  Diagnosis: Alcohol Dependence, Severe in early remission; PTSD; GAD, and Major Depression, Recurrent, Moderate  Collaboration of Care: Other N/A  Patient/Guardian was advised Release of Information must be obtained prior to any record release in order to collaborate their care with an outside provider. Patient/Guardian was advised if they have not already done so to contact the registration department to sign all necessary forms in order for [name] to release information regarding their care.   Consent: Patient/Guardian gives verbal consent for treatment and assignment of benefits for services provided during this visit. Patient/Guardian expressed understanding and agreed to proceed.  Adam Phenix, [place], LCSW, [facility], [place] 10/25/2022

## 2022-10-30 ENCOUNTER — Other Ambulatory Visit (HOSPITAL_COMMUNITY): Payer: Self-pay | Admitting: Medical

## 2022-11-01 ENCOUNTER — Ambulatory Visit (HOSPITAL_COMMUNITY): Payer: BC Managed Care – PPO | Admitting: Licensed Clinical Social Worker

## 2022-11-01 ENCOUNTER — Telehealth (HOSPITAL_COMMUNITY): Payer: Self-pay | Admitting: Licensed Clinical Social Worker

## 2022-11-01 ENCOUNTER — Ambulatory Visit (HOSPITAL_COMMUNITY): Payer: BC Managed Care – PPO | Admitting: *Deleted

## 2022-11-01 NOTE — Telephone Encounter (Signed)
The therapist receives the following email from Ciales today at 9:20 a.m.:  "Kimberly [name]. I'm so sorry, I thought today was Monday!!! Can we try to reschedule for next week? And maybe my shot also? Again I'm so sorry."    The therapist sends a new email with the subject line, "Appointment" and the following:  "Good Morning,  Your appointment is now 11/08/2022 at 9 a.m.   Thanks,"   Adam Phenix, [place], LCSW, [facility], [place] 11/01/2022

## 2022-11-02 ENCOUNTER — Ambulatory Visit (HOSPITAL_COMMUNITY): Payer: BC Managed Care – PPO

## 2022-11-03 NOTE — Telephone Encounter (Signed)
aftercare

## 2022-11-03 NOTE — Telephone Encounter (Signed)
Ongoing aftercare

## 2022-11-08 ENCOUNTER — Ambulatory Visit (INDEPENDENT_AMBULATORY_CARE_PROVIDER_SITE_OTHER): Payer: BC Managed Care – PPO | Admitting: Licensed Clinical Social Worker

## 2022-11-08 ENCOUNTER — Encounter (HOSPITAL_COMMUNITY): Payer: Self-pay

## 2022-11-08 ENCOUNTER — Ambulatory Visit (HOSPITAL_BASED_OUTPATIENT_CLINIC_OR_DEPARTMENT_OTHER): Payer: BC Managed Care – PPO | Admitting: *Deleted

## 2022-11-08 VITALS — BP 121/88 | HR 93 | Resp 16 | Ht 67.0 in | Wt 159.3 lb

## 2022-11-08 DIAGNOSIS — F321 Major depressive disorder, single episode, moderate: Secondary | ICD-10-CM | POA: Diagnosis not present

## 2022-11-08 DIAGNOSIS — F4310 Post-traumatic stress disorder, unspecified: Secondary | ICD-10-CM | POA: Diagnosis not present

## 2022-11-08 DIAGNOSIS — F411 Generalized anxiety disorder: Secondary | ICD-10-CM

## 2022-11-08 DIAGNOSIS — F1021 Alcohol dependence, in remission: Secondary | ICD-10-CM | POA: Diagnosis not present

## 2022-11-08 DIAGNOSIS — F331 Major depressive disorder, recurrent, moderate: Secondary | ICD-10-CM

## 2022-11-08 DIAGNOSIS — F1010 Alcohol abuse, uncomplicated: Secondary | ICD-10-CM | POA: Diagnosis not present

## 2022-11-08 NOTE — Progress Notes (Signed)
Patient arrived for injection of Vivitrol '380mg'$ . Given in Left upper outer quadrant without issues. States she has had no side effects, bright cheerful affect. Denies SI/HI or AV hallucinations. No issues or complaints. Tolerated well.

## 2022-11-08 NOTE — Progress Notes (Signed)
THERAPIST PROGRESS NOTE  Session Time: 9 a.m. to 10 a.m.  Type of Therapy: Individual   Therapist Response/Interventions: Solution-Focused/The therapist questions if Ceilidh has asked her husband about putting her on the deed of the house which has been a major point of contention along with the abortion.  The therapist informs Krysia that her dog is still a puppy at 8 months and recommends that she train the dog and read the book Mother Knows Best: Nature's Way to [place].  He validates her position of hoping for the best but being prepared as well for things to possibly revert to how they were previously.  Treatment Goals addressed:  Problem: Dual Diagnosis Goal:  Romana will abstain from alcohol 30/30 days per self-report and random breathalyzer by consistently through 01/26/2023. Outcome: Not Applicable Goal: Elizabethann's depression and anxiety will improved as evidenced by her PHQ-9 dropping from a 9 to a 4 or less and her GAD-7 dropping to 15 to a 4 or less.  Outcome: Not Applicable  Summary: [name] presents reporting no change in her mood from the last session continuing to rate her depression and anxiety at a 5 or 6.   She says that things with her husband have taken a 180 in that he talked to some friends who are religious and is now admitting that he is to blame for how things are in their marriage and even told his own parents about the abortion that he forced Nikia to have. Jalee says that her father confronted her husband and his behavior including how he caused them to lose a grandchild that they wanted.  Gurleen says that her husband is willing to see a therapist and she will help him to find one. He has attended a couple of speaker meetings with her and planned a romantic getaway for them at [facility] and has even proposed their adopting a child. Bennett told him that she does not want to adopt a child but did opt to get an 87 month old dog; however, the dog does  not come when they are outside such that her husband has talked about Clydine's returning the dog as he does not want more stress on her.  Andreanna printed up something on-line making the dog an emotional support animal requesting a letter from this therapist which he informs her that he is unfortunately unable to write for her as Nyana has functioned fine previously without this dog and he cannot write such a letter simply for the convenience of her being able to take the dog places with her.    Progress Towards Goals: Progressing  Suicidal/Homicidal: no SI or HI reported  Plan: Return in 2 weeks  Diagnosis: Alcohol Dependence, Severe in early remission; PTSD; GAD, and Major Depression, Recurrent, Moderate  Collaboration of Care: Other N/A  Patient/Guardian was advised Release of Information must be obtained prior to any record release in order to collaborate their care with an outside provider. Patient/Guardian was advised if they have not already done so to contact the registration department to sign all necessary forms in order for [name] to release information regarding their care.   Consent: Patient/Guardian gives verbal consent for treatment and assignment of benefits for services provided during this visit. Patient/Guardian expressed understanding and agreed to proceed.   Adam Phenix, [place], LCSW, [facility], [place] 11/08/2022

## 2022-11-15 DIAGNOSIS — F411 Generalized anxiety disorder: Secondary | ICD-10-CM | POA: Diagnosis not present

## 2022-11-15 DIAGNOSIS — F4310 Post-traumatic stress disorder, unspecified: Secondary | ICD-10-CM | POA: Diagnosis not present

## 2022-11-15 DIAGNOSIS — F321 Major depressive disorder, single episode, moderate: Secondary | ICD-10-CM | POA: Diagnosis not present

## 2022-11-15 DIAGNOSIS — F1010 Alcohol abuse, uncomplicated: Secondary | ICD-10-CM | POA: Diagnosis not present

## 2022-11-18 DIAGNOSIS — M47816 Spondylosis without myelopathy or radiculopathy, lumbar region: Secondary | ICD-10-CM | POA: Diagnosis not present

## 2022-11-18 DIAGNOSIS — M47817 Spondylosis without myelopathy or radiculopathy, lumbosacral region: Secondary | ICD-10-CM | POA: Diagnosis not present

## 2022-11-18 DIAGNOSIS — M4807 Spinal stenosis, lumbosacral region: Secondary | ICD-10-CM | POA: Diagnosis not present

## 2022-11-18 DIAGNOSIS — M5127 Other intervertebral disc displacement, lumbosacral region: Secondary | ICD-10-CM | POA: Diagnosis not present

## 2022-11-22 ENCOUNTER — Ambulatory Visit (HOSPITAL_COMMUNITY): Payer: BC Managed Care – PPO | Admitting: Licensed Clinical Social Worker

## 2022-11-22 DIAGNOSIS — F1010 Alcohol abuse, uncomplicated: Secondary | ICD-10-CM | POA: Diagnosis not present

## 2022-11-22 DIAGNOSIS — F4310 Post-traumatic stress disorder, unspecified: Secondary | ICD-10-CM | POA: Diagnosis not present

## 2022-11-22 DIAGNOSIS — F411 Generalized anxiety disorder: Secondary | ICD-10-CM | POA: Diagnosis not present

## 2022-11-22 DIAGNOSIS — F321 Major depressive disorder, single episode, moderate: Secondary | ICD-10-CM | POA: Diagnosis not present

## 2022-11-28 ENCOUNTER — Ambulatory Visit (HOSPITAL_COMMUNITY): Payer: BC Managed Care – PPO | Admitting: Licensed Clinical Social Worker

## 2022-11-29 ENCOUNTER — Ambulatory Visit (HOSPITAL_COMMUNITY): Payer: BC Managed Care – PPO | Admitting: Licensed Clinical Social Worker

## 2022-11-29 DIAGNOSIS — F411 Generalized anxiety disorder: Secondary | ICD-10-CM | POA: Diagnosis not present

## 2022-11-29 DIAGNOSIS — F321 Major depressive disorder, single episode, moderate: Secondary | ICD-10-CM | POA: Diagnosis not present

## 2022-11-29 DIAGNOSIS — F4310 Post-traumatic stress disorder, unspecified: Secondary | ICD-10-CM | POA: Diagnosis not present

## 2022-11-29 DIAGNOSIS — F1010 Alcohol abuse, uncomplicated: Secondary | ICD-10-CM | POA: Diagnosis not present

## 2022-12-01 ENCOUNTER — Ambulatory Visit (INDEPENDENT_AMBULATORY_CARE_PROVIDER_SITE_OTHER): Payer: BC Managed Care – PPO | Admitting: Licensed Clinical Social Worker

## 2022-12-01 DIAGNOSIS — F1021 Alcohol dependence, in remission: Secondary | ICD-10-CM

## 2022-12-01 DIAGNOSIS — F330 Major depressive disorder, recurrent, mild: Secondary | ICD-10-CM

## 2022-12-01 DIAGNOSIS — F411 Generalized anxiety disorder: Secondary | ICD-10-CM

## 2022-12-01 NOTE — Progress Notes (Signed)
THERAPIST PROGRESS NOTE  Session Time: 9 a.m. to 10 a.m.  Type of Therapy: Individual   Therapist Response/Interventions: CBT/The therapist normalizes the using dreams noting that it is possible that she will never stop having them and that they can be triggered by periods of stress.  The therapist validtes that it is good that she told on herself while addressing her complacency and what it is that her disease is telling her that makes her want to stay home versus attending meetings as she did before though she still does attend.  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   Treatment Goals addressed:  Problem: Dual Diagnosis Goal:  Lanika will abstain from alcohol 30/30 days per self-report and random breathalyzer by consistently through 01/26/2023. Outcome: Not Applicable Goal: Aireonna's depression and anxiety will improved as evidenced by her PHQ-9 dropping from a 9 to a 4 or less and her GAD-7 dropping to 15 to a 4 or less.  Outcome: Not Applicable  Summary: Annica presents saying that her depression and anxiety have dropped from a 5 or 6 down to a 3 or 4. She says that things are still going  well with her husband who is now seeing his own therapist.   Simara has 11 months of sobriety and is preparing for her one year celebration. She talks about being surprised that she has started having some using dreams some that have left her a bit shaken. Also, she says that she told on herself in letting her Sponsor know that unlike before, she is wanting to stay home and not go to meetings. She continues to work with her [name].  She says that she is worried about her brother who has a number of issues including an apparent alcohol use disorder. She says that she still has her dog who accompanies her to meetings  and is a good support.    Progress Towards Goals: Progressing  Suicidal/Homicidal: no SI or HI reported  Plan: Return in 3 weeks  Diagnosis: Alcohol Dependence, Severe in early remission; PTSD; GAD, and Major Depression, Recurrent, Moderate  Collaboration of Care: Other N/A  Patient/Guardian was advised Release of Information must be obtained prior to any record release in order to collaborate their care with an outside provider. Patient/Guardian was advised if they have not already done so to contact the registration department to sign all necessary forms in order for [name] to release information regarding their care.   Consent: Patient/Guardian gives verbal consent for treatment and assignment of benefits for services provided during this visit. Patient/Guardian expressed understanding and agreed to proceed.   Adam Phenix, [place], LCSW, [facility], LCAS 12/01/2022

## 2022-12-02 DIAGNOSIS — F10230 Alcohol dependence with withdrawal, uncomplicated: Secondary | ICD-10-CM | POA: Diagnosis not present

## 2022-12-05 DIAGNOSIS — F4310 Post-traumatic stress disorder, unspecified: Secondary | ICD-10-CM | POA: Diagnosis not present

## 2022-12-05 DIAGNOSIS — F411 Generalized anxiety disorder: Secondary | ICD-10-CM | POA: Diagnosis not present

## 2022-12-05 DIAGNOSIS — F321 Major depressive disorder, single episode, moderate: Secondary | ICD-10-CM | POA: Diagnosis not present

## 2022-12-05 DIAGNOSIS — F1010 Alcohol abuse, uncomplicated: Secondary | ICD-10-CM | POA: Diagnosis not present

## 2022-12-06 ENCOUNTER — Ambulatory Visit (HOSPITAL_COMMUNITY): Payer: BC Managed Care – PPO

## 2022-12-06 DIAGNOSIS — M5417 Radiculopathy, lumbosacral region: Secondary | ICD-10-CM | POA: Diagnosis not present

## 2022-12-06 DIAGNOSIS — M461 Sacroiliitis, not elsewhere classified: Secondary | ICD-10-CM | POA: Diagnosis not present

## 2022-12-12 ENCOUNTER — Ambulatory Visit (INDEPENDENT_AMBULATORY_CARE_PROVIDER_SITE_OTHER): Payer: BC Managed Care – PPO | Admitting: Obstetrics and Gynecology

## 2022-12-12 ENCOUNTER — Encounter: Payer: Self-pay | Admitting: Obstetrics and Gynecology

## 2022-12-12 ENCOUNTER — Other Ambulatory Visit (HOSPITAL_COMMUNITY)
Admission: RE | Admit: 2022-12-12 | Discharge: 2022-12-12 | Disposition: A | Discharge status: Home / Self Care | Payer: BC Managed Care – PPO | Source: Ambulatory Visit | Attending: Obstetrics and Gynecology | Admitting: Obstetrics and Gynecology

## 2022-12-12 VITALS — BP 126/84 | HR 110 | Ht 67.0 in | Wt 160.0 lb

## 2022-12-12 DIAGNOSIS — R350 Frequency of micturition: Secondary | ICD-10-CM | POA: Diagnosis not present

## 2022-12-12 DIAGNOSIS — N921 Excessive and frequent menstruation with irregular cycle: Secondary | ICD-10-CM

## 2022-12-12 DIAGNOSIS — Z113 Encounter for screening for infections with a predominantly sexual mode of transmission: Secondary | ICD-10-CM

## 2022-12-12 DIAGNOSIS — N941 Unspecified dyspareunia: Secondary | ICD-10-CM

## 2022-12-12 LAB — URINALYSIS, COMPLETE W/RFL CULTURE
Bacteria, UA: NONE SEEN /HPF
Bilirubin Urine: NEGATIVE
Glucose, UA: NEGATIVE
Hgb urine dipstick: NEGATIVE
Hyaline Cast: NONE SEEN /LPF
Leukocyte Esterase: NEGATIVE
Nitrites, Initial: NEGATIVE
Protein, ur: NEGATIVE
RBC / HPF: NONE SEEN /HPF (ref 0–2)
Specific Gravity, Urine: 1.025 (ref 1.001–1.035)
WBC, UA: NONE SEEN /HPF (ref 0–5)
pH: 5.5 (ref 5.0–8.0)

## 2022-12-12 LAB — PREGNANCY, URINE: Preg Test, Ur: NEGATIVE

## 2022-12-12 LAB — NO CULTURE INDICATED

## 2022-12-12 MED ORDER — MEDROXYPROGESTERONE ACETATE 10 MG PO TABS: 10.0000 mg | ORAL_TABLET | Freq: Every day | ORAL | 0 refills | Status: DC

## 2022-12-12 NOTE — Progress Notes (Signed)
GYNECOLOGY  VISIT   HPI: 37 y.o.   Married  Caucasian  female   G1P0102 with Patient's last menstrual period was 11/30/2022.   here for   continued bleeding. Started December 18th, stopped for a week and then spotted for a few days. Started again Jan 9th and Jan 24th with a full period for both.   Last normal period was in November 22, December 18, January 9, January 19 spotting and January 24 - heavy flow.  Last day of bleeding was 12/07/22.  Having some pain, cramping, and pressure. Sex is also painful.   Feels like her cervix was painful with sex.   No partner change.   Usually menses every 27 - 29 days.   Mother had menopause at age 53.   Not taking any hormonal medication.   Some stress, which has lessened.   Hx UTIs.  Having urinary frequency.   Going to [employer] for the weekend.  GYNECOLOGIC HISTORY: Patient's last menstrual period was 11/30/2022. Contraception:  BTL Menopausal hormone therapy:  n/a Last mammogram:  03/03/22 Breast Density Category C, BI-RADS CATEGORY 2 Benign Last pap smear:   07/03/19 normal, neg HR HPV.         OB History     Gravida  1   Para  1   Term  0   Preterm  1   AB  0   Living  2      SAB  0   IAB  0   Ectopic  0   Multiple  1   Live Births  2              Patient Active Problem List   Diagnosis Date Noted   Alcohol use disorder, severe, in early remission, dependence ([place]) 10/04/2022   Rapidly progressive ascending paresthesias 12/03/2021   Hypomagnesemia 66/04/3015   Folic acid deficiency 11/15/3233   Polyneuropathy 12/02/2021   History of Guillain-Barre syndrome January 2020 12/02/2021   Stress fracture of metatarsal bone of left foot 06/17/2020   Pain in right knee 12/23/2019   Contusion of right knee 12/23/2019   Pain of joint of left ankle and foot 05/02/2019   Vitamin B1 deficiency 02/12/2019   Polysubstance abuse ([place]) 02/12/2019   Neuropathy 12/14/2018   Guillain Barr syndrome ([place])  12/14/2018   History of dry beriberi January 2020 12/06/2018   Tobacco abuse 12/06/2018   Hyperglycemia 57/32/2025   Metabolic acidosis 42/70/6237   IBS (irritable bowel syndrome) 12/06/2018   Constipation 12/06/2018   GERD (gastroesophageal reflux disease) 12/06/2018   AIDP (acute inflammatory demyelinating polyneuropathy) ([place]) 11/19/2018   Weakness of both lower extremities 11/16/2018   Hypokalemia/hypomagnesemia 11/16/2018   Anxiety/depression 11/16/2018   Acute pharyngitis 10/01/2013   Depo-Provera contraceptive status 05/17/2013   Postpartum depression 10/09/2012   Lactation suppression 10/09/2012   Tachycardia 03/09/2012   HSV-1 infection     Past Medical History:  Diagnosis Date   Abnormal Pap smear    ADD (attention deficit disorder)    Anxiety    Guillain Barr syndrome (HCC)    Headache(784.0)    HSV-1 infection    IBS (irritable bowel syndrome)    Knee hyperextension injury 2020   Left, being treated by ortho   Tachycardia     Past Surgical History:  Procedure Laterality Date   BREAST BIOPSY Right    BREAST SURGERY     Biopsy-benign   CESAREAN SECTION  09/13/2012   Procedure: CESAREAN SECTION;  Surgeon: Lavonia Drafts, MD;  Location: [facility];  Service: Obstetrics;  Laterality: N/A;   cortisone shot R wrist Right 2020   KYPHOPLASTY N/A 08/27/2013   Procedure: Thoracic Twelve Kyphoplasty;  Surgeon: Erline Levine, MD;  Location: [facility];  Service: Neurosurgery;  Laterality: N/A;  T12 Kyphoplasty   LAPAROSCOPIC TUBAL LIGATION Bilateral 10/21/2019   Procedure: LAPAROSCOPIC TUBAL LIGATION WITH CAUTERY;  Surgeon: Princess Bruins, MD;  Location: [facility];  Service: Gynecology;  Laterality: Bilateral;  Request to follow 2nd case in [place] block requests one hour   WISDOM TOOTH EXTRACTION     x4   WRIST SURGERY Right 07-23-2013    Current Outpatient Medications  Medication Sig Dispense Refill   baclofen (LIORESAL) 10 MG  tablet TAKE 1 TABLET BY MOUTH THREE TIMES A DAY 270 tablet 1   fluticasone (FLONASE) 50 MCG/ACT nasal spray Place 1 spray into both nostrils every morning.     pantoprazole (PROTONIX) 40 MG tablet Take 1 tablet (40 mg total) by mouth daily. 30 tablet 1   pregabalin (LYRICA) 200 MG capsule TAKE 1 CAPSULE (200 MG TOTAL) BY MOUTH 3 (THREE) TIMES DAILY. 90 capsule 2   traZODone (DESYREL) 50 MG tablet TAKE 1 TABLET BY MOUTH AT BEDTIME AS NEEDED FOR SLEEP 90 tablet 0   VIVITROL 380 MG SUSR INJECT '380MG'$  INTRAMUSCULARLY EVERY 28 DAYS (Patient not taking: Reported on 12/12/2022) 1 each 5   Current Facility-Administered Medications  Medication Dose Route Frequency Provider Last Rate Last Admin   Naltrexone SUSR 380 mg  380 mg Intramuscular Q30 days Dara Hoyer, PA-C   380 mg at 11/08/22 1024     ALLERGIES: Wellbutrin [bupropion] and Tape  Family History  Problem Relation Age of Onset   Breast cancer Mother    Cancer Mother        Pancreatic cancer   Depression Mother        chronic   Hearing loss Mother    Hyperlipidemia Mother    Vision loss Mother        beachets disease   COPD Father    Diabetes Father    Cancer Maternal Grandmother 3       breast cancer   Parkinsonism Maternal Grandmother    Heart disease Maternal Grandmother    Breast cancer Maternal Grandmother    Heart disease Maternal Grandfather    Alzheimer's disease Paternal Grandmother    Heart disease Paternal Grandfather        Died age 30   Nephrolithiasis Paternal Grandfather    Cancer Other 64       breast   Breast cancer Other     Social History   Socioeconomic History   Marital status: Married    Spouse name: Not on file   Number of children: 2   Years of education: Not on file   Highest education level: Not on file  Occupational History   Occupation: In home health   Tobacco Use   Smoking status: Every Day    Packs/day: 0.50    Years: 10.00    Total pack years: 5.00    Types: Cigarettes    Last  attempt to quit: 01/31/2012    Years since quitting: 10.8   Smokeless tobacco: Never   Tobacco comments:    pt has decreased her use to 1-2 cigarettes per day   Vaping Use   Vaping Use: Never used  Substance and Sexual Activity   Alcohol use: Not Currently   Drug use: Never  Sexual activity: Yes    Birth control/protection: Surgical    Comment: 1st intercourse 37 yo-More than 5 partners-BTL  Other Topics Concern   Not on file  Social History Narrative   Lives with husband and twin sons    Social Determinants of Health   Financial Resource Strain: Not on file  Food Insecurity: Not on file  Transportation Needs: Not on file  Physical Activity: Not on file  Stress: Not on file  Social Connections: Not on file  Intimate Partner Violence: Not on file    Review of Systems  All other systems reviewed and are negative.   PHYSICAL EXAMINATION:    BP 126/84 (BP Location: Left Arm, Patient Position: Sitting, Cuff Size: Normal)   Pulse (!) 110   Ht '5\' 7"'$  (1.702 m)   Wt 160 lb (72.6 kg)   LMP 11/30/2022   SpO2 99%   BMI 25.06 kg/m     General appearance: alert, cooperative and appears stated age   Pelvic: External genitalia:  no lesions              Urethra:  normal appearing urethra with no masses, tenderness or lesions              Bartholins and Skenes: normal                 Vagina: normal appearing vagina with normal color and discharge, no lesions              Cervix: no lesions.  No CMT.  No blood today.                 Bimanual Exam:  Uterus:  normal size, contour, position, consistency, mobility, non-tender              Adnexa: no mass, fullness, tenderness on left.  Right sided adnexal tenderness with no mass.               Rectal exam: yes.  Confirms.              Anus:  normal sphincter tone, no lesions  Chaperone was present for exam:  Raquel Sarna.  ASSESSMENT  Menorrhagia with irregular menses.  Dyspareunia.  Urinary frequency.  FH breast and pancreatic  cancer.  PLAN  UPT: negative Urinalysis:   sg 1.025, ph 5.5, NS WBC, NS RBC, NA bacteria, few mucus.  No UC.  CG/CT/trich testing.  Provera 10 mg x 10 days.  Return for pelvic ultrasound and follow up.    An After Visit Summary was printed and given to the patient.  28 min  total time was spent for this patient encounter, including preparation, face-to-face counseling with the patient, coordination of care, and documentation of the encounter.

## 2022-12-13 DIAGNOSIS — F321 Major depressive disorder, single episode, moderate: Secondary | ICD-10-CM | POA: Diagnosis not present

## 2022-12-13 DIAGNOSIS — F4310 Post-traumatic stress disorder, unspecified: Secondary | ICD-10-CM | POA: Diagnosis not present

## 2022-12-13 DIAGNOSIS — F1010 Alcohol abuse, uncomplicated: Secondary | ICD-10-CM | POA: Diagnosis not present

## 2022-12-13 DIAGNOSIS — F411 Generalized anxiety disorder: Secondary | ICD-10-CM | POA: Diagnosis not present

## 2022-12-13 LAB — CERVICOVAGINAL ANCILLARY ONLY
Chlamydia: NEGATIVE
Comment: NEGATIVE
Comment: NEGATIVE
Comment: NORMAL
Neisseria Gonorrhea: NEGATIVE
Trichomonas: NEGATIVE

## 2022-12-19 DIAGNOSIS — M461 Sacroiliitis, not elsewhere classified: Secondary | ICD-10-CM | POA: Diagnosis not present

## 2022-12-21 DIAGNOSIS — F4310 Post-traumatic stress disorder, unspecified: Secondary | ICD-10-CM | POA: Diagnosis not present

## 2022-12-21 DIAGNOSIS — F321 Major depressive disorder, single episode, moderate: Secondary | ICD-10-CM | POA: Diagnosis not present

## 2022-12-21 DIAGNOSIS — F1010 Alcohol abuse, uncomplicated: Secondary | ICD-10-CM | POA: Diagnosis not present

## 2022-12-21 DIAGNOSIS — F411 Generalized anxiety disorder: Secondary | ICD-10-CM | POA: Diagnosis not present

## 2022-12-22 ENCOUNTER — Ambulatory Visit (HOSPITAL_COMMUNITY): Payer: BC Managed Care – PPO | Admitting: Licensed Clinical Social Worker

## 2022-12-22 DIAGNOSIS — F1021 Alcohol dependence, in remission: Secondary | ICD-10-CM

## 2022-12-22 DIAGNOSIS — F4312 Post-traumatic stress disorder, chronic: Secondary | ICD-10-CM

## 2022-12-22 DIAGNOSIS — F411 Generalized anxiety disorder: Secondary | ICD-10-CM

## 2022-12-22 NOTE — Progress Notes (Signed)
THERAPIST PROGRESS NOTE  Session Time: 9 a.m. to 10 a.m.  Type of Therapy: Individual   Therapist Response/Interventions: Solution-Focused/The therapist provides Alexxis on information concerning the use of CBD products explaining the potential risks associated with possibly getting products that contain more that the trace amounts of THC reported and how to find products that do not have this problem.  The therapist provides Maicey with the 24/7 contact number for the [facility] in [facility] to get her son connected to a therapist in-network with his Medicaid.   The therapist explains to Adlemi that she will likely find that in later recovery that in addition to associating with persons in recovery that she will likely broaden her social circle and make friendships with individuals who have no history of substance use and feel comfortable in doing so.   Treatment Goals addressed:  Problem: Dual Diagnosis Goal:  Natoyia will abstain from alcohol 30/30 days per self-report and random breathalyzer by consistently through 01/26/2023. Outcome: Not Applicable Goal: Tenia's depression and anxiety will improved as evidenced by her PHQ-9 dropping from a 9 to a 4 or less and her GAD-7 dropping to 15 to a 4 or less.  Outcome: Not Applicable  Summary: Ellyn presents saying that she will be picking up her one year child on 12/31/22 and is very excited. She says that the trip with her husband to [place] went well and shows this therapist a diamond engagement/wedding ring that her husband purchased for her noting that he had promised a long time ago to replace it.   She asks this therapist about a CBD massage oil she is using and CBD in general in regard to this possibly impacting her sobriety and also asks about how to get her son connected to therapy.   She reports that her mood has been for the most part good and the therapist and Karole talk about what she can look forward to after year one  and the components of being in recovery with one being avoiding unhealthy relationship patterns in addition to remaining sober.    Progress Towards Goals: Progressing  Suicidal/Homicidal: no SI or HI reported  Plan: Return in 4 weeks  Diagnosis: Alcohol Dependence, Severe in early remission; PTSD; GAD, and Major Depression, Recurrent, Moderate  Collaboration of Care: Other N/A  Patient/Guardian was advised Release of Information must be obtained prior to any record release in order to collaborate their care with an outside provider. Patient/Guardian was advised if they have not already done so to contact the registration department to sign all necessary forms in order for [name] to release information regarding their care.   Consent: Patient/Guardian gives verbal consent for treatment and assignment of benefits for services provided during this visit. Patient/Guardian expressed understanding and agreed to proceed.   Adam Phenix, [place], LCSW, [facility], [place] 12/22/2022

## 2023-01-03 DIAGNOSIS — F411 Generalized anxiety disorder: Secondary | ICD-10-CM | POA: Diagnosis not present

## 2023-01-03 DIAGNOSIS — F1010 Alcohol abuse, uncomplicated: Secondary | ICD-10-CM | POA: Diagnosis not present

## 2023-01-03 DIAGNOSIS — F321 Major depressive disorder, single episode, moderate: Secondary | ICD-10-CM | POA: Diagnosis not present

## 2023-01-03 DIAGNOSIS — F4310 Post-traumatic stress disorder, unspecified: Secondary | ICD-10-CM | POA: Diagnosis not present

## 2023-01-05 NOTE — Progress Notes (Deleted)
GYNECOLOGY  VISIT   HPI: 37 y.o.   Married  Caucasian  female   G1P0102 with No LMP recorded.   here for   U/S consult  GYNECOLOGIC HISTORY: No LMP recorded. Contraception:  BTL Menopausal hormone therapy:  n/a Last mammogram:  03/03/22 Breast Density Category C, BI-RADS CATEGORY 2 Benign  Last pap smear:   07/03/19 normal, neg HR HPV.          OB History     Gravida  1   Para  1   Term  0   Preterm  1   AB  0   Living  2      SAB  0   IAB  0   Ectopic  0   Multiple  1   Live Births  2              Patient Active Problem List   Diagnosis Date Noted   Alcohol use disorder, severe, in early remission, dependence ([place]) 10/04/2022   Rapidly progressive ascending paresthesias 12/03/2021   Hypomagnesemia 123456   Folic acid deficiency 123456   Polyneuropathy 12/02/2021   History of Guillain-Barre syndrome January 2020 12/02/2021   Stress fracture of metatarsal bone of left foot 06/17/2020   Pain in right knee 12/23/2019   Contusion of right knee 12/23/2019   Pain of joint of left ankle and foot 05/02/2019   Vitamin B1 deficiency 02/12/2019   Polysubstance abuse ([place]) 02/12/2019   Neuropathy 12/14/2018   Guillain Barr syndrome ([place]) 12/14/2018   History of dry beriberi January 2020 12/06/2018   Tobacco abuse 12/06/2018   Hyperglycemia 0000000   Metabolic acidosis 0000000   IBS (irritable bowel syndrome) 12/06/2018   Constipation 12/06/2018   GERD (gastroesophageal reflux disease) 12/06/2018   AIDP (acute inflammatory demyelinating polyneuropathy) ([place]) 11/19/2018   Weakness of both lower extremities 11/16/2018   Hypokalemia/hypomagnesemia 11/16/2018   Anxiety/depression 11/16/2018   Acute pharyngitis 10/01/2013   Depo-Provera contraceptive status 05/17/2013   Postpartum depression 10/09/2012   Lactation suppression 10/09/2012   Tachycardia 03/09/2012   HSV-1 infection     Past Medical History:  Diagnosis Date   Abnormal Pap smear     ADD (attention deficit disorder)    Anxiety    Guillain Barr syndrome (HCC)    Headache(784.0)    HSV-1 infection    IBS (irritable bowel syndrome)    Knee hyperextension injury 2020   Left, being treated by ortho   Tachycardia     Past Surgical History:  Procedure Laterality Date   BREAST BIOPSY Right    BREAST SURGERY     Biopsy-benign   CESAREAN SECTION  09/13/2012   Procedure: CESAREAN SECTION;  Surgeon: Lavonia Drafts, MD;  Location: [facility];  Service: Obstetrics;  Laterality: N/A;   cortisone shot R wrist Right 2020   KYPHOPLASTY N/A 08/27/2013   Procedure: Thoracic Twelve Kyphoplasty;  Surgeon: Erline Levine, MD;  Location: [facility];  Service: Neurosurgery;  Laterality: N/A;  T12 Kyphoplasty   LAPAROSCOPIC TUBAL LIGATION Bilateral 10/21/2019   Procedure: LAPAROSCOPIC TUBAL LIGATION WITH CAUTERY;  Surgeon: Princess Bruins, MD;  Location: [facility];  Service: Gynecology;  Laterality: Bilateral;  Request to follow 2nd case in [place] block requests one hour   WISDOM TOOTH EXTRACTION     x4   WRIST SURGERY Right 07-23-2013    Current Outpatient Medications  Medication Sig Dispense Refill   baclofen (LIORESAL) 10 MG tablet TAKE 1 TABLET BY MOUTH THREE TIMES  A DAY 270 tablet 1   fluticasone (FLONASE) 50 MCG/ACT nasal spray Place 1 spray into both nostrils every morning.     medroxyPROGESTERone (PROVERA) 10 MG tablet Take 1 tablet (10 mg total) by mouth daily. 10 tablet 0   pantoprazole (PROTONIX) 40 MG tablet Take 1 tablet (40 mg total) by mouth daily. 30 tablet 1   pregabalin (LYRICA) 200 MG capsule TAKE 1 CAPSULE (200 MG TOTAL) BY MOUTH 3 (THREE) TIMES DAILY. 90 capsule 2   traZODone (DESYREL) 50 MG tablet TAKE 1 TABLET BY MOUTH AT BEDTIME AS NEEDED FOR SLEEP 90 tablet 0   VIVITROL 380 MG SUSR INJECT '380MG'$  INTRAMUSCULARLY EVERY 28 DAYS (Patient not taking: Reported on 12/12/2022) 1 each 5   Current Facility-Administered Medications   Medication Dose Route Frequency Provider Last Rate Last Admin   Naltrexone SUSR 380 mg  380 mg Intramuscular Q30 days Dara Hoyer, PA-C   380 mg at 11/08/22 1024     ALLERGIES: Wellbutrin [bupropion] and Tape  Family History  Problem Relation Age of Onset   Breast cancer Mother    Cancer Mother        Pancreatic cancer   Depression Mother        chronic   Hearing loss Mother    Hyperlipidemia Mother    Vision loss Mother        beachets disease   COPD Father    Diabetes Father    Cancer Maternal Grandmother 29       breast cancer   Parkinsonism Maternal Grandmother    Heart disease Maternal Grandmother    Breast cancer Maternal Grandmother    Heart disease Maternal Grandfather    Alzheimer's disease Paternal Grandmother    Heart disease Paternal Grandfather        Died age 67   Nephrolithiasis Paternal Grandfather    Cancer Other 23       breast   Breast cancer Other     Social History   Socioeconomic History   Marital status: Married    Spouse name: Not on file   Number of children: 2   Years of education: Not on file   Highest education level: Not on file  Occupational History   Occupation: In home health   Tobacco Use   Smoking status: Every Day    Packs/day: 0.50    Years: 10.00    Total pack years: 5.00    Types: Cigarettes    Last attempt to quit: 01/31/2012    Years since quitting: 10.9   Smokeless tobacco: Never   Tobacco comments:    pt has decreased her use to 1-2 cigarettes per day   Vaping Use   Vaping Use: Never used  Substance and Sexual Activity   Alcohol use: Not Currently   Drug use: Never   Sexual activity: Yes    Birth control/protection: Surgical    Comment: 1st intercourse 37 yo-More than 5 partners-BTL  Other Topics Concern   Not on file  Social History Narrative   Lives with husband and twin sons    Social Determinants of Health   Financial Resource Strain: Not on file  Food Insecurity: Not on file  Transportation  Needs: Not on file  Physical Activity: Not on file  Stress: Not on file  Social Connections: Not on file  Intimate Partner Violence: Not on file    Review of Systems  PHYSICAL EXAMINATION:    There were no vitals taken for this visit.  General appearance: alert, cooperative and appears stated age Head: Normocephalic, without obvious abnormality, atraumatic Neck: no adenopathy, supple, symmetrical, trachea midline and thyroid normal to inspection and palpation Lungs: clear to auscultation bilaterally Breasts: normal appearance, no masses or tenderness, No nipple retraction or dimpling, No nipple discharge or bleeding, No axillary or supraclavicular adenopathy Heart: regular rate and rhythm Abdomen: soft, non-tender, no masses,  no organomegaly Extremities: extremities normal, atraumatic, no cyanosis or edema Skin: Skin color, texture, turgor normal. No rashes or lesions Lymph nodes: Cervical, supraclavicular, and axillary nodes normal. No abnormal inguinal nodes palpated Neurologic: Grossly normal  Pelvic: External genitalia:  no lesions              Urethra:  normal appearing urethra with no masses, tenderness or lesions              Bartholins and Skenes: normal                 Vagina: normal appearing vagina with normal color and discharge, no lesions              Cervix: no lesions                Bimanual Exam:  Uterus:  normal size, contour, position, consistency, mobility, non-tender              Adnexa: no mass, fullness, tenderness              Rectal exam: {yes no:[id number]}.  Confirms.              Anus:  normal sphincter tone, no lesions  Chaperone was present for exam:  ***  ASSESSMENT     PLAN     An After Visit Summary was printed and given to the patient.  ______ minutes face to face time of which over 50% was spent in counseling.

## 2023-01-10 DIAGNOSIS — F411 Generalized anxiety disorder: Secondary | ICD-10-CM | POA: Diagnosis not present

## 2023-01-10 DIAGNOSIS — F321 Major depressive disorder, single episode, moderate: Secondary | ICD-10-CM | POA: Diagnosis not present

## 2023-01-10 DIAGNOSIS — F4310 Post-traumatic stress disorder, unspecified: Secondary | ICD-10-CM | POA: Diagnosis not present

## 2023-01-10 DIAGNOSIS — F1010 Alcohol abuse, uncomplicated: Secondary | ICD-10-CM | POA: Diagnosis not present

## 2023-01-13 ENCOUNTER — Other Ambulatory Visit (HOSPITAL_COMMUNITY): Payer: Self-pay | Admitting: Internal Medicine

## 2023-01-13 ENCOUNTER — Ambulatory Visit (HOSPITAL_COMMUNITY)
Admission: RE | Admit: 2023-01-13 | Discharge: 2023-01-13 | Disposition: A | Discharge status: Home / Self Care | Payer: BC Managed Care – PPO | Source: Ambulatory Visit | Attending: Internal Medicine | Admitting: Internal Medicine

## 2023-01-13 DIAGNOSIS — F988 Other specified behavioral and emotional disorders with onset usually occurring in childhood and adolescence: Secondary | ICD-10-CM | POA: Diagnosis not present

## 2023-01-13 DIAGNOSIS — Z0001 Encounter for general adult medical examination with abnormal findings: Secondary | ICD-10-CM | POA: Diagnosis not present

## 2023-01-13 DIAGNOSIS — M545 Low back pain, unspecified: Secondary | ICD-10-CM | POA: Insufficient documentation

## 2023-01-13 DIAGNOSIS — R1310 Dysphagia, unspecified: Secondary | ICD-10-CM | POA: Diagnosis not present

## 2023-01-13 DIAGNOSIS — K2100 Gastro-esophageal reflux disease with esophagitis, without bleeding: Secondary | ICD-10-CM | POA: Diagnosis not present

## 2023-01-13 DIAGNOSIS — Z6825 Body mass index (BMI) 25.0-25.9, adult: Secondary | ICD-10-CM | POA: Diagnosis not present

## 2023-01-19 ENCOUNTER — Other Ambulatory Visit: Payer: BC Managed Care – PPO

## 2023-01-19 ENCOUNTER — Ambulatory Visit (HOSPITAL_COMMUNITY): Payer: BC Managed Care – PPO | Admitting: Licensed Clinical Social Worker

## 2023-01-19 ENCOUNTER — Other Ambulatory Visit: Payer: BC Managed Care – PPO | Admitting: Obstetrics and Gynecology

## 2023-01-24 DIAGNOSIS — Z6825 Body mass index (BMI) 25.0-25.9, adult: Secondary | ICD-10-CM | POA: Diagnosis not present

## 2023-01-24 DIAGNOSIS — L03039 Cellulitis of unspecified toe: Secondary | ICD-10-CM | POA: Diagnosis not present

## 2023-01-24 DIAGNOSIS — E663 Overweight: Secondary | ICD-10-CM | POA: Diagnosis not present

## 2023-01-26 ENCOUNTER — Ambulatory Visit (INDEPENDENT_AMBULATORY_CARE_PROVIDER_SITE_OTHER): Payer: BC Managed Care – PPO | Admitting: Licensed Clinical Social Worker

## 2023-01-26 ENCOUNTER — Encounter (HOSPITAL_COMMUNITY): Payer: Self-pay

## 2023-01-26 DIAGNOSIS — F1021 Alcohol dependence, in remission: Secondary | ICD-10-CM | POA: Diagnosis not present

## 2023-01-26 DIAGNOSIS — F411 Generalized anxiety disorder: Secondary | ICD-10-CM

## 2023-01-26 DIAGNOSIS — F4312 Post-traumatic stress disorder, chronic: Secondary | ICD-10-CM

## 2023-01-26 NOTE — Progress Notes (Signed)
THERAPIST PROGRESS NOTE  Session Time: 9;05 a.m. a.m. to 10 a.m.  Type of Therapy: Individual   Therapist Response/Interventions: Solution-Focused/The therapist suggests strategies to address her dog chewing problems and observes that complacency seems to be setting in with Leilah and her husband. The therapist recommends that Zaleigh discuss this with her husband which she says she will do understanding that if she does not that she will likely blow-up down the road. The therapist validates Larhonda's feelings related to her Sponsee while at the same time addressing her co-dependent behaviors. The therapist suggests other ways of looking at this situation such that she will be able to stop dwelling on it such that it adversely impacts her mood.  The therapist informs her that she could discuss with her doctor about going from 50 to 100 mg of Trazodone to get more of an anti-anxiety effect during the day.   Treatment Goals addressed:  Active     Alcohol and Anxiety     Andrica will experience a decrease in her anxiety as evidenced by her GAD-7 being a 4 or less dropping from a 10.  (Initial)     Start:  01/26/23    Expected End:  07/29/23         Arista will continue to maintain her sobriety daily per self-report while continuing to attend four to five AA meetings per week while doing service work in the form of having [employer]. (Initial)     Start:  01/26/23    Expected End:  07/29/23           Summary: Kilia presents reporting 13 months of sobriety. Her PHQ-9 is a 4 but her GAD-7 is a 10. She says that she needs to get a job but has not been looking. She takes 50 mg per night and sometimes takes 100 mg on weekends.    She says that they got another dog and is stressed as both dogs are chewing up items throughout the house. When asked if she read the dog training book recommended by this therapist, she said that she had not.  The major focus of this session involves Tonae's  perception that things are going back to how they were previously with her husband. Additionally, Getrude wants to discuss her worries about a woman who she took on as a temporary Sponsee.   Progress Towards Goals: Initial  Suicidal/Homicidal: no SI or HI reported  Plan: Return in 4 weeks  Diagnosis: Alcohol Dependence, Severe in early remission; PTSD; GAD, and Major Depression, Recurrent, Moderate  Collaboration of Care: Other N/A  Patient/Guardian was advised Release of Information must be obtained prior to any record release in order to collaborate their care with an outside provider. Patient/Guardian was advised if they have not already done so to contact the registration department to sign all necessary forms in order for [name] to release information regarding their care.   Consent: Patient/Guardian gives verbal consent for treatment and assignment of benefits for services provided during this visit. Patient/Guardian expressed understanding and agreed to proceed.   Adam Phenix, [place], LCSW, [facility], [place] 01/26/2023

## 2023-02-09 ENCOUNTER — Other Ambulatory Visit (HOSPITAL_COMMUNITY): Payer: Self-pay | Admitting: Medical

## 2023-02-09 NOTE — Telephone Encounter (Signed)
CD IOP Patientin counseling post completion

## 2023-02-14 DIAGNOSIS — F4310 Post-traumatic stress disorder, unspecified: Secondary | ICD-10-CM | POA: Diagnosis not present

## 2023-02-14 DIAGNOSIS — F321 Major depressive disorder, single episode, moderate: Secondary | ICD-10-CM | POA: Diagnosis not present

## 2023-02-14 DIAGNOSIS — F1010 Alcohol abuse, uncomplicated: Secondary | ICD-10-CM | POA: Diagnosis not present

## 2023-02-14 DIAGNOSIS — F411 Generalized anxiety disorder: Secondary | ICD-10-CM | POA: Diagnosis not present

## 2023-02-16 ENCOUNTER — Ambulatory Visit (HOSPITAL_COMMUNITY): Payer: BC Managed Care – PPO | Admitting: Licensed Clinical Social Worker

## 2023-02-16 ENCOUNTER — Telehealth (HOSPITAL_COMMUNITY): Payer: Self-pay | Admitting: Licensed Clinical Social Worker

## 2023-02-16 NOTE — Telephone Encounter (Signed)
The therapist attempts to reach [facility] by phone when she does not show for her 9 a.m. appointment today. He leaves a Architect.  Myrna Blazer, MA, LCSW, [facility], [facility] 02/16/2023

## 2023-02-21 DIAGNOSIS — F4310 Post-traumatic stress disorder, unspecified: Secondary | ICD-10-CM | POA: Diagnosis not present

## 2023-02-21 DIAGNOSIS — F1010 Alcohol abuse, uncomplicated: Secondary | ICD-10-CM | POA: Diagnosis not present

## 2023-02-21 DIAGNOSIS — F411 Generalized anxiety disorder: Secondary | ICD-10-CM | POA: Diagnosis not present

## 2023-02-21 DIAGNOSIS — F321 Major depressive disorder, single episode, moderate: Secondary | ICD-10-CM | POA: Diagnosis not present

## 2023-03-03 DIAGNOSIS — F1010 Alcohol abuse, uncomplicated: Secondary | ICD-10-CM | POA: Diagnosis not present

## 2023-03-03 DIAGNOSIS — F321 Major depressive disorder, single episode, moderate: Secondary | ICD-10-CM | POA: Diagnosis not present

## 2023-03-03 DIAGNOSIS — F4310 Post-traumatic stress disorder, unspecified: Secondary | ICD-10-CM | POA: Diagnosis not present

## 2023-03-03 DIAGNOSIS — F411 Generalized anxiety disorder: Secondary | ICD-10-CM | POA: Diagnosis not present

## 2023-03-15 DIAGNOSIS — F988 Other specified behavioral and emotional disorders with onset usually occurring in childhood and adolescence: Secondary | ICD-10-CM | POA: Diagnosis not present

## 2023-03-15 DIAGNOSIS — F5089 Other specified eating disorder: Secondary | ICD-10-CM | POA: Diagnosis not present

## 2023-04-06 DIAGNOSIS — F4310 Post-traumatic stress disorder, unspecified: Secondary | ICD-10-CM | POA: Diagnosis not present

## 2023-04-06 DIAGNOSIS — F1010 Alcohol abuse, uncomplicated: Secondary | ICD-10-CM | POA: Diagnosis not present

## 2023-04-06 DIAGNOSIS — F411 Generalized anxiety disorder: Secondary | ICD-10-CM | POA: Diagnosis not present

## 2023-04-06 DIAGNOSIS — F321 Major depressive disorder, single episode, moderate: Secondary | ICD-10-CM | POA: Diagnosis not present

## 2023-04-13 DIAGNOSIS — F411 Generalized anxiety disorder: Secondary | ICD-10-CM | POA: Diagnosis not present

## 2023-04-13 DIAGNOSIS — F1010 Alcohol abuse, uncomplicated: Secondary | ICD-10-CM | POA: Diagnosis not present

## 2023-04-13 DIAGNOSIS — F321 Major depressive disorder, single episode, moderate: Secondary | ICD-10-CM | POA: Diagnosis not present

## 2023-04-13 DIAGNOSIS — F4310 Post-traumatic stress disorder, unspecified: Secondary | ICD-10-CM | POA: Diagnosis not present

## 2023-04-18 ENCOUNTER — Ambulatory Visit (INDEPENDENT_AMBULATORY_CARE_PROVIDER_SITE_OTHER): Payer: BC Managed Care – PPO | Admitting: Licensed Clinical Social Worker

## 2023-04-18 DIAGNOSIS — F411 Generalized anxiety disorder: Secondary | ICD-10-CM

## 2023-04-18 DIAGNOSIS — F4312 Post-traumatic stress disorder, chronic: Secondary | ICD-10-CM

## 2023-04-18 DIAGNOSIS — F1021 Alcohol dependence, in remission: Secondary | ICD-10-CM

## 2023-04-18 NOTE — Progress Notes (Signed)
THERAPIST PROGRESS NOTE  Session Time: 9:02 a.m. to 10 a.m.   Type of Therapy: Individual   Therapist Response/Interventions: Solution-Focused/The therapist makes the observation that Kimberly [name] is minimizing her husband's breaking his promise to give her the 4K they were getting in the form of a tax refund while pointing out the role money has played in their relationship.  He suggests that in addition to getting a job in Phlebotomy as she has planned on doing that consulting with an attorney to see what she would be entitled to in the event of a divorce might be a useful course of action as well.   Treatment Goals addressed:  Problem: Dual Diagnosis Goal:  Kimberly [name] will abstain from alcohol 30/30 days per self-report and random breathalyzer by con  Goal: Kimberly [name]'s depression and anxiety will improved as evidenced by her PHQ-9 dropping from a 9 to a 4 or less and her GAD-7 dropping to 15 to a 4 or less.     Summary: Kimberly [name] says that her Sponsor let go of all of her Sponsees so Kimberly [name] had to get a new Sponsor saying that she "kind of has two Sponsors." She is working a 4th Step on "me, myself, and I." She is going to start "putting pen to paper today." She realized that she has been blaming her husband for everything while not looking at her part in it. Kimberly [name] says that the 23rd of this month she will have 16 months of sobriety.  When she spoke in May, she figured out that she had not gotten over everything with her husband. She has come to the conclusion that her heart is not in it; however, she feels bad as he is not doing anything wrong at the present time.  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  The major focus of the session involves Kimberly [name]'s apparent decision that she is going to leave her marriage at some point in the future. She says that she considered waiting six years until her boys were  32; however, her Sponsor told her that she would likely end up drinking if she did this.  Her PHQ-9 today is a 5 and her GAD-7 is 12.    Progress Towards Goals: Not progressing concerning mood but progressing with sobriety  Suicidal/Homicidal: no SI or HI reported  Plan: Return in 4 weeks  Diagnosis: Alcohol Dependence, Severe in early remission; PTSD; GAD, and Major Depression, Recurrent, Moderate  Collaboration of Care: Other N/A  Patient/Guardian was advised Release of Information must be obtained prior to any record release in order to collaborate their care with an outside provider. Patient/Guardian was advised if they have not already done so to contact the registration department to sign all necessary forms in order for [name] to release information regarding their care.   Consent: Patient/Guardian gives verbal consent for treatment and assignment of benefits for services provided during this visit. Patient/Guardian expressed understanding and agreed to proceed.   Myrna Blazer, MA, LCSW, [facility], LCAS 04/18/2023

## 2023-04-21 DIAGNOSIS — F4310 Post-traumatic stress disorder, unspecified: Secondary | ICD-10-CM | POA: Diagnosis not present

## 2023-04-21 DIAGNOSIS — F1010 Alcohol abuse, uncomplicated: Secondary | ICD-10-CM | POA: Diagnosis not present

## 2023-04-21 DIAGNOSIS — F321 Major depressive disorder, single episode, moderate: Secondary | ICD-10-CM | POA: Diagnosis not present

## 2023-04-21 DIAGNOSIS — F411 Generalized anxiety disorder: Secondary | ICD-10-CM | POA: Diagnosis not present

## 2023-04-28 DIAGNOSIS — F411 Generalized anxiety disorder: Secondary | ICD-10-CM | POA: Diagnosis not present

## 2023-04-28 DIAGNOSIS — F1010 Alcohol abuse, uncomplicated: Secondary | ICD-10-CM | POA: Diagnosis not present

## 2023-04-28 DIAGNOSIS — F321 Major depressive disorder, single episode, moderate: Secondary | ICD-10-CM | POA: Diagnosis not present

## 2023-04-28 DIAGNOSIS — F4310 Post-traumatic stress disorder, unspecified: Secondary | ICD-10-CM | POA: Diagnosis not present

## 2023-05-01 DIAGNOSIS — F321 Major depressive disorder, single episode, moderate: Secondary | ICD-10-CM | POA: Diagnosis not present

## 2023-05-01 DIAGNOSIS — F1010 Alcohol abuse, uncomplicated: Secondary | ICD-10-CM | POA: Diagnosis not present

## 2023-05-01 DIAGNOSIS — F4310 Post-traumatic stress disorder, unspecified: Secondary | ICD-10-CM | POA: Diagnosis not present

## 2023-05-01 DIAGNOSIS — F411 Generalized anxiety disorder: Secondary | ICD-10-CM | POA: Diagnosis not present

## 2023-05-09 DIAGNOSIS — F1010 Alcohol abuse, uncomplicated: Secondary | ICD-10-CM | POA: Diagnosis not present

## 2023-05-09 DIAGNOSIS — F321 Major depressive disorder, single episode, moderate: Secondary | ICD-10-CM | POA: Diagnosis not present

## 2023-05-09 DIAGNOSIS — F4310 Post-traumatic stress disorder, unspecified: Secondary | ICD-10-CM | POA: Diagnosis not present

## 2023-05-09 DIAGNOSIS — F411 Generalized anxiety disorder: Secondary | ICD-10-CM | POA: Diagnosis not present

## 2023-05-10 ENCOUNTER — Other Ambulatory Visit (HOSPITAL_COMMUNITY): Payer: Self-pay | Admitting: Medical

## 2023-05-16 ENCOUNTER — Ambulatory Visit (INDEPENDENT_AMBULATORY_CARE_PROVIDER_SITE_OTHER): Payer: BC Managed Care – PPO | Admitting: Licensed Clinical Social Worker

## 2023-05-16 DIAGNOSIS — F411 Generalized anxiety disorder: Secondary | ICD-10-CM

## 2023-05-16 DIAGNOSIS — F1021 Alcohol dependence, in remission: Secondary | ICD-10-CM | POA: Diagnosis not present

## 2023-05-16 DIAGNOSIS — F4312 Post-traumatic stress disorder, chronic: Secondary | ICD-10-CM

## 2023-05-16 NOTE — Progress Notes (Signed)
THERAPIST PROGRESS NOTE  Session Time: 9:05 a.m. to 9:58 a.m.   Type of Therapy: Individual   Therapist Response/Interventions: Solution-Focused and CBT/The therapist suggests that Alechia has likely not had the conversation with her husband about the house suspecting he will take the same position as he always had which will force her to act; however, if she does not have this conversation, then she can imagine that he could possibly refinance meaning that she can remain in limbo.  The therapist validates that getting out of a marriage is stressful and takes a great deal of energy while at the same time questioning how staying in this situation could impact her recovery long-term as her previous Sponsor questioned.  The therapist suggests that Mame could meet with a divorce attorney for an informational meeting only so she can know where she will stand financially if she were to leave such that she will have more information in making her decision.   Treatment Goals addressed:  Problem: Dual Diagnosis Goal:  Aubryn will abstain from alcohol 30/30 days per self-report and random breathalyzer by con  Goal: Jakaiya's depression and anxiety will improved as evidenced by her PHQ-9 dropping from a 9 to a 4 or less and her GAD-7 dropping to 15 to a 4 or less.     Summary: Shay presents reporting slight improvement in her depression and no change in her anxiety. She reports no change in her sobriety and shared her 4th Step with her new Sponsor noting that it went well.  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   The major focus of the session involves Arshia's continued position that her marriage cannot be saved while at the same time being unable to act in terms of getting out of it. She talks about having some guilt as her husband has changed some and fears about  not being able to pay rent if on her own.   She says that her current Sponsor has encouraged Jacklynn to talk to her husband about getting the house refinanced such that his ex's name is taken off the deed and Electra's name is put on so that her sons will be able to inherit this. Naiya has procrastinated in having this conversation.    Progress Towards Goals: some progress concerning her depression but not her anxiety; she continues to remain sober  Suicidal/Homicidal: no SI or HI reported  Plan: Return in 4 weeks  Diagnosis: Alcohol Dependence, Severe in early remission; PTSD; GAD, and Major Depression, Recurrent, Moderate  Collaboration of Care: Other N/A  Patient/Guardian was advised Release of Information must be obtained prior to any record release in order to collaborate their care with an outside provider. Patient/Guardian was advised if they have not already done so to contact the registration department to sign all necessary forms in order for [name] to release information regarding their care.   Consent: Patient/Guardian gives verbal consent for treatment and assignment of benefits for services provided during this visit. Patient/Guardian expressed understanding and agreed to proceed.   Myrna Blazer, MA, LCSW, [facility], LCAS 05/16/2023

## 2023-05-18 NOTE — Telephone Encounter (Signed)
Pt no longer on this service except for Naltrexone injectionPCP provides this

## 2023-05-25 DIAGNOSIS — F411 Generalized anxiety disorder: Secondary | ICD-10-CM | POA: Diagnosis not present

## 2023-05-25 DIAGNOSIS — F321 Major depressive disorder, single episode, moderate: Secondary | ICD-10-CM | POA: Diagnosis not present

## 2023-05-25 DIAGNOSIS — F1010 Alcohol abuse, uncomplicated: Secondary | ICD-10-CM | POA: Diagnosis not present

## 2023-05-25 DIAGNOSIS — F4310 Post-traumatic stress disorder, unspecified: Secondary | ICD-10-CM | POA: Diagnosis not present

## 2023-06-13 ENCOUNTER — Ambulatory Visit (HOSPITAL_COMMUNITY): Payer: BC Managed Care – PPO | Admitting: Licensed Clinical Social Worker

## 2023-06-20 DIAGNOSIS — F4310 Post-traumatic stress disorder, unspecified: Secondary | ICD-10-CM | POA: Diagnosis not present

## 2023-06-20 DIAGNOSIS — F321 Major depressive disorder, single episode, moderate: Secondary | ICD-10-CM | POA: Diagnosis not present

## 2023-06-20 DIAGNOSIS — F411 Generalized anxiety disorder: Secondary | ICD-10-CM | POA: Diagnosis not present

## 2023-06-20 DIAGNOSIS — F1010 Alcohol abuse, uncomplicated: Secondary | ICD-10-CM | POA: Diagnosis not present

## 2023-06-21 DIAGNOSIS — H18832 Recurrent erosion of cornea, left eye: Secondary | ICD-10-CM | POA: Diagnosis not present

## 2023-06-26 DIAGNOSIS — F988 Other specified behavioral and emotional disorders with onset usually occurring in childhood and adolescence: Secondary | ICD-10-CM | POA: Diagnosis not present

## 2023-06-26 DIAGNOSIS — F419 Anxiety disorder, unspecified: Secondary | ICD-10-CM | POA: Diagnosis not present

## 2023-06-26 DIAGNOSIS — K2100 Gastro-esophageal reflux disease with esophagitis, without bleeding: Secondary | ICD-10-CM | POA: Diagnosis not present

## 2023-06-26 DIAGNOSIS — E611 Iron deficiency: Secondary | ICD-10-CM | POA: Diagnosis not present

## 2023-06-27 DIAGNOSIS — M25531 Pain in right wrist: Secondary | ICD-10-CM | POA: Diagnosis not present

## 2023-06-27 DIAGNOSIS — M25532 Pain in left wrist: Secondary | ICD-10-CM | POA: Diagnosis not present

## 2023-06-29 DIAGNOSIS — F4310 Post-traumatic stress disorder, unspecified: Secondary | ICD-10-CM | POA: Diagnosis not present

## 2023-06-29 DIAGNOSIS — F321 Major depressive disorder, single episode, moderate: Secondary | ICD-10-CM | POA: Diagnosis not present

## 2023-06-29 DIAGNOSIS — F411 Generalized anxiety disorder: Secondary | ICD-10-CM | POA: Diagnosis not present

## 2023-06-29 DIAGNOSIS — F1010 Alcohol abuse, uncomplicated: Secondary | ICD-10-CM | POA: Diagnosis not present

## 2023-06-30 DIAGNOSIS — S66309A Unspecified injury of extensor muscle, fascia and tendon of unspecified finger at wrist and hand level, initial encounter: Secondary | ICD-10-CM | POA: Diagnosis not present

## 2023-06-30 DIAGNOSIS — M25532 Pain in left wrist: Secondary | ICD-10-CM | POA: Diagnosis not present

## 2023-07-05 DIAGNOSIS — H18832 Recurrent erosion of cornea, left eye: Secondary | ICD-10-CM | POA: Diagnosis not present

## 2023-07-06 DIAGNOSIS — F1010 Alcohol abuse, uncomplicated: Secondary | ICD-10-CM | POA: Diagnosis not present

## 2023-07-06 DIAGNOSIS — F4310 Post-traumatic stress disorder, unspecified: Secondary | ICD-10-CM | POA: Diagnosis not present

## 2023-07-06 DIAGNOSIS — F411 Generalized anxiety disorder: Secondary | ICD-10-CM | POA: Diagnosis not present

## 2023-07-06 DIAGNOSIS — F321 Major depressive disorder, single episode, moderate: Secondary | ICD-10-CM | POA: Diagnosis not present

## 2023-07-07 DIAGNOSIS — Z0001 Encounter for general adult medical examination with abnormal findings: Secondary | ICD-10-CM | POA: Diagnosis not present

## 2023-07-07 DIAGNOSIS — Z9229 Personal history of other drug therapy: Secondary | ICD-10-CM | POA: Diagnosis not present

## 2023-07-07 DIAGNOSIS — K2100 Gastro-esophageal reflux disease with esophagitis, without bleeding: Secondary | ICD-10-CM | POA: Diagnosis not present

## 2023-07-13 DIAGNOSIS — Z0001 Encounter for general adult medical examination with abnormal findings: Secondary | ICD-10-CM | POA: Diagnosis not present

## 2023-07-13 DIAGNOSIS — Z1331 Encounter for screening for depression: Secondary | ICD-10-CM | POA: Diagnosis not present

## 2023-07-13 DIAGNOSIS — F419 Anxiety disorder, unspecified: Secondary | ICD-10-CM | POA: Diagnosis not present

## 2023-07-13 DIAGNOSIS — Z6825 Body mass index (BMI) 25.0-25.9, adult: Secondary | ICD-10-CM | POA: Diagnosis not present

## 2023-07-13 DIAGNOSIS — K2100 Gastro-esophageal reflux disease with esophagitis, without bleeding: Secondary | ICD-10-CM | POA: Diagnosis not present

## 2023-07-13 DIAGNOSIS — E663 Overweight: Secondary | ICD-10-CM | POA: Diagnosis not present

## 2023-07-13 DIAGNOSIS — F988 Other specified behavioral and emotional disorders with onset usually occurring in childhood and adolescence: Secondary | ICD-10-CM | POA: Diagnosis not present

## 2023-07-13 DIAGNOSIS — Z9229 Personal history of other drug therapy: Secondary | ICD-10-CM | POA: Diagnosis not present

## 2023-07-13 DIAGNOSIS — M5416 Radiculopathy, lumbar region: Secondary | ICD-10-CM | POA: Diagnosis not present

## 2023-07-18 ENCOUNTER — Encounter: Payer: Self-pay | Admitting: Nurse Practitioner

## 2023-07-18 ENCOUNTER — Ambulatory Visit: Payer: BC Managed Care – PPO | Admitting: Nurse Practitioner

## 2023-07-18 VITALS — BP 124/70 | HR 89 | Wt 163.0 lb

## 2023-07-18 DIAGNOSIS — N939 Abnormal uterine and vaginal bleeding, unspecified: Secondary | ICD-10-CM

## 2023-07-18 MED ORDER — MEDROXYPROGESTERONE ACETATE 10 MG PO TABS: 10.0000 mg | ORAL_TABLET | Freq: Every day | ORAL | 0 refills | Status: DC

## 2023-07-18 NOTE — Progress Notes (Signed)
   Acute Office Visit  Subjective:    Patient ID: Kimberly [name], female    DOB: 1986-08-01, 37 y.o.   MRN: [id number]   HPI 37 y.o. presents today for vaginal bleeding. Started Semaglutide injections 2 weeks ago and reports bleeding started soon after that. Bleeding is light to moderate. Seen in February 2024 by Dr. Edward Jolly for similar compliant. Ultrasound recommended for painful intercourse but not completed by patient because pain got better. Since course of Provera in February cycles having been regular until this episode. Denies menopausal symptoms. Mother menopausal at age 75. Denies vaginal symptoms. BTL.  Patient's last menstrual period was 07/10/2023.    Review of Systems  Constitutional: Negative.   Genitourinary:  Positive for vaginal bleeding. Negative for vaginal discharge and vaginal pain.       Objective:    Physical Exam Constitutional:      Appearance: Normal appearance.  Genitourinary:    General: Normal vulva.     Vagina: Normal.     Cervix: Normal.     Uterus: Normal.      Adnexa: Right adnexa normal and left adnexa normal.     BP 124/70   Pulse 89   Wt 163 lb (73.9 kg)   LMP 07/10/2023   SpO2 100%   BMI 25.53 kg/m  Wt Readings from Last 3 Encounters:  07/18/23 163 lb (73.9 kg)  12/12/22 160 lb (72.6 kg)  04/06/22 160 lb (72.6 kg)        Patient informed chaperone available to be present for breast and/or pelvic exam. Patient has requested no chaperone to be present. Patient has been advised what will be completed during breast and pelvic exam.   Assessment & Plan:   Problem List Items Addressed This Visit   None Visit Diagnoses     Vaginal bleeding    -  Primary   Relevant Medications   medroxyPROGESTERone (PROVERA) 10 MG tablet      Plan: Provera 10 mg x 10 days. If irregularity continues she will reach out.      Olivia Mackie DNP, 4:30 PM 07/18/2023

## 2023-07-27 ENCOUNTER — Encounter: Payer: Self-pay | Admitting: Nurse Practitioner

## 2023-07-27 DIAGNOSIS — N921 Excessive and frequent menstruation with irregular cycle: Secondary | ICD-10-CM

## 2023-07-28 ENCOUNTER — Other Ambulatory Visit: Payer: BC Managed Care – PPO

## 2023-07-28 DIAGNOSIS — N921 Excessive and frequent menstruation with irregular cycle: Secondary | ICD-10-CM | POA: Diagnosis not present

## 2023-07-28 LAB — CBC
HCT: 40.6 % (ref 35.0–45.0)
Hemoglobin: 13.1 g/dL (ref 11.7–15.5)
MCH: 27.2 pg (ref 27.0–33.0)
MCHC: 32.3 g/dL (ref 32.0–36.0)
MCV: 84.2 fL (ref 80.0–100.0)
MPV: 12 fL (ref 7.5–12.5)
Platelets: 208 10*3/uL (ref 140–400)
RBC: 4.82 10*6/uL (ref 3.80–5.10)
RDW: 13.9 % (ref 11.0–15.0)
WBC: 6.7 10*3/uL (ref 3.8–10.8)

## 2023-07-28 MED ORDER — MEDROXYPROGESTERONE ACETATE 10 MG PO TABS: ORAL_TABLET | ORAL | 2 refills | Status: DC

## 2023-07-28 NOTE — Telephone Encounter (Signed)
Review OV notes from 07/18/2023

## 2023-07-28 NOTE — Telephone Encounter (Signed)
Rx pending for provera.

## 2023-07-28 NOTE — Telephone Encounter (Signed)
Rx pend. Please adjust quantity and refills if desired. Thanks.

## 2023-07-28 NOTE — Telephone Encounter (Signed)
Another msg sent by same pt. Review OV notes from 07/18/2023.

## 2023-07-28 NOTE — Telephone Encounter (Signed)
Pt notified of recommendations via other mychart msg dated the same day. Will close this one.

## 2023-07-31 DIAGNOSIS — M65841 Other synovitis and tenosynovitis, right hand: Secondary | ICD-10-CM | POA: Diagnosis not present

## 2023-07-31 DIAGNOSIS — M654 Radial styloid tenosynovitis [de Quervain]: Secondary | ICD-10-CM | POA: Diagnosis not present

## 2023-07-31 DIAGNOSIS — S66309A Unspecified injury of extensor muscle, fascia and tendon of unspecified finger at wrist and hand level, initial encounter: Secondary | ICD-10-CM | POA: Diagnosis not present

## 2023-07-31 DIAGNOSIS — M25531 Pain in right wrist: Secondary | ICD-10-CM | POA: Diagnosis not present

## 2023-07-31 DIAGNOSIS — Z6824 Body mass index (BMI) 24.0-24.9, adult: Secondary | ICD-10-CM | POA: Diagnosis not present

## 2023-08-01 DIAGNOSIS — F321 Major depressive disorder, single episode, moderate: Secondary | ICD-10-CM | POA: Diagnosis not present

## 2023-08-01 DIAGNOSIS — F4310 Post-traumatic stress disorder, unspecified: Secondary | ICD-10-CM | POA: Diagnosis not present

## 2023-08-01 DIAGNOSIS — F411 Generalized anxiety disorder: Secondary | ICD-10-CM | POA: Diagnosis not present

## 2023-08-01 DIAGNOSIS — F1010 Alcohol abuse, uncomplicated: Secondary | ICD-10-CM | POA: Diagnosis not present

## 2023-08-04 ENCOUNTER — Ambulatory Visit
Admission: RE | Admit: 2023-08-04 | Discharge: 2023-08-04 | Disposition: A | Discharge status: Home / Self Care | Payer: BC Managed Care – PPO | Source: Ambulatory Visit | Attending: Obstetrics and Gynecology | Admitting: Obstetrics and Gynecology

## 2023-08-04 DIAGNOSIS — N939 Abnormal uterine and vaginal bleeding, unspecified: Secondary | ICD-10-CM | POA: Diagnosis not present

## 2023-08-04 DIAGNOSIS — N921 Excessive and frequent menstruation with irregular cycle: Secondary | ICD-10-CM

## 2023-08-14 DIAGNOSIS — F411 Generalized anxiety disorder: Secondary | ICD-10-CM | POA: Diagnosis not present

## 2023-08-14 DIAGNOSIS — F321 Major depressive disorder, single episode, moderate: Secondary | ICD-10-CM | POA: Diagnosis not present

## 2023-08-14 DIAGNOSIS — F4310 Post-traumatic stress disorder, unspecified: Secondary | ICD-10-CM | POA: Diagnosis not present

## 2023-08-14 DIAGNOSIS — F1010 Alcohol abuse, uncomplicated: Secondary | ICD-10-CM | POA: Diagnosis not present

## 2023-08-17 ENCOUNTER — Other Ambulatory Visit (HOSPITAL_COMMUNITY)
Admission: RE | Admit: 2023-08-17 | Discharge: 2023-08-17 | Disposition: A | Discharge status: Home / Self Care | Payer: BC Managed Care – PPO | Source: Ambulatory Visit | Attending: Obstetrics and Gynecology | Admitting: Obstetrics and Gynecology

## 2023-08-17 ENCOUNTER — Ambulatory Visit (INDEPENDENT_AMBULATORY_CARE_PROVIDER_SITE_OTHER): Payer: BC Managed Care – PPO | Admitting: Obstetrics and Gynecology

## 2023-08-17 ENCOUNTER — Encounter: Payer: Self-pay | Admitting: Obstetrics and Gynecology

## 2023-08-17 VITALS — BP 108/64 | HR 89 | Wt 156.0 lb

## 2023-08-17 DIAGNOSIS — N760 Acute vaginitis: Secondary | ICD-10-CM | POA: Diagnosis not present

## 2023-08-17 DIAGNOSIS — N920 Excessive and frequent menstruation with regular cycle: Secondary | ICD-10-CM | POA: Insufficient documentation

## 2023-08-17 DIAGNOSIS — N938 Other specified abnormal uterine and vaginal bleeding: Secondary | ICD-10-CM | POA: Diagnosis present

## 2023-08-17 DIAGNOSIS — N921 Excessive and frequent menstruation with irregular cycle: Secondary | ICD-10-CM

## 2023-08-17 DIAGNOSIS — N8003 Adenomyosis of the uterus: Secondary | ICD-10-CM | POA: Insufficient documentation

## 2023-08-17 DIAGNOSIS — B3731 Acute candidiasis of vulva and vagina: Secondary | ICD-10-CM | POA: Insufficient documentation

## 2023-08-17 DIAGNOSIS — B9689 Other specified bacterial agents as the cause of diseases classified elsewhere: Secondary | ICD-10-CM | POA: Diagnosis not present

## 2023-08-17 DIAGNOSIS — N939 Abnormal uterine and vaginal bleeding, unspecified: Secondary | ICD-10-CM | POA: Diagnosis not present

## 2023-08-17 MED ORDER — NORETHINDRONE ACETATE 5 MG PO TABS: 5.0000 mg | ORAL_TABLET | Freq: Every day | ORAL | 1 refills | Status: DC

## 2023-08-17 NOTE — Progress Notes (Signed)
GYNECOLOGY  VISIT   HPI: 37 y.o.   Married  Caucasian  female   [id number] with Patient's last menstrual period was 07/10/2023. Per Wyline Beady, NP note: here for   continued bleeding. Started December 18th, stopped for a week and then spotted for a few days. Started again Jan 9th and Jan 24th with a full period for both.   Last normal period was in November 22, December 18, January 9, January 19 spotting and January 24 - heavy flow.  Last day of bleeding was 12/07/22.  Having some pain, cramping, and pressure. Sex is also painful.   Feels like her cervix was painful with sex.   No partner change.   Usually menses every 27 - 29 days.   Mother had menopause at age 76.    Has a tubal ligation.  Sent here today for EMB and surgical consult. Tried provera and bled heavier.  Increased the dose of provera and lightened but still bleeding.  Today stopped.  Reports mood swings with the provera and would like to try aygestin. She is frustrated with the bleeding and pain and would like a definitive management with the [facility] Last pap smear in 2020  GYNECOLOGIC HISTORY: Patient's last menstrual period was 07/10/2023. Contraception:  BTL Menopausal hormone therapy:  n/a Last mammogram:  03/03/22 Breast Density Category C, BI-RADS CATEGORY 2 Benign Last pap smear:   07/03/19 normal, neg HR HPV.         OB History     Gravida  1   Para  1   Term  0   Preterm  1   AB  0   Living  2      SAB  0   IAB  0   Ectopic  0   Multiple  1   Live Births  2              Patient Active Problem List   Diagnosis Date Noted   Alcohol use disorder, severe, in early remission, dependence (HCC) 10/04/2022   Rapidly progressive ascending paresthesias 12/03/2021   Hypomagnesemia 12/03/2021   Folic acid deficiency 12/03/2021   Polyneuropathy 12/02/2021   History of Guillain-Barre syndrome January 2020 12/02/2021   Stress fracture of metatarsal bone of left foot 06/17/2020   Pain in  right knee 12/23/2019   Contusion of right knee 12/23/2019   Pain of joint of left ankle and foot 05/02/2019   Vitamin B1 deficiency 02/12/2019   Polysubstance abuse (HCC) 02/12/2019   Neuropathy 12/14/2018   Guillain Barr syndrome (HCC) 12/14/2018   History of dry beriberi January 2020 12/06/2018   Tobacco abuse 12/06/2018   Hyperglycemia 12/06/2018   Metabolic acidosis 12/06/2018   IBS (irritable bowel syndrome) 12/06/2018   Constipation 12/06/2018   GERD (gastroesophageal reflux disease) 12/06/2018   AIDP (acute inflammatory demyelinating polyneuropathy) (HCC) 11/19/2018   Weakness of both lower extremities 11/16/2018   Hypokalemia/hypomagnesemia 11/16/2018   Anxiety/depression 11/16/2018   Acute pharyngitis 10/01/2013   Depo-Provera contraceptive status 05/17/2013   Postpartum depression 10/09/2012   Lactation suppression 10/09/2012   Tachycardia 03/09/2012   HSV-1 infection     Past Medical History:  Diagnosis Date   Abnormal Pap smear    ADD (attention deficit disorder)    Anxiety    Guillain Barr syndrome (HCC)    Headache(784.0)    HSV-1 infection    IBS (irritable bowel syndrome)    Knee hyperextension injury 2020   Left, being treated by ortho   Tachycardia  Past Surgical History:  Procedure Laterality Date   BREAST BIOPSY Right    BREAST SURGERY     Biopsy-benign   CESAREAN SECTION  09/13/2012   Procedure: CESAREAN SECTION;  Surgeon: Willodean Rosenthal, MD;  Location: [facility];  Service: Obstetrics;  Laterality: N/A;   cortisone shot R wrist Right 2020   KYPHOPLASTY N/A 08/27/2013   Procedure: Thoracic Twelve Kyphoplasty;  Surgeon: Maeola Harman, MD;  Location: [facility];  Service: Neurosurgery;  Laterality: N/A;  T12 Kyphoplasty   LAPAROSCOPIC TUBAL LIGATION Bilateral 10/21/2019   Procedure: LAPAROSCOPIC TUBAL LIGATION WITH CAUTERY;  Surgeon: Genia Del, MD;  Location: [facility];  Service: Gynecology;  Laterality:  Bilateral;  Request to follow 2nd case in [place] [facility]yn block requests one hour   WISDOM TOOTH EXTRACTION     x4   WRIST SURGERY Right 07-23-2013    Current Outpatient Medications  Medication Sig Dispense Refill   ALPRAZolam (XANAX) 0.5 MG tablet Take 0.5 mg by mouth 4 (four) times daily as needed.     medroxyPROGESTERone (PROVERA) 10 MG tablet Take two tabs po daily. 60 tablet 2   pantoprazole (PROTONIX) 40 MG tablet Take 1 tablet (40 mg total) by mouth daily. 30 tablet 1   pregabalin (LYRICA) 200 MG capsule TAKE 1 CAPSULE (200 MG TOTAL) BY MOUTH 3 (THREE) TIMES DAILY. 90 capsule 2   Semaglutide,0.25 or 0.5MG /DOS, 2 MG/1.5ML SOPN INJECT 10 UNITS (0.25 MG) UNDER THE SKIN ONCE A WEEK FOR 4 WEEKS     traZODone (DESYREL) 50 MG tablet TAKE 1 TABLET BY MOUTH EVERY DAY AT BEDTIME AS NEEDED FOR SLEEP 90 tablet 0   No current facility-administered medications for this visit.     ALLERGIES: Wellbutrin [bupropion] and Tape  Family History  Problem Relation Age of Onset   Breast cancer Mother    Cancer Mother        Pancreatic cancer   Depression Mother        chronic   Hearing loss Mother    Hyperlipidemia Mother    Vision loss Mother        beachets disease   COPD Father    Diabetes Father    Cancer Maternal Grandmother 2       breast cancer   Parkinsonism Maternal Grandmother    Heart disease Maternal Grandmother    Breast cancer Maternal Grandmother    Heart disease Maternal Grandfather    Alzheimer's disease Paternal Grandmother    Heart disease Paternal Grandfather        Died age 37   Nephrolithiasis Paternal Grandfather    Cancer Other 50       breast   Breast cancer Other     Social History   Socioeconomic History   Marital status: Married    Spouse name: Not on file   Number of children: 2   Years of education: Not on file   Highest education level: Not on file  Occupational History   Occupation: In home health   Tobacco Use   Smoking status: Every Day     Current packs/day: 0.00    Average packs/day: 0.5 packs/day for 10.0 years (5.0 ttl pk-yrs)    Types: Cigarettes    Start date: 01/30/2002    Last attempt to quit: 01/31/2012    Years since quitting: 11.5   Smokeless tobacco: Never   Tobacco comments:    pt has decreased her use to 1-2 cigarettes per day   Vaping Use  Vaping status: Never Used  Substance and Sexual Activity   Alcohol use: Not Currently   Drug use: Never   Sexual activity: Yes    Partners: Male    Birth control/protection: Surgical    Comment: 1st intercourse 37 yo-More than 5 partners-BTL  Other Topics Concern   Not on file  Social History Narrative   Lives with husband and twin sons    Social Determinants of Health   Financial Resource Strain: Not on file  Food Insecurity: Not on file  Transportation Needs: Not on file  Physical Activity: Not on file  Stress: Not on file  Social Connections: Unknown (11/10/2022)   Received from [facility]   Social Network    Social Network: Not on file  Intimate Partner Violence: Unknown (11/10/2022)   Received from [facility]   HITS    Physically Hurt: Not on file    Insult or Talk Down To: Not on file    Threaten Physical Harm: Not on file    Scream or Curse: Not on file    Review of Systems  All other systems reviewed and are negative.   PHYSICAL EXAMINATION:    BP 108/64   Pulse 89   Wt 156 lb (70.8 kg)   LMP 07/10/2023 Comment: bled for over  SpO2 99%   BMI 24.43 kg/m     General appearance: alert, cooperative and appears stated age   Pelvic: External genitalia:  no lesions              Urethra:  normal appearing urethra with no masses, tenderness or lesions              Bartholins and Skenes: normal                 Vagina: normal appearing vagina with normal color and discharge, no lesions              Cervix: no lesions.  No CMT.  Vault with no blood                  Uterus:  normal size, contour, position,  consistency, mobility, non-tender              PROCEDURE: EMB Consent obtained for the procedure.  A bivalve speculum was placed in the vagina.  The cervix was grasped with a single tooth tenaculum. Uterus sound to 8cm.  Pipelle was inserted and rotated.  Adequate specimen was obtained and sent to pathology.  All instruments were removed.  Patient tolerated the procedure well.  To notify patient of the results.   ASSESSMENT  Menorrhagia with irregular menses.  Dyspareunia.  FH breast and pancreatic cancer. DUB Dysmenorrhea H/o LTCS x1 Likely adenomyosis  PLAN Symptomatic fibroid uterus:  Counseled on all options.  She would like to have the [facility].  Counseled extensively on the procedure including but not limited to what to expect and risks and benefits.  Counseled on postop care and pelvic rest for 10 weeks after the surgery with restricted lifting for 6 weeks after.  Counseled on the benefits of the robotic procedure with faster return to daily activities, improved outcomes, and less risk for complications. She would like to have this scheduled. [name] pending final results EMB: to notify patient of the results Aygestin sent.  Discussed she may switch to this instead of provera.  She can also increase to 10mg  daily, if heavy bleeding until surgery. Dr. Karma Greaser

## 2023-08-18 LAB — SURESWAB® ADVANCED VAGINITIS PLUS,TMA
C. trachomatis RNA, TMA: NOT DETECTED
CANDIDA SPECIES: DETECTED — AB
Candida glabrata: NOT DETECTED
N. gonorrhoeae RNA, TMA: NOT DETECTED
SURESWAB(R) ADV BACTERIAL VAGINOSIS(BV),TMA: POSITIVE — AB
TRICHOMONAS VAGINALIS (TV),TMA: NOT DETECTED

## 2023-08-21 LAB — CYTOLOGY - PAP
Comment: NEGATIVE
Diagnosis: NEGATIVE
Diagnosis: REACTIVE
High risk HPV: NEGATIVE

## 2023-08-21 LAB — SURGICAL PATHOLOGY

## 2023-08-22 ENCOUNTER — Other Ambulatory Visit: Payer: Self-pay | Admitting: *Deleted

## 2023-08-22 MED ORDER — FLUCONAZOLE 150 MG PO TABS: ORAL_TABLET | ORAL | 0 refills | Status: DC

## 2023-08-22 MED ORDER — METRONIDAZOLE 500 MG PO TABS: 500.0000 mg | ORAL_TABLET | Freq: Two times a day (BID) | ORAL | 0 refills | Status: AC

## 2023-08-22 NOTE — Telephone Encounter (Signed)
Scheduling for 09/01/23.   Only 3 tablets sent of Aygestin, do you want to increase to 30?

## 2023-08-22 NOTE — Telephone Encounter (Signed)
Dr. Karma Greaser -please review Aygestin Rx sent on 08/17/23. 3 tabs sent with 1 refill.

## 2023-08-22 NOTE — Telephone Encounter (Addendum)
Left message to call [facility] Triage at [phone], option 4.   See results and surgery referral.

## 2023-08-23 ENCOUNTER — Encounter: Payer: Self-pay | Admitting: *Deleted

## 2023-08-23 MED ORDER — NORETHINDRONE ACETATE 5 MG PO TABS: 5.0000 mg | ORAL_TABLET | Freq: Every day | ORAL | 1 refills | Status: DC

## 2023-08-23 NOTE — Telephone Encounter (Signed)
New Rx sent for Aygestin.

## 2023-08-23 NOTE — Addendum Note (Signed)
Addended by: Leda Min on: 08/23/2023 02:24 PM   Modules accepted: Orders

## 2023-08-24 ENCOUNTER — Other Ambulatory Visit: Payer: Self-pay

## 2023-08-24 ENCOUNTER — Encounter (HOSPITAL_BASED_OUTPATIENT_CLINIC_OR_DEPARTMENT_OTHER): Payer: Self-pay | Admitting: Obstetrics and Gynecology

## 2023-08-24 DIAGNOSIS — Z01818 Encounter for other preprocedural examination: Secondary | ICD-10-CM | POA: Diagnosis not present

## 2023-08-24 NOTE — Progress Notes (Signed)
Your procedure is scheduled on Friday, 09/01/23.  Report to [facility] AT 6:30 AM.   Call this number if you have problems the morning of surgery  [phone].   OUR ADDRESS IS [address].  WE ARE LOCATED [facility].  PLEASE BRING YOUR INSURANCE CARD AND PHOTO ID DAY OF SURGERY.  ONLY 2 PEOPLE ARE ALLOWED IN  WAITING  ROOM                                      REMEMBER:  DO NOT EAT FOOD, CANDY GUM OR MINTS  AFTER MIDNIGHT THE NIGHT BEFORE YOUR SURGERY . YOU MAY HAVE CLEAR LIQUIDS FROM MIDNIGHT THE NIGHT BEFORE YOUR SURGERY UNTIL  5:30 AM. NO CLEAR LIQUIDS AFTER   5:30 AM DAY OF SURGERY.  YOU MAY  BRUSH YOUR TEETH MORNING OF SURGERY AND RINSE YOUR MOUTH OUT, NO CHEWING GUM CANDY OR MINTS.     CLEAR LIQUID DIET    Allowed      Water                                                                   Coffee and tea, regular and decaf  (NO cream or milk products of any type, may sweeten)                         Carbonated beverages, regular and diet                                    Sports drinks like Gatorade _____________________________________________________________________     TAKE ONLY THESE MEDICATIONS MORNING OF SURGERY: Xanax if needed, norethindrone, Protonix, Lyrica, Flagyl  Last dose of Semaglutide before surgery should be on 08/19/2023.                                        DO NOT WEAR JEWERLY/  METAL/  PIERCINGS (INCLUDING NO PLASTIC PIERCINGS) DO NOT WEAR LOTIONS, POWDERS, PERFUMES OR NAIL POLISH ON YOUR FINGERNAILS. TOENAIL POLISH IS OK TO WEAR. DO NOT SHAVE FOR 48 HOURS PRIOR TO DAY OF SURGERY.  CONTACTS, GLASSES, OR DENTURES MAY NOT BE WORN TO SURGERY.  REMEMBER: NO SMOKING, VAPING ,  DRUGS OR ALCOHOL FOR 24 HOURS BEFORE YOUR SURGERY.                                    [facility] IS NOT RESPONSIBLE  FOR ANY BELONGINGS.                                                                    Marland Kitchen  [facility] - Preparing for Surgery Before surgery, you can play an important role.  Because skin is not sterile, your skin needs to be as free of germs as possible.  You can reduce the number of germs on your skin by washing with CHG (chlorahexidine gluconate) soap before surgery.  CHG is an antiseptic cleaner which kills germs and bonds with the skin to continue killing germs even after washing. Please DO NOT use if you have an allergy to CHG or antibacterial soaps.  If your skin becomes reddened/irritated stop using the CHG and inform your nurse when you arrive at Short Stay. Do not shave (including legs and underarms) for at least 48 hours prior to the first CHG shower.  You may shave your face/neck. Please follow these instructions carefully:  1.  Shower with CHG Soap the night before surgery and the  morning of Surgery.  2.  If you choose to wash your hair, wash your hair first as usual with your  normal  shampoo.  3.  After you shampoo, rinse your hair and body thoroughly to remove the  shampoo.                                        4.  Use CHG as you would any other liquid soap.  You can apply chg directly  to the skin and wash , chg soap provided, night before and morning of your surgery.  5.  Apply the CHG Soap to your body ONLY FROM THE NECK DOWN.   Do not use on face/ open                           Wound or open sores. Avoid contact with eyes, ears mouth and genitals (private parts).                       Wash face,  Genitals (private parts) with your normal soap.             6.  Wash thoroughly, paying special attention to the area where your surgery  will be performed.  7.  Thoroughly rinse your body with warm water from the neck down.  8.  DO NOT shower/wash with your normal soap after using and rinsing off  the CHG Soap.             9.  Pat yourself dry with a clean towel.            10.  Wear clean pajamas.            11.  Place clean sheets on your bed the night of your first  shower and do not  sleep with pets. Day of Surgery : Do not apply any lotions/ powders the morning of surgery.  Please wear clean clothes to the hospital/surgery center.  IF YOU HAVE ANY SKIN IRRITATION OR PROBLEMS WITH THE SURGICAL SOAP, PLEASE GET A BAR OF GOLD DIAL SOAP AND SHOWER THE NIGHT BEFORE YOUR SURGERY AND THE MORNING OF YOUR SURGERY. PLEASE LET THE NURSE KNOW MORNING OF YOUR SURGERY IF YOU HAD ANY PROBLEMS WITH THE SURGICAL SOAP.   YOUR SURGEON MAY HAVE REQUESTED EXTENDED RECOVERY TIME AFTER YOUR SURGERY. IT COULD BE A  JUST A FEW HOURS  UP TO AN OVERNIGHT STAY.  YOUR SURGEON SHOULD HAVE DISCUSSED  THIS WITH YOU PRIOR TO YOUR SURGERY. IN THE EVENT YOU NEED TO STAY OVERNIGHT PLEASE REFER TO THE FOLLOWING GUIDELINES. YOU MAY HAVE UP TO 4 VISITORS  MAY VISIT IN THE EXTENDED RECOVERY ROOM UNTIL 800 PM ONLY.  ONE  VISITOR AGE 44 AND OVER MAY SPEND THE NIGHT AND MUST BE IN EXTENDED RECOVERY ROOM NO LATER THAN 800 PM . YOUR DISCHARGE TIME AFTER YOU SPEND THE NIGHT IS 900 AM THE MORNING AFTER YOUR SURGERY. YOU MAY PACK A SMALL OVERNIGHT BAG WITH TOILETRIES FOR YOUR OVERNIGHT STAY IF YOU WISH.  REGARDLESS OF IF YOU STAY OVER NIGHT OR ARE DISCHARGED THE SAME DAY YOU WILL BE REQUIRED TO HAVE A RESPONSIBLE ADULT (18 YRS OLD OR OLDER) STAY WITH YOU FOR AT LEAST THE FIRST 24 HOURS  YOUR PRESCRIPTION MEDICATIONS WILL BE PROVIDED DURING YOUR HOSPITAL STAY.  ________________________________________________________________________                                                        QUESTIONS Kimberly [name] PRE OP NURSE PHONE [phone].

## 2023-08-24 NOTE — Progress Notes (Signed)
Spoke w/ via phone for pre-op interview---[facility] needs dos---- UPT per anesthesia, surgeon orders pending        Lab results------10/22 lab appt for cbc, type & screen, 08/17/22 Ct angio chest and chest xray in Epic, 10/02/2018 normal echocardiogram in Epic COVID test -----patient states asymptomatic no test needed Arrive at -------0630 on Friday, 09/01/2023 NPO after MN NO Solid Food.  Clear liquids from MN until---0530 Med rec completed Medications to take morning of surgery -----Xanax prn, norethindrone, Protonix, Lyrica, Flagyl Diabetic medication -----Hold Semaglutide x 7 days. Last dose before surgery was on 08/19/2023. Patient instructed no nail polish to be worn day of surgery Patient instructed to bring photo id and insurance card day of surgery Patient aware to have Driver (ride ) / caregiver    for 24 hours after surgery - husband, Thereasa Distance Patient Special Instructions -----Extended / overnight stay instructions given. Pre-Op special Instructions -----Requested orders from Dr. Karma Greaser via Epic IB on 08/22/23. Patient verbalized understanding of instructions that were given at this phone interview. Patient denies chest pain, sob, fever, cough at the interview.

## 2023-08-28 DIAGNOSIS — M654 Radial styloid tenosynovitis [de Quervain]: Secondary | ICD-10-CM | POA: Diagnosis not present

## 2023-08-29 ENCOUNTER — Encounter (HOSPITAL_COMMUNITY)
Admission: RE | Admit: 2023-08-29 | Discharge: 2023-08-29 | Disposition: A | Discharge status: Home / Self Care | Payer: BC Managed Care – PPO | Source: Ambulatory Visit | Attending: Obstetrics and Gynecology | Admitting: Obstetrics and Gynecology

## 2023-08-29 DIAGNOSIS — Z01818 Encounter for other preprocedural examination: Secondary | ICD-10-CM | POA: Diagnosis not present

## 2023-08-29 LAB — CBC
HCT: 42.2 % (ref 36.0–46.0)
Hemoglobin: 13.6 g/dL (ref 12.0–15.0)
MCH: 27.8 pg (ref 26.0–34.0)
MCHC: 32.2 g/dL (ref 30.0–36.0)
MCV: 86.3 fL (ref 80.0–100.0)
Platelets: 235 10*3/uL (ref 150–400)
RBC: 4.89 MIL/uL (ref 3.87–5.11)
RDW: 14.6 % (ref 11.5–15.5)
WBC: 9.5 10*3/uL (ref 4.0–10.5)
nRBC: 0 % (ref 0.0–0.2)

## 2023-08-31 ENCOUNTER — Telehealth: Payer: Self-pay | Admitting: Obstetrics and Gynecology

## 2023-08-31 MED ORDER — METOCLOPRAMIDE HCL 10 MG PO TABS: 10.0000 mg | ORAL_TABLET | Freq: Three times a day (TID) | ORAL | 0 refills | Status: DC | PRN

## 2023-08-31 MED ORDER — OXYCODONE HCL 5 MG PO TABS
5.0000 mg | ORAL_TABLET | ORAL | 0 refills | Status: DC | PRN
Start: 2023-08-31 — End: 2023-09-04

## 2023-08-31 MED ORDER — IBUPROFEN 800 MG PO TABS: 800.0000 mg | ORAL_TABLET | Freq: Three times a day (TID) | ORAL | 1 refills | Status: AC | PRN

## 2023-08-31 NOTE — Telephone Encounter (Signed)
Surgery preop called. Postop medications sent and all questions answered. Dr. Karma Greaser

## 2023-08-31 NOTE — Plan of Care (Signed)
[facility] Tonsillectomy/Adenoidectomy, Postoperative PEDS care plan entered in error.

## 2023-08-31 NOTE — H&P (Signed)
Signed     Expand All Collapse All  GYNECOLOGY  VISIT PREOP H&P   HPI: 37 y.o.   Married  Caucasian  female   [id number] with Patient's last menstrual period was 07/10/2023. Per Wyline Beady, NP note: here for   continued bleeding. Started December 18th, stopped for a week and then spotted for a few days. Started again Jan 9th and Jan 24th with a full period for both.    Last normal period was in November 22, December 18, January 9, January 19 spotting and January 24 - heavy flow.  Last day of bleeding was 12/07/22.  Having some pain, cramping, and pressure. Sex is also painful.   Feels like her cervix was painful with sex.    No partner change.    Usually menses every 27 - 29 days.    Mother had menopause at age 40.      Has a tubal ligation.  Sent here today for EMB and surgical consult. Tried provera and bled heavier.  Increased the dose of provera and lightened but still bleeding.  Today stopped.  Reports mood swings with the provera and would like to try aygestin. She is frustrated with the bleeding and pain and would like a definitive management with the [facility] Last pap smear in 2020   GYNECOLOGIC HISTORY: Patient's last menstrual period was 07/10/2023. Contraception:  BTL Menopausal hormone therapy:  n/a Last mammogram:  03/03/22 Breast Density Category C, BI-RADS CATEGORY 2 Benign Last pap smear:   07/03/19 normal, neg HR HPV.         OB History       Gravida  1   Para  1   Term  0   Preterm  1   AB  0   Living  2        SAB  0   IAB  0   Ectopic  0   Multiple  1   Live Births  2                     Patient Active Problem List    Diagnosis Date Noted   Alcohol use disorder, severe, in early remission, dependence (HCC) 10/04/2022   Rapidly progressive ascending paresthesias 12/03/2021   Hypomagnesemia 12/03/2021   Folic acid deficiency 12/03/2021   Polyneuropathy 12/02/2021   History of Guillain-Barre syndrome January 2020 12/02/2021    Stress fracture of metatarsal bone of left foot 06/17/2020   Pain in right knee 12/23/2019   Contusion of right knee 12/23/2019   Pain of joint of left ankle and foot 05/02/2019   Vitamin B1 deficiency 02/12/2019   Polysubstance abuse (HCC) 02/12/2019   Neuropathy 12/14/2018   Guillain Barr syndrome (HCC) 12/14/2018   History of dry beriberi January 2020 12/06/2018   Tobacco abuse 12/06/2018   Hyperglycemia 12/06/2018   Metabolic acidosis 12/06/2018   IBS (irritable bowel syndrome) 12/06/2018   Constipation 12/06/2018   GERD (gastroesophageal reflux disease) 12/06/2018   AIDP (acute inflammatory demyelinating polyneuropathy) (HCC) 11/19/2018   Weakness of both lower extremities 11/16/2018   Hypokalemia/hypomagnesemia 11/16/2018   Anxiety/depression 11/16/2018   Acute pharyngitis 10/01/2013   Depo-Provera contraceptive status 05/17/2013   Postpartum depression 10/09/2012   Lactation suppression 10/09/2012   Tachycardia 03/09/2012   HSV-1 infection            Past Medical History:  Diagnosis Date   Abnormal Pap smear     ADD (attention deficit disorder)     Anxiety  Guillain Barr syndrome (HCC)     Headache(784.0)     HSV-1 infection     IBS (irritable bowel syndrome)     Knee hyperextension injury 2020    Left, being treated by ortho   Tachycardia                 Past Surgical History:  Procedure Laterality Date   BREAST BIOPSY Right     BREAST SURGERY        Biopsy-benign   CESAREAN SECTION   09/13/2012    Procedure: CESAREAN SECTION;  Surgeon: Willodean Rosenthal, MD;  Location: [facility];  Service: Obstetrics;  Laterality: N/A;   cortisone shot R wrist Right 2020   KYPHOPLASTY N/A 08/27/2013    Procedure: Thoracic Twelve Kyphoplasty;  Surgeon: Maeola Harman, MD;  Location: [facility];  Service: Neurosurgery;  Laterality: N/A;  T12 Kyphoplasty   LAPAROSCOPIC TUBAL LIGATION Bilateral 10/21/2019    Procedure: LAPAROSCOPIC TUBAL LIGATION WITH CAUTERY;   Surgeon: Genia Del, MD;  Location: [facility];  Service: Gynecology;  Laterality: Bilateral;  Request to follow 2nd case in [place] Gyn block requests one hour   WISDOM TOOTH EXTRACTION        x4   WRIST SURGERY Right 07-23-2013                Current Outpatient Medications  Medication Sig Dispense Refill   ALPRAZolam (XANAX) 0.5 MG tablet Take 0.5 mg by mouth 4 (four) times daily as needed.       medroxyPROGESTERone (PROVERA) 10 MG tablet Take two tabs po daily. 60 tablet 2   pantoprazole (PROTONIX) 40 MG tablet Take 1 tablet (40 mg total) by mouth daily. 30 tablet 1   pregabalin (LYRICA) 200 MG capsule TAKE 1 CAPSULE (200 MG TOTAL) BY MOUTH 3 (THREE) TIMES DAILY. 90 capsule 2   Semaglutide,0.25 or 0.5MG /DOS, 2 MG/1.5ML SOPN INJECT 10 UNITS (0.25 MG) UNDER THE SKIN ONCE A WEEK FOR 4 WEEKS       traZODone (DESYREL) 50 MG tablet TAKE 1 TABLET BY MOUTH EVERY DAY AT BEDTIME AS NEEDED FOR SLEEP 90 tablet 0      No current facility-administered medications for this visit.        ALLERGIES: Wellbutrin [bupropion] and Tape        Family History  Problem Relation Age of Onset   Breast cancer Mother     Cancer Mother          Pancreatic cancer   Depression Mother          chronic   Hearing loss Mother     Hyperlipidemia Mother     Vision loss Mother          beachets disease   COPD Father     Diabetes Father     Cancer Maternal Grandmother 70        breast cancer   Parkinsonism Maternal Grandmother     Heart disease Maternal Grandmother     Breast cancer Maternal Grandmother     Heart disease Maternal Grandfather     Alzheimer's disease Paternal Grandmother     Heart disease Paternal Grandfather          Died age 8   Nephrolithiasis Paternal Grandfather     Cancer Other 52        breast   Breast cancer Other            Social History  Socioeconomic History   Marital status: Married      Spouse name: Not on file   Number of  children: 2   Years of education: Not on file   Highest education level: Not on file  Occupational History   Occupation: In home health   Tobacco Use   Smoking status: Every Day      Current packs/day: 0.00      Average packs/day: 0.5 packs/day for 10.0 years (5.0 ttl pk-yrs)      Types: Cigarettes      Start date: 01/30/2002      Last attempt to quit: 01/31/2012      Years since quitting: 11.5   Smokeless tobacco: Never   Tobacco comments:      pt has decreased her use to 1-2 cigarettes per day   Vaping Use   Vaping status: Never Used  Substance and Sexual Activity   Alcohol use: Not Currently   Drug use: Never   Sexual activity: Yes      Partners: Male      Birth control/protection: Surgical      Comment: 1st intercourse 37 yo-More than 5 partners-BTL  Other Topics Concern   Not on file  Social History Narrative    Lives with husband and twin sons     Social Determinants of Health        Financial Resource Strain: Not on file  Food Insecurity: Not on file  Transportation Needs: Not on file  Physical Activity: Not on file  Stress: Not on file  Social Connections: Unknown (11/10/2022)    Received from [facility]    Social Network     Social Network: Not on file  Intimate Partner Violence: Unknown (11/10/2022)    Received from [facility], [facility]    HITS     Physically Hurt: Not on file     Insult or Talk Down To: Not on file     Threaten Physical Harm: Not on file     Scream or Curse: Not on file      Review of Systems  All other systems reviewed and are negative.     PHYSICAL EXAMINATION:     BP 108/64   Pulse 89   Wt 156 lb (70.8 kg)   LMP 07/10/2023 Comment: bled for over  SpO2 99%   BMI 24.43 kg/m     General appearance: alert, cooperative and appears stated age   Pelvic: External genitalia:  no lesions              Urethra:  normal appearing urethra with no masses, tenderness or lesions              Bartholins and  Skenes: normal                 Vagina: normal appearing vagina with normal color and discharge, no lesions              Cervix: no lesions.  No CMT.  Vault with no blood                  Uterus:  normal size, contour, position, consistency, mobility, non-tender               PROCEDURE: EMB Consent obtained for the procedure.  A bivalve speculum was placed in the vagina.  The cervix was grasped with a single tooth tenaculum. Uterus sound to 8cm.  Pipelle was inserted and rotated.  Adequate  specimen was obtained and sent to pathology.  All instruments were removed.  Patient tolerated the procedure well.  To notify patient of the results.     ASSESSMENT  Menorrhagia with irregular menses.  Dyspareunia.  FH breast and pancreatic cancer. DUB Dysmenorrhea H/o LTCS x1 Likely adenomyosis   PLAN  The risk, benefits, alternatives of the procedure were discussed with patient including but not limited to the risk for bleeding, infection, injury to surrounding structures such as the bowel, vessels, bladder, and ureters were reviewed.  The risk for blood products and risk for an additional procedure, and risk for laparotomy in an extreme event was discussed.  The risk for blood clots are also discussed.  Postoperative care instructions were discussed and reviewed with patient.  Postoperative medications were sent to her pharmacy and patient was instructed to pick up prior to surgery.  She will have a driver to take her there and take her home and she agrees that that person will stay with her overnight. Post care instructions were also put in the wrap-up section of this note for patient, as a reminder of care instructions. All questions were answered and patient agrees and would like to proceed with the procedure.  Dr. Karma Greaser

## 2023-09-01 ENCOUNTER — Other Ambulatory Visit: Payer: Self-pay

## 2023-09-01 ENCOUNTER — Encounter (HOSPITAL_BASED_OUTPATIENT_CLINIC_OR_DEPARTMENT_OTHER)
Admission: RE | Disposition: A | Discharge status: Home / Self Care | Payer: Self-pay | Source: Home / Self Care | Attending: Obstetrics and Gynecology

## 2023-09-01 ENCOUNTER — Encounter (HOSPITAL_BASED_OUTPATIENT_CLINIC_OR_DEPARTMENT_OTHER): Payer: Self-pay | Admitting: Obstetrics and Gynecology

## 2023-09-01 ENCOUNTER — Ambulatory Visit (HOSPITAL_BASED_OUTPATIENT_CLINIC_OR_DEPARTMENT_OTHER): Payer: BC Managed Care – PPO | Admitting: Anesthesiology

## 2023-09-01 ENCOUNTER — Ambulatory Visit (HOSPITAL_BASED_OUTPATIENT_CLINIC_OR_DEPARTMENT_OTHER)
Admission: RE | Admit: 2023-09-01 | Discharge: 2023-09-01 | Disposition: A | Discharge status: Home / Self Care | Payer: BC Managed Care – PPO | Attending: Obstetrics and Gynecology | Admitting: Obstetrics and Gynecology

## 2023-09-01 DIAGNOSIS — Z800 Family history of malignant neoplasm of digestive organs: Secondary | ICD-10-CM | POA: Diagnosis not present

## 2023-09-01 DIAGNOSIS — N921 Excessive and frequent menstruation with irregular cycle: Secondary | ICD-10-CM

## 2023-09-01 DIAGNOSIS — N938 Other specified abnormal uterine and vaginal bleeding: Secondary | ICD-10-CM | POA: Diagnosis not present

## 2023-09-01 DIAGNOSIS — Z803 Family history of malignant neoplasm of breast: Secondary | ICD-10-CM | POA: Diagnosis not present

## 2023-09-01 DIAGNOSIS — K589 Irritable bowel syndrome without diarrhea: Secondary | ICD-10-CM | POA: Insufficient documentation

## 2023-09-01 DIAGNOSIS — F17210 Nicotine dependence, cigarettes, uncomplicated: Secondary | ICD-10-CM | POA: Diagnosis not present

## 2023-09-01 DIAGNOSIS — N946 Dysmenorrhea, unspecified: Secondary | ICD-10-CM | POA: Diagnosis not present

## 2023-09-01 DIAGNOSIS — D259 Leiomyoma of uterus, unspecified: Secondary | ICD-10-CM | POA: Diagnosis not present

## 2023-09-01 DIAGNOSIS — R739 Hyperglycemia, unspecified: Secondary | ICD-10-CM

## 2023-09-01 DIAGNOSIS — D649 Anemia, unspecified: Secondary | ICD-10-CM | POA: Diagnosis not present

## 2023-09-01 DIAGNOSIS — N8003 Adenomyosis of the uterus: Secondary | ICD-10-CM

## 2023-09-01 DIAGNOSIS — N920 Excessive and frequent menstruation with regular cycle: Secondary | ICD-10-CM | POA: Diagnosis not present

## 2023-09-01 DIAGNOSIS — Z01818 Encounter for other preprocedural examination: Secondary | ICD-10-CM

## 2023-09-01 DIAGNOSIS — Z98891 History of uterine scar from previous surgery: Secondary | ICD-10-CM | POA: Insufficient documentation

## 2023-09-01 DIAGNOSIS — K219 Gastro-esophageal reflux disease without esophagitis: Secondary | ICD-10-CM | POA: Insufficient documentation

## 2023-09-01 HISTORY — PX: ROBOTIC ASSISTED LAPAROSCOPIC HYSTERECTOMY AND SALPINGECTOMY: SHX6379

## 2023-09-01 HISTORY — PX: CYSTOSCOPY: SHX5120

## 2023-09-01 HISTORY — DX: Alcohol use, unspecified, uncomplicated: F10.90

## 2023-09-01 HISTORY — DX: Alcoholic polyneuropathy: G62.1

## 2023-09-01 HISTORY — DX: Depression, unspecified: F32.A

## 2023-09-01 HISTORY — DX: Presence of spectacles and contact lenses: Z97.3

## 2023-09-01 HISTORY — DX: Gastro-esophageal reflux disease without esophagitis: K21.9

## 2023-09-01 LAB — POCT PREGNANCY, URINE: Preg Test, Ur: NEGATIVE

## 2023-09-01 LAB — TYPE AND SCREEN
ABO/RH(D): O NEG
Antibody Screen: NEGATIVE

## 2023-09-01 SURGERY — XI ROBOTIC ASSISTED LAPAROSCOPIC HYSTERECTOMY AND SALPINGECTOMY
Anesthesia: General | Site: Urethra

## 2023-09-01 MED ORDER — AMISULPRIDE (ANTIEMETIC) 5 MG/2ML IV SOLN: 10.0000 mg | Freq: Once | INTRAVENOUS | Status: DC | PRN

## 2023-09-01 MED ORDER — PANTOPRAZOLE SODIUM 40 MG IV SOLR: 40.0000 mg | Freq: Every day | INTRAVENOUS | Status: DC

## 2023-09-01 MED ORDER — IBUPROFEN 200 MG PO TABS: 600.0000 mg | ORAL_TABLET | Freq: Four times a day (QID) | ORAL | Status: DC

## 2023-09-01 MED ORDER — CEFOXITIN SODIUM 2 G IV SOLR
INTRAVENOUS | Status: AC
  Filled 2023-09-01: qty 2

## 2023-09-01 MED ORDER — FENTANYL CITRATE (PF) 100 MCG/2ML IJ SOLN
INTRAMUSCULAR | Status: AC
  Filled 2023-09-01: qty 2

## 2023-09-01 MED ORDER — PHENYLEPHRINE 80 MCG/ML (10ML) SYRINGE FOR IV PUSH (FOR BLOOD PRESSURE SUPPORT)
PREFILLED_SYRINGE | INTRAVENOUS | Status: DC | PRN
  Administered 2023-09-01: 160 ug via INTRAVENOUS

## 2023-09-01 MED ORDER — ACETAMINOPHEN 500 MG PO TABS
ORAL_TABLET | ORAL | Status: AC
  Filled 2023-09-01: qty 2

## 2023-09-01 MED ORDER — GABAPENTIN 300 MG PO CAPS
300.0000 mg | ORAL_CAPSULE | ORAL | Status: AC
  Administered 2023-09-01: 300 mg via ORAL

## 2023-09-01 MED ORDER — POVIDONE-IODINE 10 % EX SWAB
2.0000 | Freq: Once | CUTANEOUS | Status: AC
  Administered 2023-09-01: 2 via TOPICAL

## 2023-09-01 MED ORDER — SIMETHICONE 80 MG PO CHEW: 80.0000 mg | CHEWABLE_TABLET | Freq: Four times a day (QID) | ORAL | Status: DC | PRN

## 2023-09-01 MED ORDER — KETOROLAC TROMETHAMINE 30 MG/ML IJ SOLN
INTRAMUSCULAR | Status: AC
  Filled 2023-09-01: qty 2

## 2023-09-01 MED ORDER — DEXAMETHASONE SODIUM PHOSPHATE 10 MG/ML IJ SOLN
INTRAMUSCULAR | Status: AC
  Filled 2023-09-01: qty 1

## 2023-09-01 MED ORDER — OXYCODONE HCL 5 MG/5ML PO SOLN
5.0000 mg | Freq: Once | ORAL | Status: DC | PRN
Start: 2023-09-01 — End: 2023-09-01

## 2023-09-01 MED ORDER — OXYCODONE HCL 5 MG PO TABS: 5.0000 mg | ORAL_TABLET | Freq: Once | ORAL | Status: DC | PRN

## 2023-09-01 MED ORDER — PROPOFOL 10 MG/ML IV BOLUS
INTRAVENOUS | Status: AC
  Filled 2023-09-01: qty 20

## 2023-09-01 MED ORDER — SUGAMMADEX SODIUM 200 MG/2ML IV SOLN
INTRAVENOUS | Status: DC | PRN
  Administered 2023-09-01: 200 mg via INTRAVENOUS

## 2023-09-01 MED ORDER — ONDANSETRON HCL 4 MG/2ML IJ SOLN: 4.0000 mg | Freq: Four times a day (QID) | INTRAMUSCULAR | Status: DC | PRN

## 2023-09-01 MED ORDER — ACETAMINOPHEN 325 MG PO TABS
650.0000 mg | ORAL_TABLET | ORAL | Status: DC | PRN
  Administered 2023-09-01: 650 mg via ORAL

## 2023-09-01 MED ORDER — FENTANYL CITRATE (PF) 100 MCG/2ML IJ SOLN
INTRAMUSCULAR | Status: DC | PRN
  Administered 2023-09-01: 50 ug via INTRAVENOUS
  Administered 2023-09-01 (×2): 25 ug via INTRAVENOUS
  Administered 2023-09-01: 100 ug via INTRAVENOUS

## 2023-09-01 MED ORDER — OXYCODONE HCL 5 MG PO TABS
5.0000 mg | ORAL_TABLET | ORAL | Status: DC | PRN
Start: 2023-09-01 — End: 2023-09-01
  Administered 2023-09-01: 5 mg via ORAL

## 2023-09-01 MED ORDER — MIDAZOLAM HCL 5 MG/5ML IJ SOLN
INTRAMUSCULAR | Status: DC | PRN
  Administered 2023-09-01: 2 mg via INTRAVENOUS

## 2023-09-01 MED ORDER — ONDANSETRON HCL 4 MG/2ML IJ SOLN
4.0000 mg | Freq: Once | INTRAMUSCULAR | Status: DC | PRN
Start: 2023-09-01 — End: 2023-09-01

## 2023-09-01 MED ORDER — ONDANSETRON HCL 4 MG/2ML IJ SOLN
INTRAMUSCULAR | Status: AC
  Filled 2023-09-01: qty 2

## 2023-09-01 MED ORDER — PROPOFOL 10 MG/ML IV BOLUS
INTRAVENOUS | Status: DC | PRN
  Administered 2023-09-01: 160 mg via INTRAVENOUS

## 2023-09-01 MED ORDER — SODIUM CHLORIDE 0.9 % IV SOLN
Freq: Once | INTRAVENOUS | Status: AC
  Filled 2023-09-01: qty 10

## 2023-09-01 MED ORDER — ONDANSETRON HCL 4 MG/2ML IJ SOLN
INTRAMUSCULAR | Status: DC | PRN
  Administered 2023-09-01: 4 mg via INTRAVENOUS

## 2023-09-01 MED ORDER — ONDANSETRON HCL 4 MG PO TABS: 4.0000 mg | ORAL_TABLET | Freq: Four times a day (QID) | ORAL | Status: DC | PRN

## 2023-09-01 MED ORDER — SODIUM CHLORIDE 0.9 % IV SOLN
INTRAVENOUS | Status: AC
  Filled 2023-09-01: qty 100

## 2023-09-01 MED ORDER — GABAPENTIN 100 MG PO CAPS: 100.0000 mg | ORAL_CAPSULE | Freq: Once | ORAL | Status: DC

## 2023-09-01 MED ORDER — STERILE WATER FOR IRRIGATION IR SOLN
Status: DC | PRN
  Administered 2023-09-01: 500 mL

## 2023-09-01 MED ORDER — SCOPOLAMINE 1 MG/3DAYS TD PT72
1.0000 | MEDICATED_PATCH | TRANSDERMAL | Status: DC
  Administered 2023-09-01: 1.5 mg via TRANSDERMAL

## 2023-09-01 MED ORDER — DEXAMETHASONE SODIUM PHOSPHATE 10 MG/ML IJ SOLN
INTRAMUSCULAR | Status: DC | PRN
  Administered 2023-09-01: 10 mg via INTRAVENOUS

## 2023-09-01 MED ORDER — HYDROMORPHONE HCL 1 MG/ML IJ SOLN
0.2500 mg | INTRAMUSCULAR | Status: DC | PRN
  Administered 2023-09-01: 0.5 mg via INTRAVENOUS

## 2023-09-01 MED ORDER — LACTATED RINGERS IV SOLN: INTRAVENOUS | Status: DC

## 2023-09-01 MED ORDER — BUPIVACAINE HCL (PF) 0.5 % IJ SOLN
INTRAMUSCULAR | Status: DC | PRN
  Administered 2023-09-01: 14 mL

## 2023-09-01 MED ORDER — OXYCODONE HCL 5 MG PO TABS
ORAL_TABLET | ORAL | Status: AC
  Filled 2023-09-01: qty 1

## 2023-09-01 MED ORDER — GABAPENTIN 300 MG PO CAPS
ORAL_CAPSULE | ORAL | Status: AC
  Filled 2023-09-01: qty 1

## 2023-09-01 MED ORDER — LACTATED RINGERS IV SOLN: INTRAVENOUS | Status: AC

## 2023-09-01 MED ORDER — MIDAZOLAM HCL 2 MG/2ML IJ SOLN
INTRAMUSCULAR | Status: AC
  Filled 2023-09-01: qty 2

## 2023-09-01 MED ORDER — DEXMEDETOMIDINE HCL IN NACL 80 MCG/20ML IV SOLN
INTRAVENOUS | Status: DC | PRN
  Administered 2023-09-01: 12 ug via INTRAVENOUS
  Administered 2023-09-01: 8 ug via INTRAVENOUS

## 2023-09-01 MED ORDER — LIDOCAINE 2% (20 MG/ML) 5 ML SYRINGE
INTRAMUSCULAR | Status: DC | PRN
  Administered 2023-09-01: 60 mg via INTRAVENOUS

## 2023-09-01 MED ORDER — KETOROLAC TROMETHAMINE 30 MG/ML IJ SOLN
INTRAMUSCULAR | Status: DC | PRN
  Administered 2023-09-01: 30 mg via INTRAVENOUS

## 2023-09-01 MED ORDER — HYDROMORPHONE HCL 1 MG/ML IJ SOLN
0.2000 mg | INTRAMUSCULAR | Status: DC | PRN
Start: 2023-09-01 — End: 2023-09-01

## 2023-09-01 MED ORDER — HYDROMORPHONE HCL 1 MG/ML IJ SOLN
INTRAMUSCULAR | Status: AC
  Filled 2023-09-01: qty 1

## 2023-09-01 MED ORDER — SCOPOLAMINE 1 MG/3DAYS TD PT72
MEDICATED_PATCH | TRANSDERMAL | Status: AC
Start: 2023-09-01 — End: ?
  Filled 2023-09-01: qty 1

## 2023-09-01 MED ORDER — ACETAMINOPHEN 500 MG PO TABS
1000.0000 mg | ORAL_TABLET | ORAL | Status: AC
  Administered 2023-09-01: 1000 mg via ORAL

## 2023-09-01 MED ORDER — ACETAMINOPHEN 325 MG PO TABS
ORAL_TABLET | ORAL | Status: AC
  Filled 2023-09-01: qty 2

## 2023-09-01 MED ORDER — ROCURONIUM BROMIDE 10 MG/ML (PF) SYRINGE
PREFILLED_SYRINGE | INTRAVENOUS | Status: DC | PRN
  Administered 2023-09-01: 70 mg via INTRAVENOUS

## 2023-09-01 MED ORDER — ROCURONIUM BROMIDE 10 MG/ML (PF) SYRINGE
PREFILLED_SYRINGE | INTRAVENOUS | Status: AC
  Filled 2023-09-01: qty 10

## 2023-09-01 MED ORDER — SODIUM CHLORIDE 0.9 % IV SOLN
2.0000 g | INTRAVENOUS | Status: AC
  Administered 2023-09-01: 2 g via INTRAVENOUS

## 2023-09-01 SURGICAL SUPPLY — 50 items
ADH SKN CLS APL DERMABOND .7 (GAUZE/BANDAGES/DRESSINGS) ×2
CATH FOLEY 3WAY 5CC 16FR (CATHETERS) ×3 IMPLANT
COVER BACK TABLE 60X90IN (DRAPES) ×3 IMPLANT
COVER TIP SHEARS 8 DVNC (MISCELLANEOUS) ×3 IMPLANT
DEFOGGER SCOPE WARMER CLEARIFY (MISCELLANEOUS) ×3 IMPLANT
DERMABOND ADVANCED .7 DNX12 (GAUZE/BANDAGES/DRESSINGS) ×3 IMPLANT
DRAPE ARM DVNC X/XI (DISPOSABLE) ×12 IMPLANT
DRAPE COLUMN DVNC XI (DISPOSABLE) ×3 IMPLANT
DRAPE UTILITY XL STRL (DRAPES) ×3 IMPLANT
DRIVER NDL MEGA SUTCUT DVNCXI (INSTRUMENTS) IMPLANT
DRIVER NDLE MEGA SUTCUT DVNCXI (INSTRUMENTS) ×2
DURAPREP 26ML APPLICATOR (WOUND CARE) ×3 IMPLANT
ELECT REM PT RETURN 9FT ADLT (ELECTROSURGICAL) ×2
ELECTRODE REM PT RTRN 9FT ADLT (ELECTROSURGICAL) ×3 IMPLANT
FORCEPS PROGRASP DVNC XI (FORCEP) IMPLANT
GAUZE 4X4 16PLY ~~LOC~~+RFID DBL (SPONGE) IMPLANT
GLOVE BIO SURGEON STRL SZ 6 (GLOVE) IMPLANT
GLOVE BIOGEL PI IND STRL 6 (GLOVE) IMPLANT
GLOVE BIOGEL PI IND STRL 7.0 (GLOVE) IMPLANT
GLOVE BIOGEL PI IND STRL 7.5 (GLOVE) IMPLANT
GLOVE NEODERM STER SZ 7 (GLOVE) ×9 IMPLANT
HARMONIC RUM II 3.0CM SILVER (DISPOSABLE) ×2
IRRIG SUCT STRYKERFLOW 2 WTIP (MISCELLANEOUS) ×2
IRRIGATION SUCT STRKRFLW 2 WTP (MISCELLANEOUS) ×3 IMPLANT
KIT PINK PAD W/HEAD ARE REST (MISCELLANEOUS) ×2
KIT PINK PAD W/HEAD ARM REST (MISCELLANEOUS) ×3 IMPLANT
KIT TURNOVER CYSTO (KITS) ×3 IMPLANT
LEGGING LITHOTOMY PAIR STRL (DRAPES) ×3 IMPLANT
MANIFOLD NEPTUNE II (INSTRUMENTS) ×3 IMPLANT
OBTURATOR OPTICAL STND 8 DVNC (TROCAR) ×2
OBTURATOR OPTICALSTD 8 DVNC (TROCAR) ×3 IMPLANT
PACK ROBOT WH (CUSTOM PROCEDURE TRAY) ×3 IMPLANT
PACK ROBOTIC GOWN (GOWN DISPOSABLE) ×3 IMPLANT
PAD OB MATERNITY 4.3X12.25 (PERSONAL CARE ITEMS) ×3 IMPLANT
SCALPEL HRMNC RUM II 3.0 SILVR (DISPOSABLE) IMPLANT
SCISSORS MNPLR CVD DVNC XI (INSTRUMENTS) IMPLANT
SCOPETTES 8 STERILE (MISCELLANEOUS) IMPLANT
SEAL UNIV 5-12 XI (MISCELLANEOUS) ×9 IMPLANT
SEALER VESSEL EXT DVNC XI (MISCELLANEOUS) IMPLANT
SET IRRIG Y TYPE TUR BLADDER L (SET/KITS/TRAYS/PACK) ×3 IMPLANT
SET TUBE SMOKE EVAC HIGH FLOW (TUBING) ×3 IMPLANT
SLEEVE SCD COMPRESS KNEE MED (STOCKING) ×3 IMPLANT
SOL PREP POV-IOD 4OZ 10% (MISCELLANEOUS) IMPLANT
SPIKE FLUID TRANSFER (MISCELLANEOUS) ×3 IMPLANT
SUT MNCRL AB 4-0 PS2 18 (SUTURE) ×3 IMPLANT
SUT VLOC 180 0 9IN GS21 (SUTURE) ×3 IMPLANT
TIP UTERINE 6.7X8CM BLUE DISP (MISCELLANEOUS) IMPLANT
TOWEL OR 17X24 6PK STRL BLUE (TOWEL DISPOSABLE) ×3 IMPLANT
UNDERPAD 30X36 HEAVY ABSORB (UNDERPADS AND DIAPERS) ×3 IMPLANT
WATER STERILE IRR 500ML POUR (IV SOLUTION) ×3 IMPLANT

## 2023-09-01 NOTE — Anesthesia Procedure Notes (Signed)
Procedure Name: Intubation Date/Time: 09/01/2023 10:51 AM  Performed by: Bishop Limbo, CRNAPre-anesthesia Checklist: Patient identified, Emergency Drugs available, Suction available and Patient being monitored Patient Re-evaluated:Patient Re-evaluated prior to induction Oxygen Delivery Method: Circle System Utilized Preoxygenation: Pre-oxygenation with 100% oxygen Induction Type: IV induction Ventilation: Mask ventilation without difficulty Laryngoscope Size: Mac and 3 Grade View: Grade I Tube type: Oral Tube size: 7.0 mm Number of attempts: 1 Airway Equipment and Method: Stylet Placement Confirmation: ETT inserted through vocal cords under direct vision, positive ETCO2 and breath sounds checked- equal and bilateral Secured at: 22 cm Tube secured with: Tape Dental Injury: Teeth and Oropharynx as per pre-operative assessment

## 2023-09-01 NOTE — Interval H&P Note (Signed)
History and Physical Interval Note:  09/01/2023 10:05 AM  Kimberly [name]  has presented today for surgery, with the diagnosis of dysfunctional uterine bleeding, Menorrhagia with regular cycle Menorrhagia with irregular cycle, Adenomyosis.  The various methods of treatment have been discussed with the patient and family. After consideration of risks, benefits and other options for treatment, the patient has consented to  Procedure(s): XI ROBOTIC ASSISTED LAPAROSCOPIC HYSTERECTOMY AND SALPINGECTOMY (Bilateral) CYSTOSCOPY (N/A) as a surgical intervention.  The patient's history has been reviewed, patient examined, no change in status, stable for surgery.  I have reviewed the patient's chart and labs.  Questions were answered to the patient's satisfaction.     Earley Favor

## 2023-09-01 NOTE — Anesthesia Preprocedure Evaluation (Signed)
Anesthesia Evaluation  Patient identified by MRN, date of birth, ID band Patient awake    Reviewed: Allergy & Precautions, NPO status , Patient's Chart, lab work & pertinent test results  History of Anesthesia Complications Negative for: history of anesthetic complications  Airway Mallampati: II  TM Distance: >3 FB Neck ROM: Full    Dental  (+) Teeth Intact, Dental Advisory Given   Pulmonary pneumonia, resolved, Patient abstained from smoking.   Pulmonary exam normal breath sounds clear to auscultation       Cardiovascular negative cardio ROS Normal cardiovascular exam Rhythm:Regular Rate:Normal     Neuro/Psych  PSYCHIATRIC DISORDERS Anxiety Depression    ADDAdmitted Jan 2020 for Guillain Barre vs severe thiamine deficiency; never had respiratory weakness Polyneuropathy- thought alcohol related residual numbness feet  Neuromuscular disease    GI/Hepatic ,GERD  Medicated,,(+)     substance abuse  alcohol useETOH abuse in remission IBS   Endo/Other  negative endocrine ROS  GLP-1 RA therapy- last dose  Renal/GU negative Renal ROS  negative genitourinary   Musculoskeletal  (+)  Fibromyalgia -  Abdominal   Peds  Hematology negative hematology ROS (+)   Anesthesia Other Findings   Reproductive/Obstetrics                              Anesthesia Physical Anesthesia Plan  ASA: 2  Anesthesia Plan: General   Post-op Pain Management: Tylenol PO (pre-op)*, Precedex and Dilaudid IV   Induction: Intravenous  PONV Risk Score and Plan: 4 or greater and Ondansetron, Dexamethasone, Treatment may vary due to age or medical condition and Midazolam  Airway Management Planned: Oral ETT  Additional Equipment: None  Intra-op Plan:   Post-operative Plan: Extubation in OR  Informed Consent: I have reviewed the patients History and Physical, chart, labs and discussed the procedure including the  risks, benefits and alternatives for the proposed anesthesia with the patient or authorized representative who has indicated his/her understanding and acceptance.     Dental advisory given  Plan Discussed with: Anesthesiologist and CRNA  Anesthesia Plan Comments:          Anesthesia Quick Evaluation

## 2023-09-01 NOTE — Anesthesia Postprocedure Evaluation (Signed)
Anesthesia Post Note  Patient: [name]  Procedure(s) Performed: XI ROBOTIC ASSISTED LAPAROSCOPIC HYSTERECTOMY AND SALPINGECTOMY (Bilateral: Abdomen) CYSTOSCOPY (Urethra)     Patient location during evaluation: PACU Anesthesia Type: General Level of consciousness: awake and alert and oriented Pain management: pain level controlled Vital Signs Assessment: post-procedure vital signs reviewed and stable Respiratory status: spontaneous breathing, nonlabored ventilation and respiratory function stable Cardiovascular status: blood pressure returned to baseline and stable Postop Assessment: no apparent nausea or vomiting Anesthetic complications: no   No notable events documented.  Last Vitals:  Vitals:   09/01/23 1245 09/01/23 1300  BP: 101/67 106/72  Pulse: 60 70  Resp: 15 20  Temp:    SpO2: 91% 96%    Last Pain:  Vitals:   09/01/23 1245  TempSrc:   PainSc: Asleep                 Gumecindo Hopkin A.

## 2023-09-01 NOTE — Transfer of Care (Signed)
Immediate Anesthesia Transfer of Care Note  Patient: Kimberly [name]  Procedure(s) Performed: XI ROBOTIC ASSISTED LAPAROSCOPIC HYSTERECTOMY AND SALPINGECTOMY (Bilateral: Abdomen) CYSTOSCOPY (Urethra)  Patient Location: PACU  Anesthesia Type:General  Level of Consciousness: awake, alert , oriented, and patient cooperative  Airway & Oxygen Therapy: Patient Spontanous Breathing and Patient connected to nasal cannula oxygen  Post-op Assessment: Report given to RN and Post -op Vital signs reviewed and stable  Post vital signs: Reviewed and stable  Last Vitals:  Vitals Value Taken Time  BP 116/65 09/01/23 1215  Temp    Pulse 83 09/01/23 1219  Resp 16 09/01/23 1219  SpO2 99 % 09/01/23 1219  Vitals shown include unfiled device data.  Last Pain:  Vitals:   09/01/23 0736  TempSrc: Oral  PainSc: 0-No pain      Patients Stated Pain Goal: 4 (09/01/23 0736)  Complications: No notable events documented.

## 2023-09-01 NOTE — Op Note (Signed)
09/01/2023  [id number] [name]        OPERATIVE REPORT   Preop Diagnosis: menorrhagia, dysmenorrhea, anemia, fibroids  Procedure: robotic hysterectomy, bilateral salpingectomy, cystoscopy   Surgeon: Dr. Orvil Feil Renan Danese Assistant: Theodora Blow, RN   Fluids: please see anesthesia report   Complications: None Anesthesia: General     Findings:  boggy contour 8cm uterus, normal ovaries and tubes Cystoscopy at the end of the case with normal bladder and patent ureters bilaterally.   Estimated blood loss: Minimal <15cc   Specimens: Uterus, cervix and bilateral tubes   Disposition of specimen: Pathology           Patient is taken to the operating room. She is placed in the supine position. She is a running IV in place. Informed consent was present on the chart. SCDs on her lower extremities and functioning properly. Patient was positioned while she was awake.  Her legs were placed in the low lithotomy position in [place] stirrups. Her arms were tucked by the side.  General endotracheal anesthesia was administered by the anesthesia staff without difficulty.       Clora prep was then used to prep the abdomen and Hibiclens was used to prep the inner thighs, perineum and vagina. Once 3 minutes had past the patient was draped in a normal standard fashion. A proper time out was performed and everyone agreed.  The legs were lifted to the high lithotomy position. A bivalve speculum was inserted into the vagina and the anterior lip of the cervix was grasped with single-tooth tenaculum.  The uterus sounded to 8 cm. Pratt dilators were used to dilate the cervix.  The RUMI uterine manipulator was obtained inserted into the endometrial cavity and the bulb of the disposable tip was inflated with 8 cc of normal saline. There was a good fit of the KOH ring around the cervix. The tenaculum and bivavle speculum was removed. There is also good manipulation of the uterus.  A Foley catheter  was placed to straight drain.  Clear urine was noted. Legs were lowered to the low lithotomy position and attention was turned the abdomen.   Superior to the umbilicus, marcaine 0.25% used to anesthetize the skin.  Using #11 blade, 8mm skin incision was made.  The 8mm robotic trocar and sleeve was inserted under direct visualization.  CO2 gas was  started and patient was placed in trendelenburg position.  Two additional 8mm ports were placed under direct visualization in the left and right lower quadrant.     Ureters were identifies.  Attention was turned to the left side. The left tube was elevated and the mesosalpinx was desiccated with the vessel sealer and ovary was removed prior to this.  The left uterine ovarian pedicle was serially clamped cauterized and incised. Left round ligament was serially clamped cauterized and incised. The anterior and posterior peritoneum of the inferior leaf of the broad ligament were opened. The beginning of the bladder flap was created.  The bladder was taken down below the level of the KOH ring. The left uterine artery skeletonized and then just superior to the KOH ring this vessel was serially clamped, cauterized, and incised.   Attention was turned the right side.  The uterus was placed on stretch to the opposite side.    The mesosalpinx was incised freeing the tube. Then the right uterine ovarian pedicle was serially clamped cauterized and incised. Next the right round ligament was serially clamped cauterized and incised. The anterior posterior peritoneum  of the inferiorly for the broad ligament were opened. The anterior peritoneum was carried across to the dissection on the left side. The remainder of the bladder flap was created using sharp dissection. The bladder was well below the level of the KOH ring. The right uterine artery skeletonized. Then the right uterine artery, above the level of the KOH ring, was serially clamped cauterized and incised. The uterus was  devascularized at this point.   The colpotomy was performed.  This was carried around a circumferential fashion until the vaginal mucosa was completely incised in the specimen was freed.  The specimen was then delivered to the vagina.  A vaginal occlusive device was used to maintain the pneumoperitoneum   Instruments were changed with a needle driver and prograsp.  Using a 9 inch  zero V-lock suture, the cuff was closed by incorporating the anterior and posterior vaginal mucosa in each stitch. This was carried across all the way to the left corner and a running fashion. Two stitches were brought back towards the midline and the suture was cut flush with the vagina. The needle was brought out the pelvis. The pelvis was irrigated. All pedicles were inspected. No bleeding was noted.   Co2 pressures were lowered to 8mm Hg.  Again, no bleeding was noted.  Ureters were noted deep in the pelvis to be peristalsing.  At this point the procedure was completed.  The remaining instruments were removed.  The ports were removed under direct visualization of the laparoscope and the pneumoperitoneum was relieved.   The skin was then closed with subcuticular stitches of 3-0 Vicryl. The skin was cleansed Dermabond was applied. Attention was then turned the vagina and the cuff was inspected. No bleeding was noted.  The Foley catheter was removed.  Cystoscopy was performed.  No sutures or bladder injuries were noted.  Ureters were noted with normal urine jets from each one was seen.  Foley was left out after the cystoscopic fluid was drained and cystoscope removed.  Sponge, lap, needle, instrument counts were correct x2. Patient tolerated the procedure very well. She was awakened from anesthesia, extubated and taken to recovery in stable condition.      Dr. Karma Greaser

## 2023-09-01 NOTE — Discharge Instructions (Addendum)
   No acetaminophen/Tylenol until after 1:40 pm today if needed.  No ibuprofen, Advil, Aleve, Motrin, ketorolac, meloxicam, naproxen, or other NSAIDS until after 5:45 pm today if needed.     Post Anesthesia Home Care Instructions  Activity: Get plenty of rest for the remainder of the day. A responsible individual must stay with you for 24 hours following the procedure.  For the next 24 hours, DO NOT: -Drive a car -Advertising copywriter -Drink alcoholic beverages -Take any medication unless instructed by your physician -Make any legal decisions or sign important papers.  Meals: Start with liquid foods such as gelatin or soup. Progress to regular foods as tolerated. Avoid greasy, spicy, heavy foods. If nausea and/or vomiting occur, drink only clear liquids until the nausea and/or vomiting subsides. Call your physician if vomiting continues.  Special Instructions/Symptoms: Your throat may feel dry or sore from the anesthesia or the breathing tube placed in your throat during surgery. If this causes discomfort, gargle with warm salt water. The discomfort should disappear within 24 hours.  If you had a scopolamine patch placed behind your ear for the management of post- operative nausea and/or vomiting:  1. The medication in the patch is effective for 72 hours, after which it should be removed.  Wrap patch in a tissue and discard in the trash. Wash hands thoroughly with soap and water. 2. You may remove the patch earlier than 72 hours if you experience unpleasant side effects which may include dry mouth, dizziness or visual disturbances. 3. Avoid touching the patch. Wash your hands with soap and water after contact with the patch.

## 2023-09-04 ENCOUNTER — Ambulatory Visit: Payer: BC Managed Care – PPO | Admitting: Obstetrics and Gynecology

## 2023-09-04 ENCOUNTER — Encounter: Payer: Self-pay | Admitting: Obstetrics and Gynecology

## 2023-09-04 ENCOUNTER — Telehealth: Payer: Self-pay

## 2023-09-04 VITALS — BP 110/72 | HR 92 | Temp 98.1°F

## 2023-09-04 DIAGNOSIS — N390 Urinary tract infection, site not specified: Secondary | ICD-10-CM | POA: Diagnosis not present

## 2023-09-04 DIAGNOSIS — R309 Painful micturition, unspecified: Secondary | ICD-10-CM

## 2023-09-04 LAB — SURGICAL PATHOLOGY

## 2023-09-04 NOTE — Telephone Encounter (Signed)
Left message to call Rector Devonshire, RN at [facility], [phone], option 5.  

## 2023-09-04 NOTE — Telephone Encounter (Signed)
Spoke with patient. Patient is s/p robot RLH, BS, cysto 09/01/23.  Patient reports dysuria and at least 2 episodes of urinary incontinence at night since surgery. States pain is internal when voiding, does not feel like pain is from urine touching the skin. States she is drinking fluids, unsure if voiding normal amounts. Denies fever/chills, N/V, or flank pain. Describes bleeding as spotting.   OV scheduled for today at 1330 with Dr. Karma Greaser.   Routing to provider for final review. Patient is agreeable to disposition. Will close encounter.

## 2023-09-04 NOTE — Progress Notes (Unsigned)
Patient came for post op problem visit today & had to reschedule due to Dr Karma Greaser being in surgery. See telephone encounter 09-04-23

## 2023-09-04 NOTE — Telephone Encounter (Signed)
Spoke with Dr. Karma Greaser, notified of results and request from patient. Dr. Karma Greaser will call patient this afternoon.

## 2023-09-04 NOTE — Telephone Encounter (Signed)
Patient came for post op problem visit & is wanting to know if there is anything else she can take instead of the oxycodone. She said it is way to strong for her. She has been taking the ibuprofen 800mg  but feels like she needs something else. Patient complains of pain when she urinates that is internal. She also had an incision that burned when it touched her clothing. All of her steri strips had come off. No red streaking,open or oozing area. Steri-strips applied. Patient to reschedule office visit. Urine results placed on providers desk for review. Routed to Dr. Karma Greaser

## 2023-09-05 ENCOUNTER — Encounter (HOSPITAL_BASED_OUTPATIENT_CLINIC_OR_DEPARTMENT_OTHER): Payer: Self-pay | Admitting: Obstetrics and Gynecology

## 2023-09-05 MED ORDER — NITROFURANTOIN MONOHYD MACRO 100 MG PO CAPS: 100.0000 mg | ORAL_CAPSULE | Freq: Two times a day (BID) | ORAL | 0 refills | Status: DC

## 2023-09-05 MED ORDER — LIDOCAINE 5 % EX OINT: 1.0000 | TOPICAL_OINTMENT | Freq: Four times a day (QID) | CUTANEOUS | 0 refills | Status: AC | PRN

## 2023-09-05 MED ORDER — KETOROLAC TROMETHAMINE 10 MG PO TABS: 10.0000 mg | ORAL_TABLET | Freq: Four times a day (QID) | ORAL | 0 refills | Status: DC | PRN

## 2023-09-05 NOTE — Telephone Encounter (Signed)
See notes dated 09/04/23.   Encounter closed.

## 2023-09-05 NOTE — Progress Notes (Signed)
Spoke with patient.  Feels like a knife in her bladder when she voids. No fevers.  No cva tenderness. Feels like she is voiding adequately. TO begin macrobid for UTI. UC pending. Lidocaine and estrogen to apply to urethra Toradol for pain that is less strong than the oxycodone.  Discussed she should not take this with her other nsaids. Dr. Karma Greaser

## 2023-09-06 ENCOUNTER — Ambulatory Visit: Payer: BC Managed Care – PPO | Admitting: Obstetrics and Gynecology

## 2023-09-06 LAB — URINE CULTURE
MICRO NUMBER:: 15651108
Result:: NO GROWTH
SPECIMEN QUALITY:: ADEQUATE

## 2023-09-06 LAB — URINALYSIS, COMPLETE W/RFL CULTURE
Glucose, UA: NEGATIVE
Hyaline Cast: NONE SEEN /LPF
Nitrites, Initial: NEGATIVE
Specific Gravity, Urine: 1.025 (ref 1.001–1.035)
pH: 6 (ref 5.0–8.0)

## 2023-09-06 LAB — CULTURE INDICATED

## 2023-09-11 DIAGNOSIS — F321 Major depressive disorder, single episode, moderate: Secondary | ICD-10-CM | POA: Diagnosis not present

## 2023-09-11 DIAGNOSIS — F4310 Post-traumatic stress disorder, unspecified: Secondary | ICD-10-CM | POA: Diagnosis not present

## 2023-09-11 DIAGNOSIS — F411 Generalized anxiety disorder: Secondary | ICD-10-CM | POA: Diagnosis not present

## 2023-09-11 DIAGNOSIS — F1010 Alcohol abuse, uncomplicated: Secondary | ICD-10-CM | POA: Diagnosis not present

## 2023-09-14 ENCOUNTER — Encounter: Payer: BC Managed Care – PPO | Admitting: Obstetrics and Gynecology

## 2023-09-15 ENCOUNTER — Encounter: Payer: BC Managed Care – PPO | Admitting: Obstetrics and Gynecology

## 2023-09-19 ENCOUNTER — Ambulatory Visit (INDEPENDENT_AMBULATORY_CARE_PROVIDER_SITE_OTHER): Payer: BC Managed Care – PPO | Admitting: Licensed Clinical Social Worker

## 2023-09-19 DIAGNOSIS — F4312 Post-traumatic stress disorder, chronic: Secondary | ICD-10-CM

## 2023-09-19 DIAGNOSIS — F411 Generalized anxiety disorder: Secondary | ICD-10-CM

## 2023-09-19 DIAGNOSIS — F1021 Alcohol dependence, in remission: Secondary | ICD-10-CM

## 2023-09-19 NOTE — Progress Notes (Signed)
THERAPIST PROGRESS NOTE  Session Time: 3:02 p.m. to 4:05 p.m.   Type of Therapy: Individual   Therapist Response/Interventions: Solution-Focused and CBT/The therapist makes the observation that Fiana's mood seems good per the PHQ-9 and GAD-7.  He does encourage her to resume working steps.  In response to Danae's statement about avoiding anger and resentment, the therapist agrees that resentment is counterproductive; however, he suggests that anger can be a productive emotion if channeled into behavior change.  He makes the observation that Lataisha is in limbo at present to which she is in agreement as she seems to be waiting on her higher power to point the way.  Treatment Goals addressed:  Problem: Dual Diagnosis Goal:  Laelani will abstain from alcohol 30/30 days per self-report and random breathalyzer by con  Goal: Lamyia's depression and anxiety will improved as evidenced by her PHQ-9 dropping from a 9 to a 4 or less and her GAD-7 dropping to 15 to a 4 or less.     Summary: Camiryn returns saying that her aunt and step-grandmother both passed away such that she is now not working. Her PHQ-9 is a 0 and GAD-7 is a 4. She has about a year-and-nine months of sobriety She has not asked her husband about refinancing the house.  Since doing her 5th Step, she is living life with the attitude of whatever happens happens. She is getting along with her husband by not thinking about the "bad shit;" and they have been hunting together.   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   Fenix talks about issues that she had with one of her sons noting that their relationship has gotten better.  She says that she has gotten him into therapy and that he had told her that biting himself at times felt good.  She says that she is not sure what to say or not say to him about this.  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  She says that he will probably need to invest the money from the cows to start farming the other half of the property.  Collette concludes that she is going to leave their financial situation and her vocation up to God for now and see what God does.  Kapri has stalled ambulation to working steps and has not begun her sixth step saying that she has the book what all the questions next to her at times but will not pick it up.  At the conclusion of the session, in response to this therapist questioning whether or not this session was beneficial for her, Toriah reports that it was beneficial and that she is feeling better.  The therapist does not update her treatment plan as it is unclear as to what her goals are at present as she seems to be in limbo at the moment.  She agrees to schedule again in the future on a as needed basis.  Progress Towards Goals: Progressing  Suicidal/Homicidal: no SI or HI reported  Plan: Return again on a p.r.n. basis  Diagnosis: Alcohol Dependence, Severe in early remission; PTSD; GAD, and Major Depression, Recurrent, Moderate  Collaboration of Care: Other N/A  Patient/Guardian was advised Release of Information must be obtained prior to any record release in order to collaborate their care  with an outside provider. Patient/Guardian was advised if they have not already done so to contact the registration department to sign all necessary forms in order for [name] to release information regarding their care.   Consent: Patient/Guardian gives verbal consent for treatment and assignment of benefits for services provided during this visit. Patient/Guardian expressed understanding and agreed to proceed.   Myrna Blazer, MA, LCSW, [facility], LCAS 09/19/2023

## 2023-09-20 ENCOUNTER — Ambulatory Visit (INDEPENDENT_AMBULATORY_CARE_PROVIDER_SITE_OTHER): Payer: BC Managed Care – PPO | Admitting: Obstetrics and Gynecology

## 2023-09-20 ENCOUNTER — Encounter: Payer: Self-pay | Admitting: Obstetrics and Gynecology

## 2023-09-20 VITALS — BP 116/70 | HR 101

## 2023-09-20 DIAGNOSIS — R309 Painful micturition, unspecified: Secondary | ICD-10-CM

## 2023-09-20 NOTE — Progress Notes (Signed)
 Patient presents for 2 week postop from [facility], bilateral salpingectomy, cystoscopy. She is doing well. No fevers, VB, dysuria or severe abdominal pain.  Blood pressure 104/70, pulse 72, weight 184 lb (83.5 kg), last menstrual period 08/09/2023, SpO2 100%.  Abdomen: incisions I/c/d, NT, ND  A/p PO from [facility] 2 weeks doing well Encouraged no heavy lifting, pushing, pulling greater than 10 lbs for full 8 weeks 2. Pelvic rest for the entire 10 wks 3. RTC with any concerns or with heavy bleeding, fevers or severe abdominal pain.  Dr. Karma Greaser

## 2023-09-21 DIAGNOSIS — F321 Major depressive disorder, single episode, moderate: Secondary | ICD-10-CM | POA: Diagnosis not present

## 2023-09-21 DIAGNOSIS — F1010 Alcohol abuse, uncomplicated: Secondary | ICD-10-CM | POA: Diagnosis not present

## 2023-09-21 DIAGNOSIS — F4310 Post-traumatic stress disorder, unspecified: Secondary | ICD-10-CM | POA: Diagnosis not present

## 2023-09-21 DIAGNOSIS — F411 Generalized anxiety disorder: Secondary | ICD-10-CM | POA: Diagnosis not present

## 2023-09-22 LAB — URINE CULTURE
MICRO NUMBER:: 15725123
Result:: NO GROWTH
SPECIMEN QUALITY:: ADEQUATE

## 2023-09-22 LAB — URINALYSIS, COMPLETE W/RFL CULTURE
Glucose, UA: NEGATIVE
Hgb urine dipstick: NEGATIVE
Hyaline Cast: NONE SEEN /[LPF]
Leukocyte Esterase: NEGATIVE
Nitrites, Initial: NEGATIVE
RBC / HPF: NONE SEEN /[HPF] (ref 0–2)
Specific Gravity, Urine: 1.02 (ref 1.001–1.035)
pH: 6 (ref 5.0–8.0)

## 2023-09-22 LAB — CULTURE INDICATED

## 2023-09-29 DIAGNOSIS — F4310 Post-traumatic stress disorder, unspecified: Secondary | ICD-10-CM | POA: Diagnosis not present

## 2023-09-29 DIAGNOSIS — F321 Major depressive disorder, single episode, moderate: Secondary | ICD-10-CM | POA: Diagnosis not present

## 2023-09-29 DIAGNOSIS — F411 Generalized anxiety disorder: Secondary | ICD-10-CM | POA: Diagnosis not present

## 2023-09-29 DIAGNOSIS — F1010 Alcohol abuse, uncomplicated: Secondary | ICD-10-CM | POA: Diagnosis not present

## 2023-10-12 DIAGNOSIS — F4310 Post-traumatic stress disorder, unspecified: Secondary | ICD-10-CM | POA: Diagnosis not present

## 2023-10-12 DIAGNOSIS — F411 Generalized anxiety disorder: Secondary | ICD-10-CM | POA: Diagnosis not present

## 2023-10-12 DIAGNOSIS — F1010 Alcohol abuse, uncomplicated: Secondary | ICD-10-CM | POA: Diagnosis not present

## 2023-10-12 DIAGNOSIS — F321 Major depressive disorder, single episode, moderate: Secondary | ICD-10-CM | POA: Diagnosis not present

## 2023-10-17 DIAGNOSIS — F4310 Post-traumatic stress disorder, unspecified: Secondary | ICD-10-CM | POA: Diagnosis not present

## 2023-10-17 DIAGNOSIS — F321 Major depressive disorder, single episode, moderate: Secondary | ICD-10-CM | POA: Diagnosis not present

## 2023-10-17 DIAGNOSIS — F1010 Alcohol abuse, uncomplicated: Secondary | ICD-10-CM | POA: Diagnosis not present

## 2023-10-17 DIAGNOSIS — F411 Generalized anxiety disorder: Secondary | ICD-10-CM | POA: Diagnosis not present

## 2023-10-22 ENCOUNTER — Other Ambulatory Visit (HOSPITAL_COMMUNITY): Payer: Self-pay | Admitting: Medical

## 2023-10-23 NOTE — Telephone Encounter (Signed)
No longer my patient.

## 2023-10-24 ENCOUNTER — Other Ambulatory Visit (HOSPITAL_COMMUNITY): Payer: Self-pay | Admitting: Medical

## 2023-10-25 DIAGNOSIS — F321 Major depressive disorder, single episode, moderate: Secondary | ICD-10-CM | POA: Diagnosis not present

## 2023-10-25 DIAGNOSIS — F1010 Alcohol abuse, uncomplicated: Secondary | ICD-10-CM | POA: Diagnosis not present

## 2023-10-25 DIAGNOSIS — F411 Generalized anxiety disorder: Secondary | ICD-10-CM | POA: Diagnosis not present

## 2023-10-25 DIAGNOSIS — F4310 Post-traumatic stress disorder, unspecified: Secondary | ICD-10-CM | POA: Diagnosis not present

## 2023-10-26 NOTE — Telephone Encounter (Signed)
Pt needs to be seen for refills from this Provider Should be getting rx from her PCP

## 2023-10-30 ENCOUNTER — Encounter (HOSPITAL_COMMUNITY): Payer: Self-pay | Admitting: Obstetrics and Gynecology

## 2023-10-30 ENCOUNTER — Ambulatory Visit (INDEPENDENT_AMBULATORY_CARE_PROVIDER_SITE_OTHER): Payer: BC Managed Care – PPO | Admitting: Obstetrics and Gynecology

## 2023-10-30 ENCOUNTER — Inpatient Hospital Stay (HOSPITAL_COMMUNITY): Payer: BC Managed Care – PPO

## 2023-10-30 ENCOUNTER — Encounter: Payer: Self-pay | Admitting: Obstetrics and Gynecology

## 2023-10-30 ENCOUNTER — Encounter (HOSPITAL_COMMUNITY)
Admission: AD | Disposition: A | Discharge status: Home / Self Care | Payer: Self-pay | Source: Home / Self Care | Attending: Obstetrics and Gynecology

## 2023-10-30 ENCOUNTER — Other Ambulatory Visit: Payer: Self-pay

## 2023-10-30 ENCOUNTER — Inpatient Hospital Stay (HOSPITAL_COMMUNITY)
Admission: AD | Admit: 2023-10-30 | Discharge: 2023-11-01 | DRG: 921 | Disposition: A | Discharge status: Home / Self Care | Payer: BC Managed Care – PPO | Attending: Obstetrics and Gynecology | Admitting: Obstetrics and Gynecology

## 2023-10-30 VITALS — BP 128/86 | HR 97 | Ht 67.5 in | Wt 149.0 lb

## 2023-10-30 DIAGNOSIS — F988 Other specified behavioral and emotional disorders with onset usually occurring in childhood and adolescence: Secondary | ICD-10-CM | POA: Diagnosis present

## 2023-10-30 DIAGNOSIS — F17210 Nicotine dependence, cigarettes, uncomplicated: Secondary | ICD-10-CM | POA: Diagnosis present

## 2023-10-30 DIAGNOSIS — K219 Gastro-esophageal reflux disease without esophagitis: Secondary | ICD-10-CM | POA: Diagnosis present

## 2023-10-30 DIAGNOSIS — X58XXXA Exposure to other specified factors, initial encounter: Secondary | ICD-10-CM | POA: Diagnosis not present

## 2023-10-30 DIAGNOSIS — T81328A Disruption or dehiscence of closure of other specified internal operation (surgical) wound, initial encounter: Principal | ICD-10-CM | POA: Diagnosis present

## 2023-10-30 DIAGNOSIS — Z8616 Personal history of COVID-19: Secondary | ICD-10-CM

## 2023-10-30 DIAGNOSIS — Z818 Family history of other mental and behavioral disorders: Secondary | ICD-10-CM

## 2023-10-30 DIAGNOSIS — Z8701 Personal history of pneumonia (recurrent): Secondary | ICD-10-CM

## 2023-10-30 DIAGNOSIS — Z79899 Other long term (current) drug therapy: Secondary | ICD-10-CM

## 2023-10-30 DIAGNOSIS — F32A Depression, unspecified: Secondary | ICD-10-CM | POA: Diagnosis not present

## 2023-10-30 DIAGNOSIS — Z7985 Long-term (current) use of injectable non-insulin antidiabetic drugs: Secondary | ICD-10-CM

## 2023-10-30 DIAGNOSIS — Z8249 Family history of ischemic heart disease and other diseases of the circulatory system: Secondary | ICD-10-CM

## 2023-10-30 DIAGNOSIS — K589 Irritable bowel syndrome without diarrhea: Secondary | ICD-10-CM | POA: Diagnosis present

## 2023-10-30 DIAGNOSIS — N8189 Other female genital prolapse: Secondary | ICD-10-CM | POA: Diagnosis not present

## 2023-10-30 DIAGNOSIS — Z90710 Acquired absence of both cervix and uterus: Principal | ICD-10-CM | POA: Diagnosis present

## 2023-10-30 DIAGNOSIS — F419 Anxiety disorder, unspecified: Secondary | ICD-10-CM | POA: Diagnosis present

## 2023-10-30 HISTORY — PX: REPAIR VAGINAL CUFF: SHX6067

## 2023-10-30 HISTORY — PX: LAPAROSCOPY: SHX197

## 2023-10-30 LAB — RAPID HIV SCREEN (HIV 1/2 AB+AG)
HIV 1/2 Antibodies: NONREACTIVE
HIV-1 P24 Antigen - HIV24: NONREACTIVE

## 2023-10-30 LAB — BASIC METABOLIC PANEL
Anion gap: 8 (ref 5–15)
BUN: 11 mg/dL (ref 6–20)
CO2: 26 mmol/L (ref 22–32)
Calcium: 8.7 mg/dL — ABNORMAL LOW (ref 8.9–10.3)
Chloride: 104 mmol/L (ref 98–111)
Creatinine, Ser: 0.6 mg/dL (ref 0.44–1.00)
GFR, Estimated: >60 mL/min (ref >60)
Glucose, Bld: 99 mg/dL (ref 70–99)
Potassium: 3.4 mmol/L — ABNORMAL LOW (ref 3.5–5.1)
Sodium: 138 mmol/L (ref 135–145)

## 2023-10-30 LAB — CBC
HCT: 42.8 % (ref 36.0–46.0)
Hemoglobin: 14.6 g/dL (ref 12.0–15.0)
MCH: 30 pg (ref 26.0–34.0)
MCHC: 34.1 g/dL (ref 30.0–36.0)
MCV: 88.1 fL (ref 80.0–100.0)
Platelets: 284 10*3/uL (ref 150–400)
RBC: 4.86 MIL/uL (ref 3.87–5.11)
RDW: 14.6 % (ref 11.5–15.5)
WBC: 18.2 10*3/uL — ABNORMAL HIGH (ref 4.0–10.5)
nRBC: 0 % (ref 0.0–0.2)

## 2023-10-30 LAB — TYPE AND SCREEN
ABO/RH(D): O NEG
Antibody Screen: NEGATIVE

## 2023-10-30 SURGERY — REPAIR, VAGINAL CUFF
Anesthesia: General

## 2023-10-30 MED ORDER — ONDANSETRON HCL 4 MG/2ML IJ SOLN
INTRAMUSCULAR | Status: DC | PRN
  Administered 2023-10-30: 4 mg via INTRAVENOUS

## 2023-10-30 MED ORDER — MIDAZOLAM HCL 2 MG/2ML IJ SOLN
INTRAMUSCULAR | Status: AC
Start: 2023-10-30 — End: ?
  Filled 2023-10-30: qty 2

## 2023-10-30 MED ORDER — MIDAZOLAM HCL 5 MG/5ML IJ SOLN
INTRAMUSCULAR | Status: DC | PRN
  Administered 2023-10-30: 2 mg via INTRAVENOUS

## 2023-10-30 MED ORDER — GABAPENTIN 300 MG PO CAPS
300.0000 mg | ORAL_CAPSULE | ORAL | Status: AC
  Administered 2023-10-30: 300 mg via ORAL
  Filled 2023-10-30: qty 1

## 2023-10-30 MED ORDER — PANTOPRAZOLE SODIUM 40 MG IV SOLR
40.0000 mg | Freq: Every day | INTRAVENOUS | Status: DC
  Administered 2023-10-30: 40 mg via INTRAVENOUS
  Filled 2023-10-30: qty 10

## 2023-10-30 MED ORDER — GABAPENTIN 100 MG PO CAPS
100.0000 mg | ORAL_CAPSULE | Freq: Once | ORAL | Status: AC
  Administered 2023-10-30: 100 mg via ORAL
  Filled 2023-10-30: qty 1

## 2023-10-30 MED ORDER — SUGAMMADEX SODIUM 200 MG/2ML IV SOLN
INTRAVENOUS | Status: DC | PRN
  Administered 2023-10-30: 200 mg via INTRAVENOUS

## 2023-10-30 MED ORDER — DEXMEDETOMIDINE HCL IN NACL 80 MCG/20ML IV SOLN
INTRAVENOUS | Status: AC
  Filled 2023-10-30: qty 20

## 2023-10-30 MED ORDER — ROCURONIUM BROMIDE 10 MG/ML (PF) SYRINGE
PREFILLED_SYRINGE | INTRAVENOUS | Status: AC
  Filled 2023-10-30: qty 10

## 2023-10-30 MED ORDER — BISACODYL 10 MG RE SUPP: 10.0000 mg | Freq: Every day | RECTAL | Status: DC | PRN

## 2023-10-30 MED ORDER — LACTATED RINGERS IV SOLN: Freq: Once | INTRAVENOUS | Status: AC

## 2023-10-30 MED ORDER — FENTANYL CITRATE (PF) 100 MCG/2ML IJ SOLN
INTRAMUSCULAR | Status: DC | PRN
  Administered 2023-10-30 (×4): 50 ug via INTRAVENOUS

## 2023-10-30 MED ORDER — CLINDAMYCIN PHOSPHATE 900 MG/50ML IV SOLN
900.0000 mg | Freq: Three times a day (TID) | INTRAVENOUS | Status: AC
  Administered 2023-10-31 – 2023-11-01 (×6): 900 mg via INTRAVENOUS
  Filled 2023-10-30 (×6): qty 50

## 2023-10-30 MED ORDER — LIDOCAINE HCL (PF) 2 % IJ SOLN
INTRAMUSCULAR | Status: AC
Start: 2023-10-30 — End: ?
  Filled 2023-10-30: qty 5

## 2023-10-30 MED ORDER — OXYCODONE HCL 5 MG PO TABS: 5.0000 mg | ORAL_TABLET | Freq: Once | ORAL | Status: DC | PRN

## 2023-10-30 MED ORDER — IBUPROFEN 400 MG PO TABS
600.0000 mg | ORAL_TABLET | Freq: Four times a day (QID) | ORAL | Status: DC
  Administered 2023-10-30 – 2023-11-01 (×7): 600 mg via ORAL
  Filled 2023-10-30 (×7): qty 1

## 2023-10-30 MED ORDER — ONDANSETRON HCL 4 MG PO TABS
4.0000 mg | ORAL_TABLET | Freq: Four times a day (QID) | ORAL | Status: DC | PRN
  Administered 2023-11-01: 4 mg via ORAL
  Filled 2023-10-30: qty 1

## 2023-10-30 MED ORDER — BUPIVACAINE HCL 0.25 % IJ SOLN
INTRAMUSCULAR | Status: AC
  Filled 2023-10-30: qty 1

## 2023-10-30 MED ORDER — HYDROMORPHONE HCL 1 MG/ML IJ SOLN
0.2500 mg | INTRAMUSCULAR | Status: DC | PRN
  Administered 2023-10-30 (×4): 0.5 mg via INTRAVENOUS

## 2023-10-30 MED ORDER — DEXAMETHASONE SODIUM PHOSPHATE 10 MG/ML IJ SOLN
INTRAMUSCULAR | Status: AC
  Filled 2023-10-30: qty 1

## 2023-10-30 MED ORDER — GENTAMICIN SULFATE 40 MG/ML IJ SOLN: 1.5000 mg/kg | Freq: Three times a day (TID) | INTRAVENOUS | Status: DC

## 2023-10-30 MED ORDER — PROPOFOL 10 MG/ML IV BOLUS
INTRAVENOUS | Status: AC
  Filled 2023-10-30: qty 20

## 2023-10-30 MED ORDER — HYDROMORPHONE HCL 1 MG/ML IJ SOLN
INTRAMUSCULAR | Status: AC
  Filled 2023-10-30: qty 1

## 2023-10-30 MED ORDER — FENTANYL CITRATE (PF) 100 MCG/2ML IJ SOLN
INTRAMUSCULAR | Status: AC
  Filled 2023-10-30: qty 2

## 2023-10-30 MED ORDER — LIDOCAINE-EPINEPHRINE 1 %-1:100000 IJ SOLN
INTRAMUSCULAR | Status: AC
  Filled 2023-10-30: qty 2

## 2023-10-30 MED ORDER — MAGNESIUM HYDROXIDE 400 MG/5ML PO SUSP
30.0000 mL | Freq: Every day | ORAL | Status: DC | PRN
  Administered 2023-11-01: 30 mL via ORAL
  Filled 2023-10-30: qty 30

## 2023-10-30 MED ORDER — ONDANSETRON HCL 4 MG/2ML IJ SOLN
INTRAMUSCULAR | Status: AC
  Filled 2023-10-30: qty 2

## 2023-10-30 MED ORDER — HYDROMORPHONE HCL 1 MG/ML IJ SOLN
0.2000 mg | INTRAMUSCULAR | Status: DC | PRN
Start: 2023-10-30 — End: 2023-11-01
  Administered 2023-10-31 – 2023-11-01 (×5): 0.6 mg via INTRAVENOUS
  Filled 2023-10-30 (×5): qty 1

## 2023-10-30 MED ORDER — ROCURONIUM BROMIDE 100 MG/10ML IV SOLN
INTRAVENOUS | Status: DC | PRN
  Administered 2023-10-30: 30 mg via INTRAVENOUS

## 2023-10-30 MED ORDER — POVIDONE-IODINE 10 % EX SWAB: 2.0000 | Freq: Once | CUTANEOUS | Status: DC

## 2023-10-30 MED ORDER — SODIUM CHLORIDE 0.9 % IV SOLN
Freq: Once | INTRAVENOUS | Status: DC
  Filled 2023-10-30 (×2): qty 10

## 2023-10-30 MED ORDER — PHENYLEPHRINE 80 MCG/ML (10ML) SYRINGE FOR IV PUSH (FOR BLOOD PRESSURE SUPPORT)
PREFILLED_SYRINGE | INTRAVENOUS | Status: AC
Start: 2023-10-30 — End: ?
  Filled 2023-10-30: qty 10

## 2023-10-30 MED ORDER — ACETAMINOPHEN 325 MG PO TABS: 650.0000 mg | ORAL_TABLET | ORAL | Status: DC | PRN

## 2023-10-30 MED ORDER — OXYCODONE HCL 5 MG PO TABS
5.0000 mg | ORAL_TABLET | ORAL | Status: DC | PRN
  Administered 2023-10-30 – 2023-11-01 (×7): 10 mg via ORAL
  Filled 2023-10-30 (×7): qty 2

## 2023-10-30 MED ORDER — LACTATED RINGERS IV SOLN: INTRAVENOUS | Status: DC

## 2023-10-30 MED ORDER — OXYCODONE HCL 5 MG/5ML PO SOLN: 5.0000 mg | Freq: Once | ORAL | Status: DC | PRN

## 2023-10-30 MED ORDER — ALBUMIN HUMAN 5 % IV SOLN
INTRAVENOUS | Status: AC
Start: 2023-10-30 — End: ?
  Filled 2023-10-30: qty 250

## 2023-10-30 MED ORDER — ONDANSETRON HCL 4 MG/2ML IJ SOLN: 4.0000 mg | Freq: Once | INTRAMUSCULAR | Status: DC | PRN

## 2023-10-30 MED ORDER — SODIUM CHLORIDE 0.9 % IV SOLN: INTRAVENOUS | Status: DC | PRN

## 2023-10-30 MED ORDER — CLINDAMYCIN PHOSPHATE 900 MG/50ML IV SOLN
900.0000 mg | INTRAVENOUS | Status: AC
  Administered 2023-10-30: 900 mg via INTRAVENOUS
  Filled 2023-10-30: qty 50

## 2023-10-30 MED ORDER — ACETAMINOPHEN 500 MG PO TABS
1000.0000 mg | ORAL_TABLET | ORAL | Status: AC
  Administered 2023-10-30: 1000 mg via ORAL
  Filled 2023-10-30: qty 2

## 2023-10-30 MED ORDER — DOCUSATE SODIUM 100 MG PO CAPS
100.0000 mg | ORAL_CAPSULE | Freq: Two times a day (BID) | ORAL | Status: DC
  Administered 2023-10-30 – 2023-11-01 (×4): 100 mg via ORAL
  Filled 2023-10-30 (×4): qty 1

## 2023-10-30 MED ORDER — GENTAMICIN SULFATE 40 MG/ML IJ SOLN
5.0000 mg/kg | INTRAVENOUS | Status: AC
  Administered 2023-10-30: 333.6 mg via INTRAVENOUS
  Filled 2023-10-30: qty 8.25

## 2023-10-30 MED ORDER — PROPOFOL 10 MG/ML IV BOLUS
INTRAVENOUS | Status: DC | PRN
  Administered 2023-10-30: 150 mg via INTRAVENOUS

## 2023-10-30 MED ORDER — LACTATED RINGERS IV SOLN: INTRAVENOUS | Status: AC

## 2023-10-30 MED ORDER — GENTAMICIN SULFATE 40 MG/ML IJ SOLN
320.0000 mg | Freq: Once | INTRAVENOUS | Status: AC
  Administered 2023-10-31: 320 mg via INTRAVENOUS
  Filled 2023-10-30: qty 8

## 2023-10-30 MED ORDER — MAGNESIUM CITRATE PO SOLN: 1.0000 | Freq: Once | ORAL | Status: DC | PRN

## 2023-10-30 MED ORDER — ALBUMIN HUMAN 5 % IV SOLN: INTRAVENOUS | Status: DC | PRN

## 2023-10-30 MED ORDER — LIDOCAINE HCL (CARDIAC) PF 100 MG/5ML IV SOSY
PREFILLED_SYRINGE | INTRAVENOUS | Status: DC | PRN
  Administered 2023-10-30: 100 mg via INTRAVENOUS

## 2023-10-30 MED ORDER — PHENYLEPHRINE 80 MCG/ML (10ML) SYRINGE FOR IV PUSH (FOR BLOOD PRESSURE SUPPORT)
PREFILLED_SYRINGE | INTRAVENOUS | Status: DC | PRN
  Administered 2023-10-30 (×3): 80 ug via INTRAVENOUS
  Administered 2023-10-30: 160 ug via INTRAVENOUS

## 2023-10-30 MED ORDER — BUPIVACAINE HCL (PF) 0.25 % IJ SOLN
INTRAMUSCULAR | Status: DC | PRN
  Administered 2023-10-30: 10 mL

## 2023-10-30 MED ORDER — CHLORHEXIDINE GLUCONATE 0.12 % MT SOLN
15.0000 mL | Freq: Once | OROMUCOSAL | Status: AC
  Administered 2023-10-30: 15 mL via OROMUCOSAL

## 2023-10-30 MED ORDER — SUCCINYLCHOLINE CHLORIDE 200 MG/10ML IV SOSY
PREFILLED_SYRINGE | INTRAVENOUS | Status: DC | PRN
  Administered 2023-10-30: 120 mg via INTRAVENOUS

## 2023-10-30 MED ORDER — ONDANSETRON HCL 4 MG/2ML IJ SOLN
4.0000 mg | Freq: Four times a day (QID) | INTRAMUSCULAR | Status: DC | PRN
  Administered 2023-10-31 – 2023-11-01 (×3): 4 mg via INTRAVENOUS
  Filled 2023-10-30 (×3): qty 2

## 2023-10-30 MED ORDER — SIMETHICONE 80 MG PO CHEW
80.0000 mg | CHEWABLE_TABLET | Freq: Four times a day (QID) | ORAL | Status: DC | PRN
  Administered 2023-11-01: 80 mg via ORAL
  Filled 2023-10-30: qty 1

## 2023-10-30 MED ORDER — ACETAMINOPHEN 10 MG/ML IV SOLN: 1000.0000 mg | Freq: Once | INTRAVENOUS | Status: DC | PRN

## 2023-10-30 SURGICAL SUPPLY — 56 items
BAG COUNTER SPONGE SURGICOUNT (BAG) IMPLANT
BLADE SURG 15 STRL LF DISP TIS (BLADE) ×2 IMPLANT
BRIEF MESH DISP LRG (UNDERPADS AND DIAPERS) ×2 IMPLANT
CABLE HIGH FREQUENCY MONO STRZ (ELECTRODE) IMPLANT
CHLORAPREP W/TINT 26 (MISCELLANEOUS) ×2 IMPLANT
CNTNR URN SCR LID CUP LEK RST (MISCELLANEOUS) IMPLANT
COVER SURGICAL LIGHT HANDLE (MISCELLANEOUS) ×2 IMPLANT
DERMABOND ADVANCED .7 DNX12 (GAUZE/BANDAGES/DRESSINGS) ×2 IMPLANT
DRAPE SHEET LG 3/4 BI-LAMINATE (DRAPES) ×2 IMPLANT
DRAPE UNDERBUTTOCKS STRL (DISPOSABLE) ×2 IMPLANT
DRSG TELFA 3X8 NADH STRL (GAUZE/BANDAGES/DRESSINGS) ×2 IMPLANT
ELECT REM PT RETURN 15FT ADLT (MISCELLANEOUS) ×2 IMPLANT
GAUZE 4X4 16PLY ~~LOC~~+RFID DBL (SPONGE) ×2 IMPLANT
GLOVE BIO SURGEON STRL SZ 6 (GLOVE) ×4 IMPLANT
GLOVE BIO SURGEON STRL SZ 6.5 (GLOVE) ×4 IMPLANT
GOWN STRL REUS W/ TWL LRG LVL3 (GOWN DISPOSABLE) ×4 IMPLANT
HOLDER FOLEY CATH W/STRAP (MISCELLANEOUS) IMPLANT
IRRIG SUCT STRYKERFLOW 2 WTIP (MISCELLANEOUS)
IRRIGATION SUCT STRKRFLW 2 WTP (MISCELLANEOUS) IMPLANT
KIT BASIN OR (CUSTOM PROCEDURE TRAY) ×2 IMPLANT
KIT TURNOVER KIT A (KITS) IMPLANT
MANIPULATOR UTERINE 4.5 ZUMI (MISCELLANEOUS) IMPLANT
NDL HYPO 25X1 1.5 SAFETY (NEEDLE) ×2 IMPLANT
NEEDLE HYPO 25X1 1.5 SAFETY (NEEDLE) ×1 IMPLANT
NS IRRIG 1000ML POUR BTL (IV SOLUTION) ×2 IMPLANT
PACK LITHOTOMY IV (CUSTOM PROCEDURE TRAY) ×2 IMPLANT
PACK PERINEAL COLD (PAD) ×2 IMPLANT
PAD OB MATERNITY 11 LF (PERSONAL CARE ITEMS) ×2 IMPLANT
PAD POSITIONING PINK XL (MISCELLANEOUS) ×2 IMPLANT
PENCIL SMOKE EVACUATOR (MISCELLANEOUS) IMPLANT
SCISSORS LAP 5X35 DISP (ENDOMECHANICALS) IMPLANT
SCRUB CHG 4% DYNA-HEX 4OZ (MISCELLANEOUS) ×2 IMPLANT
SEALER TISSUE G2 CVD JAW 45CM (ENDOMECHANICALS) IMPLANT
SHEET LAVH (DRAPES) ×2 IMPLANT
SLEEVE Z-THREAD 5X100MM (TROCAR) ×2 IMPLANT
SPIKE FLUID TRANSFER (MISCELLANEOUS) IMPLANT
SURGILUBE 2OZ TUBE FLIPTOP (MISCELLANEOUS) ×2 IMPLANT
SUT MNCRL AB 4-0 PS2 18 (SUTURE) ×4 IMPLANT
SUT VIC AB 0 CT1 18XCR BRD 8 (SUTURE) IMPLANT
SUT VIC AB 0 CT1 27XBRD ANTBC (SUTURE) ×2 IMPLANT
SUT VIC AB 2-0 SH 27X BRD (SUTURE) ×8 IMPLANT
SUT VIC AB 3-0 SH 27X BRD (SUTURE) ×8 IMPLANT
SUT VIC AB 4-0 PS2 27 (SUTURE) ×4 IMPLANT
SUT VIC AB 4-0 SH 27XBRD (SUTURE) ×4 IMPLANT
SYR CONTROL 10ML LL (SYRINGE) ×2 IMPLANT
SYS BAG RETRIEVAL 10MM (BASKET)
SYS RETRIEVAL 5MM INZII UNIV (BASKET)
SYSTEM BAG RETRIEVAL 10MM (BASKET) IMPLANT
SYSTEM RETRIEVL 5MM INZII UNIV (BASKET) IMPLANT
TOWEL OR 17X26 10 PK STRL BLUE (TOWEL DISPOSABLE) ×2 IMPLANT
TRAY FOLEY MTR SLVR 16FR STAT (SET/KITS/TRAYS/PACK) ×2 IMPLANT
TRAY LAPAROSCOPIC (CUSTOM PROCEDURE TRAY) ×2 IMPLANT
TROCAR ADV FIXATION 12X100MM (TROCAR) IMPLANT
TROCAR BALLN 12MMX100 BLUNT (TROCAR) IMPLANT
TROCAR Z-THREAD OPTICAL 5X100M (TROCAR) ×2 IMPLANT
UNDERPAD 30X36 HEAVY ABSORB (UNDERPADS AND DIAPERS) ×4 IMPLANT

## 2023-10-30 NOTE — Transfer of Care (Signed)
Immediate Anesthesia Transfer of Care Note  Patient: Kimberly [name]  Procedure(s) Performed: REPAIR VAGINAL CUFF LAPAROSCOPY DIAGNOSTIC  Patient Location: PACU  Anesthesia Type:General  Level of Consciousness: drowsy and patient cooperative  Airway & Oxygen Therapy: Patient Spontanous Breathing and Patient connected to nasal cannula oxygen  Post-op Assessment: Report given to RN and Post -op Vital signs reviewed and stable  Post vital signs: Reviewed and stable  Last Vitals:  Vitals Value Taken Time  BP 114/90 10/30/23 1835  Temp    Pulse 93 10/30/23 1837  Resp 15 10/30/23 1837  SpO2 100 % 10/30/23 1837  Vitals shown include unfiled device data.  Last Pain:  Vitals:   10/30/23 1609  TempSrc: Oral         Complications: No notable events documented.

## 2023-10-30 NOTE — Progress Notes (Signed)
Pharmacy consulted for gentamicin for surgical prophylaxis x 48 hr in this 24 yoF s/p vaginal cuff repair in OR  Assessment: Patient has already had gentamicin 5 mg/kg IV x 1 immediately prior to OR >> will cover for 24 hr CrCl > 90 Wt 67 kg; BMI < 30  Plan: Will give an additional dose of gentamicin 5 mg/kg IV x 1 tomorrow; will provide 48 hr total coverage of broad-spectrum GNR Will sign off; please re-consult if antibiotic needs change  Bernadene Person, PharmD, BCPS [phone] 10/30/2023, 8:21 PM

## 2023-10-30 NOTE — Anesthesia Procedure Notes (Signed)
Procedure Name: Intubation Date/Time: 10/30/2023 5:14 PM  Performed by: Maurene Capes, CRNAPre-anesthesia Checklist: Patient identified, Emergency Drugs available, Suction available and Patient being monitored Patient Re-evaluated:Patient Re-evaluated prior to induction Oxygen Delivery Method: Circle System Utilized Preoxygenation: Pre-oxygenation with 100% oxygen Induction Type: IV induction, Rapid sequence and Cricoid Pressure applied Ventilation: Mask ventilation without difficulty Laryngoscope Size: Mac and 4 Grade View: Grade II Tube type: Oral Number of attempts: 1 Airway Equipment and Method: Stylet Placement Confirmation: ETT inserted through vocal cords under direct vision, positive ETCO2 and breath sounds checked- equal and bilateral Tube secured with: Tape Dental Injury: Teeth and Oropharynx as per pre-operative assessment

## 2023-10-30 NOTE — Anesthesia Preprocedure Evaluation (Signed)
Anesthesia Evaluation  Patient identified by MRN, date of birth, ID band Patient awake    Reviewed: Allergy & Precautions, H&P , NPO status , Patient's Chart, lab work & pertinent test results  Airway Mallampati: II  TM Distance: >3 FB Neck ROM: Full    Dental no notable dental hx.    Pulmonary Current Smoker   Pulmonary exam normal breath sounds clear to auscultation       Cardiovascular negative cardio ROS Normal cardiovascular exam Rhythm:Regular Rate:Normal     Neuro/Psych negative neurological ROS  negative psych ROS   GI/Hepatic Neg liver ROS,GERD  ,,  Endo/Other  negative endocrine ROS    Renal/GU negative Renal ROS  negative genitourinary   Musculoskeletal negative musculoskeletal ROS (+)    Abdominal   Peds negative pediatric ROS (+)  Hematology negative hematology ROS (+)   Anesthesia Other Findings Alcoholic polyneuropathy resolved since stopping drinking  Reproductive/Obstetrics negative OB ROS                             Anesthesia Physical Anesthesia Plan  ASA: 2 and emergent  Anesthesia Plan: General   Post-op Pain Management:    Induction: Intravenous and Rapid sequence  PONV Risk Score and Plan: 2 and Ondansetron, Dexamethasone and Treatment may vary due to age or medical condition  Airway Management Planned: Oral ETT  Additional Equipment:   Intra-op Plan:   Post-operative Plan: Extubation in OR  Informed Consent: I have reviewed the patients History and Physical, chart, labs and discussed the procedure including the risks, benefits and alternatives for the proposed anesthesia with the patient or authorized representative who has indicated his/her understanding and acceptance.     Dental advisory given  Plan Discussed with: CRNA and Surgeon  Anesthesia Plan Comments:        Anesthesia Quick Evaluation

## 2023-10-30 NOTE — Progress Notes (Signed)
Patient reports having sex with two partners since 6 weeks PO RLH.  She is 9 weeks out from surgery.  Instructed to be on pelvic rest for the full 10 weeks in all prior visits. She reports today she had intercourse and felt a pop, pain, and blood and now clear fluid and it is not urine. Her husband does not know about the person from today that she had intercourse with. Discussed my concern that she has a cuff dehiscence and needs to go the OR immediately and risk for bowel injury and colostomy if not repaired quickly. Patient also notes she has been using vaginal toys as well although instructed to be on pelvic rest Team notified and manager finding an opening asap. She would like to drive. [facility] 5 minutes away.  Discussed laparoscopic and vaginal repair of cuff will likely be needed along with broad spectrum antibiotics. Patient agreed.   NPO since yesterday noon (bowel of cereal) Monster drink on her way here Denies fevers Blood pressure 128/86, pulse 97, height 5' 7.5" (1.715 m), weight 149 lb (67.6 kg), last menstrual period 07/10/2023, SpO2 97%.  SVE: no bowel Cuff separated. Qtip could enter abdomen with more peritoneal fluid. No bleeding   Dr. Karma Greaser

## 2023-10-30 NOTE — Procedures (Addendum)
10/30/2023   [id number]  Kimberly [name]       OPERATIVE REPORT   Preop Diagnosis: vaginal cuff separation  Procedure: vaginal repair of complete cuff separation with diagnostic laparoscopy and irrigation   Surgeon: Dr. Allayne Butcher Assistant: None   Fluids: please see anesthesia report   Complications: None Anesthesia: General     Findings:  5cm complete cuff separation. No bowel was seen in the vagina or attached to the cuff. Serosangenous fluid without pus or obvious infection seen   Estimated blood loss: Minimal <25cc   Specimens: none    PROCEDURE IN DETAIL:  The patient received intravenous antibiotics, Clinda and gent, and had sequential compression devices applied to her lower extremities while in the preoperative area.  She was then taken to the operating room where general anesthesia was administered and was found to be adequate. She was prepped and draped in proper sterile fashion.  After an adequate timeout was performed, and everyone agreed, attention was then turned to the vagina and a foley catheter was inserted into her bladder and attached to constant drainage.  A vaginal exam was performed and the cuff was noted to be completely open and the bowel was not involved.  She was then placed in trendelenburg position.  The cuff was grasped with two alice clamps and interrupted figure of eight sutures were used with zero vicryl to close the cuff.  Approximately 10 sutures were used with adequate closure. The vagina was wiped with iodine.   Attention was then turned to the patient's abdomen where a 5mm skin incision was made in the supra umbilical region.  The laparoscopic sleeve and trocar were carefully introduced into the peritoneal cavity and intraabdominal entry was confirmed on visualization.  Intraperitoneal placement was confirmed by low opening intraabdominal pressure with insufflation of carbon dioxide gas.  Adequate pneumoperitoneum was obtained  and the upper abdomen surveyed. The patient was then  placed in steep trendelenburg position. A second 5mm port was placed in the right lower quadrant under direct visualization.  A survey of the patient's pelvis and abdomen revealed normal ovaries and adequate cuff closure with the vaginal repair.   Bowel appeared normal,  The operative site was surveyed, and it was found to be hemostatic.    The abdomen was irrigated with copious amount of fluid with ancef. Good hemostasis was assured. The abdomen was desufflated and all trocars were then removed from the patient's abdomen under direct visualization.  All skin incisions were closed with 4-0 monocryl with a subcuticular stitches/Dermabond. .  Patient was taken to recover in stable condition.  Earley Favor

## 2023-10-30 NOTE — H&P (Signed)
H&P PreOp   37 y.o. y.o. female here for vaginal cuff separation repair.  Patient reports having sex with two partners since 6 weeks PO RLH.  She is 9 weeks out from surgery.  Instructed to be on pelvic rest for the full 10 weeks in all prior visits. She reports today she had intercourse and felt a pop, pain, and blood and now clear fluid and it is not urine. Her husband does not know about the person from today that she had intercourse with. Discussed my concern that she has a cuff dehiscence and needs to go the OR immediately and risk for bowel injury and colostomy if not repaired quickly. Patient also notes she has been using vaginal toys as well although instructed to be on pelvic rest Team notified and manager finding an opening asap. She would like to drive. [facility] 5 minutes away.  Discussed laparoscopic and vaginal repair of cuff will likely be needed along with broad spectrum antibiotics. Patient agreed.   NPO since yesterday noon (bowel of cereal) Monster drink on her way here Denies fevers Blood pressure 128/86, pulse 97, height 5' 7.5" (1.715 m), weight 149 lb (67.6 kg), last menstrual period 07/10/2023, SpO2 97%.   SVE: no bowel Cuff separated. Qtip could enter abdomen with more peritoneal fluid. No bleeding. Patient's last menstrual period was 07/10/2023 (approximate).     There is no height or weight on file to calculate BMI.     09/19/2023    3:11 PM 04/18/2023    9:22 AM 10/04/2022    9:22 AM  Depression screen PHQ 2/9  Decreased Interest 0 1 1  Down, Depressed, Hopeless 0 1 1  PHQ - 2 Score 0 2 2  Altered sleeping 0 0 0  Tired, decreased energy 0 1 1  Change in appetite 0 0 0  Feeling bad or failure about yourself  0 2 1  Trouble concentrating 0 0 2  Moving slowly or fidgety/restless 0 0 0  Suicidal thoughts 0 0 0  PHQ-9 Score 0 5 6    Last menstrual period 07/10/2023.     Component Value Date/Time   DIAGPAP  08/17/2023 1620    - Negative for  Intraepithelial Lesions or Malignancy (NILM)   DIAGPAP - Benign reactive/reparative changes 08/17/2023 1620   HPVHIGH Negative 08/17/2023 1620   ADEQPAP  08/17/2023 1620    Satisfactory for evaluation; transformation zone component PRESENT.    GYN HISTORY:    Component Value Date/Time   DIAGPAP  08/17/2023 1620    - Negative for Intraepithelial Lesions or Malignancy (NILM)   DIAGPAP - Benign reactive/reparative changes 08/17/2023 1620   HPVHIGH Negative 08/17/2023 1620   ADEQPAP  08/17/2023 1620    Satisfactory for evaluation; transformation zone component PRESENT.    OB History  Gravida Para Term Preterm AB Living  1 1 0 1 0 2  SAB IAB Ectopic Multiple Live Births  0 0 0 1 2    # Outcome Date GA Lbr Len/2nd Weight Sex Type Anes PTL Lv  1A Preterm 09/13/12 [redacted]w[redacted]d  2390 g M CS-LTranv Spinal  LIV  1B Preterm 09/13/12 [redacted]w[redacted]d  1551 g M CS-LVertical Spinal  LIV    Past Medical History:  Diagnosis Date   Abnormal Pap smear    ADD (attention deficit disorder)    Follows w/ PCP and Behavioral Health.   Alcohol use disorder    Quit drinking 12/30/2021, follows w/ Behavioral Health.   Alcoholic polyneuropathy (HCC)    hx  of, now improved, no longer drinking alcohol   Anxiety    Follows w/ Behavioral Health & PCP.   Depression    Follows w/ PCP and therapist.   GERD (gastroesophageal reflux disease)    takes Protonix bid   Guillain Barr syndrome (HCC) 2020   residual symptoms of feet being numb up to mid calf and left-hand numbness. Follows w/ PCP, Dr. Sherwood Gambler @ [facility] in [place]. Patient ambulates without a cane or walker[place].   HSV-1 infection    Hyperglycemia 12/06/2018   IBS (irritable bowel syndrome)    Knee hyperextension injury 2020   Left, being treated by ortho   Pneumonia 08/2022   Covid pneumonia   Tachycardia    Wears contact lenses    Wears glasses     Past Surgical History:  Procedure Laterality Date   BREAST BIOPSY Right     benign   CESAREAN SECTION  09/13/2012   Procedure: CESAREAN SECTION;  Surgeon: Willodean Rosenthal, MD;  Location: [facility];  Service: Obstetrics;  Laterality: N/A;   cortisone shot R wrist Right 2020   CYSTOSCOPY N/A 09/01/2023   Procedure: CYSTOSCOPY;  Surgeon: Earley Favor, MD;  Location: [facility];  Service: Gynecology;  Laterality: N/A;   KYPHOPLASTY N/A 08/27/2013   Procedure: Thoracic Twelve Kyphoplasty;  Surgeon: Maeola Harman, MD;  Location: [facility];  Service: Neurosurgery;  Laterality: N/A;  T12 Kyphoplasty   LAPAROSCOPIC TUBAL LIGATION Bilateral 10/21/2019   Procedure: LAPAROSCOPIC TUBAL LIGATION WITH CAUTERY;  Surgeon: Genia Del, MD;  Location: [facility];  Service: Gynecology;  Laterality: Bilateral;  Request to follow 2nd case in [place] [facility] block requests one hour   ROBOTIC ASSISTED LAPAROSCOPIC HYSTERECTOMY AND SALPINGECTOMY Bilateral 09/01/2023   Procedure: XI ROBOTIC ASSISTED LAPAROSCOPIC HYSTERECTOMY AND SALPINGECTOMY;  Surgeon: Earley Favor, MD;  Location: [facility];  Service: Gynecology;  Laterality: Bilateral;   WISDOM TOOTH EXTRACTION     x4   WRIST SURGERY Right 07/23/2013    No current facility-administered medications on file prior to encounter.   Current Outpatient Medications on File Prior to Encounter  Medication Sig Dispense Refill   ALPRAZolam (XANAX) 0.5 MG tablet Take 0.5 mg by mouth 4 (four) times daily as needed.     ibuprofen (ADVIL) 800 MG tablet Take 1 tablet (800 mg total) by mouth every 8 (eight) hours as needed. 30 tablet 1   pantoprazole (PROTONIX) 40 MG tablet Take 1 tablet (40 mg total) by mouth daily. (Patient taking differently: Take 40 mg by mouth 2 (two) times daily.) 30 tablet 1   pregabalin (LYRICA) 200 MG capsule TAKE 1 CAPSULE (200 MG TOTAL) BY MOUTH 3 (THREE) TIMES DAILY. 90 capsule 2   Semaglutide,0.25 or 0.5MG /DOS, 2 MG/1.5ML SOPN       Social History    Socioeconomic History   Marital status: Married    Spouse name: Not on file   Number of children: 2   Years of education: Not on file   Highest education level: Not on file  Occupational History   Occupation: In home health   Tobacco Use   Smoking status: Every Day    Types: Cigarettes   Smokeless tobacco: Never   Tobacco comments:    Smokes 1/2 pack per day. Started in 1997.  Vaping Use   Vaping status: Every Day   Start date: 11/07/2018   Substances: Nicotine, Flavoring  Substance and Sexual Activity   Alcohol use: Not Currently  Comment: No alcohol since 2023   Drug use: Never   Sexual activity: Not Currently    Partners: Male    Birth control/protection: Surgical    Comment: 1st intercourse 37 yo-More than 5 partners-BTL, hysterctomy  Other Topics Concern   Not on file  Social History Narrative   Lives with husband and twin sons and stepson.   Social Drivers of Corporate investment banker Strain: Not on file  Food Insecurity: Not on file  Transportation Needs: Not on file  Physical Activity: Not on file  Stress: Not on file  Social Connections: Unknown (11/10/2022)   [facility] from [facility], [facility]   Social Network    Social Network: Not on file  Intimate Partner Violence: Unknown (11/10/2022)   Received from [employer], [facility]   HITS    Physically Hurt: Not on file    Insult or Talk Down To: Not on file    Threaten Physical Harm: Not on file    Scream or Curse: Not on file    Family History  Problem Relation Age of Onset   Breast cancer Mother    Cancer Mother        Pancreatic cancer   Depression Mother        chronic   Hearing loss Mother    Hyperlipidemia Mother    Vision loss Mother        beachets disease   COPD Father    Diabetes Father    Cancer Maternal Grandmother 39       breast cancer   Parkinsonism Maternal Grandmother    Heart disease Maternal Grandmother    Breast cancer Maternal Grandmother    Heart disease  Maternal Grandfather    Alzheimer's disease Paternal Grandmother    Heart disease Paternal Grandfather        Died age 23   Nephrolithiasis Paternal Grandfather    Cancer Other 60       breast   Breast cancer Other      Allergies  Allergen Reactions   Wellbutrin [Bupropion] Other (See Comments)    Severe Headaches   Tape Itching and Other (See Comments)    Clear, "plastic" tape is the culprit      Patient's last menstrual period was Patient's last menstrual period was 07/10/2023 (approximate)..           Review of Systems Alls systems reviewed and are negative.     S1S2 CTA B/L Soft, NT    A:         vaginal cuff separation PO 9 wks RLH, B/L salpingectomy, cystoscopy for vaginal and laparoscopic repair and antibiotics                             P:          Discussed need for emergent repair in the OR and for antibiotics. The procedure was discussed in detail. Patient agreed. Consent to be done and signed at [facility].  No follow-ups on file.  Earley Favor

## 2023-10-31 ENCOUNTER — Encounter (HOSPITAL_COMMUNITY): Payer: Self-pay | Admitting: Obstetrics and Gynecology

## 2023-10-31 LAB — CBC WITH DIFFERENTIAL/PLATELET
Abs Immature Granulocytes: 0.06 10*3/uL (ref 0.00–0.07)
Basophils Absolute: 0.1 10*3/uL (ref 0.0–0.1)
Basophils Relative: 0 %
Eosinophils Absolute: 0.2 10*3/uL (ref 0.0–0.5)
Eosinophils Relative: 1 %
HCT: 38.8 % (ref 36.0–46.0)
Hemoglobin: 12.3 g/dL (ref 12.0–15.0)
Immature Granulocytes: 0 %
Lymphocytes Relative: 14 %
Lymphs Abs: 2.1 10*3/uL (ref 0.7–4.0)
MCH: 29.1 pg (ref 26.0–34.0)
MCHC: 31.7 g/dL (ref 30.0–36.0)
MCV: 91.7 fL (ref 80.0–100.0)
Monocytes Absolute: 0.8 10*3/uL (ref 0.1–1.0)
Monocytes Relative: 5 %
Neutro Abs: 11.5 10*3/uL — ABNORMAL HIGH (ref 1.7–7.7)
Neutrophils Relative %: 80 %
Platelets: 239 10*3/uL (ref 150–400)
RBC: 4.23 MIL/uL (ref 3.87–5.11)
RDW: 14.5 % (ref 11.5–15.5)
WBC: 14.6 10*3/uL — ABNORMAL HIGH (ref 4.0–10.5)
nRBC: 0 % (ref 0.0–0.2)

## 2023-10-31 LAB — COMPREHENSIVE METABOLIC PANEL
ALT: 14 U/L (ref 0–44)
AST: 13 U/L — ABNORMAL LOW (ref 15–41)
Albumin: 3.3 g/dL — ABNORMAL LOW (ref 3.5–5.0)
Alkaline Phosphatase: 43 U/L (ref 38–126)
Anion gap: 8 (ref 5–15)
BUN: 8 mg/dL (ref 6–20)
CO2: 26 mmol/L (ref 22–32)
Calcium: 8.4 mg/dL — ABNORMAL LOW (ref 8.9–10.3)
Chloride: 103 mmol/L (ref 98–111)
Creatinine, Ser: 0.35 mg/dL — ABNORMAL LOW (ref 0.44–1.00)
GFR, Estimated: >60 mL/min (ref >60)
Glucose, Bld: 90 mg/dL (ref 70–99)
Potassium: 3.9 mmol/L (ref 3.5–5.1)
Sodium: 137 mmol/L (ref 135–145)
Total Bilirubin: 0.7 mg/dL (ref <1.2)
Total Protein: 5.6 g/dL — ABNORMAL LOW (ref 6.5–8.1)

## 2023-10-31 MED ORDER — BOOST / RESOURCE BREEZE PO LIQD CUSTOM
1.0000 | Freq: Three times a day (TID) | ORAL | Status: DC
Start: 2023-10-31 — End: 2023-11-01
  Administered 2023-10-31 – 2023-11-01 (×4): 1 via ORAL

## 2023-10-31 MED ORDER — PANTOPRAZOLE SODIUM 40 MG PO TBEC
40.0000 mg | DELAYED_RELEASE_TABLET | Freq: Every day | ORAL | Status: DC
Start: 2023-10-31 — End: 2023-11-01
  Administered 2023-10-31: 40 mg via ORAL
  Filled 2023-10-31: qty 1

## 2023-10-31 NOTE — Progress Notes (Addendum)
GYN PO NOTE 10/31/2023   Patient is doing well. No VB. Nausea last night.  Had french toast this Am that has stayed down. Reoprts abdominal pain. Voiding and ambulating. No flatus. No BM for 2 weeks, but states this is normal for her.  To try an enema today.  Blood pressure 107/74, pulse (!) 106, temperature 98.2 F (36.8 C), temperature source Oral, resp. rate 17, height 5' 7.5" (1.715 m), weight 66.7 kg, last menstrual period 07/10/2023, SpO2 98%.   Abdomen: soft, incisions dry with no erythema, approp tender No resp distress, + BS  CBC    Component Value Date/Time   WBC 14.6 (H) 10/31/2023 0435   RBC 4.23 10/31/2023 0435   HGB 12.3 10/31/2023 0435   HCT 38.8 10/31/2023 0435   HCT 39.5 11/16/2018 1922   PLT 239 10/31/2023 0435   MCV 91.7 10/31/2023 0435   MCH 29.1 10/31/2023 0435   MCHC 31.7 10/31/2023 0435   RDW 14.5 10/31/2023 0435   LYMPHSABS 2.1 10/31/2023 0435   MONOABS 0.8 10/31/2023 0435   EOSABS 0.2 10/31/2023 0435   BASOSABS 0.1 10/31/2023 0435        A/p POD#1 with vaginal cuff separation with repair and diagnostic laparoscopy, 9 wks PO RLH   Encouraged increased ambulation Enema today for BM Continue clinda and gent. White count improbed from 18 to 14 today.  Repeat again tomorrow. Will need at least 2 days of antibiotics before starting oral antibiotics. If no flatus by later today and with any persistent vomiting, to get abdominal xray to evaluate for ileus or obstruction. Boost supplement to increase protein for healing Dr. Karma Greaser

## 2023-11-01 LAB — COMPREHENSIVE METABOLIC PANEL
ALT: 13 U/L (ref 0–44)
AST: 14 U/L — ABNORMAL LOW (ref 15–41)
Albumin: 3.6 g/dL (ref 3.5–5.0)
Alkaline Phosphatase: 45 U/L (ref 38–126)
Anion gap: 8 (ref 5–15)
BUN: 11 mg/dL (ref 6–20)
CO2: 28 mmol/L (ref 22–32)
Calcium: 8.8 mg/dL — ABNORMAL LOW (ref 8.9–10.3)
Chloride: 102 mmol/L (ref 98–111)
Creatinine, Ser: 0.79 mg/dL (ref 0.44–1.00)
GFR, Estimated: >60 mL/min (ref >60)
Glucose, Bld: 84 mg/dL (ref 70–99)
Potassium: 4.6 mmol/L (ref 3.5–5.1)
Sodium: 138 mmol/L (ref 135–145)
Total Bilirubin: 0.7 mg/dL (ref <1.2)
Total Protein: 6.1 g/dL — ABNORMAL LOW (ref 6.5–8.1)

## 2023-11-01 LAB — CBC WITH DIFFERENTIAL/PLATELET
Abs Immature Granulocytes: 0.02 10*3/uL (ref 0.00–0.07)
Basophils Absolute: 0.1 10*3/uL (ref 0.0–0.1)
Basophils Relative: 1 %
Eosinophils Absolute: 0.3 10*3/uL (ref 0.0–0.5)
Eosinophils Relative: 3 %
HCT: 38 % (ref 36.0–46.0)
Hemoglobin: 12 g/dL (ref 12.0–15.0)
Immature Granulocytes: 0 %
Lymphocytes Relative: 29 %
Lymphs Abs: 2.2 10*3/uL (ref 0.7–4.0)
MCH: 29.1 pg (ref 26.0–34.0)
MCHC: 31.6 g/dL (ref 30.0–36.0)
MCV: 92.2 fL (ref 80.0–100.0)
Monocytes Absolute: 0.6 10*3/uL (ref 0.1–1.0)
Monocytes Relative: 8 %
Neutro Abs: 4.6 10*3/uL (ref 1.7–7.7)
Neutrophils Relative %: 59 %
Platelets: 235 10*3/uL (ref 150–400)
RBC: 4.12 MIL/uL (ref 3.87–5.11)
RDW: 14.5 % (ref 11.5–15.5)
WBC: 7.7 10*3/uL (ref 4.0–10.5)
nRBC: 0 % (ref 0.0–0.2)

## 2023-11-01 MED ORDER — AMOXICILLIN-POT CLAVULANATE 500-125 MG PO TABS: 1.0000 | ORAL_TABLET | Freq: Three times a day (TID) | ORAL | 0 refills | Status: AC

## 2023-11-01 MED ORDER — FLUCONAZOLE 150 MG PO TABS: 150.0000 mg | ORAL_TABLET | Freq: Once | ORAL | 2 refills | Status: AC

## 2023-11-01 NOTE — Progress Notes (Signed)
Discharge instructions reviewed with patient and spouse. All questions answered. All belongings accounted for. Patient to follow up with MD per discharge instructions. Patient medications sent to pharmacy [facility]. PIV removed. Assisted via WC to private vehicle.

## 2023-11-01 NOTE — Discharge Instructions (Signed)
Follow with me in clinic on January 3rd. Return immediately with any heavy bleeding, discharge or fevers.

## 2023-11-01 NOTE — Progress Notes (Signed)
OBGYN NOTE  Patient is doing well. No VB, nausea or vomiting.  Reports having a small BM. Feeling better today.  Blood pressure 101/62, pulse 83, temperature 98.1 F (36.7 C), temperature source Oral, resp. rate 17, height 5' 7.5" (1.715 m), weight 66.7 kg, last menstrual period 07/10/2023, SpO2 97%.  CBC    Component Value Date/Time   WBC 7.7 11/01/2023 0427   RBC 4.12 11/01/2023 0427   HGB 12.0 11/01/2023 0427   HCT 38.0 11/01/2023 0427   HCT 39.5 11/16/2018 1922   PLT 235 11/01/2023 0427   MCV 92.2 11/01/2023 0427   MCH 29.1 11/01/2023 0427   MCHC 31.6 11/01/2023 0427   RDW 14.5 11/01/2023 0427   LYMPHSABS 2.2 11/01/2023 0427   MONOABS 0.6 11/01/2023 0427   EOSABS 0.3 11/01/2023 0427   BASOSABS 0.1 11/01/2023 0427    CTA B/L Soft, NT + BS No c/c/e  A/p POD#2 s/p vaginal cuff repair doing better  Afebrile with normal white count.  To finish last round of IV antibiotics and discharge to home today.  She will begin Augment tomorrow and see me in clinic on the 3rd.  Patient given very clear instructions to stay on pelvic rest until she is cleared and to return with any abnormal vaginal discharge, bleeding or fevers.  She agreed.  She will need to avoid heavy lifting for the next 8 weeks as well.   Dr. Karma Greaser

## 2023-11-01 NOTE — Discharge Summary (Signed)
Patient presented 9 weeks out from [facility] after intercourse with cuff separation for repair and was noted to have high white count.  She underwent a vaginal cuff repair with diagnostic laparoscopy.  She was also given two days of IV antibiotics, Clinda and gent and remained afebrile during her stay.  She had a normal white count on discharge, and went from 18 to 7.  She was discharged to home with augmentin for 10 days and with precautions on when to return and with strict pelvic rest for an additional 13 weeks, at least.  She was discharged in stable condition. Dr. Karma Greaser

## 2023-11-02 ENCOUNTER — Encounter: Payer: Self-pay | Admitting: Obstetrics and Gynecology

## 2023-11-02 DIAGNOSIS — G8918 Other acute postprocedural pain: Secondary | ICD-10-CM

## 2023-11-02 MED ORDER — DOCUSATE SODIUM 100 MG PO CAPS: 100.0000 mg | ORAL_CAPSULE | Freq: Two times a day (BID) | ORAL | 0 refills | Status: AC

## 2023-11-02 MED ORDER — OXYCODONE HCL 5 MG PO TABS: ORAL_TABLET | ORAL | 0 refills | Status: DC

## 2023-11-02 NOTE — Anesthesia Postprocedure Evaluation (Signed)
Anesthesia Post Note  Patient: Kimberly [name]  Procedure(s) Performed: REPAIR VAGINAL CUFF LAPAROSCOPY DIAGNOSTIC     Patient location during evaluation: PACU Anesthesia Type: General Level of consciousness: awake and alert Pain management: pain level controlled Vital Signs Assessment: post-procedure vital signs reviewed and stable Respiratory status: spontaneous breathing, nonlabored ventilation, respiratory function stable and patient connected to nasal cannula oxygen Cardiovascular status: blood pressure returned to baseline and stable Postop Assessment: no apparent nausea or vomiting Anesthetic complications: no  No notable events documented.  Last Vitals:  Vitals:   10/31/23 2037 11/01/23 0504  BP: 107/76 101/62  Pulse: 81 83  Resp: 16 17  Temp: 36.8 C 36.7 C  SpO2: 100% 97%    Last Pain:  Vitals:   11/01/23 1536  TempSrc:   PainSc: 7                  Leeanne Butters S

## 2023-11-02 NOTE — Telephone Encounter (Signed)
Attempted to call patient to address symptoms.  However patient did not answer phone. Pt Postop day 3 from vaginal cuff dehiscence repair. Pt was requiring oxycodone up until discharge yesterday. Oxycodone taper sent to patient's pharmacy. Stool softener also sent given risk for ileus and additional use of narcotics.  Triage to contact patient to determine if patient is passing gas and has had a bowel movement since discharge.  Please also inquire about nausea or vomiting.

## 2023-11-03 NOTE — Telephone Encounter (Signed)
LVMTCB

## 2023-11-09 ENCOUNTER — Encounter: Payer: Self-pay | Admitting: Nurse Practitioner

## 2023-11-09 ENCOUNTER — Telehealth: Payer: Self-pay

## 2023-11-09 ENCOUNTER — Ambulatory Visit: Payer: BC Managed Care – PPO | Admitting: Nurse Practitioner

## 2023-11-09 ENCOUNTER — Encounter: Payer: BC Managed Care – PPO | Admitting: Obstetrics and Gynecology

## 2023-11-09 VITALS — BP 108/64 | HR 85 | Resp 16

## 2023-11-09 DIAGNOSIS — N6310 Unspecified lump in the right breast, unspecified quadrant: Secondary | ICD-10-CM

## 2023-11-09 DIAGNOSIS — N644 Mastodynia: Secondary | ICD-10-CM

## 2023-11-09 DIAGNOSIS — N6452 Nipple discharge: Secondary | ICD-10-CM | POA: Diagnosis not present

## 2023-11-09 DIAGNOSIS — Z9189 Other specified personal risk factors, not elsewhere classified: Secondary | ICD-10-CM

## 2023-11-09 NOTE — Telephone Encounter (Signed)
 Msg sent to pt via mychart re: preferred breast imaging location.

## 2023-11-09 NOTE — Telephone Encounter (Signed)
-----   Message from Kimberly [name] Shutter sent at 11/09/2023  2:13 PM EST ----- Regarding: Diagnostic breast imaging Please send referral for diagnostic imaging of right breast for lump, pain and nipple discharge. High risk for breast cancer. Due for screening mammogram.

## 2023-11-09 NOTE — Progress Notes (Signed)
   Acute Office Visit  Subjective:    Patient ID: Kimberly [name], female    DOB: [birth year], 38 y.o.   MRN: [id number]   HPI 38 y.o. presents today for right breast lump. Thinks she first noticed it a couple of months ago. Unchanged. Has noticed clear nipple discharge, sometimes with expression and sometimes spontaneously. Denies redness, swelling, skin changes. Lifetime breast cancer risk of 30.7%. Receives annual breast MRIs in conjunction with mammograms. Most recent - 10/10/2023 MRI normal. Did not have her mammogram this year. Genetic testing recommended last year.   Patient's last menstrual period was 07/10/2023 (approximate).    Review of Systems  Constitutional: Negative.   Hematological:  Negative for adenopathy.  Right breast: Positive for lump, pain, nipple discharge. Negative for redness, swelling, skin changes.  Left breast: Negative.      Objective:    Physical Exam Constitutional:      Appearance: Normal appearance.  Chest:  Breasts:    Right: Mass (Appears fibrocystic) present. No swelling, bleeding, inverted nipple, nipple discharge (None with expression), skin change or tenderness.     Left: Normal.     BP 108/64   Pulse 85   Resp 16   LMP 07/10/2023 (Approximate)   SpO2 99%  Wt Readings from Last 3 Encounters:  10/30/23 147 lb (66.7 kg)  10/30/23 149 lb (67.6 kg)  09/01/23 149 lb 12.8 oz (67.9 kg)        Patient informed chaperone available to be present for breast and/or pelvic exam. Patient has requested no chaperone to be present. Patient has been advised what will be completed during breast and pelvic exam.   Assessment & Plan:   Problem List Items Addressed This Visit   None Visit Diagnoses       Nipple discharge    -  Primary   Relevant Orders   Prolactin     Painful lumpy right breast         At high risk for breast cancer          Plan: Will send referral for diagnostic imaging. Appears fibrocystic in nature but patient has  increased risk for breast cancer. Overdue for screening. Has post op appt tomorrow - will have prolactin checked at that time in case other labs are ordered.     Kimberly [name] Shutter DNP, 2:13 PM 11/09/2023

## 2023-11-10 ENCOUNTER — Ambulatory Visit (INDEPENDENT_AMBULATORY_CARE_PROVIDER_SITE_OTHER): Payer: BC Managed Care – PPO | Admitting: Obstetrics and Gynecology

## 2023-11-10 ENCOUNTER — Encounter: Payer: Self-pay | Admitting: Obstetrics and Gynecology

## 2023-11-10 VITALS — BP 134/78 | HR 119 | Ht 67.5 in | Wt 147.0 lb

## 2023-11-10 DIAGNOSIS — Z09 Encounter for follow-up examination after completed treatment for conditions other than malignant neoplasm: Secondary | ICD-10-CM | POA: Diagnosis not present

## 2023-11-10 DIAGNOSIS — E519 Thiamine deficiency, unspecified: Secondary | ICD-10-CM | POA: Diagnosis not present

## 2023-11-10 DIAGNOSIS — N6452 Nipple discharge: Secondary | ICD-10-CM

## 2023-11-10 DIAGNOSIS — N898 Other specified noninflammatory disorders of vagina: Secondary | ICD-10-CM

## 2023-11-10 DIAGNOSIS — N644 Mastodynia: Secondary | ICD-10-CM

## 2023-11-10 DIAGNOSIS — E538 Deficiency of other specified B group vitamins: Secondary | ICD-10-CM | POA: Diagnosis not present

## 2023-11-10 DIAGNOSIS — R739 Hyperglycemia, unspecified: Secondary | ICD-10-CM | POA: Diagnosis not present

## 2023-11-10 MED ORDER — ESTRADIOL 0.1 MG/GM VA CREA: 1.0000 | TOPICAL_CREAM | Freq: Every day | VAGINAL | 12 refills | Status: DC

## 2023-11-10 MED ORDER — CYCLOBENZAPRINE HCL 5 MG PO TABS: 5.0000 mg | ORAL_TABLET | Freq: Three times a day (TID) | ORAL | 0 refills | Status: DC | PRN

## 2023-11-10 NOTE — Progress Notes (Signed)
 10 wk PO with PO on 12/23 for vaginal cuff repair. Doing well.  No VB. Reports 4/10 pelvic pain.  Would like something else besides oxycodone  for pain. Taking her antibiotic and reports she is staying consistent with pelvic rest. No fevers. Recent breast pain and galactorrhea. No headaches or changes in vision.  Blood pressure 134/78, pulse (!) 119, height 5' 7.5 (1.715 m), weight 147 lb (66.7 kg), last menstrual period 07/10/2023, SpO2 97%.   SVE: cuff intact. Qtip with mild palpation.  Possible BV or yeast infection. Culture sent No bleeding.  Sutures seen  A/p PO RLH with cuff revision after early intercourse  Counseled on strict pelvic rest 13 weeks Nuswab sent.  To begin vaginal estrace  Rtc in 2 weeks or sooner with any concerns   Dr. Glennon

## 2023-11-11 ENCOUNTER — Other Ambulatory Visit (HOSPITAL_COMMUNITY): Payer: Self-pay | Admitting: Medical

## 2023-11-11 LAB — URINALYSIS
Bilirubin Urine: NEGATIVE
Glucose, UA: NEGATIVE
Ketones, ur: NEGATIVE
Nitrite: NEGATIVE
Specific Gravity, Urine: 1.024 (ref 1.001–1.035)
pH: 5.5 (ref 5.0–8.0)

## 2023-11-11 LAB — CBC
HCT: 45 % (ref 35.0–45.0)
Hemoglobin: 14.6 g/dL (ref 11.7–15.5)
MCH: 29.6 pg (ref 27.0–33.0)
MCHC: 32.4 g/dL (ref 32.0–36.0)
MCV: 91.1 fL (ref 80.0–100.0)
MPV: 11.3 fL (ref 7.5–12.5)
Platelets: 295 10*3/uL (ref 140–400)
RBC: 4.94 10*6/uL (ref 3.80–5.10)
RDW: 13.6 % (ref 11.0–15.0)
WBC: 13.8 10*3/uL — ABNORMAL HIGH (ref 3.8–10.8)

## 2023-11-11 LAB — SURESWAB® ADVANCED VAGINITIS PLUS,TMA
C. trachomatis RNA, TMA: NOT DETECTED
CANDIDA SPECIES: NOT DETECTED
Candida glabrata: NOT DETECTED
N. gonorrhoeae RNA, TMA: NOT DETECTED
SURESWAB(R) ADV BACTERIAL VAGINOSIS(BV),TMA: NEGATIVE
TRICHOMONAS VAGINALIS (TV),TMA: NOT DETECTED

## 2023-11-11 LAB — PROLACTIN: Prolactin: 8.2 ng/mL

## 2023-11-11 LAB — TSH: TSH: 1.07 m[IU]/L

## 2023-11-13 NOTE — Telephone Encounter (Signed)
 Per review of EPIC, patient is scheduled for breast imaging on 11/20/23.   Encounter closed.

## 2023-11-16 ENCOUNTER — Other Ambulatory Visit: Payer: BC Managed Care – PPO

## 2023-11-16 ENCOUNTER — Encounter: Payer: Self-pay | Admitting: Obstetrics and Gynecology

## 2023-11-16 DIAGNOSIS — G8918 Other acute postprocedural pain: Secondary | ICD-10-CM | POA: Diagnosis not present

## 2023-11-16 DIAGNOSIS — E538 Deficiency of other specified B group vitamins: Secondary | ICD-10-CM | POA: Diagnosis not present

## 2023-11-16 DIAGNOSIS — N390 Urinary tract infection, site not specified: Secondary | ICD-10-CM | POA: Diagnosis not present

## 2023-11-16 NOTE — Telephone Encounter (Signed)
 Reviewed with Dr. Karma Greaser, no CBC needed.   Spoke with patient, lab appt scheduled for today at 2pm.   Order placed.   Encounter closed.

## 2023-11-16 NOTE — Telephone Encounter (Signed)
 Pt no longer on this service PCP is designated prescriber

## 2023-11-16 NOTE — Telephone Encounter (Signed)
 Dr. Karma Greaser -please review. I do not see that a urine culture was ordered.

## 2023-11-17 LAB — URINE CULTURE
MICRO NUMBER:: 15937926
SPECIMEN QUALITY:: ADEQUATE

## 2023-11-20 ENCOUNTER — Other Ambulatory Visit: Payer: Self-pay | Admitting: Nurse Practitioner

## 2023-11-20 ENCOUNTER — Ambulatory Visit
Admission: RE | Admit: 2023-11-20 | Discharge: 2023-11-20 | Disposition: A | Discharge status: Home / Self Care | Payer: BC Managed Care – PPO | Source: Ambulatory Visit | Attending: Obstetrics and Gynecology | Admitting: Obstetrics and Gynecology

## 2023-11-20 DIAGNOSIS — N6310 Unspecified lump in the right breast, unspecified quadrant: Secondary | ICD-10-CM

## 2023-11-20 DIAGNOSIS — N6452 Nipple discharge: Secondary | ICD-10-CM

## 2023-11-20 DIAGNOSIS — N644 Mastodynia: Secondary | ICD-10-CM

## 2023-11-20 DIAGNOSIS — Z9189 Other specified personal risk factors, not elsewhere classified: Secondary | ICD-10-CM

## 2023-11-22 DIAGNOSIS — F411 Generalized anxiety disorder: Secondary | ICD-10-CM | POA: Diagnosis not present

## 2023-11-22 DIAGNOSIS — F4310 Post-traumatic stress disorder, unspecified: Secondary | ICD-10-CM | POA: Diagnosis not present

## 2023-11-22 DIAGNOSIS — F1010 Alcohol abuse, uncomplicated: Secondary | ICD-10-CM | POA: Diagnosis not present

## 2023-11-22 DIAGNOSIS — F321 Major depressive disorder, single episode, moderate: Secondary | ICD-10-CM | POA: Diagnosis not present

## 2023-11-27 ENCOUNTER — Ambulatory Visit (INDEPENDENT_AMBULATORY_CARE_PROVIDER_SITE_OTHER): Payer: BC Managed Care – PPO | Admitting: Obstetrics and Gynecology

## 2023-11-27 ENCOUNTER — Encounter: Payer: Self-pay | Admitting: Obstetrics and Gynecology

## 2023-11-27 VITALS — BP 104/72 | Temp 107.0°F

## 2023-11-27 DIAGNOSIS — N898 Other specified noninflammatory disorders of vagina: Secondary | ICD-10-CM | POA: Diagnosis not present

## 2023-11-27 LAB — WET PREP FOR TRICH, YEAST, CLUE

## 2023-11-29 ENCOUNTER — Encounter: Payer: Self-pay | Admitting: Obstetrics and Gynecology

## 2023-11-29 ENCOUNTER — Telehealth (HOSPITAL_COMMUNITY): Payer: Self-pay | Admitting: Licensed Clinical Social Worker

## 2023-11-29 ENCOUNTER — Other Ambulatory Visit: Payer: Self-pay

## 2023-11-29 DIAGNOSIS — F4310 Post-traumatic stress disorder, unspecified: Secondary | ICD-10-CM | POA: Diagnosis not present

## 2023-11-29 DIAGNOSIS — F1010 Alcohol abuse, uncomplicated: Secondary | ICD-10-CM | POA: Diagnosis not present

## 2023-11-29 DIAGNOSIS — F411 Generalized anxiety disorder: Secondary | ICD-10-CM | POA: Diagnosis not present

## 2023-11-29 DIAGNOSIS — F321 Major depressive disorder, single episode, moderate: Secondary | ICD-10-CM | POA: Diagnosis not present

## 2023-11-29 MED ORDER — METRONIDAZOLE 500 MG PO TABS: 500.0000 mg | ORAL_TABLET | Freq: Two times a day (BID) | ORAL | 0 refills | Status: DC

## 2023-11-29 NOTE — Progress Notes (Signed)
10 wk PO with PO on 12/23 for vaginal cuff repair two week f/u. Doing well.  No VB. Feeling better.  Some mild discomfort on RLQ.  She is staying consistent with pelvic rest. No fevers. NO VB.  Does report  Some vaginal discharge Using vaginal estrogen.  Blood pressure 104/72, temperature (!) 107 F (41.7 C), last menstrual period 07/10/2023, SpO2 98%.   SVE: cuff intact. Qtip with mild palpation.  Possible BV or yeast infection. Culture sent No bleeding.  Sutures seen And cuff healing.  A/p PO RLH with cuff revision after early intercourse  Counseled on strict pelvic rest 13 weeks Nuswab sent.  To contnue vaginal estrace Rtc in 2 weeks or sooner with any concerns   Dr. Karma Greaser

## 2023-11-29 NOTE — Telephone Encounter (Signed)
The therapist receives the following email from [place]:  "The [employer]. I hope is treating u well. I have been looking into getting my Peer Support Certification and I'm having issues on where to start. I can't find anything local and I'm not sure if I'm looking in the right places. I'm not sure if u can help through email, or if I need to make an appt. Let me know. And you'll probably see me soon at IOP. Is it still at the other building?"  The therapist calls Kimberly [name] confirming her identity via two identifiers and provides her the contact number for the [facility] to get more information about beginning to become a Peer.  Myrna Blazer, MA, LCSW, [facility], LCAS 11/29/2023

## 2023-11-30 NOTE — Addendum Note (Signed)
Addended by: Earley Favor on: 11/30/2023 05:07 PM   Modules accepted: Orders

## 2023-12-11 ENCOUNTER — Encounter: Payer: Self-pay | Admitting: Obstetrics and Gynecology

## 2023-12-11 ENCOUNTER — Ambulatory Visit (INDEPENDENT_AMBULATORY_CARE_PROVIDER_SITE_OTHER): Payer: BC Managed Care – PPO | Admitting: Obstetrics and Gynecology

## 2023-12-11 VITALS — BP 110/74 | HR 92

## 2023-12-11 DIAGNOSIS — Z09 Encounter for follow-up examination after completed treatment for conditions other than malignant neoplasm: Secondary | ICD-10-CM

## 2023-12-11 NOTE — Progress Notes (Signed)
PO on 12/23 for vaginal cuff repair two week f/u. Doing well.  No VB. Feeling better.  Some mild discomfort on RLQ.  She is staying  Using vaginal estrogen. She she is feeling much better. Denies having intercourse  Blood pressure 110/74, pulse 92, last menstrual period 07/10/2023, SpO2 99%.   SVE: cuff intact. Qtip with mild palpation. Suture seen starting to dissolve. No bleeding.  Cuff intact and much improved.   A/p PO RLH with cuff revision after early intercourse  Counseled on strict pelvic rest  2.  Rtc in 4 weeks or sooner with any concerns   Dr. Karma Greaser

## 2023-12-20 DIAGNOSIS — F4310 Post-traumatic stress disorder, unspecified: Secondary | ICD-10-CM | POA: Diagnosis not present

## 2023-12-20 DIAGNOSIS — F321 Major depressive disorder, single episode, moderate: Secondary | ICD-10-CM | POA: Diagnosis not present

## 2023-12-20 DIAGNOSIS — F411 Generalized anxiety disorder: Secondary | ICD-10-CM | POA: Diagnosis not present

## 2023-12-20 DIAGNOSIS — F1010 Alcohol abuse, uncomplicated: Secondary | ICD-10-CM | POA: Diagnosis not present

## 2023-12-21 DIAGNOSIS — F419 Anxiety disorder, unspecified: Secondary | ICD-10-CM | POA: Diagnosis not present

## 2023-12-21 DIAGNOSIS — M5416 Radiculopathy, lumbar region: Secondary | ICD-10-CM | POA: Diagnosis not present

## 2023-12-21 DIAGNOSIS — F988 Other specified behavioral and emotional disorders with onset usually occurring in childhood and adolescence: Secondary | ICD-10-CM | POA: Diagnosis not present

## 2023-12-28 DIAGNOSIS — F411 Generalized anxiety disorder: Secondary | ICD-10-CM | POA: Diagnosis not present

## 2023-12-28 DIAGNOSIS — F4310 Post-traumatic stress disorder, unspecified: Secondary | ICD-10-CM | POA: Diagnosis not present

## 2023-12-28 DIAGNOSIS — F321 Major depressive disorder, single episode, moderate: Secondary | ICD-10-CM | POA: Diagnosis not present

## 2023-12-28 DIAGNOSIS — F1010 Alcohol abuse, uncomplicated: Secondary | ICD-10-CM | POA: Diagnosis not present

## 2024-01-08 ENCOUNTER — Ambulatory Visit (INDEPENDENT_AMBULATORY_CARE_PROVIDER_SITE_OTHER): Payer: BC Managed Care – PPO | Admitting: Obstetrics and Gynecology

## 2024-01-08 ENCOUNTER — Encounter: Payer: Self-pay | Admitting: Obstetrics and Gynecology

## 2024-01-08 VITALS — BP 120/78 | HR 90 | Wt 154.0 lb

## 2024-01-08 DIAGNOSIS — Z09 Encounter for follow-up examination after completed treatment for conditions other than malignant neoplasm: Secondary | ICD-10-CM

## 2024-01-08 NOTE — Progress Notes (Signed)
 PO on 12/23 for vaginal cuff repair two week f/u.10 wks Doing well.  No VB.  Using vaginal estrogen. She she is feeling much better. Denies having intercourse  Last menstrual period 07/10/2023.   SVE: cuff intact. Qtip with deep palpation. In midline. Intact and normal discharge.  No sutures seen.  No bleeding.  Cuff intact and much improved.   A/p PO RLH with cuff revision after early intercourse  2 more weeks of estrogen and pelvic rest and then may resume all activities   Dr. Karma Greaser

## 2024-01-10 DIAGNOSIS — F4310 Post-traumatic stress disorder, unspecified: Secondary | ICD-10-CM | POA: Diagnosis not present

## 2024-01-10 DIAGNOSIS — F321 Major depressive disorder, single episode, moderate: Secondary | ICD-10-CM | POA: Diagnosis not present

## 2024-01-10 DIAGNOSIS — F1010 Alcohol abuse, uncomplicated: Secondary | ICD-10-CM | POA: Diagnosis not present

## 2024-01-10 DIAGNOSIS — F411 Generalized anxiety disorder: Secondary | ICD-10-CM | POA: Diagnosis not present

## 2024-01-16 DIAGNOSIS — F321 Major depressive disorder, single episode, moderate: Secondary | ICD-10-CM | POA: Diagnosis not present

## 2024-01-16 DIAGNOSIS — F4310 Post-traumatic stress disorder, unspecified: Secondary | ICD-10-CM | POA: Diagnosis not present

## 2024-01-16 DIAGNOSIS — F411 Generalized anxiety disorder: Secondary | ICD-10-CM | POA: Diagnosis not present

## 2024-01-16 DIAGNOSIS — F1010 Alcohol abuse, uncomplicated: Secondary | ICD-10-CM | POA: Diagnosis not present

## 2024-01-18 ENCOUNTER — Other Ambulatory Visit (HOSPITAL_COMMUNITY): Payer: Self-pay | Admitting: Medical

## 2024-01-22 DIAGNOSIS — F321 Major depressive disorder, single episode, moderate: Secondary | ICD-10-CM | POA: Diagnosis not present

## 2024-01-22 DIAGNOSIS — F4310 Post-traumatic stress disorder, unspecified: Secondary | ICD-10-CM | POA: Diagnosis not present

## 2024-01-22 DIAGNOSIS — F1010 Alcohol abuse, uncomplicated: Secondary | ICD-10-CM | POA: Diagnosis not present

## 2024-01-22 DIAGNOSIS — F411 Generalized anxiety disorder: Secondary | ICD-10-CM | POA: Diagnosis not present

## 2024-01-25 NOTE — Telephone Encounter (Signed)
 Pt no longer mine

## 2024-01-30 DIAGNOSIS — M654 Radial styloid tenosynovitis [de Quervain]: Secondary | ICD-10-CM | POA: Diagnosis not present

## 2024-02-01 DIAGNOSIS — F1010 Alcohol abuse, uncomplicated: Secondary | ICD-10-CM | POA: Diagnosis not present

## 2024-02-01 DIAGNOSIS — F321 Major depressive disorder, single episode, moderate: Secondary | ICD-10-CM | POA: Diagnosis not present

## 2024-02-01 DIAGNOSIS — F411 Generalized anxiety disorder: Secondary | ICD-10-CM | POA: Diagnosis not present

## 2024-02-01 DIAGNOSIS — F4310 Post-traumatic stress disorder, unspecified: Secondary | ICD-10-CM | POA: Diagnosis not present

## 2024-02-02 DIAGNOSIS — M654 Radial styloid tenosynovitis [de Quervain]: Secondary | ICD-10-CM | POA: Diagnosis not present

## 2024-02-07 DIAGNOSIS — F321 Major depressive disorder, single episode, moderate: Secondary | ICD-10-CM | POA: Diagnosis not present

## 2024-02-07 DIAGNOSIS — F1010 Alcohol abuse, uncomplicated: Secondary | ICD-10-CM | POA: Diagnosis not present

## 2024-02-07 DIAGNOSIS — F411 Generalized anxiety disorder: Secondary | ICD-10-CM | POA: Diagnosis not present

## 2024-02-15 DIAGNOSIS — F321 Major depressive disorder, single episode, moderate: Secondary | ICD-10-CM | POA: Diagnosis not present

## 2024-02-15 DIAGNOSIS — F4310 Post-traumatic stress disorder, unspecified: Secondary | ICD-10-CM | POA: Diagnosis not present

## 2024-02-15 DIAGNOSIS — F1010 Alcohol abuse, uncomplicated: Secondary | ICD-10-CM | POA: Diagnosis not present

## 2024-02-15 DIAGNOSIS — F411 Generalized anxiety disorder: Secondary | ICD-10-CM | POA: Diagnosis not present

## 2024-02-27 DIAGNOSIS — M654 Radial styloid tenosynovitis [de Quervain]: Secondary | ICD-10-CM | POA: Diagnosis not present

## 2024-03-04 DIAGNOSIS — Z6824 Body mass index (BMI) 24.0-24.9, adult: Secondary | ICD-10-CM | POA: Diagnosis not present

## 2024-03-04 DIAGNOSIS — F988 Other specified behavioral and emotional disorders with onset usually occurring in childhood and adolescence: Secondary | ICD-10-CM | POA: Diagnosis not present

## 2024-03-04 DIAGNOSIS — K5904 Chronic idiopathic constipation: Secondary | ICD-10-CM | POA: Diagnosis not present

## 2024-03-05 DIAGNOSIS — M654 Radial styloid tenosynovitis [de Quervain]: Secondary | ICD-10-CM | POA: Diagnosis not present

## 2024-03-14 DIAGNOSIS — F411 Generalized anxiety disorder: Secondary | ICD-10-CM | POA: Diagnosis not present

## 2024-03-14 DIAGNOSIS — F1010 Alcohol abuse, uncomplicated: Secondary | ICD-10-CM | POA: Diagnosis not present

## 2024-03-14 DIAGNOSIS — F321 Major depressive disorder, single episode, moderate: Secondary | ICD-10-CM | POA: Diagnosis not present

## 2024-03-14 DIAGNOSIS — F4310 Post-traumatic stress disorder, unspecified: Secondary | ICD-10-CM | POA: Diagnosis not present

## 2024-03-28 DIAGNOSIS — F1010 Alcohol abuse, uncomplicated: Secondary | ICD-10-CM | POA: Diagnosis not present

## 2024-03-28 DIAGNOSIS — F321 Major depressive disorder, single episode, moderate: Secondary | ICD-10-CM | POA: Diagnosis not present

## 2024-03-28 DIAGNOSIS — F411 Generalized anxiety disorder: Secondary | ICD-10-CM | POA: Diagnosis not present

## 2024-03-28 DIAGNOSIS — F4310 Post-traumatic stress disorder, unspecified: Secondary | ICD-10-CM | POA: Diagnosis not present

## 2024-04-16 DIAGNOSIS — F1010 Alcohol abuse, uncomplicated: Secondary | ICD-10-CM | POA: Diagnosis not present

## 2024-04-16 DIAGNOSIS — F4310 Post-traumatic stress disorder, unspecified: Secondary | ICD-10-CM | POA: Diagnosis not present

## 2024-05-20 DIAGNOSIS — F1010 Alcohol abuse, uncomplicated: Secondary | ICD-10-CM | POA: Diagnosis not present

## 2024-05-20 DIAGNOSIS — F4310 Post-traumatic stress disorder, unspecified: Secondary | ICD-10-CM | POA: Diagnosis not present

## 2024-05-28 DIAGNOSIS — F1010 Alcohol abuse, uncomplicated: Secondary | ICD-10-CM | POA: Diagnosis not present

## 2024-05-28 DIAGNOSIS — F4310 Post-traumatic stress disorder, unspecified: Secondary | ICD-10-CM | POA: Diagnosis not present

## 2024-05-30 DIAGNOSIS — F419 Anxiety disorder, unspecified: Secondary | ICD-10-CM | POA: Diagnosis not present

## 2024-05-30 DIAGNOSIS — G4700 Insomnia, unspecified: Secondary | ICD-10-CM | POA: Diagnosis not present

## 2024-05-30 DIAGNOSIS — F988 Other specified behavioral and emotional disorders with onset usually occurring in childhood and adolescence: Secondary | ICD-10-CM | POA: Diagnosis not present

## 2024-06-17 DIAGNOSIS — M25531 Pain in right wrist: Secondary | ICD-10-CM | POA: Diagnosis not present

## 2024-06-17 DIAGNOSIS — M654 Radial styloid tenosynovitis [de Quervain]: Secondary | ICD-10-CM | POA: Diagnosis not present

## 2024-06-17 DIAGNOSIS — G5603 Carpal tunnel syndrome, bilateral upper limbs: Secondary | ICD-10-CM | POA: Diagnosis not present

## 2024-06-17 DIAGNOSIS — M25532 Pain in left wrist: Secondary | ICD-10-CM | POA: Diagnosis not present

## 2024-06-19 DIAGNOSIS — F4310 Post-traumatic stress disorder, unspecified: Secondary | ICD-10-CM | POA: Diagnosis not present

## 2024-06-19 DIAGNOSIS — F1010 Alcohol abuse, uncomplicated: Secondary | ICD-10-CM | POA: Diagnosis not present

## 2024-06-25 DIAGNOSIS — F4310 Post-traumatic stress disorder, unspecified: Secondary | ICD-10-CM | POA: Diagnosis not present

## 2024-06-25 DIAGNOSIS — F1010 Alcohol abuse, uncomplicated: Secondary | ICD-10-CM | POA: Diagnosis not present

## 2024-06-27 ENCOUNTER — Encounter: Payer: Self-pay | Admitting: Radiology

## 2024-06-27 ENCOUNTER — Ambulatory Visit: Admitting: Radiology

## 2024-06-27 VITALS — BP 98/62 | HR 86 | Temp 98.6°F | Wt 156.0 lb

## 2024-06-27 DIAGNOSIS — B9689 Other specified bacterial agents as the cause of diseases classified elsewhere: Secondary | ICD-10-CM | POA: Diagnosis not present

## 2024-06-27 DIAGNOSIS — N898 Other specified noninflammatory disorders of vagina: Secondary | ICD-10-CM | POA: Diagnosis not present

## 2024-06-27 DIAGNOSIS — N760 Acute vaginitis: Secondary | ICD-10-CM | POA: Diagnosis not present

## 2024-06-27 DIAGNOSIS — N3090 Cystitis, unspecified without hematuria: Secondary | ICD-10-CM

## 2024-06-27 DIAGNOSIS — R350 Frequency of micturition: Secondary | ICD-10-CM

## 2024-06-27 LAB — WET PREP FOR TRICH, YEAST, CLUE

## 2024-06-27 MED ORDER — NITROFURANTOIN MONOHYD MACRO 100 MG PO CAPS: 100.0000 mg | ORAL_CAPSULE | Freq: Two times a day (BID) | ORAL | 0 refills | Status: DC

## 2024-06-27 MED ORDER — METRONIDAZOLE 500 MG PO TABS
500.0000 mg | ORAL_TABLET | Freq: Two times a day (BID) | ORAL | 0 refills | Status: DC
Start: 2024-06-27 — End: 2024-07-17

## 2024-06-27 NOTE — Patient Instructions (Signed)

## 2024-06-27 NOTE — Progress Notes (Signed)
      Subjective: Kimberly [name] is a 38 y.o. female who complains of vaginal odor, discharge, no itching, urinary frequency. Symptoms began 2 months ago. S/p hyst 10/24 with vaginal cuff repair 12/24.  Review of Systems  All other systems reviewed and are negative.   Past Medical History:  Diagnosis Date   Abnormal Pap smear    ADD (attention deficit disorder)    Follows w/ PCP and Behavioral Health.   Alcohol  use disorder    Quit drinking 12/30/2021, follows w/ Behavioral Health.   Alcoholic polyneuropathy (HCC)    hx of, now improved, no longer drinking alcohol    Anxiety    Follows w/ Behavioral Health & PCP.   Depression    Follows w/ PCP and therapist.   GERD (gastroesophageal reflux disease)    takes Protonix  bid   Guillain Barr syndrome (HCC) 2020   residual symptoms of feet being numb up to mid calf and left-hand numbness. Follows w/ PCP, Dr. Bertell @ [facility] in [place]. Patient ambulates without a cane or walker[place]   HSV-1 infection    Hyperglycemia 12/06/2018   IBS (irritable bowel syndrome)    Knee hyperextension injury 2020   Left, being treated by ortho   Pneumonia 08/2022   Covid pneumonia   Tachycardia    Wears contact lenses    Wears glasses       Objective:  Today's Vitals   06/27/24 0934  BP: 98/62  Pulse: 86  Temp: 98.6 F (37 C)  TempSrc: Oral  SpO2: 98%  Weight: 156 lb (70.8 kg)   Body mass index is 24.07 kg/m.   Physical Exam Vitals and nursing note reviewed. Exam conducted with a chaperone present.  Constitutional:      Appearance: Normal appearance. She is well-developed.  Pulmonary:     Effort: Pulmonary effort is normal.  Abdominal:     General: Abdomen is flat.     Palpations: Abdomen is soft.  Genitourinary:    General: Normal vulva.     Vagina: Vaginal discharge present. No erythema, bleeding or lesions.     Cervix: No discharge, friability, lesion or erythema.     Uterus: Absent.       Adnexa: Right adnexa normal and left adnexa normal.  Neurological:     Mental Status: She is alert.  Psychiatric:        Mood and Affect: Mood normal.        Thought Content: Thought content normal.        Judgment: Judgment normal.     Urine dipstick shows positive for protein, positive for nitrates, positive for urobilinogen, and positive for ketones.  Micro exam: 6-10 WBC's per HPF and 10-20+ bacteria.  Microscopic wet-mount exam shows clue cells.   Darice Hoit, CMA present for exam  Assessment:/Plan:  1. Urinary frequency - Urinalysis,Complete w/RFL Culture  2. Vaginal odor (Primary) - WET PREP FOR TRICH, YEAST, CLUE  3. Cystitis - nitrofurantoin , macrocrystal-monohydrate, (MACROBID ) 100 MG capsule; Take 1 capsule (100 mg total) by mouth 2 (two) times daily.  Dispense: 14 capsule; Refill: 0  4. BV (bacterial vaginosis) - metroNIDAZOLE  (FLAGYL ) 500 MG tablet; Take 1 tablet (500 mg total) by mouth 2 (two) times daily.  Dispense: 14 tablet; Refill: 0    Shaylene Paganelli B, NP 9:41 AM

## 2024-06-29 LAB — URINE CULTURE
MICRO NUMBER:: 16864435
SPECIMEN QUALITY:: ADEQUATE

## 2024-06-29 LAB — URINALYSIS, COMPLETE W/RFL CULTURE
Glucose, UA: NEGATIVE
Hgb urine dipstick: NEGATIVE
Hyaline Cast: NONE SEEN /LPF
Leukocyte Esterase: NEGATIVE
Nitrites, Initial: POSITIVE — AB
RBC / HPF: NONE SEEN /HPF (ref 0–2)
Specific Gravity, Urine: 1.025 (ref 1.001–1.035)
pH: 5.5 (ref 5.0–8.0)

## 2024-06-29 LAB — CULTURE INDICATED

## 2024-07-01 DIAGNOSIS — G5601 Carpal tunnel syndrome, right upper limb: Secondary | ICD-10-CM | POA: Diagnosis not present

## 2024-07-01 DIAGNOSIS — G5621 Lesion of ulnar nerve, right upper limb: Secondary | ICD-10-CM | POA: Insufficient documentation

## 2024-07-01 DIAGNOSIS — M654 Radial styloid tenosynovitis [de Quervain]: Secondary | ICD-10-CM | POA: Insufficient documentation

## 2024-07-01 DIAGNOSIS — M25532 Pain in left wrist: Secondary | ICD-10-CM | POA: Diagnosis not present

## 2024-07-01 DIAGNOSIS — M25531 Pain in right wrist: Secondary | ICD-10-CM | POA: Diagnosis not present

## 2024-07-02 ENCOUNTER — Ambulatory Visit: Payer: Self-pay | Admitting: Radiology

## 2024-07-04 DIAGNOSIS — M25531 Pain in right wrist: Secondary | ICD-10-CM | POA: Diagnosis not present

## 2024-07-11 DIAGNOSIS — F4310 Post-traumatic stress disorder, unspecified: Secondary | ICD-10-CM | POA: Diagnosis not present

## 2024-07-11 DIAGNOSIS — F1010 Alcohol abuse, uncomplicated: Secondary | ICD-10-CM | POA: Diagnosis not present

## 2024-07-12 DIAGNOSIS — M654 Radial styloid tenosynovitis [de Quervain]: Secondary | ICD-10-CM | POA: Diagnosis not present

## 2024-07-12 DIAGNOSIS — G5601 Carpal tunnel syndrome, right upper limb: Secondary | ICD-10-CM | POA: Diagnosis not present

## 2024-07-12 DIAGNOSIS — G5621 Lesion of ulnar nerve, right upper limb: Secondary | ICD-10-CM | POA: Diagnosis not present

## 2024-07-17 ENCOUNTER — Encounter: Payer: Self-pay | Admitting: Nurse Practitioner

## 2024-07-17 ENCOUNTER — Ambulatory Visit (INDEPENDENT_AMBULATORY_CARE_PROVIDER_SITE_OTHER): Admitting: Nurse Practitioner

## 2024-07-17 VITALS — BP 118/64 | Resp 16 | Ht 66.75 in | Wt 159.0 lb

## 2024-07-17 DIAGNOSIS — B9689 Other specified bacterial agents as the cause of diseases classified elsewhere: Secondary | ICD-10-CM

## 2024-07-17 DIAGNOSIS — N941 Unspecified dyspareunia: Secondary | ICD-10-CM

## 2024-07-17 DIAGNOSIS — Z01419 Encounter for gynecological examination (general) (routine) without abnormal findings: Secondary | ICD-10-CM | POA: Diagnosis not present

## 2024-07-17 DIAGNOSIS — N760 Acute vaginitis: Secondary | ICD-10-CM

## 2024-07-17 DIAGNOSIS — Z9189 Other specified personal risk factors, not elsewhere classified: Secondary | ICD-10-CM | POA: Diagnosis not present

## 2024-07-17 DIAGNOSIS — Z1331 Encounter for screening for depression: Secondary | ICD-10-CM

## 2024-07-17 DIAGNOSIS — N898 Other specified noninflammatory disorders of vagina: Secondary | ICD-10-CM

## 2024-07-17 DIAGNOSIS — Z87440 Personal history of urinary (tract) infections: Secondary | ICD-10-CM

## 2024-07-17 LAB — WET PREP FOR TRICH, YEAST, CLUE

## 2024-07-17 LAB — URINALYSIS, COMPLETE W/RFL CULTURE
Bacteria, UA: NONE SEEN /HPF
Bilirubin Urine: NEGATIVE
Glucose, UA: NEGATIVE
Hgb urine dipstick: NEGATIVE
Hyaline Cast: NONE SEEN /LPF
Ketones, ur: NEGATIVE
Leukocyte Esterase: NEGATIVE
Nitrites, Initial: NEGATIVE
Protein, ur: NEGATIVE
RBC / HPF: NONE SEEN /HPF (ref 0–2)
Specific Gravity, Urine: 1.02 (ref 1.001–1.035)
WBC, UA: NONE SEEN /HPF (ref 0–5)
pH: 7 (ref 5.0–8.0)

## 2024-07-17 LAB — NO CULTURE INDICATED

## 2024-07-17 MED ORDER — NUVESSA 1.3 % VA GEL: 1.0000 | Freq: Once | VAGINAL | 0 refills | Status: AC

## 2024-07-17 MED ORDER — IMVEXXY MAINTENANCE PACK 10 MCG VA INST: 1.0000 | VAGINAL_INSERT | VAGINAL | 5 refills | Status: DC

## 2024-07-17 NOTE — Progress Notes (Signed)
 Kimberly [name] [birth year] [id number]   History:  38 y.o. G1P0102 presents for annual exam. Complains of vaginal odor and white discharge. Treated for BV and UTI 8/21. Wants to recheck UA. S/P 08/2023 RALH for DUB. Had vaginal cuff dehiscence repair after resumption of intercourse in December 2024. Still has some pain with deep penetration. Did vaginal estrogen cream with little improvement in pain and dryness, was also messy. Normal pap history. Lifetime breast cancer risk of 30.7%. Annual MRI in conjunction with mammogram recommended.   Gynecologic History Patient's last menstrual period was 07/10/2023 (approximate).   Contraception: status post hysterectomy Sexually active: Yes  Health Maintenance Last Pap: 08/17/2023. Results were: Normal neg HPV Last mammogram (diagnostic): 11/20/2023. Results were: Normal Last colonoscopy: 09/16/2014 Last Dexa: Not indicated     07/17/2024   10:32 AM  Depression screen PHQ 2/9  Decreased Interest 0  Down, Depressed, Hopeless 0  PHQ - 2 Score 0     Past medical history, past surgical history, family history and social history were all reviewed and documented in the EPIC chart. Married. 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 40 yo twin boys. Mother (age 40s), maternal grandmother and great aunt with history of breast cancer. Mother BRCA negative.   ROS:  A ROS was performed and pertinent positives and negatives are included.  Exam:  Vitals:   07/17/24 1024  BP: 118/64  Resp: 16  SpO2: 99%  Weight: 159 lb (72.1 kg)  Height: 5' 6.75 (1.695 m)     Body mass index is 25.09 kg/m.  General appearance:  Normal Thyroid :  Symmetrical, normal in size, without palpable masses or nodularity. Respiratory  Auscultation:  Clear without wheezing or rhonchi Cardiovascular  Auscultation:  Regular rate, without rubs, murmurs or gallops  Edema/varicosities:  Not grossly evident Abdominal  Soft,nontender, without masses,  guarding or rebound.  Liver/spleen:  No organomegaly noted  Hernia:  None appreciated  Skin  Inspection:  Grossly normal   Breasts: Examined lying and sitting.   Right: Without masses, retractions, discharge or axillary adenopathy.   Left: Without masses, retractions, discharge or axillary adenopathy. Pelvic: External genitalia:  no lesions              Urethra:  normal appearing urethra with no masses, tenderness or lesions              Bartholins and Skenes: normal                 Vagina: + white, thick discharge, no erythema              Cervix: absent Bimanual Exam:  Uterus:  absent              Adnexa: no mass, fullness, tenderness              Rectovaginal: Deferred              Anus:  normal, no lesions  UA negative  Wet prep + clue cells (+ odor)  Assessment/Plan:  38 y.o. [id number] for annual exam.   Well female exam with routine gynecological exam - Education provided on SBEs, importance of preventative screenings, current guidelines, high calcium  diet, regular exercise, and multivitamin daily. Labs done with PCP.  At high risk for breast cancer - Plan: Ambulatory referral to Genetics. Lifetime breast cancer risk of 30.7%. Recommend annual breast MRI in conjunction with mammogram. Normal breast exam today.   History of UTI - Plan: Urinalysis,Complete  w/RFL Culture. Neg UA.   Vaginal odor - Plan: WET PREP FOR TRICH, YEAST, CLUE. + BV.   Vaginal dryness - Plan: Estradiol  (IMVEXXY  MAINTENANCE PACK) 10 MCG INST twice weekly. Painful intercourse, dryness. Did vaginal estrogen cream with little improvement in pain and dryness, was also messy. Aware sent to specialty pharmacy.   BV (bacterial vaginosis) - Plan: metroNIDAZOLE  (NUVESSA ) 1.3 % GEL once. Failed course of Flagyl . Recommend women's probiotic, boric acid after intercourse or twice weekly.   Dyspareunia in female - Plan: Estradiol  (IMVEXXY  MAINTENANCE PACK) 10 MCG INST twice weekly. Since vaginal cuff repair. Discussed  scar tissue and repairing of nerves.   Screening for cervical cancer - Normal Pap history.  Will repeat at 5-year interval per guidelines.  Return in about 1 year (around 07/17/2025) for Annual.      Kimberly [name] [facility], 11:09 AM 07/17/2024

## 2024-07-19 ENCOUNTER — Other Ambulatory Visit: Payer: Self-pay | Admitting: Medical Genetics

## 2024-07-19 ENCOUNTER — Telehealth: Payer: Self-pay | Admitting: *Deleted

## 2024-07-19 NOTE — Telephone Encounter (Signed)
 Spoke with patient. Rx for Nuvessa  not covered, requesting oral flagyl .   Per review of 07/17/24 OV note, failed flagyl . Discussed use of manufacture savings card for Nuvessa , patient declines, requesting Flagyl .   Advised Kimberly [name] is out of the office today, will send request and our office will f/u once reviewed. Patient agreeable. Request Rx to [facility] on file. Patient agreeable.   Kimberly [name] -please review.

## 2024-07-22 ENCOUNTER — Inpatient Hospital Stay

## 2024-07-22 ENCOUNTER — Inpatient Hospital Stay: Attending: Hematology

## 2024-07-22 ENCOUNTER — Other Ambulatory Visit: Payer: Self-pay

## 2024-07-22 ENCOUNTER — Other Ambulatory Visit: Payer: Self-pay | Admitting: Nurse Practitioner

## 2024-07-22 DIAGNOSIS — Z8042 Family history of malignant neoplasm of prostate: Secondary | ICD-10-CM | POA: Insufficient documentation

## 2024-07-22 DIAGNOSIS — Z803 Family history of malignant neoplasm of breast: Secondary | ICD-10-CM | POA: Diagnosis not present

## 2024-07-22 DIAGNOSIS — Z1379 Encounter for other screening for genetic and chromosomal anomalies: Secondary | ICD-10-CM | POA: Diagnosis not present

## 2024-07-22 DIAGNOSIS — Z809 Family history of malignant neoplasm, unspecified: Secondary | ICD-10-CM

## 2024-07-22 DIAGNOSIS — Z800 Family history of malignant neoplasm of digestive organs: Secondary | ICD-10-CM | POA: Diagnosis not present

## 2024-07-22 DIAGNOSIS — B9689 Other specified bacterial agents as the cause of diseases classified elsewhere: Secondary | ICD-10-CM

## 2024-07-22 LAB — GENETIC SCREENING ORDER

## 2024-07-22 MED ORDER — METRONIDAZOLE 500 MG PO TABS: 500.0000 mg | ORAL_TABLET | Freq: Two times a day (BID) | ORAL | 0 refills | Status: DC

## 2024-07-22 NOTE — Telephone Encounter (Signed)
Call placed to patient, left detailed message, ok per dpr. Advised per Tiffany. Return call to office if any additional questions.   Encounter closed.

## 2024-07-22 NOTE — Telephone Encounter (Signed)
 PO Flagyl  sent to [facility]. Thanks.

## 2024-07-22 NOTE — Progress Notes (Addendum)
 REFERRING PROVIDER: Prentiss Annabella LABOR, NP [address] [place],  [place] [address]  PRIMARY PROVIDER:  Bertell Satterfield, MD  PRIMARY REASON FOR VISIT:  No diagnosis found.   HISTORY OF PRESENT ILLNESS:   Ms. Schnyder, a 38 y.o. female, was seen for a [facility] cancer genetics consultation at the request of Annabella LABOR Prentiss, NP due to a family history of cancer and fall within the high risk breast cancer population. Ms. Mcconahy has a lifetime breast cancer risk of 34.24%. Ms. Pokorny presents to clinic today to discuss the possibility of a hereditary predisposition to cancer, genetic testing, and to further clarify her future cancer risks, as well as potential cancer risks for family members. She was motivated to pursue genetic testing and expressed that she has wished to pursue genetic testing since her mother's death.  Ms. Meroney is a 38 y.o. female with no personal history of cancer.    CANCER HISTORY:  Oncology History   No history exists.   RISK FACTORS:  Menarche was at age 53.  First live birth at age 34.  Ovaries intact: yes.  Hysterectomy: yes.  Menopausal status: perimenopausal.  Mammogram within the last year: yes. Number of breast biopsies: 1. Up to date with pelvic exams: yes.  Past Medical History:  Diagnosis Date   Abnormal Pap smear    ADD (attention deficit disorder)    Follows w/ PCP and Behavioral Health.   Alcohol  use disorder    Quit drinking 12/30/2021, follows w/ Behavioral Health.   Alcoholic polyneuropathy (HCC)    hx of, now improved, no longer drinking alcohol    Anxiety    Follows w/ Behavioral Health & PCP.   Depression    Follows w/ PCP and therapist.   GERD (gastroesophageal reflux disease)    takes Protonix  bid   Guillain Barr syndrome (HCC) 2020   residual symptoms of feet being numb up to mid calf and left-hand numbness. Follows w/ PCP, Dr. Bertell @ [facility] in [place]. Patient ambulates without a cane or  [place], [place].   HSV-1 infection    Hyperglycemia 12/06/2018   IBS (irritable bowel syndrome)    Knee hyperextension injury 2020   Left, being treated by ortho   Pneumonia 08/2022   Covid pneumonia   Tachycardia    Wears contact lenses    Wears glasses     Past Surgical History:  Procedure Laterality Date   BREAST BIOPSY Right    benign   CESAREAN SECTION  09/13/2012   Procedure: CESAREAN SECTION;  Surgeon: Elveria Mungo, MD;  Location: [facility];  Service: Obstetrics;  Laterality: N/A;   cortisone shot R wrist Right 2020   CYSTOSCOPY N/A 09/01/2023   Procedure: CYSTOSCOPY;  Surgeon: Glennon Almarie POUR, MD;  Location: [facility];  Service: Gynecology;  Laterality: N/A;   KYPHOPLASTY N/A 08/27/2013   Procedure: Thoracic Twelve Kyphoplasty;  Surgeon: Fairy Levels, MD;  Location: [facility];  Service: Neurosurgery;  Laterality: N/A;  T12 Kyphoplasty   LAPAROSCOPIC TUBAL LIGATION Bilateral 10/21/2019   Procedure: LAPAROSCOPIC TUBAL LIGATION WITH CAUTERY;  Surgeon: Lavoie, Marie-Lyne, MD;  Location: [facility];  Service: Gynecology;  Laterality: Bilateral;  Request to follow 2nd case in [place] [facility] block requests one hour   LAPAROSCOPY N/A 10/30/2023   Procedure: LAPAROSCOPY DIAGNOSTIC;  Surgeon: Glennon Almarie POUR, MD;  Location: [facility];  Service: Gynecology;  Laterality: N/A;   REPAIR VAGINAL CUFF N/A 10/30/2023   Procedure: REPAIR VAGINAL  CUFF;  Surgeon: Glennon Almarie POUR, MD;  Location: [facility];  Service: Gynecology;  Laterality: N/A;   ROBOTIC ASSISTED LAPAROSCOPIC HYSTERECTOMY AND SALPINGECTOMY Bilateral 09/01/2023   Procedure: XI ROBOTIC ASSISTED LAPAROSCOPIC HYSTERECTOMY AND SALPINGECTOMY;  Surgeon: Glennon Almarie POUR, MD;  Location: [facility];  Service: Gynecology;  Laterality: Bilateral;   WISDOM TOOTH EXTRACTION     x4   WRIST SURGERY Right 07/23/2013    Social History   Socioeconomic History    Marital status: Married    Spouse name: Not on file   Number of children: 2   Years of education: Not on file   Highest education level: Not on file  Occupational History   Occupation: In home health   Tobacco Use   Smoking status: Every Day    Types: Cigarettes   Smokeless tobacco: Never   Tobacco comments:    Smokes 1/2 pack per day. Started in 1997.  Vaping Use   Vaping status: Every Day   Start date: 11/07/2018   Substances: Nicotine, Flavoring  Substance and Sexual Activity   Alcohol  use: Not Currently    Comment: No alcohol  since 2023   Drug use: Never   Sexual activity: Yes    Partners: Male    Birth control/protection: Surgical    Comment: Hyst, 1st intercourse 38 yo-More than 5 partners  Other Topics Concern   Not on file  Social History Narrative   Lives with husband and twin sons and stepson.   Social Drivers of Health   Financial Resource Strain: Patient Declined (06/16/2024)   Received from [facility]   Overall Financial Resource Strain (CARDIA)    How hard is it for you to pay for the very basics like food, housing, medical care, and heating?: Patient declined  Food Insecurity: Patient Declined (06/16/2024)   Received from [facility]   Hunger Vital Sign    Within the past 12 months, you worried that your food would run out before you got the money to buy more.: Patient declined    Within the past 12 months, the food you bought just didn't last and you didn't have money to get more.: Patient declined  Transportation Needs: Patient Declined (06/16/2024)   Received from [facility] - Transportation    In the past 12 months, has lack of transportation kept you from medical appointments or from getting medications?: Patient declined    In the past 12 months, has lack of transportation kept you from meetings, work, or from getting things needed for daily living?: Patient declined  Physical Activity: Inactive (06/16/2024)   Received from [facility]    Exercise Vital Sign    On average, how many days per week do you engage in moderate to strenuous exercise (like a brisk walk)?: 0 days    Minutes of Exercise per Session: Not on file  Stress: Patient Declined (06/16/2024)   Received from [facility] - Occupational Stress Questionnaire    Do you feel stress - tense, restless, nervous, or anxious, or unable to sleep at night because your mind is troubled all the time - these days?: Patient declined  Social Connections: Socially Integrated (06/16/2024)   Received from [facility]   Social Network    How would you rate your social network (family, work, friends)?: Good participation with social networks     FAMILY HISTORY:  We obtained a detailed, 4-generation family history.  Significant diagnoses are listed below:  Family History  Problem Relation Age of Onset   Breast cancer Mother 63       Lumpectomy   Depression Mother        chronic   Hearing loss Mother    Hyperlipidemia Mother    Vision loss Mother        beachets disease   Pancreatic cancer Mother 31   COPD Father    Diabetes Father    Skin cancer Father    Parkinsonism Maternal Grandmother    Heart disease Maternal Grandmother    Breast cancer Maternal Grandmother 34   Heart disease Maternal Grandfather    Alzheimer's disease Paternal Grandmother    Heart disease Paternal Grandfather        Died age 43   Nephrolithiasis Paternal Grandfather    Skin cancer Paternal Grandfather    Breast cancer Other 73 - 69   Prostate cancer Maternal Uncle    Cancer - Prostate Maternal Uncle 50 - 79   Breast cancer Maternal Aunt    Liver cancer Other    Cancer Other        Rare form of cancer, Rachal reported she had genetic testing unsure of cause  Pedigree:    Ms. Hollick is aware of previous family history of genetic testing for hereditary cancer risks.   GENETIC COUNSELING ASSESSMENT: Ms. Burkel is a 38 y.o. female with a  family history of breast and ovarian which is somewhat suggestive of a predisposition to cancer given. We, therefore, discussed and recommended the following at today's visit.   DISCUSSION: We discussed that, in general, most cancer is not inherited in families, but instead is sporadic or familial. Sporadic cancers occur by chance and typically happen at older ages (>50 years) as this type of cancer is caused by genetic changes acquired during an individual's lifetime. Some families have more cancers than would be expected by chance; however, the ages or types of cancer are not consistent with a known genetic mutation or known genetic mutations have been ruled out. This type of familial cancer is thought to be due to a combination of multiple genetic, environmental, hormonal, and lifestyle factors. While this combination of factors likely increases the risk of cancer, the exact source of this risk is not currently identifiable or testable.  We discussed that ~70% of cancer is sporadic, 20% is familial and 5%-10% is hereditary, with most cases associated with cancer including breast, ovarian and colorectal. There are other genes that can be associated with hereditary  cancer syndromes. We discussed that testing is beneficial for several reasons including knowing how to follow individuals after completing their treatment, identifying whether potential treatment options such as PARP inhibitors would be beneficial, and understand if other family members could be at risk for cancer and allow them to undergo genetic testing.   We reviewed the characteristics, features and inheritance patterns of hereditary cancer syndromes. We also discussed genetic testing, including the appropriate family members to test, the process of testing, insurance coverage and turn-around-time for results. We discussed the implications of a negative, positive, carrier and/or variant of uncertain significant result. Ms. Witts  was offered  a common hereditary cancer panel (48 genes) and an expanded multi-cancer panel (70 genes). Ms. Martin was informed of the benefits and limitations of each panel, including that expanded pan-cancer panels contain genes that do not have clear management guidelines at this point in time. We also discussed that as the number of genes included on a panel increases, the  chances of variants of uncertain significance increases. Ms. Dibbern decided to pursue genetic testing for the Multi-Cancer + RNA Panel offered by [facility].  The test includes sequencing and/or deletion/duplication analysis of the following 70 genes:  AIP*, ALK, APC*, ATM*, AXIN2*, BAP1*, BARD1*, BLM*, BMPR1A*, BRCA1*, BRCA2*, BRIP1*, CDC73*, CDH1*, CDK4, CDKN1B*, CDKN2A, CHEK2*, CTNNA1*, DICER1*, EPCAM (del/dup only), EGFR, FH*, FLCN*, GREM1 (promoter dup only), HOXB13, KIT, LZTR1, MAX*, MBD4, MEN1*, MET, MITF, MLH1*, MSH2*, MSH3*, MSH6*, MUTYH*, NF1*, NF2*, NTHL1*, PALB2*, PDGFRA, PMS2*, POLD1*, POLE*, POT1*, PRKAR1A*, PTCH1*, PTEN*, RAD51C*, RAD51D*, RB1*, RET, SDHA* (sequencing only), SDHAF2*, SDHB*, SDHC*, SDHD*, SMAD4*, SMARCA4*, SMARCB1*, SMARCE1*, STK11*, SUFU*, TMEM127*, TP53*, TSC1*, TSC2*, VHL*. RNA analysis is performed for * genes.   Based on Ms. Wimberley's family history of cancer, she meets medical criteria for genetic testing. Though Ms. Herdt is not personally affected, there are no affected family members that are available to undergo hereditary cancer testing.  Therefore, Ms. Jaskowiak the most informative family member available.  Despite that she meets criteria, she may still have an out of pocket cost. We discussed that if her out of pocket cost for testing is over $100, the laboratory will call and confirm whether she wants to proceed with testing.  If the out of pocket cost of testing is less than $100 she will be billed by the genetic testing laboratory.   We reviewed the characteristics, features and inheritance patterns of  hereditary cancer syndromes. We also discussed genetic testing, including the appropriate family members to test, the process of testing, insurance coverage and turn-around-time for results. We discussed the implications of a negative, positive and/or variant of uncertain significant result. Ms. Torbert plans to use genetic test results to guide surgical decisions making. She expressed that she would likely pursue a mastectomy if she found to have a hereditary caner syndrome associated with increased risk for breast caner.   Additionally Reviewed: We discussed that even if Ms. Heathman's testing is negative she meets criteria for high-risk screening based on her family history. As a patient at high risk for breast cancer, she is eligible for high-risk screening. She expressed interest in being referred to the [facility] Cancer [facility] and being followed by that team.   Based on Ms. Towner's family history of cancer, she meets medical criteria for genetic testing. Despite that she meets criteria, she may still have an out of pocket cost.   Ms. Cleland life time risk of breast cancer was recalculated with updated medical and family history information using the [employer] statistical model. Her estimated lifetime risk of developing breast cancer is approximately 34.24%. This estimation does not consider any genetic testing results. The patient's lifetime breast cancer risk is a preliminary estimate based on available information using one of several models endorsed by the American Cancer Society (ACS). The ACS recommends consideration of breast MRI screening as an adjunct to mammography for patients at high risk (defined as 20% or greater lifetime risk). Please note that a woman's breast cancer risk changes over time. It may increase or decrease based on age and any changes to the personal and/or family medical history. The risks and recommendations listed above apply to this patient at this point in  time. In the future, she may or may not be eligible for the same medical management strategies and, in some cases, other medical management strategies may become available to her. If she is interested in an updated breast cancer risk assessment at a later date, she can contact us .  Ms. Folta has been determined to be at high risk for breast cancer. We reviewed that based on her highrisk status she qualifies for high risk cancer screening as recommended by the [employer] (NCCN). The NCCN recommends the following for women with greater than 20% lifetime risk of breast cancer:  1. Clinical encounter every 6-12 months to begin when identified as being at increased risk, but not before age 59  2. Annual mammograms. Tomosynthesis is recommended starting 10 years earlier than the youngest breast cancer diagnosis in the family or at age 24 (whichever comes first), but not before age 6   42. Annual breast MRI starting 10 years earlier than the youngest breast cancer diagnosis in the family or at age 68 (whichever comes first), but not before age 4.    PLAN:  After considering the risks, benefits, and limitations, Ms. Matlin provided informed consent to pursue genetic testing and the blood sample was sent to [facility] for analysis of the Multi-Cancer + RNA Panel. Results should be available within approximately 2-3 weeks' time, at which point they will be disclosed by telephone to Ms. Janssens, as will any additional recommendations warranted by these results. Ms. Maragh will receive a summary of her genetic counseling visit and a copy of her results once available. This information will also be available in Epic.  Ms. York accepted a referral to the [facility] Risk Breast [facility] with Donnice Bury, MD. The referral will be placed at the time results are returned.   Lastly, we encouraged Ms. Serda to remain in contact with cancer genetics annually so that we can  continuously update the family history and inform her of any changes in cancer genetics and testing that may be of benefit for this family.   Ms. Chriswell questions were answered to her satisfaction today. Our contact information was provided should additional questions or concerns arise. Thank you for the referral and allowing us  to share in the care of your patient.   Santana Fryer, MS, CGC  Certified Genetic Counselor  Email: [email or web address]  Phone: [phone]  In total, 60 minutes were spent on the date of the encounter in service to the patient including preparation, face-to-face consultation, documentation and care coordination.  The patient was seen alone. Drs. Lanny Stalls, and/or Gudena were available for questions, if needed.  _______________________________________________________________________ For Office Staff:  Number of people involved in session: 1 Was an Intern/ student involved with case: no

## 2024-07-25 DIAGNOSIS — M654 Radial styloid tenosynovitis [de Quervain]: Secondary | ICD-10-CM | POA: Diagnosis not present

## 2024-07-25 DIAGNOSIS — M19031 Primary osteoarthritis, right wrist: Secondary | ICD-10-CM | POA: Diagnosis not present

## 2024-07-25 DIAGNOSIS — M19131 Post-traumatic osteoarthritis, right wrist: Secondary | ICD-10-CM | POA: Diagnosis not present

## 2024-07-25 DIAGNOSIS — G5621 Lesion of ulnar nerve, right upper limb: Secondary | ICD-10-CM | POA: Diagnosis not present

## 2024-07-25 DIAGNOSIS — M19032 Primary osteoarthritis, left wrist: Secondary | ICD-10-CM | POA: Diagnosis not present

## 2024-07-25 DIAGNOSIS — G5601 Carpal tunnel syndrome, right upper limb: Secondary | ICD-10-CM | POA: Diagnosis not present

## 2024-07-28 DIAGNOSIS — Z1379 Encounter for other screening for genetic and chromosomal anomalies: Secondary | ICD-10-CM | POA: Insufficient documentation

## 2024-07-31 DIAGNOSIS — F1010 Alcohol abuse, uncomplicated: Secondary | ICD-10-CM | POA: Diagnosis not present

## 2024-07-31 DIAGNOSIS — F4310 Post-traumatic stress disorder, unspecified: Secondary | ICD-10-CM | POA: Diagnosis not present

## 2024-08-01 ENCOUNTER — Telehealth: Payer: Self-pay

## 2024-08-01 NOTE — Telephone Encounter (Signed)
 I attempted to contact Kimberly [name] to review her genetic test results. She was not available at this time and a voice mail was left requesting a call back at her convenience.   I will follow-up with Kimberly via phone to review her results.  Santana Fryer, MS, CGC  Certified Genetic Counselor  Email: [email or web address]  Phone: [phone]

## 2024-08-02 ENCOUNTER — Ambulatory Visit: Payer: Self-pay

## 2024-08-02 NOTE — Telephone Encounter (Signed)
 I attempted to return Destine Shipp's call to review her genetic test results. I was unable to reach her at this time and I will follow-up with her next week.   Santana Fryer, MS, CGC  Certified Genetic Counselor  Email: [email or web address]  Phone: [phone]

## 2024-08-02 NOTE — Progress Notes (Signed)
 HPI:  Kimberly [name] was previously seen in the [facility] due to a family history of  and concerns regarding a hereditary predisposition to cancer. Please refer to our prior cancer genetics clinic note for more information regarding our discussion, assessment and recommendations, at the time. Kimberly [name] recent genetic test results were disclosed to her, as were recommendations warranted by these results. These results and recommendations are discussed in more detail below.  CANCER HISTORY:  Oncology History   No history exists.    FAMILY HISTORY:  We obtained a detailed, 4-generation family history.  Significant diagnoses are listed below: Family History  Problem Relation Age of Onset   Breast cancer Mother 80       Lumpectomy   Depression Mother        chronic   Hearing loss Mother    Hyperlipidemia Mother    Vision loss Mother        beachets disease   Pancreatic cancer Mother 53   COPD Father    Diabetes Father    Skin cancer Father    Parkinsonism Maternal Grandmother    Heart disease Maternal Grandmother    Breast cancer Maternal Grandmother 43   Heart disease Maternal Grandfather    Alzheimer's disease Paternal Grandmother    Heart disease Paternal Grandfather        Died age 40   Nephrolithiasis Paternal Grandfather    Skin cancer Paternal Grandfather    Breast cancer Other 70 - 69   Prostate cancer Maternal Uncle    Cancer - Prostate Maternal Uncle 26 - 79   Breast cancer Maternal Aunt    Liver cancer Other    Cancer Other        Rare form of cancer, Kimberly [name] reported she had genetic testing unsure of cause   GENETIC TEST RESULTS: Genetic testing reported out on 07/28/2024 through the [facility] Multi-cancer panel found no pathogenic mutations. The Multi-Cancer + RNA Panel offered by [facility] includes sequencing and/or deletion/duplication analysis of the following 70 genes:  AIP*, ALK, APC*, ATM*, AXIN2*, BAP1*, BARD1*, BLM*, BMPR1A*, BRCA1*,  BRCA2*, BRIP1*, CDC73*, CDH1*, CDK4, CDKN1B*, CDKN2A, CHEK2*, CTNNA1*, DICER1*, EPCAM (del/dup only), EGFR, FH*, FLCN*, GREM1 (promoter dup only), HOXB13, KIT, LZTR1, MAX*, MBD4, MEN1*, MET, MITF, MLH1*, MSH2*, MSH3*, MSH6*, MUTYH*, NF1*, NF2*, NTHL1*, PALB2*, PDGFRA, PMS2*, POLD1*, POLE*, POT1*, PRKAR1A*, PTCH1*, PTEN*, RAD51C*, RAD51D*, RB1*, RET, SDHA* (sequencing only), SDHAF2*, SDHB*, SDHC*, SDHD*, SMAD4*, SMARCA4*, SMARCB1*, SMARCE1*, STK11*, SUFU*, TMEM127*, TP53*, TSC1*, TSC2*, VHL*. RNA analysis is performed for * genes. The test report has been scanned into EPIC and is located under the Molecular Pathology section of the Results Review tab.  A portion of the result report is included below for reference.     We discussed with Kimberly [name] that because current genetic testing is not perfect, it is possible there may be a gene mutation in one of these genes that current testing cannot detect, but that chance is small.  We also discussed, that there could be another gene that has not yet been discovered, or that we have not yet tested, that is responsible for the cancer diagnoses in the family. It is also possible there is a hereditary cause for the cancer in the family that Ms. Carew did not inherit and therefore was not identified in her testing.  Therefore, it is important to remain in touch with cancer genetics in the future so that we can continue to offer Ms. Beed the most up to  date genetic testing.   ADDITIONAL GENETIC TESTING: We discussed with Kimberly [name] that there are other genes that are associated with increased cancer risk that can be analyzed. Should Kimberly [name] wish to pursue additional genetic testing, we are happy to discuss and coordinate this testing, at any time.    We discussed with Kimberly [name] that her genetic testing was fairly extensive.  If there are genes identified to increase cancer risk that can be analyzed in the future, we would be happy to discuss and coordinate  this testing at that time.    CANCER SCREENING RECOMMENDATIONS: Ms. Morro test result is considered negative (normal).  This means that we have not identified a hereditary cause for her family history of cancer at this time. Most cancers happen by chance and this negative is reassuring but does NOT rule out that there could be a genetic variant assoicaed with increased risk for hereditary cancer in her family and it could be beneficial to test a relative affected with cancer.    Possible reasons for Kimberly [name]'s negative genetic test include:  1. There may be a gene mutation in one of these genes that current testing methods cannot detect but that chance is small.  2. There could be another gene that has not yet been discovered, or that we have not yet tested, that is responsible for the cancer diagnoses in the family.  3.  There may be no hereditary risk for cancer in the family. The cancers in Kimberly [name] and/or her family may be sporadic/familial or due to other genetic and environmental factors. 4. It is also possible there is a hereditary cause for the cancer in the family that Kimberly [name] did not inherit.  Therefore, it is recommended she follow high-risk cancer management and screening guidelines. An individual's cancer risk and medical management are not determined by genetic test results alone. Overall cancer risk assessment incorporates additional factors, including personal medical history, family history, and any available genetic information that may result in a personalized plan for cancer prevention and surveillance  Given Kimberly [name]'s family histories, we must interpret these negative results with some caution. Families with features suggestive of hereditary risk for cancer tend to have multiple family members with cancer, diagnoses in multiple generations and diagnoses before the age of 40. Kimberly [name] family exhibits some of these features. Thus, this result may simply reflect  our current inability to detect all mutations within these genes or there may be a different gene that has not yet been discovered or tested.   An individual's cancer risk and medical management are not determined by genetic test results alone. Overall cancer risk assessment incorporates additional factors, including personal medical history, family history, and any available genetic information that may result in a personalized plan for cancer prevention and surveillance.  Based on Ms. Jordahl's family of cancer, the Tyrer-Cuzick model statistical model was used to estimate her risk of developing breast cancer. This estimates her lifetime risk of developing breast to be approximately 34.24%.  The patient's lifetime breast cancer risk is a preliminary estimate based on available information using one of several models endorsed by the [employer] (NCCN).  The NCCN recommends consideration of breast MRI screening as an adjunct to mammography for patients at high risk (defined as 20% or greater lifetime risk). Please note that a woman's breast cancer risk changes over time. It may increase or decrease based on age and any changes to the personal and/or family medical  history. The risks and recommendations listed above apply to this patient at this point in time. In the future, she may or may not be eligible for the same medical management strategies and, in some cases, other medical management strategies may become available to her. If she is interested in an updated breast cancer risk assessment at a later date, she can contact us .  The Tyrer-Cuzick model is one of multiple prediction models developed to estimate an individual's lifetime risk of developing breast cancer. The Tyrer-Cuzick model is endorsed by the [employer] (NCCN). This model includes many risk factors such as family history, endogenous estrogen exposure, and benign breast disease. The  calculation is highly-dependent on the accuracy of clinical data provided by the patient and can change over time. The Tyrer-Cuzick model may be repeated to reflect new information in her personal or family history in the future.    Ms. Mcintire has been determined to be at high risk for breast cancer. her Tyrer-Cuzick risk score is 34.24%.  For women with a greater than 20% lifetime risk of breast cancer, the [employer] (NCCN) recommends the following:   1.      Clinical encounter every 6-12 months to begin when identified as being at increased risk, but not before age 57  2.      Annual mammograms. Tomosynthesis is recommended starting 10 years earlier than the youngest breast cancer diagnosis in the family or at age 68 (whichever comes first), but not before age 23    51.      Annual breast MRI starting 10 years earlier than the youngest breast cancer diagnosis in the family or at age 71 (whichever comes first), but not before age 45.    We, therefore, discussed that it is reasonable for Ms. Cummiskey to be followed by a high-risk breast cancer clinic; in addition to a yearly mammogram and physical exam by a healthcare provider, she should discuss the usefulness of an annual breast MRI with the high-risk clinic providers.  We have made a referral to the [facility] Breast [facility].  RECOMMENDATIONS FOR FAMILY MEMBERS:   Since she did not inherit a identifiable mutation in a cancer predisposition gene included on this panel, her children could not have inherited a known mutation from her in one of these genes. Individuals in this family might be at some increased risk of developing cancer, over the general population risk, simply due to the family history of cancer.  We recommended women in this family have a yearly mammogram beginning at age 44, or 10 years younger than the earliest onset of cancer, an annual clinical breast exam, and perform monthly breast self-exams.  Women in this family should also have a gynecological exam as recommended by their primary provider. All family members should be referred for colonoscopy starting at age 59, or 52 years younger than the earliest onset of cancer. It is also possible there is a hereditary cause for the cancer in Ms. Esterline's family that she did not inherit and therefore was not identified in her.    FOLLOW-UP: Lastly, we discussed with Ms. Szumski that cancer genetics is a rapidly advancing field and it is possible that new genetic tests will be appropriate for her and/or her family members in the future. We encouraged her to remain in contact with cancer genetics on an annual basis so we can update her personal and family histories and let her know of advances in cancer genetics  that may benefit this family.   Our contact number was provided. Ms. Pogue questions were answered to her satisfaction, and she knows she is welcome to call us  at anytime with additional questions or concerns.   Santana Fryer, MS, CGC  Certified Genetic Counselor  Email: [email or web address]  Phone: [phone]

## 2024-08-02 NOTE — Telephone Encounter (Signed)
 I spoke to Kimberly [name] to discuss her genetic testing results. No pathogenic variants were identified in the 70 genes analyzed. Discussed that we do not have an explanation for her family history of cancer. We reviewed that it could be due to a different gene that we are not testing, or maybe our current technology may not be able to pick something up. Additionally, we discussed that there could also be a cancer predisposition variant in the family, that she did not inherit. Therefore testing in family members could identify a genetic variant that was no identified in Kimberly [name]. It will be important for her to keep in contact with genetics to keep up with whether additional testing may be needed. Detailed clinic note to follow.  Plan: Kimberly [name] accepted a referral to the high risk breast clinic lead by Dr. Donnice Bury at the [facility] Kimberly [name] requested a copy of her results be sent to her via my chart and mail   The test report will be scanned into EPIC and will be located under the Molecular Pathology section of the Results Review tab.  A portion of the result report is included below for reference.    Santana Fryer, MS, CGC  Certified Genetic Counselor  Email: [email or web address]  Phone: [phone]

## 2024-08-07 DIAGNOSIS — F1010 Alcohol abuse, uncomplicated: Secondary | ICD-10-CM | POA: Diagnosis not present

## 2024-08-29 DIAGNOSIS — F4310 Post-traumatic stress disorder, unspecified: Secondary | ICD-10-CM | POA: Diagnosis not present

## 2024-08-29 DIAGNOSIS — F1010 Alcohol abuse, uncomplicated: Secondary | ICD-10-CM | POA: Diagnosis not present

## 2024-09-06 DIAGNOSIS — R92333 Mammographic heterogeneous density, bilateral breasts: Secondary | ICD-10-CM | POA: Diagnosis not present

## 2024-09-06 DIAGNOSIS — F17200 Nicotine dependence, unspecified, uncomplicated: Secondary | ICD-10-CM | POA: Diagnosis not present

## 2024-09-06 DIAGNOSIS — Z803 Family history of malignant neoplasm of breast: Secondary | ICD-10-CM | POA: Diagnosis not present

## 2024-09-06 DIAGNOSIS — Z9189 Other specified personal risk factors, not elsewhere classified: Secondary | ICD-10-CM | POA: Diagnosis not present

## 2024-09-09 ENCOUNTER — Ambulatory Visit: Payer: Self-pay

## 2024-09-09 DIAGNOSIS — F1010 Alcohol abuse, uncomplicated: Secondary | ICD-10-CM | POA: Diagnosis not present

## 2024-09-09 DIAGNOSIS — F4310 Post-traumatic stress disorder, unspecified: Secondary | ICD-10-CM | POA: Diagnosis not present

## 2024-09-09 NOTE — Telephone Encounter (Signed)
 FYI Only or Action Required?: Action required by provider: transferred to PAS for assistance in scheduling new patient appt.  Patient is followed in Pulmonology for not established yet  Called Nurse Triage reporting Shortness of Breath.  Symptoms began several months ago.  Interventions attempted: Nothing.  Symptoms are: gradually worsening.  Triage Disposition: See PCP Within 2 Weeks  Patient/caregiver understands and will follow disposition?: Yes  E2C2 Pulmonary Triage - Initial Assessment Questions "Chief Complaint (e.g., cough, sob, wheezing, fever, chills, sweat or additional symptoms) *Go to specific symptom protocol after initial questions. Shortness of breath with exertion, active smoker  "How long have symptoms been present?" 2 months  Have you tested for COVID or Flu? Note: If not, ask patient if a home test can be taken. If so, instruct patient to call back for positive results. No  MEDICINES:   "Have you used any OTC meds to help with symptoms?" No If yes, ask "What medications?" Patient doesn't know what to take  "Have you used your inhalers/maintenance medication?" No If yes, "What medications?" N/a  If inhaler, ask "How many puffs and how often?" Note: Review instructions on medication in the chart. N/a  OXYGEN: "Do you wear supplemental oxygen?" No If yes, "How many liters are you supposed to use?" No  "Do you monitor your oxygen levels?" No If yes, What is your reading (oxygen level) today? Educated to get pulse ox at the store  What is your usual oxygen saturation reading?  (Note: Pulmonary O2 sats should be 90% or greater) States at doctors appt, she normally runs in 90s for 02    Copied from CRM #[id number]. Topic: Clinical - Red Word Triage >> Sep 09, 2024  8:16 AM Leotis ORN wrote: [facility] that prompted transfer to Nurse Triage: patient presents with chest tightness and shortness of breath, that is exacerbated with exertion and speaking. She  states it has progressively gotten worse within the last 2 months.    ----------------------------------------------------------------------- From previous Reason for Contact - Scheduling: Patient/patient representative is calling to schedule an appointment. Refer to attachments for appointment information. Answer Assessment - Initial Assessment Questions 1. RESPIRATORY STATUS: Describe your breathing? (e.g., wheezing, shortness of breath, unable to speak, severe coughing)      Shortness of breath with heavy exertion and heavy speaking 2. ONSET: When did this breathing problem begin?      2 months 3. PATTERN Does the difficult breathing come and go, or has it been constant since it started?      Feels the SOB every day 4. SEVERITY: How bad is your breathing? (e.g., mild, moderate, severe)      Worsening  6. CARDIAC HISTORY: Do you have any history of heart disease? (e.g., heart attack, angina, bypass surgery, angioplasty)      No 7. LUNG HISTORY: Do you have any history of lung disease?  (e.g., pulmonary embolus, asthma, emphysema)     No 8. CAUSE: What do you think is causing the breathing problem?      No, she is assuming because she is current smoker that is trying to quit. Has tried to quit in the past but did not succeed 9. OTHER SYMPTOMS: Do you have any other symptoms? (e.g., chest pain, cough, dizziness, fever, runny nose)     Wheezing, coughs up chunky brown phlegm  10. O2 SATURATION MONITOR:  Do you use an oxygen saturation monitor (pulse oximeter) at home? If Yes, ask: What is your reading (oxygen level) today? What is your usual  oxygen saturation reading? (e.g., 95%)       Does not have pulse ox at home, when she is at doctor appt in past her o2 runs in 90s 11. PREGNANCY: Is there any chance you are pregnant? When was your last menstrual period?       No  Protocols used: Breathing Difficulty-A-AH

## 2024-09-13 ENCOUNTER — Ambulatory Visit (HOSPITAL_BASED_OUTPATIENT_CLINIC_OR_DEPARTMENT_OTHER): Admitting: Family Medicine

## 2024-09-13 ENCOUNTER — Encounter (HOSPITAL_BASED_OUTPATIENT_CLINIC_OR_DEPARTMENT_OTHER): Payer: Self-pay | Admitting: Family Medicine

## 2024-09-13 VITALS — BP 113/73 | HR 85 | Ht 67.0 in | Wt 161.0 lb

## 2024-09-13 DIAGNOSIS — K219 Gastro-esophageal reflux disease without esophagitis: Secondary | ICD-10-CM | POA: Diagnosis not present

## 2024-09-13 DIAGNOSIS — Z23 Encounter for immunization: Secondary | ICD-10-CM

## 2024-09-13 DIAGNOSIS — Z803 Family history of malignant neoplasm of breast: Secondary | ICD-10-CM | POA: Diagnosis not present

## 2024-09-13 DIAGNOSIS — G610 Guillain-Barre syndrome: Secondary | ICD-10-CM | POA: Diagnosis not present

## 2024-09-13 DIAGNOSIS — F411 Generalized anxiety disorder: Secondary | ICD-10-CM | POA: Diagnosis not present

## 2024-09-13 DIAGNOSIS — Z7689 Persons encountering health services in other specified circumstances: Secondary | ICD-10-CM

## 2024-09-13 MED ORDER — ALPRAZOLAM 0.5 MG PO TABS: 0.5000 mg | ORAL_TABLET | Freq: Two times a day (BID) | ORAL | 2 refills | Status: DC | PRN

## 2024-09-13 MED ORDER — PREGABALIN 200 MG PO CAPS: 200.0000 mg | ORAL_CAPSULE | Freq: Three times a day (TID) | ORAL | 2 refills | Status: DC

## 2024-09-13 MED ORDER — PANTOPRAZOLE SODIUM 40 MG PO TBEC: DELAYED_RELEASE_TABLET | ORAL | 2 refills | Status: AC

## 2024-09-13 NOTE — Progress Notes (Addendum)
 New Patient Office Visit  Subjective:   Kimberly [name] 03/26/1986 09/13/2024  Chief Complaint  Patient presents with   Acute Visit    Pt is in Rm #12 and alone. Pt here to establish a healthcare provider.  Pt state she is well and stable, no concerns.    Discussed the use of AI scribe software for clinical note transcription with the patient, who gave verbal consent to proceed.  History of Present Illness Kimberly [name] is a 38 year old female with Guillain-Barre syndrome who presents for establishment of care with new provider, medication refills and management of neuropathy symptoms.  HISTORY OF GUILLIAN BARRE:  Patient reports hx of guillian barre syndrome due to COVID 19 vaccination and viral illness with resolution.  She experiences residual symptoms from Guillain-Barre syndrome, including numbness on the tops of her feet and increased sensitivity on the bottoms. The numbness extends up her ankles and calves. She also has numbness in her fingertips, more pronounced in the left hand. The sensation is bothersome, and pregabalin  helps control these symptoms and prevents spasms.   HISTORY OF ALCOHOL  USE DISORDER:  She has a history of alcohol  use disorder but has been sober for two years and eight months. She is currently not drinking.  SMOKING CESSATION:  She smokes about a pack a day and plans to quit smoking. She experiences shortness of breath during activities such as walking and reading aloud. She has tried vaping, which she feels is worsening her symptoms. She has a family history of COPD and is scheduled to see a pulmonologist.  ATHENA:  She has a history of anxiety and uses alprazolam  for anxiety, stating she does not need the full prescribed amount. She also takes trazodone  and pantoprazole , noting that increasing the pantoprazole  to twice daily improved her symptoms.  FAMILY HX OF BREAST CANCER:  Her family history includes breast, pancreatic, and prostate  cancer. She reports that she is seeing the breast clinic and has been told she will have contrast mammograms scheduled.   Last PCP: Jerilynn Carnes MD [facility] [place])  Last annual physical: 2024  Concerns: See below    The following portions of the patient's history were reviewed and updated as appropriate: past medical history, past surgical history, family history, social history, allergies, medications, and problem list.   Patient Active Problem List   Diagnosis Date Noted   Genetic testing 07/28/2024   Family history of breast cancer    Family history of prostate cancer    Family history of pancreatic cancer    Vaginal cuff dehiscence, initial encounter 10/30/2023   Alcohol  use disorder, severe, in early remission, dependence (HCC) 10/04/2022   Rapidly progressive ascending paresthesias 12/03/2021   Folic acid  deficiency 12/03/2021   Polyneuropathy 12/02/2021   History of Guillain-Barre syndrome January 2020 12/02/2021   Stress fracture of metatarsal bone of left foot 06/17/2020   Vitamin B1 deficiency 02/12/2019   Polysubstance abuse (HCC) 02/12/2019   Neuropathy 12/14/2018   Guillain Barr syndrome 12/14/2018   History of dry beriberi January 2020 12/06/2018   Tobacco abuse 12/06/2018   Hyperglycemia 12/06/2018   IBS (irritable bowel syndrome) 12/06/2018   GERD (gastroesophageal reflux disease) 12/06/2018   AIDP (acute inflammatory demyelinating polyneuropathy) 11/19/2018   Weakness of both lower extremities 11/16/2018   Anxiety/depression 11/16/2018   Depo-Provera  contraceptive status 05/17/2013   Postpartum depression 10/09/2012   HSV-1 infection    Past Medical History:  Diagnosis Date   Abnormal Pap smear  ADD (attention deficit disorder)    Follows w/ PCP and Behavioral Health.   Alcohol  use disorder    Quit drinking 12/30/2021, follows w/ Behavioral Health.   Alcoholic polyneuropathy    hx of, now improved, no longer drinking alcohol     Anxiety    Follows w/ Behavioral Health & PCP.   Carpal tunnel syndrome of right wrist 07/01/2024   Depression    Follows w/ PCP and therapist.   Entrapment of right ulnar nerve at elbow 07/01/2024   Family history of breast cancer    Family history of pancreatic cancer    Family history of prostate cancer    Genetic testing 07/28/2024   GERD (gastroesophageal reflux disease)    takes Protonix  bid   Guillain Barr syndrome 2020   residual symptoms of feet being numb up to mid calf and left-hand numbness. Follows w/ PCP, Dr. Bertell @ [facility] in [place]. Patient ambulates without a cane or walker[place].   HSV-1 infection    Hyperglycemia 12/06/2018   Hypokalemia/hypomagnesemia 11/16/2018   Hypomagnesemia 12/03/2021   IBS (irritable bowel syndrome)    Knee hyperextension injury 2020   Left, being treated by ortho   Metabolic acidosis 12/06/2018   Pneumonia 08/2022   Covid pneumonia   Radial styloid tenosynovitis of both hands 07/01/2024   Tachycardia    Wears contact lenses    Wears glasses    Past Surgical History:  Procedure Laterality Date   BREAST BIOPSY Right    benign   CESAREAN SECTION  09/13/2012   Procedure: CESAREAN SECTION;  Surgeon: Elveria Mungo, MD;  Location: [facility];  Service: Obstetrics;  Laterality: N/A;   cortisone shot R wrist Right 2020   CYSTOSCOPY N/A 09/01/2023   Procedure: CYSTOSCOPY;  Surgeon: Glennon Almarie POUR, MD;  Location: [facility];  Service: Gynecology;  Laterality: N/A;   KYPHOPLASTY N/A 08/27/2013   Procedure: Thoracic Twelve Kyphoplasty;  Surgeon: Fairy Levels, MD;  Location: [facility];  Service: Neurosurgery;  Laterality: N/A;  T12 Kyphoplasty   LAPAROSCOPIC TUBAL LIGATION Bilateral 10/21/2019   Procedure: LAPAROSCOPIC TUBAL LIGATION WITH CAUTERY;  Surgeon: Lavoie, Marie-Lyne, MD;  Location: [facility];  Service: Gynecology;  Laterality: Bilateral;  Request to follow 2nd  case in [place] Gyn block requests one hour   LAPAROSCOPY N/A 10/30/2023   Procedure: LAPAROSCOPY DIAGNOSTIC;  Surgeon: Glennon Almarie POUR, MD;  Location: [facility];  Service: Gynecology;  Laterality: N/A;   REPAIR VAGINAL CUFF N/A 10/30/2023   Procedure: REPAIR VAGINAL CUFF;  Surgeon: Glennon Almarie POUR, MD;  Location: [facility];  Service: Gynecology;  Laterality: N/A;   ROBOTIC ASSISTED LAPAROSCOPIC HYSTERECTOMY AND SALPINGECTOMY Bilateral 09/01/2023   Procedure: XI ROBOTIC ASSISTED LAPAROSCOPIC HYSTERECTOMY AND SALPINGECTOMY;  Surgeon: Glennon Almarie POUR, MD;  Location: [facility];  Service: Gynecology;  Laterality: Bilateral;   WISDOM TOOTH EXTRACTION     x4   WRIST SURGERY Right 07/23/2013   Family History  Problem Relation Age of Onset   Breast cancer Mother 55       Lumpectomy   Depression Mother        chronic   Hearing loss Mother    Hyperlipidemia Mother    Vision loss Mother        beachets disease   Pancreatic cancer Mother 32   COPD Father    Diabetes Father    Skin cancer Father    Parkinsonism Maternal Grandmother    Heart disease Maternal Grandmother  Breast cancer Maternal Grandmother 49   Heart disease Maternal Grandfather    Alzheimer's disease Paternal Grandmother    Heart disease Paternal Grandfather        Died age 71   Nephrolithiasis Paternal Grandfather    Skin cancer Paternal Grandfather    Breast cancer Other 74 - 69   Prostate cancer Maternal Uncle    Cancer - Prostate Maternal Uncle 54 - 79   Breast cancer Maternal Aunt    Liver cancer Other    Cancer Other        Rare form of cancer, Jaidyn reported she had genetic testing unsure of cause   Social History   Socioeconomic History   Marital status: Married    Spouse name: Not on file   Number of children: 2   Years of education: Not on file   Highest education level: Some college, no degree  Occupational History   Occupation: In home health   Tobacco Use   Smoking  status: Every Day    Types: Cigarettes   Smokeless tobacco: Never   Tobacco comments:    Smokes 1/2 pack per day. Started in 1997.  Vaping Use   Vaping status: Every Day   Start date: 11/07/2018   Substances: Nicotine, Flavoring  Substance and Sexual Activity   Alcohol  use: Not Currently    Comment: No alcohol  since 2023   Drug use: Never   Sexual activity: Yes    Partners: Male    Birth control/protection: Surgical    Comment: Hyst, 1st intercourse 38 yo-More than 5 partners  Other Topics Concern   Not on file  Social History Narrative   Lives with husband and twin sons and stepson.   Social Drivers of Health   Financial Resource Strain: Medium Risk (09/09/2024)   Overall Financial Resource Strain (CARDIA)    Difficulty of Paying Living Expenses: Somewhat hard  Food Insecurity: No Food Insecurity (09/09/2024)   Hunger Vital Sign    Worried About Running Out of Food in the Last Year: Never true    Ran Out of Food in the Last Year: Never true  Transportation Needs: No Transportation Needs (09/09/2024)   PRAPARE - Administrator, Civil Service (Medical): No    Lack of Transportation (Non-Medical): No  Physical Activity: Insufficiently Active (09/09/2024)   Exercise Vital Sign    Days of Exercise per Week: 3 days    Minutes of Exercise per Session: 40 min  Stress: Stress Concern Present (09/09/2024)   [employer] of Occupational Health - Occupational Stress Questionnaire    Feeling of Stress: Rather much  Social Connections: Moderately Integrated (09/09/2024)   Social Connection and Isolation Panel    Frequency of Communication with Friends and Family: Three times a week    Frequency of Social Gatherings with Friends and Family: Twice a week    Attends Religious Services: Never    Database Administrator or Organizations: Yes    Attends Engineer, Structural: More than 4 times per year    Marital Status: Married  Catering Manager Violence: Not At Risk  (06/16/2024)   Received from [facility]   HITS    Over the last 12 months how often did your partner physically hurt you?: Never    Over the last 12 months how often did your partner insult you or talk down to you?: Never    Over the last 12 months how often did your partner threaten you with physical harm?:  Never    Over the last 12 months how often did your partner scream or curse at you?: Never   Outpatient Medications Prior to Visit  Medication Sig Dispense Refill   ibuprofen  (ADVIL ) 800 MG tablet Take 1 tablet (800 mg total) by mouth every 8 (eight) hours as needed. 30 tablet 1   traZODone  (DESYREL ) 100 MG tablet Take 100 mg by mouth at bedtime.     ALPRAZolam  (XANAX ) 0.5 MG tablet Take 0.5 mg by mouth 4 (four) times daily as needed.     pantoprazole  (PROTONIX ) 40 MG tablet Take 1 tablet (40 mg total) by mouth daily. 30 tablet 1   pregabalin  (LYRICA ) 200 MG capsule TAKE 1 CAPSULE (200 MG TOTAL) BY MOUTH 3 (THREE) TIMES DAILY. 90 capsule 2   Estradiol  (IMVEXXY  MAINTENANCE PACK) 10 MCG INST Place 1 suppository vaginally 2 (two) times a week. 8 each 5   metroNIDAZOLE  (FLAGYL ) 500 MG tablet Take 1 tablet (500 mg total) by mouth 2 (two) times daily. 14 tablet 0   No facility-administered medications prior to visit.   Allergies  Allergen Reactions   Wellbutrin  [Bupropion ] Other (See Comments)    Severe Headaches   Tape Itching and Other (See Comments)    Clear, plastic tape is the culprit    ROS: A complete ROS was performed with pertinent positives/negatives noted in the HPI. The remainder of the ROS are negative.   Objective:   Today's Vitals   09/13/24 1017  BP: 113/73  Pulse: 85  Weight: 161 lb (73 kg)  Height: 5' 7 (1.702 m)    GENERAL: Well-appearing, in NAD. Well nourished.  SKIN: Pink, warm and dry.  Head: Normocephalic. NECK: Trachea midline. Full ROM w/o pain or tenderness. RESPIRATORY: Chest wall symmetrical. Respirations even and non-labored. Breath sounds  clear to auscultation bilaterally.  CARDIAC: S1, S2 present, regular rate and rhythm without murmur or gallops. Peripheral pulses 2+ bilaterally.  MSK: Muscle tone and strength appropriate for age. EXTREMITIES: Without clubbing, cyanosis, or edema.  NEUROLOGIC: No motor or sensory deficits. Steady, even gait. C2-C12 intact.  PSYCH/MENTAL STATUS: Alert, oriented x 3. Cooperative, appropriate mood and affect.    Results for orders placed or performed in visit on 09/13/24  POCT Urine Drug Screen   Collection Time: 09/16/24 11:42 AM  Result Value Ref Range   POC Methamphetamine UR None Detected None Detected   POC Opiate Ur None Detected None Detected   POC Barbiturate UR     POC Amphetamine UR None Detected None Detected   POC Oxycodone  UR     POC Cocaine UR     POC Ecstasy UR     POC TRICYCLICS UR     POC PHENCYCLIDINE UR     POC Marijuana UR None Detected None Detected   POC Methadone UR     POC BENZODIAZEPINES UR None Detected None Detected   URINE TEMPERATURE     POC DRUG SCREEN OXIDANTS URINE     POC SPECIFIC GRAVITY URINE     POC PH URINE     Methylenedioxyamphetamine         Assessment & Plan:   1. Encounter to establish care with new doctor (Primary) Discussed patient's medical, surgical and family history.  Reviewed role of PCP with patient.  2. Guillain Barr syndrome Currently resolved but with residual neuropathy.  Currently controlled with Lyrica .  PDMP reviewed and refill provided.  Safe use of medication reviewed with patient. - pregabalin  (LYRICA ) 200 MG capsule; Take 1  capsule (200 mg total) by mouth 3 (three) times daily.  Dispense: 90 capsule; Refill: 2  3. GAD (generalized anxiety disorder) UDS is negative and CSA updated.  Alprazolam  provided refill.  PDMP reviewed and safe use reviewed with patient.  Anxiety is currently well-controlled per patient - POCT Urine Drug Screen  4. Gastroesophageal reflux disease, unspecified whether esophagitis  present Currently well-controlled, continue on current PPI therapy.  5. Immunization due Patient requesting flu vaccine.  Given history of Guillain-Barr, vaccine counseling was provided to patient with opportunities to ask questions.  Patient states she has received the flu vaccine since syndrome of Guillain-Barr and has had no adverse reactions.  She verbalizes understanding and consents to proceeding with flu vaccine. - Flu vaccine trivalent PF, 6mos and older(Flulaval,Afluria,Fluarix,Fluzone)  6. Family history of breast cancer Patient will be scheduled by high risk breast clinic for contrast mammograms.   Patient to reach out to office if new, worrisome, or unresolved symptoms arise or if no improvement in patient's condition. Patient verbalized understanding and is agreeable to treatment plan. All questions answered to patient's satisfaction.    Return in about 3 months (around 12/14/2024) for ANNUAL PHYSICAL (fasting labs at visit) .    Thersia Schuyler [place], [place]

## 2024-09-16 DIAGNOSIS — F411 Generalized anxiety disorder: Secondary | ICD-10-CM | POA: Insufficient documentation

## 2024-09-16 LAB — POCT URINE DRUG SCREEN
POC Amphetamine UR: NOT DETECTED
POC BENZODIAZEPINES UR: NOT DETECTED
POC Marijuana UR: NOT DETECTED
POC Methamphetamine UR: NOT DETECTED
POC Opiate Ur: NOT DETECTED

## 2024-09-17 DIAGNOSIS — F4310 Post-traumatic stress disorder, unspecified: Secondary | ICD-10-CM | POA: Diagnosis not present

## 2024-09-17 DIAGNOSIS — F1010 Alcohol abuse, uncomplicated: Secondary | ICD-10-CM | POA: Diagnosis not present

## 2024-09-25 ENCOUNTER — Ambulatory Visit (INDEPENDENT_AMBULATORY_CARE_PROVIDER_SITE_OTHER)

## 2024-09-25 ENCOUNTER — Ambulatory Visit: Admitting: Emergency Medicine

## 2024-09-25 ENCOUNTER — Encounter: Payer: Self-pay | Admitting: Emergency Medicine

## 2024-09-25 ENCOUNTER — Ambulatory Visit

## 2024-09-25 VITALS — BP 122/80 | HR 96 | Ht 67.0 in | Wt 161.0 lb

## 2024-09-25 DIAGNOSIS — R0602 Shortness of breath: Secondary | ICD-10-CM

## 2024-09-25 DIAGNOSIS — Z720 Tobacco use: Secondary | ICD-10-CM

## 2024-09-25 DIAGNOSIS — F1010 Alcohol abuse, uncomplicated: Secondary | ICD-10-CM | POA: Diagnosis not present

## 2024-09-25 DIAGNOSIS — F17210 Nicotine dependence, cigarettes, uncomplicated: Secondary | ICD-10-CM | POA: Diagnosis not present

## 2024-09-25 DIAGNOSIS — F4310 Post-traumatic stress disorder, unspecified: Secondary | ICD-10-CM | POA: Diagnosis not present

## 2024-09-25 DIAGNOSIS — R0600 Dyspnea, unspecified: Secondary | ICD-10-CM | POA: Diagnosis not present

## 2024-09-25 LAB — PULMONARY FUNCTION TEST
DL/VA % pred: 101 %
DL/VA: 4.37 ml/min/mmHg/L
DLCO unc % pred: 83 %
DLCO unc: 20.26 ml/min/mmHg
FEF 25-75 Post: 3.89 L/s
FEF 25-75 Pre: 3.19 L/s
FEF2575-%Change-Post: 21 %
FEF2575-%Pred-Post: 117 %
FEF2575-%Pred-Pre: 96 %
FEV1-%Change-Post: 4 %
FEV1-%Pred-Post: 90 %
FEV1-%Pred-Pre: 86 %
FEV1-Post: 2.98 L
FEV1-Pre: 2.86 L
FEV1FVC-%Change-Post: 2 %
FEV1FVC-%Pred-Pre: 101 %
FEV6-%Change-Post: 1 %
FEV6-%Pred-Post: 87 %
FEV6-%Pred-Pre: 86 %
FEV6-Post: 3.49 L
FEV6-Pre: 3.44 L
FEV6FVC-%Pred-Post: 101 %
FEV6FVC-%Pred-Pre: 101 %
FVC-%Change-Post: 1 %
FVC-%Pred-Post: 85 %
FVC-%Pred-Pre: 84 %
FVC-Post: 3.49 L
FVC-Pre: 3.44 L
Post FEV1/FVC ratio: 86 %
Post FEV6/FVC ratio: 100 %
Pre FEV1/FVC ratio: 83 %
Pre FEV6/FVC Ratio: 100 %
RV % pred: 73 %
RV: 1.27 L
TLC % pred: 88 %
TLC: 4.84 L

## 2024-09-25 NOTE — Patient Instructions (Signed)
 Full PFT completed today ? ?

## 2024-09-25 NOTE — Patient Instructions (Signed)
  VISIT SUMMARY: During your visit, we discussed your exertional shortness of breath and chronic cough, which may be related to your history of tobacco use and possible early-stage COPD. We also reviewed your history of tobacco use disorder and its potential impact on your respiratory health.  YOUR PLAN: -EXERTIONAL DYSPNEA AND CHRONIC COUGH: Your shortness of breath and chronic cough are likely due to your tobacco use. This could be related to early COPD, heart issues, or a lack of physical conditioning. We have ordered pulmonary function tests and a chest x-ray to better understand your lung health. We will review the results in your next appointment.  -POSSIBLE EARLY CHRONIC OBSTRUCTIVE PULMONARY DISEASE (COPD): COPD is a lung disease that makes it hard to breathe and is often caused by long-term exposure to irritating gases or particulate matter, most often from cigarette smoke. Given your symptoms and smoking history, we suspect early COPD. We have ordered pulmonary function tests and a chest x-ray to evaluate your lung function and will discuss the results at your follow-up visit.  -TOBACCO USE DISORDER: Tobacco use disorder is a dependence on tobacco products, which can lead to serious health issues, including respiratory problems like COPD. We discussed the importance of quitting smoking to improve your overall health and reduce respiratory symptoms.  INSTRUCTIONS: Please complete the pulmonary function tests and chest x-ray as ordered. We will review the results and discuss further management at your follow-up appointment. If you have any questions or concerns before then, please do not hesitate to contact our office.

## 2024-09-25 NOTE — Progress Notes (Signed)
 Subjective:    Patient ID: Kimberly [name], female    DOB: July 18, 1986, 38 y.o.   MRN: [id number]  HPI Discussed the use of AI scribe software for clinical note transcription with the patient, who gave verbal consent to proceed.  History of Present Illness Kimberly [name] is a 38 year old female with a history of tobacco use and possible early-stage COPD who presents with exertional shortness of breath.  She has been experiencing exertional shortness of breath for the past few months, particularly noticeable when walking to the podium in AA meetings or reading out loud. The shortness of breath worsens with physical exertion such as hiking, accompanied by chest tightness but no pain. Despite these symptoms, her exercise tolerance remains generally good, as she can walk a four-mile trail near her house. There have been no recent changes in medication or significant weight changes, though she has gained about ten pounds over time.  She has a significant history of tobacco use, having smoked since age [age] and currently smoking up to a pack a day, in addition to vaping indoors. She has been coughing up cream-colored mucus with brown swirls consistently for several years, without any blood in the mucus. In February 2023, she was informed of being in the early stages of COPD and quit drinking after that time. Since then, she has not experienced significant respiratory issues until now.  She has a history of possible Guillain-Barr syndrome associated with a COVID-19 vaccination, which resolved but left residual numbness on the tops of her feet and increased sensitivity on the soles. She also reports neuromuscular changes in the bilateral lower extremities.  She has undergone multiple cardiac evaluations in the past due to heart palpitations, including Holter monitoring and echocardiograms in 2018 and 2019, all of which were normal. No history of asthma or hearing noise when breathing. She uses an  oximeter and notes her oxygen saturation is typically in the high nineties when active but drops to around 92 when reclined.  She had surgery for carpal tunnel on her right arm a few months ago, which required anesthesia. She wonders if this could have triggered her current symptoms, though she did not notice any immediate effects post-surgery.    Results RADIOLOGY Chest CT angiogram: No evidence of pulmonary embolism; multiple scattered patchy ground-glass infiltrates bilaterally without focal consolidation; possible COVID pneumonia or pneumonitis. (08/17/2022)  DIAGNOSTIC Echocardiogram: Normal. (2019)    Review of Systems As per HPI  Past Medical History:  Diagnosis Date   Abnormal Pap smear    ADD (attention deficit disorder)    Follows w/ PCP and Behavioral Health.   Alcohol  use disorder    Quit drinking 12/30/2021, follows w/ Behavioral Health.   Alcoholic polyneuropathy    hx of, now improved, no longer drinking alcohol    Anxiety    Follows w/ Behavioral Health & PCP.   Carpal tunnel syndrome of right wrist 07/01/2024   Depression    Follows w/ PCP and therapist.   Entrapment of right ulnar nerve at elbow 07/01/2024   Family history of breast cancer    Family history of pancreatic cancer    Family history of prostate cancer    Genetic testing 07/28/2024   GERD (gastroesophageal reflux disease)    takes Protonix  bid   Guillain Barr syndrome 2020   residual symptoms of feet being numb up to mid calf and left-hand numbness. Follows w/ PCP, Dr. Bertell @ [facility] in [place]. Patient amb[place] a  cane or [place]er[place].   HSV-1 infection    Hyperglycemia 12/06/2018   Hypokalemia/hypomagnesemia 11/16/2018   Hypomagnesemia 12/03/2021   IBS (irritable bowel syndrome)    Knee hyperextension injury 2020   Left, being treated by ortho   Metabolic acidosis 12/06/2018   Pneumonia 08/2022   Covid pneumonia   Radial styloid tenosynovitis of both  hands 07/01/2024   Tachycardia    Wears contact lenses    Wears glasses     Family History  Problem Relation Age of Onset   Breast cancer Mother 26       Lumpectomy   Depression Mother        chronic   Hearing loss Mother    Hyperlipidemia Mother    Vision loss Mother        beachets disease   Pancreatic cancer Mother 36   COPD Father    Diabetes Father    Skin cancer Father    Parkinsonism Maternal Grandmother    Heart disease Maternal Grandmother    Breast cancer Maternal Grandmother 31   Heart disease Maternal Grandfather    Alzheimer's disease Paternal Grandmother    Heart disease Paternal Grandfather        Died age 62   Nephrolithiasis Paternal Grandfather    Skin cancer Paternal Grandfather    Breast cancer Other 38 - 69   Prostate cancer Maternal Uncle    Cancer - Prostate Maternal Uncle 52 - 79   Breast cancer Maternal Aunt    Liver cancer Other    Cancer Other        Rare form of cancer, Hidaya reported she had genetic testing unsure of cause     Social History   Socioeconomic History   Marital status: Married    Spouse name: Not on file   Number of children: 2   Years of education: Not on file   Highest education level: Some college, no degree  Occupational History   Occupation: In home health   Tobacco Use   Smoking status: Every Day    Current packs/day: 1.00    Average packs/day: 0.9 packs/day for 30.9 years (28.4 ttl pk-yrs)    Types: Cigarettes    Start date: 1995   Smokeless tobacco: Never   Tobacco comments:    Smokes 1/2 pack per day. Started in 1997.  Vaping Use   Vaping status: Every Day   Start date: 11/07/2018   Substances: Nicotine, Flavoring  Substance and Sexual Activity   Alcohol  use: Not Currently    Comment: No alcohol  since 2023   Drug use: Never   Sexual activity: Yes    Partners: Male    Birth control/protection: Surgical    Comment: Hyst, 1st intercourse 38 yo-More than 5 partners  Other Topics Concern   Not on file   Social History Narrative   Lives with husband and twin sons and stepson.   Social Drivers of Health   Financial Resource Strain: Medium Risk (09/09/2024)   Overall Financial Resource Strain (CARDIA)    Difficulty of Paying Living Expenses: Somewhat hard  Food Insecurity: No Food Insecurity (09/09/2024)   Hunger Vital Sign    Worried About Running Out of Food in the Last Year: Never true    Ran Out of Food in the Last Year: Never true  Transportation Needs: No Transportation Needs (09/09/2024)   PRAPARE - Administrator, Civil Service (Medical): No    Lack of Transportation (Non-Medical): No  Physical Activity: Insufficiently  Active (09/09/2024)   Exercise Vital Sign    Days of Exercise per Week: 3 days    Minutes of Exercise per Session: 40 min  Stress: Stress Concern Present (09/09/2024)   [employer] of Occupational Health - Occupational Stress Questionnaire    Feeling of Stress: Rather much  Social Connections: Moderately Integrated (09/09/2024)   Social Connection and Isolation Panel    Frequency of Communication with Friends and Family: Three times a week    Frequency of Social Gatherings with Friends and Family: Twice a week    Attends Religious Services: Never    Database Administrator or Organizations: Yes    Attends Engineer, Structural: More than 4 times per year    Marital Status: Married  Catering Manager Violence: Not At Risk (06/16/2024)   Received from [facility]   HITS    Over the last 12 months how often did your partner physically hurt you?: Never    Over the last 12 months how often did your partner insult you or talk down to you?: Never    Over the last 12 months how often did your partner threaten you with physical harm?: Never    Over the last 12 months how often did your partner scream or curse at you?: Never    Allergies  Allergen Reactions   Wellbutrin  [Bupropion ] Other (See Comments)    Severe Headaches   Tape Itching and  Other (See Comments)    Clear, plastic tape is the culprit    Current Outpatient Medications on File Prior to Visit  Medication Sig Dispense Refill   ALPRAZolam  (XANAX ) 0.5 MG tablet Take 1 tablet (0.5 mg total) by mouth 2 (two) times daily as needed for anxiety. 60 tablet 2   ibuprofen  (ADVIL ) 800 MG tablet Take 1 tablet (800 mg total) by mouth every 8 (eight) hours as needed. 30 tablet 1   pantoprazole  (PROTONIX ) 40 MG tablet Take 2 tablets (80 mg total) by mouth daily for 30 days, THEN 1 tablet (40 mg total) daily. 90 tablet 2   pregabalin  (LYRICA ) 200 MG capsule Take 1 capsule (200 mg total) by mouth 3 (three) times daily. 90 capsule 2   traZODone  (DESYREL ) 100 MG tablet Take 100 mg by mouth at bedtime.     No current facility-administered medications on file prior to visit.       Objective:    Vitals:   09/25/24 0834  BP: 122/80  Pulse: 96  SpO2: 97%  Weight: 161 lb (73 kg)  Height: 5' 7 (1.702 m)   Physical Exam Gen: Pleasant, well-nourished, in no distress,  normal affect  ENT: No lesions,  mouth clear,  oropharynx clear, no postnasal drip  Neck: No JVD, no stridor  Lungs: No use of accessory muscles, no crackles or wheezing on normal respiration, no wheeze on forced expiration  Cardiovascular: RRR, heart sounds normal, no murmur or gallops, no peripheral edema  Musculoskeletal: No deformities, no cyanosis or clubbing  Neuro: alert, awake, non focal  Skin: Warm, no lesions or rashes       Assessment & Plan:   Assessment & Plan   Assessment and Plan Assessment & Plan Exertional dyspnea and chronic cough Exertional dyspnea and chronic cough likely due to tobacco use. Differential includes early COPD, cardiac issues, or deconditioning. Good exercise tolerance noted. - Ordered pulmonary function tests to assess airflow and evidence of COPD. - Ordered chest x-ray to evaluate current lung status. - Scheduled follow-up appointment to  review test results and  discuss further management.  Possible early chronic obstructive pulmonary disease (COPD) Possible early COPD suggested by chronic cough, mucus production, and exertional dyspnea. History of tobacco use since age 52, currently smoking a pack a day and vaping with nicotine. Previous diagnosis of early COPD in 2023. - Ordered pulmonary function tests to assess airflow and evidence of COPD. - Ordered chest x-ray to evaluate current lung status. - Scheduled follow-up appointment to review test results and discuss further management.  Tobacco use disorder Long-standing tobacco use disorder with smoking since age 14 and current use of cigarettes and vaping with nicotine. Smoking history may contribute to respiratory symptoms and potential COPD development.   No follow-ups on file.    Lamar Chris, MD, PhD 09/25/2024, 9:02 AM [facility] [phone] or if no answer before 7:00PM call [phone] For any issues after 7:00PM please call eLink [phone]

## 2024-09-25 NOTE — Progress Notes (Signed)
 Full PFT completed today ? ?

## 2024-10-02 ENCOUNTER — Encounter: Payer: Self-pay | Admitting: Emergency Medicine

## 2024-10-02 ENCOUNTER — Ambulatory Visit: Admitting: Emergency Medicine

## 2024-10-02 VITALS — BP 122/72 | HR 92 | Temp 98.3°F | Ht 67.0 in | Wt 160.2 lb

## 2024-10-02 DIAGNOSIS — Z720 Tobacco use: Secondary | ICD-10-CM

## 2024-10-02 DIAGNOSIS — J449 Chronic obstructive pulmonary disease, unspecified: Secondary | ICD-10-CM | POA: Diagnosis not present

## 2024-10-02 MED ORDER — ALBUTEROL SULFATE HFA 108 (90 BASE) MCG/ACT IN AERS: 2.0000 | INHALATION_SPRAY | Freq: Four times a day (QID) | RESPIRATORY_TRACT | 3 refills | Status: AC | PRN

## 2024-10-02 NOTE — Assessment & Plan Note (Addendum)
 We talked in detail today about strategies to cut down and ultimately quit your cigarette smoking. We agreed that you would get down to less than 10 cigarettes daily by 10/31/2024.

## 2024-10-02 NOTE — Progress Notes (Signed)
 Subjective:    Patient ID: Kimberly [name], female    DOB: 11-20-1985, 38 y.o.   MRN: [id number]  HPI  History of Present Illness   ROV 10/02/2024 --follow-up visit 38 year old woman with a history of tobacco use, possible Guillain-Barr syndrome that was associated with her COVID-19 vaccine but which resolved, whom I saw 11/19 for evaluation of cough, exertional shortness of breath and history of tobacco use.  She underwent pulmonary function testing, chest x-ray as below.  She has cut down to less than 1 pk/day. She also vapes - notices cough w this, enough to cause some pain. She is interested in quitting. We discussed this in detail today. We agreed that she would get down to < 10 cig by 12/25.  She can still get some exertional SOB. She has dry cough, often when she has vape.    Pulmonary function testing 09/25/2024 reviewed by me, shows grossly normal airflows without a bronchodilator response, normal lung volumes with the exception of a slightly decreased RV, normal diffusion capacity, normal flow-volume loop.  Chest x-ray performed 09/25/2024 and reviewed by me showed no infiltrates, effusions, or any other abnormality.  Normal study.  Results RADIOLOGY Chest CT angiogram: No evidence of pulmonary embolism; multiple scattered patchy ground-glass infiltrates bilaterally without focal consolidation; possible COVID pneumonia or pneumonitis. (08/17/2022)  DIAGNOSTIC Echocardiogram: Normal. (2019)    Review of Systems As per HPI       Objective:    Vitals:   10/02/24 0828  BP: 122/72  Pulse: 92  Temp: 98.3 F (36.8 C)  SpO2: 99%  Weight: 160 lb 3.2 oz (72.7 kg)  Height: 5' 7 (1.702 m)   Physical Exam Gen: Pleasant, well-nourished, in no distress,  normal affect  ENT: No lesions,  mouth clear,  oropharynx clear, no postnasal drip  Neck: No JVD, no stridor  Lungs: No use of accessory muscles, no crackles or wheezing on normal respiration, no wheeze on forced  expiration  Cardiovascular: RRR, heart sounds normal, no murmur or gallops, no peripheral edema  Musculoskeletal: No deformities, no cyanosis or clubbing  Neuro: alert, awake, non focal  Skin: Warm, no lesions or rashes       Assessment & Plan:   Assessment & Plan Tobacco use [Z72.0]  Chronic obstructive pulmonary disease, unspecified COPD type (HCC) Reviewed her pulmonary function testing with her today.  Reassuring airflows and reassuring chest x-ray.  Minimal symptoms.  At this point I do not think she needs to be on a scheduled BD but I do think we need to work hard on smoking cessation.  We discussed in detail today with appropriate counseling as below.  We reviewed your pulmonary function testing and your chest x-ray today.  Good news these both appear normal. Do suspect that you have a component of subclinical (very early) COPD. We will send an albuterol  inhaler to your pharmacy.  You can use 2 puffs up to every 4 hours if needed for shortness of breath, chest tightness, wheezing.  Also consider treating with 2 puffs prior to significant exercise. Follow in our office in 6 months, sooner if you have any problems. Tobacco abuse We talked in detail today about strategies to cut down and ultimately quit your cigarette smoking. We agreed that you would get down to less than 10 cigarettes daily by 10/31/2024.    Return in about 6 months (around 04/01/2025) for With Dr. Shelah.  Smoking/Tobacco Cessation Counseling Kimberly [name] is a current user of tobacco or  nicotine products. She is considering quitting at this time. Counseling provided today addressed the risks of continued use and the benefits of cessation. Discussed tobacco/nicotine use history, readiness to quit, and evidence-based treatment options including behavioral strategies, support resources, and pharmacologic therapies. Provided encouragement and educational materials on steps and resources to quit smoking.  Patient questions were addressed, and follow-up recommended for continued support. Total time spent on counseling: 12 minutes.    I personally spent a total of 37 minutes in the care of the patient today including preparing to see the patient, getting/reviewing separately obtained history, performing a medically appropriate exam/evaluation, counseling and educating, placing orders, documenting clinical information in the EHR, independently interpreting results, and communicating results.   Lamar Chris, MD, PhD 10/02/2024, 4:43 PM [facility] [phone] or if no answer before 7:00PM call [phone] For any issues after 7:00PM please call eLink [phone]

## 2024-10-02 NOTE — Patient Instructions (Signed)
 We reviewed your pulmonary function testing and your chest x-ray today.  Good news these both appear normal. Do suspect that you have a component of subclinical (very early) COPD. We talked in detail today about strategies to cut down and ultimately quit your cigarette smoking. We agreed that you would get down to less than 10 cigarettes daily by 10/31/2024. We will send an albuterol  inhaler to your pharmacy.  You can use 2 puffs up to every 4 hours if needed for shortness of breath, chest tightness, wheezing.  Also consider treating with 2 puffs prior to significant exercise. Follow in our office in 6 months, sooner if you have any problems.

## 2024-10-02 NOTE — Assessment & Plan Note (Addendum)
 Reviewed her pulmonary function testing with her today.  Reassuring airflows and reassuring chest x-ray.  Minimal symptoms.  At this point I do not think she needs to be on a scheduled BD but I do think we need to work hard on smoking cessation.  We discussed in detail today with appropriate counseling as below.  We reviewed your pulmonary function testing and your chest x-ray today.  Good news these both appear normal. Do suspect that you have a component of subclinical (very early) COPD. We will send an albuterol  inhaler to your pharmacy.  You can use 2 puffs up to every 4 hours if needed for shortness of breath, chest tightness, wheezing.  Also consider treating with 2 puffs prior to significant exercise. Follow in our office in 6 months, sooner if you have any problems.

## 2024-10-10 ENCOUNTER — Ambulatory Visit: Admitting: Emergency Medicine

## 2024-10-10 DIAGNOSIS — F4310 Post-traumatic stress disorder, unspecified: Secondary | ICD-10-CM | POA: Diagnosis not present

## 2024-10-10 DIAGNOSIS — F1010 Alcohol abuse, uncomplicated: Secondary | ICD-10-CM | POA: Diagnosis not present

## 2024-10-14 DIAGNOSIS — M19031 Primary osteoarthritis, right wrist: Secondary | ICD-10-CM | POA: Diagnosis not present

## 2024-10-17 DIAGNOSIS — F1010 Alcohol abuse, uncomplicated: Secondary | ICD-10-CM | POA: Diagnosis not present

## 2024-10-17 DIAGNOSIS — F4310 Post-traumatic stress disorder, unspecified: Secondary | ICD-10-CM | POA: Diagnosis not present

## 2024-10-21 ENCOUNTER — Other Ambulatory Visit: Payer: Self-pay | Admitting: Nurse Practitioner

## 2024-10-21 DIAGNOSIS — Z803 Family history of malignant neoplasm of breast: Secondary | ICD-10-CM

## 2024-10-28 ENCOUNTER — Encounter (HOSPITAL_BASED_OUTPATIENT_CLINIC_OR_DEPARTMENT_OTHER)

## 2024-11-18 ENCOUNTER — Other Ambulatory Visit: Payer: Self-pay | Admitting: Nurse Practitioner

## 2024-11-18 DIAGNOSIS — Z9189 Other specified personal risk factors, not elsewhere classified: Secondary | ICD-10-CM

## 2024-11-18 DIAGNOSIS — Z803 Family history of malignant neoplasm of breast: Secondary | ICD-10-CM

## 2024-12-12 ENCOUNTER — Other Ambulatory Visit (HOSPITAL_BASED_OUTPATIENT_CLINIC_OR_DEPARTMENT_OTHER): Payer: Self-pay | Admitting: Family Medicine

## 2024-12-12 DIAGNOSIS — G610 Guillain-Barre syndrome: Secondary | ICD-10-CM

## 2024-12-26 ENCOUNTER — Encounter (HOSPITAL_BASED_OUTPATIENT_CLINIC_OR_DEPARTMENT_OTHER): Admitting: Family Medicine
